# Patient Record
Sex: Male | Born: 1960
Health system: Southern US, Community
[De-identification: ages and names within clinical notes are randomized; demographics above are authoritative.]

## PROBLEM LIST (undated history)

## (undated) DIAGNOSIS — D126 Benign neoplasm of colon, unspecified: Secondary | ICD-10-CM

## (undated) DIAGNOSIS — I10 Essential (primary) hypertension: Secondary | ICD-10-CM

## (undated) DIAGNOSIS — Z8619 Personal history of other infectious and parasitic diseases: Secondary | ICD-10-CM

## (undated) DIAGNOSIS — IMO0001 Reserved for inherently not codable concepts without codable children: Secondary | ICD-10-CM

## (undated) DIAGNOSIS — K648 Other hemorrhoids: Secondary | ICD-10-CM

## (undated) DIAGNOSIS — F4323 Adjustment disorder with mixed anxiety and depressed mood: Secondary | ICD-10-CM

## (undated) DIAGNOSIS — R030 Elevated blood-pressure reading, without diagnosis of hypertension: Secondary | ICD-10-CM

## (undated) DIAGNOSIS — E7800 Pure hypercholesterolemia, unspecified: Secondary | ICD-10-CM

## (undated) DIAGNOSIS — M1990 Unspecified osteoarthritis, unspecified site: Secondary | ICD-10-CM

## (undated) HISTORY — PX: NASAL SEPTUM SURGERY: SHX37

## (undated) HISTORY — DX: Adjustment disorder with mixed anxiety and depressed mood: F43.23

## (undated) HISTORY — DX: Unspecified osteoarthritis, unspecified site: M19.90

## (undated) HISTORY — DX: Reserved for inherently not codable concepts without codable children: IMO0001

## (undated) HISTORY — DX: Benign neoplasm of colon, unspecified: D12.6

## (undated) HISTORY — DX: Elevated blood-pressure reading, without diagnosis of hypertension: R03.0

## (undated) HISTORY — DX: Personal history of other infectious and parasitic diseases: Z86.19

## (undated) HISTORY — DX: Other hemorrhoids: K64.8

## (undated) HISTORY — PX: ELBOW SURGERY: SHX618

## (undated) HISTORY — DX: Pure hypercholesterolemia, unspecified: E78.00

## (undated) HISTORY — PX: KNEE SURGERY: SHX244

## (undated) HISTORY — PX: PARTIAL HIP ARTHROPLASTY: SHX733

---

## 2009-11-23 ENCOUNTER — Telehealth: Payer: Self-pay | Admitting: Internal Medicine

## 2009-11-24 ENCOUNTER — Ambulatory Visit: Payer: Self-pay | Admitting: Internal Medicine

## 2009-11-24 DIAGNOSIS — E785 Hyperlipidemia, unspecified: Secondary | ICD-10-CM | POA: Insufficient documentation

## 2009-11-24 DIAGNOSIS — I10 Essential (primary) hypertension: Secondary | ICD-10-CM | POA: Insufficient documentation

## 2009-11-24 DIAGNOSIS — E119 Type 2 diabetes mellitus without complications: Secondary | ICD-10-CM

## 2009-11-24 DIAGNOSIS — Z794 Long term (current) use of insulin: Secondary | ICD-10-CM | POA: Insufficient documentation

## 2009-11-24 DIAGNOSIS — F4322 Adjustment disorder with anxiety: Secondary | ICD-10-CM | POA: Insufficient documentation

## 2009-11-24 DIAGNOSIS — G4700 Insomnia, unspecified: Secondary | ICD-10-CM | POA: Insufficient documentation

## 2009-11-24 LAB — CONVERTED CEMR LAB
Albumin: 4.7 g/dL (ref 3.5–5.2)
BUN: 14 mg/dL (ref 6–23)
CO2: 22 meq/L (ref 19–32)
Calcium: 9.6 mg/dL (ref 8.4–10.5)
Creatinine, Ser: 0.69 mg/dL (ref 0.40–1.50)
Glucose, Bld: 276 mg/dL — ABNORMAL HIGH (ref 70–99)
Hgb A1c MFr Bld: 11.5 % — ABNORMAL HIGH (ref <5.7)
Microalb, Ur: 3.02 mg/dL — ABNORMAL HIGH (ref 0.00–1.89)
TSH: 1.581 microintl units/mL (ref 0.350–4.500)
Total Bilirubin: 0.4 mg/dL (ref 0.3–1.2)
Total Protein: 7.1 g/dL (ref 6.0–8.3)

## 2009-11-24 LAB — HM DIABETES FOOT EXAM

## 2009-11-27 ENCOUNTER — Telehealth: Payer: Self-pay | Admitting: Internal Medicine

## 2009-11-28 ENCOUNTER — Encounter: Payer: Self-pay | Admitting: Internal Medicine

## 2009-11-28 LAB — CONVERTED CEMR LAB
Cholesterol: 168 mg/dL (ref 0–200)
VLDL: 50 mg/dL — ABNORMAL HIGH (ref 0–40)

## 2009-12-04 ENCOUNTER — Telehealth: Payer: Self-pay | Admitting: Internal Medicine

## 2009-12-05 ENCOUNTER — Telehealth: Payer: Self-pay | Admitting: Internal Medicine

## 2009-12-08 ENCOUNTER — Ambulatory Visit: Payer: Self-pay | Admitting: Internal Medicine

## 2009-12-08 ENCOUNTER — Telehealth: Payer: Self-pay | Admitting: Internal Medicine

## 2009-12-11 ENCOUNTER — Encounter: Payer: Self-pay | Admitting: Internal Medicine

## 2009-12-11 ENCOUNTER — Telehealth: Payer: Self-pay | Admitting: Internal Medicine

## 2009-12-13 ENCOUNTER — Encounter: Payer: Self-pay | Admitting: Internal Medicine

## 2009-12-18 ENCOUNTER — Ambulatory Visit: Payer: Self-pay | Admitting: Internal Medicine

## 2010-01-02 ENCOUNTER — Telehealth: Payer: Self-pay | Admitting: Internal Medicine

## 2010-01-04 ENCOUNTER — Encounter: Payer: Self-pay | Admitting: Internal Medicine

## 2010-01-05 ENCOUNTER — Ambulatory Visit: Payer: Self-pay | Admitting: Internal Medicine

## 2010-01-05 LAB — CONVERTED CEMR LAB
Chloride: 103 meq/L (ref 96–112)
Creatinine, Ser: 0.77 mg/dL (ref 0.40–1.50)
Creatinine, Urine: 22 mg/dL
Microalb Creat Ratio: 22.7 mg/g (ref 0.0–30.0)
Potassium: 4.4 meq/L (ref 3.5–5.3)
Sodium: 140 meq/L (ref 135–145)

## 2010-01-07 ENCOUNTER — Encounter: Payer: Self-pay | Admitting: Internal Medicine

## 2010-01-08 ENCOUNTER — Telehealth: Payer: Self-pay | Admitting: Internal Medicine

## 2010-02-07 ENCOUNTER — Telehealth: Payer: Self-pay | Admitting: Internal Medicine

## 2010-02-15 ENCOUNTER — Telehealth: Payer: Self-pay | Admitting: Internal Medicine

## 2010-02-16 ENCOUNTER — Ambulatory Visit
Admission: RE | Admit: 2010-02-16 | Discharge: 2010-02-16 | Payer: Self-pay | Source: Home / Self Care | Attending: Internal Medicine | Admitting: Internal Medicine

## 2010-03-06 ENCOUNTER — Telehealth: Payer: Self-pay | Admitting: Internal Medicine

## 2010-03-06 NOTE — Progress Notes (Signed)
Summary: Carbohyrdrate intake  Phone Note Call from Patient Call back at Home Phone [phone]   Caller: Patient Call For: [name] DO Summary of Call: patient called and left voice message wanting to know what his reccomended carbohydrate intake should be  Initial call taken by: Glendell Docker CMA,  November 27, 2009 2:38 PM  Follow-up for Phone Call        25 grams per meal.  If he follows this new diet,  I suggest he hold glipizide and cut his lantus dose in half Follow-up by: [name] DO,  November 27, 2009 2:53 PM  Additional Follow-up for Phone Call Additional follow up Details #1::        call returned to patient at [phone], he has been advised per Dr Artist Pais instructions. He states that he may have trouble maintaining 25 grams per meal. Are there any other suggestions. Right now  the average per meal is between 30 and 40 Additional Follow-up by: Glendell Docker CMA,  November 27, 2009 3:24 PM    Additional Follow-up for Phone Call Additional follow up Details #2::    ok for carb intake up to 30 grams per meal Follow-up by: [name] DO,  November 27, 2009 5:09 PM  Additional Follow-up for Phone Call Additional follow up Details #3:: Details for Additional Follow-up Action Taken: call returned to patient at [phone], no asnwer. A detailed voice message was left informing patient per Dr Artist Pais instructions. Message left for patient to call if any additional questions. Additional Follow-up by: Glendell Docker CMA,  November 28, 2009 8:20 AM  New/Updated Medications: LANTUS 100 UNIT/ML SOLN (INSULIN GLARGINE) inject 25 units subcutaneously every morning. GLIPIZIDE XL 10 MG XR24H-TAB (GLIPIZIDE) Take 1 tablet by mouth once a day ( on hold 11/27/2009)
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
## 2010-03-06 NOTE — Progress Notes (Signed)
Summary: rx for Apidra vials   Phone Note Refill Request Message from:  Pharmacy on January 02, 2010 8:25 AM  Refills Requested: Medication #1:  APIDRA SOLOSTAR 100 UNIT/ML SOLN use 5-15 units three times a day before meals   Dosage confirmed as above?Dosage Confirmed   Brand Name Necessary? No   Supply Requested: 1 month pharmacy is requesting apidra vial the solostar is on back order patient can get vials for free    Method Requested: Electronic Next Appointment Scheduled: 01-05-10 Dr Artist Pais  Initial call taken by: Roselle Locus,  January 02, 2010 8:26 AM Caller: Patient Call For: [name] DO  Follow-up for Phone Call        ok to change rx to apidra vials plz send rx for insulin syringes for three times a day use as well Follow-up by: [name] DO,  January 03, 2010 11:30 AM  Additional Follow-up for Phone Call Additional follow up Details #1::        Notified pt. He requests that a rx for Apidra solostar be kept on file at [facility] in case it ever comes off of back order. Spoke to  [place] the pharmacist at [facility] and gave auth to change to the vials. He states they will keep Solostar rx on file but they may need to contact to [place] for verbal auth if  back order is resolved in the future. Nicki Guadalajara Fergerson CMA Duncan Dull)  January 03, 2010 11:44 AM      Appended Document: rx for Apidra vials  Per Matt at [facility], pt would use 1 vial every 22 days if he took the maximum units prescribed.

## 2010-03-06 NOTE — Progress Notes (Signed)
Summary: Insulin questions  Phone Note Call from Patient Call back at Home Phone [phone]   Caller: Patient Call For: [name] DO Summary of Call: patient called and left voice message stating he has been experimenting with this Lantus and Apidra. He is requesting a return call.  Initial call taken by: Glendell Docker CMA,  December 11, 2009 12:02 PM  Follow-up for Phone Call        patient states that he has been using the playing with the dosing on his Lantus and Apidra and wanted to know what his blood sugars should be. He state right now low 99 high of 245. He would also like to know if he could have refills on his Metformin and Simvastatin Follow-up by: Glendell Docker CMA,  December 11, 2009 2:14 PM  Additional Follow-up for Phone Call Additional follow up Details #1::        see rx for metformin I suggest he make appt next week if he has questions re:  insulin use I suggest he bring glucomter and food log Additional Follow-up by: [name] DO,  December 11, 2009 4:13 PM    Additional Follow-up for Phone Call Additional follow up Details #2::    call returned to patient at [phone], he has been advised per Dr Artist Pais instructions. Appointment scheduled for Monday 11/14 @ 11am Follow-up by: Glendell Docker CMA,  December 11, 2009 4:51 PM  Prescriptions: METFORMIN HCL 500 MG XR24H-TAB (METFORMIN HCL) 2 tabs by mouth once daily  #60 x 5   Entered and Authorized by:   [name] DO   Signed by:   [name] DO on 12/11/2009   Method used:   Electronically to        [facility] (retail)       [address]       [place], [place]  [address]       Ph: [phone]       Fax: [phone]   RxID:   [id number]
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
## 2010-03-06 NOTE — Letter (Signed)
Summary: [facility] for Medical Supplies/[facility]  [facility]N for Medical Supplies/[facility]   Imported By: Lanelle Bal 12/22/2009 11:26:58  _____________________________________________________________________  External Attachment:    Type:   Image     Comment:   External Document

## 2010-03-06 NOTE — Progress Notes (Signed)
Summary: FAXED REQUEST FOR RECORDS   Phone Note Outgoing Call   Call placed by: [facility] Call placed to: [facility]  Summary of Call: FAXED REQUEST FOR RECORDS  Initial call taken by: Roselle Locus,  November 23, 2009 11:17 AM
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
## 2010-03-06 NOTE — Letter (Signed)
   [place] [facility] [address] [place], [place]  [address]  [place] Phone: [phone]      January 07, 2010   Hitoshi Althaus [address] [place], [place] [address]  RE:  LAB RESULTS  Dear  Mr. INABINET,  The following is an interpretation of your most recent lab tests.  Please take note of any instructions provided or changes to medications that have resulted from your lab work.  ELECTROLYTES:  Good - no changes needed  KIDNEY FUNCTION TESTS:  Good - no changes needed    DIABETIC STUDIES:  Improved - continue management Blood Glucose: 164   HgbA1C: 8.2   Microalbumin/Creatinine Ratio: 22.7          Sincerely Yours,    Dr. Thomos Lemons  Appended Document:  mailed
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
## 2010-03-06 NOTE — Progress Notes (Signed)
Summary: Apidra Refill  Phone Note Refill Request Message from:  Fax from Pharmacy on January 08, 2010 8:20 AM  Refills Requested: Medication #1:  apidra u 100 inj sano patient needs 2 vials 1 vial only last 22 days please re send stating 2 vials is ok to dispense  [facility] [address] [place] [address] fax [phone]   Method Requested: Electronic Next Appointment Scheduled: 01-08-2010 Dr Artist Pais  Initial call taken by: Roselle Locus,  January 08, 2010 8:21 AM  Follow-up for Phone Call        call placed to patient at [phone], no answer. A voice message was left for patient to return call with  the number of units of Apidra he is currently injecting.  Follow-up by: Glendell Docker CMA,  January 08, 2010 9:35 AM  Additional Follow-up for Phone Call Additional follow up Details #1::        Pt returned call and states that some times he takes a max of 45 units a day. States the pharmacist originally calculated dosage wrong and did not dispense enough vials. Spoke to [place] [facility] and she states 1 vial will last pt 22 days. Gave auth to dispense #2 vials. Nicki Guadalajara Fergerson CMA Duncan Dull)  January 08, 2010 10:33 AM

## 2010-03-06 NOTE — Progress Notes (Signed)
Summary: Medication Refills  Phone Note From Pharmacy   Caller: [facility] [phone] Summary of Call: Matt called stating patient is requesting a rx for Ramipril and Simvastatin. He states they do not have rx's on file for this patient Initial call taken by: Glendell Docker CMA,  December 08, 2009 4:28 PM  Follow-up for Phone Call        Phone call completed Follow-up by: Glendell Docker CMA,  December 11, 2009 4:48 PM    New/Updated Medications: SIMVASTATIN 40 MG TABS (SIMVASTATIN) one by mouth q pm Prescriptions: SIMVASTATIN 40 MG TABS (SIMVASTATIN) one by mouth q pm  #30 x 5   Entered and Authorized by:   [name] DO   Signed by:   [name] DO on 12/08/2009   Method used:   Electronically to        [facility] (retail)       [address]       [place], [place]  [address]       Ph: [phone]       Fax: [phone]   RxID:   [id number] RAMIPRIL 10 MG CAPS (RAMIPRIL) Take 2 tablets daily.  #60 x 5   Entered and Authorized by:   [name] DO   Signed by:   [name] DO on 12/08/2009   Method used:   Electronically to        [facility] (retail)       [address]       [place], [place]  [address]       Ph: [phone]       Fax: [phone]   RxID:   [id number]

## 2010-03-06 NOTE — Progress Notes (Signed)
Summary: William [name]  Phone Note Call from Patient Call back at [phone]   Caller: Patient Call For: [name] DO Summary of Call: Received call from pt requesting a prescription be sent to [facility] for a OneTouch UltraMini [name]. Pt has a voucher that will get him the [name] for free. Pt states this one is smaller than his current OneTouch [name] and will be better to use when traveling. Pt states he does not need glucometer strips at this time. Please let pt know when Rx has been approved.  Nicki Guadalajara Fergerson CMA Duncan Dull)  January 02, 2010 8:35 AM   Follow-up for Phone Call        ok to send rx Follow-up by: [name] DO,  January 02, 2010 5:11 PM  Additional Follow-up for Phone Call Additional follow up Details #1::        How many times a day should pt check his blood sugar? Nicki Guadalajara Fergerson CMA (AAMA)  January 03, 2010 8:16 AM   up to 4 x daily [name] DO  January 03, 2010 11:29 AM     Additional Follow-up for Phone Call Additional follow up Details #2::    Rx faxed to pharmacy. Pt has been notified. Nicki Guadalajara Fergerson CMA Duncan Dull)  January 03, 2010 11:38 AM   New/Updated Medications: * ONETOUCH ULTRA MINI GLUCOMETER Check blood sugar up to 4 times a day. Prescriptions: ONETOUCH ULTRA MINI GLUCOMETER Check blood sugar up to 4 times a day.  #1 kit x 0   Entered by:   Mervin Kung CMA (AAMA)   Authorized by:   [name] DO   Signed by:   Mervin Kung CMA (AAMA) on 01/03/2010   Method used:   Faxed to ...       [facility] (retail)       [address]       [place], [place]  [address]       Ph: [phone]       Fax: [phone]   RxID:   [id number]

## 2010-03-06 NOTE — Progress Notes (Signed)
Summary: records rec from [facility]   Phone Note Other Incoming   Caller: [facility]  Summary of Call: records rec from [facility]  Initial call taken by: Roselle Locus,  December 05, 2009 10:07 AM

## 2010-03-06 NOTE — Letter (Signed)
Summary: Glucose Log from Patient  Glucose Log from Patient   Imported By: Lanelle Bal 12/20/2009 09:39:08  _____________________________________________________________________  External Attachment:    Type:   Image     Comment:   External Document

## 2010-03-08 NOTE — Assessment & Plan Note (Signed)
Summary: 6 week follow up/mhf   Vital Signs:  Patient profile:   50 year old male Height:      72 inches Weight:      217 pounds BMI:     29.54 O2 Sat:      97 % on Room air Temp:     97.8 degrees F oral Pulse rate:   86 / minute Resp:     18 per minute BP sitting:   110 / 68  (right arm) Cuff size:   large  Vitals Entered By: Glendell Docker CMA (February 16, 2010 3:08 PM)  O2 Flow:  Room air CC: 6  Week Follow up  Is Patient Diabetic? Yes Did you bring your meter with you today? No Pain Assessment Patient in pain? no      Comments would like an increase on Bupropian, refill on Alprazolam, discuss pain in left arm for the past month   Primary Care Provider:  Dondra Spry DO  CC:  6  Week Follow up .  History of Present Illness: 50 y/o white male f/u pt feels like buproprion helping with stress level he has taken x 1 month wife had noticed pt less intense  DM II - somewhat labile blood sugars.  pt occ using shorting acting insulin after meals   Preventive Screening-Counseling & Management  Alcohol-Tobacco     Smoking Status: never  Allergies (verified): No Known Drug Allergies  Past History:  Past Medical History: Hx or Asthma Arthritis Hx of chicken pox     Adjustment disorder depression/anxiety DM II Hx of High Blood pressure readings Hypercholesterolemia  Physical Exam  General:  alert, well-developed, and well-nourished.   Lungs:  normal respiratory effort, normal breath sounds, no crackles, and no wheezes.   Heart:  normal rate, regular rhythm, no murmur, and no gallop.   Psych:  normally interactive and good eye contact.     Impression & Recommendations:  Problem # 1:  ADJUSTMENT DISORDER WITH ANXIOUS MOOD (ICD-309.24) Assessment Improved continue wellburtrin - good response continue alprazolam as needed  Problem # 2:  DIABETES-TYPE 2 (ICD-250.00) Assessment: Deteriorated pt advised not to use apidra after meals to "chase" elevated  CBG.  avoid hypoglycemia  His updated medication list for this problem includes:    Lantus Solostar 100 Unit/ml Soln (Insulin glargine) ..... Use 35 units once daily    Ramipril 10 Mg Caps (Ramipril) .[name]... Take 2 tablets daily.    Metformin Hcl 500 Mg Xr24h-tab (Metformin hcl) .[name]... 2 tabs by mouth in am , then 1 tab pm    Apidra Solostar 100 Unit/ml Soln (Insulin glulisine) ..... Use 10-15 units three times a day before meals  Complete Medication List: 1)  Lantus Solostar 100 Unit/ml Soln (Insulin glargine) .... Use 35 units once daily 2)  Ramipril 10 Mg Caps (Ramipril) .... Take 2 tablets daily. 3)  Alprazolam 1 Mg Tabs (Alprazolam) .... Take 1 tablet by mouth at bedtime as needed 4)  Simvastatin 40 Mg Tabs (Simvastatin) .... One by mouth q pm 5)  Metformin Hcl 500 Mg Xr24h-tab (Metformin hcl) .... 2 tabs by mouth in am , then 1 tab pm 6)  Apidra Solostar 100 Unit/ml Soln (Insulin glulisine) .... Use 10-15 units three times a day before meals 7)  Relion Pen Needles 31g X 8 Mm Misc (Insulin pen needle) .... Use 4 x daily as directed 8)  Onetouch Ultra Mini Glucometer  .... Check blood sugar up to 4 times a day. 9)  Bupropion Hcl 150 Mg Xr24h-tab (Bupropion hcl) .... One by mouth once daily  Patient Instructions: 1)  Please schedule a follow-up appointment in 2 months. 2)  BMP prior to visit, ICD-9:  250.02 3)  HbgA1C prior to visit, ICD-9: 250.02 4)  Please return for lab work one (1) week before your next appointment.  Prescriptions: ALPRAZOLAM 1 MG TABS (ALPRAZOLAM) Take 1 tablet by mouth at bedtime as needed  #30 x 2   Entered and Authorized by:   [name] DO   Signed by:   [name] DO on 02/16/2010   Method used:   Print then Give to Patient   RxID:   [id number]    Orders Added: 1)  Est. Patient Level III [53664]    Current Allergies (reviewed today): No known allergies

## 2010-03-08 NOTE — Progress Notes (Signed)
Summary: Alprazolam Refill  Phone Note Refill Request Message from:  Pharmacy on February 07, 2010 11:47 AM  Refills Requested: Medication #1:  ALPRAZOLAM 1 MG TABS Take 1 tablet by mouth at bedtime as needed   Dosage confirmed as above?Dosage Confirmed   Brand Name Necessary? No   Supply Requested: 1 month   Last Ref[facility]: [facility] [address] [place] fax [phone]   Method Requested: Electronic Next Appointment Scheduled: none Initial call taken by: Roselle Locus,  February 07, 2010 11:48 AM  Follow-up for Phone Call        pt was given rx for alprazolam 12/2 with 1 refill. why is he requesting early refill Follow-up by: [name] DO,  February 07, 2010 12:36 PM  Additional Follow-up for Phone Call Additional follow up Details #1::        call placed to patient at [phone], no answer. A detailed voice message was left for patient informing him per Dr Artist Pais instructions. Message was left for patient to return call with the requested information Additional Follow-up by: Glendell Docker CMA,  February 07, 2010 2:07 PM    Additional Follow-up for Phone Call Additional follow up Details #2::    patient returned phone call. Patient states that he has 19 pills remaining, and states that he will need another rx. He states that he will be leaving for [place], and wanted to  make sure that he has all of his needed medication. He was informed his rx would  not refill if he is not due for a refill. He states that he usually has all of medication filled at the beginning of the month, and he will follow up with Dr Artist Pais regarding this on his appointment scheduled for the January 19th. He wanted to make Dr Artist Pais aware that he was not doubling up on the medication. Follow-up by: Glendell Docker CMA,  February 07, 2010 4:23 PM

## 2010-03-08 NOTE — Letter (Signed)
Summary: CMN for Glucometer & Supplies/[facility]  CMN for Glucometer [facility] Supplies/[facility]   Imported By: Sherian Rein 01/15/2010 14:36:56  _____________________________________________________________________  External Attachment:    Type:   Image     Comment:   External Document

## 2010-03-08 NOTE — Progress Notes (Signed)
Summary: Alprazolam Refill  Phone Note Refill Request Message from:  Fax from Pharmacy on February 15, 2010 8:23 AM  Refills Requested: Medication #1:  ALPRAZOLAM 1 MG TABS Take 1 tablet by mouth at bedtime as needed   Dosage confirmed as above?Dosage Confirmed   Brand Name Necessary? No   Supply Requested: 1 month   [facility]: [facility]  [address] [place] [address] fax [phone]   Method Requested: Electronic Next Appointment Scheduled: 02-16-10 Dr Artist Pais  Initial call taken by: Roselle Locus,  February 15, 2010 8:23 AM  Follow-up for Phone Call        we will discuss refill at [facility] Follow-up by: [name] DO,  February 15, 2010 5:05 PM
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
## 2010-03-14 NOTE — Progress Notes (Signed)
Summary: Generic Zoloft  Phone Note Call from Patient Call back at Home Phone [phone] Call back at [phone]   Caller: Patient Call For: [name] DO Summary of Call: faxed received from patient stating at last visit, it was dicussed supplementing the Wellbutrin with a regimen of generic Zoloft. He would like to proceed with that, and request a rx be sent to  [facility] on [facility]. Initial call taken by: Glendell Docker CMA,  March 06, 2010 9:03 AM  Follow-up for Phone Call        see rx for sertraline pt needs OV within 2 weeks of startin medication Follow-up by: [name] DO,  March 06, 2010 4:31 PM  Additional Follow-up for Phone Call Additional follow up Details #1::        call placed to patient at [phone], no answer. A detailed voice message was left informing patient rx sent to pharmacy  Patient called back, he has been advised per Dr Artist Pais instructions Additional Follow-up by: Glendell Docker CMA,  March 06, 2010 4:52 PM    New/Updated Medications: SERTRALINE HCL 25 MG TABS (SERTRALINE HCL) 1/2 by mouth once daily x 7 days, then 1 by mouth once daily Prescriptions: SERTRALINE HCL 25 MG TABS (SERTRALINE HCL) 1/2 by mouth once daily x 7 days, then 1 by mouth once daily  #30 x 1   Entered and Authorized by:   [name] DO   Signed by:   [name] DO on 03/06/2010   Method used:   Electronically to        [facility] (retail)       [address]       [place], [place]  [address]       Ph: [phone]       Fax: [phone]   RxID:   [id number]

## 2010-04-09 ENCOUNTER — Encounter: Payer: Self-pay | Admitting: Internal Medicine

## 2010-04-09 LAB — CONVERTED CEMR LAB
Chloride: 100 meq/L (ref 96–112)
Glucose, Bld: 156 mg/dL — ABNORMAL HIGH (ref 70–99)
Hgb A1c MFr Bld: 7.2 % — ABNORMAL HIGH (ref <5.7)
Potassium: 4.5 meq/L (ref 3.5–5.3)
Sodium: 139 meq/L (ref 135–145)

## 2010-04-16 ENCOUNTER — Ambulatory Visit (INDEPENDENT_AMBULATORY_CARE_PROVIDER_SITE_OTHER): Payer: BC Managed Care – PPO | Admitting: Internal Medicine

## 2010-04-16 ENCOUNTER — Encounter: Payer: Self-pay | Admitting: Internal Medicine

## 2010-04-16 DIAGNOSIS — G4700 Insomnia, unspecified: Secondary | ICD-10-CM

## 2010-04-16 DIAGNOSIS — K0500 Acute gingivitis, plaque induced: Secondary | ICD-10-CM

## 2010-04-16 DIAGNOSIS — F4322 Adjustment disorder with anxiety: Secondary | ICD-10-CM

## 2010-04-16 DIAGNOSIS — E119 Type 2 diabetes mellitus without complications: Secondary | ICD-10-CM

## 2010-04-16 DIAGNOSIS — Z23 Encounter for immunization: Secondary | ICD-10-CM

## 2010-04-16 DIAGNOSIS — I10 Essential (primary) hypertension: Secondary | ICD-10-CM

## 2010-04-17 NOTE — Miscellaneous (Signed)
Summary: Orders Update  Clinical Lists Changes  Orders: Added new Test order of T-Basic Metabolic Panel ([id number]) - Signed Added new Test order of T- Hemoglobin A1C (83036-23375) - Signed 

## 2010-04-20 ENCOUNTER — Ambulatory Visit: Payer: Self-pay | Admitting: Internal Medicine

## 2010-04-26 ENCOUNTER — Telehealth: Payer: Self-pay | Admitting: Internal Medicine

## 2010-04-26 DIAGNOSIS — F4322 Adjustment disorder with anxiety: Secondary | ICD-10-CM

## 2010-04-27 ENCOUNTER — Other Ambulatory Visit: Payer: Self-pay | Admitting: Internal Medicine

## 2010-04-27 ENCOUNTER — Encounter: Payer: Self-pay | Admitting: Internal Medicine

## 2010-04-27 DIAGNOSIS — E119 Type 2 diabetes mellitus without complications: Secondary | ICD-10-CM

## 2010-04-27 MED ORDER — ALPRAZOLAM 1 MG PO TABS: 1.0000 mg | ORAL_TABLET | Freq: Every evening | ORAL | Status: DC | PRN

## 2010-04-27 NOTE — Telephone Encounter (Signed)
Refill request for Xanax, is it okay to refill?

## 2010-04-27 NOTE — Telephone Encounter (Signed)
rx printed and faxed to [facility]

## 2010-04-27 NOTE — Telephone Encounter (Signed)
Ok to refill x 3 

## 2010-04-27 NOTE — Telephone Encounter (Signed)
rx printed and faxed to [facility]

## 2010-05-02 ENCOUNTER — Telehealth: Payer: Self-pay | Admitting: Internal Medicine

## 2010-05-02 DIAGNOSIS — E119 Type 2 diabetes mellitus without complications: Secondary | ICD-10-CM

## 2010-05-02 MED ORDER — INSULIN GLULISINE 100 UNIT/ML ~~LOC~~ SOLN: 15.0000 [IU] | Freq: Three times a day (TID) | SUBCUTANEOUS | Status: DC

## 2010-05-02 NOTE — Telephone Encounter (Signed)
rx sent to pharmacy

## 2010-05-02 NOTE — Telephone Encounter (Signed)
Refill- apidra u-100 inj sano. Inject 5-15 units subcutaneously three times daily before meals. Qty 30. Last fill 3.12.12

## 2010-05-04 NOTE — Telephone Encounter (Signed)
rx refill already sent to pharmacy

## 2010-05-05 ENCOUNTER — Other Ambulatory Visit: Payer: Self-pay | Admitting: Internal Medicine

## 2010-05-05 DIAGNOSIS — E119 Type 2 diabetes mellitus without complications: Secondary | ICD-10-CM

## 2010-05-07 NOTE — Telephone Encounter (Signed)
rx refill sent to pharmacy 
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
## 2010-05-16 ENCOUNTER — Ambulatory Visit (INDEPENDENT_AMBULATORY_CARE_PROVIDER_SITE_OTHER): Payer: BC Managed Care – PPO | Admitting: *Deleted

## 2010-05-16 ENCOUNTER — Ambulatory Visit: Payer: BC Managed Care – PPO

## 2010-05-16 DIAGNOSIS — Z23 Encounter for immunization: Secondary | ICD-10-CM

## 2010-05-17 ENCOUNTER — Ambulatory Visit: Payer: BC Managed Care – PPO

## 2010-06-05 ENCOUNTER — Other Ambulatory Visit: Payer: Self-pay | Admitting: Internal Medicine

## 2010-07-11 ENCOUNTER — Telehealth: Payer: Self-pay | Admitting: Internal Medicine

## 2010-07-11 DIAGNOSIS — E119 Type 2 diabetes mellitus without complications: Secondary | ICD-10-CM

## 2010-07-11 DIAGNOSIS — I10 Essential (primary) hypertension: Secondary | ICD-10-CM

## 2010-07-11 LAB — HEMOGLOBIN A1C
Hgb A1c MFr Bld: 7.1 % — ABNORMAL HIGH (ref <5.7)
Mean Plasma Glucose: 157 mg/dL — ABNORMAL HIGH (ref <117)

## 2010-07-11 LAB — BASIC METABOLIC PANEL
BUN: 23 mg/dL (ref 6–23)
Chloride: 104 mEq/L (ref 96–112)
Potassium: 4.5 mEq/L (ref 3.5–5.3)
Sodium: 137 mEq/L (ref 135–145)

## 2010-07-11 NOTE — Telephone Encounter (Signed)
Patient is in the lab and Katrina needs an order

## 2010-07-11 NOTE — Telephone Encounter (Signed)
Labs have been entered, order sent

## 2010-07-12 LAB — MICROALBUMIN / CREATININE URINE RATIO
Creatinine, Urine: 146.7 mg/dL
Microalb Creat Ratio: 8.9 mg/g (ref 0.0–30.0)
Microalb, Ur: 1.3 mg/dL (ref 0.00–1.89)

## 2010-07-13 ENCOUNTER — Encounter: Payer: Self-pay | Admitting: Family

## 2010-07-13 ENCOUNTER — Telehealth: Payer: Self-pay | Admitting: Internal Medicine

## 2010-07-13 NOTE — Telephone Encounter (Signed)
Patient refuses to see another doctor. Original appt was on 07-16-10, rescheduled to 08-13-10.

## 2010-07-13 NOTE — Telephone Encounter (Signed)
His labs are stable. He can wait for Dr Artist Pais unless he is feeling poorly. He should probably know that Alprazolam is a controlled substance so we will not be able to refill this indefinitely without someone seeing him

## 2010-07-16 ENCOUNTER — Ambulatory Visit: Payer: BC Managed Care – PPO | Admitting: Internal Medicine

## 2010-07-17 ENCOUNTER — Telehealth: Payer: Self-pay | Admitting: *Deleted

## 2010-07-17 NOTE — Telephone Encounter (Signed)
Patient called and left voice message requesting the results of his A1C (7.1).   Call was returned to patient at [phone], no answer.  A detailed voice message was left informing patient of lab results. He was advised to call back if any questions

## 2010-08-13 ENCOUNTER — Other Ambulatory Visit: Payer: Self-pay | Admitting: Internal Medicine

## 2010-08-13 ENCOUNTER — Ambulatory Visit: Payer: BC Managed Care – PPO | Admitting: Internal Medicine

## 2010-08-14 NOTE — Telephone Encounter (Signed)
Left message telling patient that #30 day refill was sent to pharmacy but that he does need to make a follow up appt.

## 2010-08-14 NOTE — Telephone Encounter (Signed)
Patient called back and made appt for 08-20-10

## 2010-08-14 NOTE — Telephone Encounter (Signed)
Refill sent for sertraline #30 x no refills. Pt was due for f/u in June. Please call pt and arrange f/u with Dr. Rodena Medin if pt agreeable.

## 2010-08-15 ENCOUNTER — Telehealth: Payer: Self-pay | Admitting: Internal Medicine

## 2010-08-15 MED ORDER — INSULIN GLARGINE 100 UNIT/ML ~~LOC~~ SOLN: 35.0000 [IU] | Freq: Every day | SUBCUTANEOUS | Status: DC

## 2010-08-15 NOTE — Telephone Encounter (Signed)
Refill- lantus solostar inj sano. Inject 35 units once daily. Qty 15. Last fill 5.22.12  Comments: patient would prefer the lantus in vials.

## 2010-08-15 NOTE — Telephone Encounter (Signed)
Left message for pt to call and let me know if he currently has insulin syringes. Spoke to pharmacist and gave verbal auth to change to Lantus vials, dispense 1 month supply x no refills. She states that pt is currently using syringes for Apidra vials.

## 2010-08-15 NOTE — Telephone Encounter (Signed)
Please advise if ok to change Rx to vials and syringes.

## 2010-08-15 NOTE — Telephone Encounter (Signed)
OK to change to vials and syringes.  Pls arrange nurse visit for teaching if he has not measured insulin before.

## 2010-08-16 ENCOUNTER — Telehealth: Payer: Self-pay | Admitting: Internal Medicine

## 2010-08-16 NOTE — Telephone Encounter (Signed)
HE IS OUT OF THE ALPRAZOLAM 1 MG AND WOULD LIKE THE PHARMACY TO HAVE AN OK TO FILL A FEW DAY EARLY.  [facility]  PLEASE ADVISE PATIENT

## 2010-08-16 NOTE — Telephone Encounter (Signed)
FYI:   Spoke to pt re: need for early refill. He states he probably miscalculated his doses and he will be 3 days short as he only has 3 tabs on hand. States he last filled med on 07/22/10. Sp[facility] to pharmacis at [facility] and she states they will be able to fill Rx for pt tomorrow. Originally picked up #30 on 05/17/10, 06/19/10 and 07/22/10. Pt has 1 refill on file and will not need early authorization based on info from pharmacy.

## 2010-08-16 NOTE — Telephone Encounter (Signed)
Pt notified and confirmed that he has used vials and syringes before.

## 2010-08-16 NOTE — Telephone Encounter (Signed)
Patient returned phone call. Best # [phone]

## 2010-08-17 ENCOUNTER — Telehealth: Payer: Self-pay | Admitting: *Deleted

## 2010-08-17 NOTE — Telephone Encounter (Signed)
Advised pt that [name] cannot assist in re: to his request. He will need to discuss with Dr Rodena Medin at his follow up on Monday. Pt is satisfied with this decision.

## 2010-08-17 NOTE — Telephone Encounter (Signed)
Received call from pt that pharmacy told him insurance will only allow him to have 1 vail of insulin for 28 days with directions of 35 units daily. Pt is upset that he will not be able to get 2 vials of insulin for the 30 day supply. He states he will be out of insulin before the 30 days is up and is upset that his insurance will not allow for the additional vial. Pt asks that directions be written to qualify him for the additional vial per month (35-40 units daily on a sliding scale)? Please advise.

## 2010-08-20 ENCOUNTER — Encounter: Payer: Self-pay | Admitting: Internal Medicine

## 2010-08-20 ENCOUNTER — Ambulatory Visit (INDEPENDENT_AMBULATORY_CARE_PROVIDER_SITE_OTHER): Payer: BC Managed Care – PPO | Admitting: Internal Medicine

## 2010-08-20 VITALS — BP 116/82 | HR 88 | Temp 97.8°F | Ht 72.0 in | Wt 217.0 lb

## 2010-08-20 DIAGNOSIS — M25522 Pain in left elbow: Secondary | ICD-10-CM

## 2010-08-20 DIAGNOSIS — M25529 Pain in unspecified elbow: Secondary | ICD-10-CM

## 2010-08-20 DIAGNOSIS — E119 Type 2 diabetes mellitus without complications: Secondary | ICD-10-CM

## 2010-08-20 DIAGNOSIS — E785 Hyperlipidemia, unspecified: Secondary | ICD-10-CM

## 2010-08-20 DIAGNOSIS — Z125 Encounter for screening for malignant neoplasm of prostate: Secondary | ICD-10-CM

## 2010-08-20 MED ORDER — OXYCODONE-ACETAMINOPHEN 5-325 MG PO TABS: 1.0000 | ORAL_TABLET | Freq: Four times a day (QID) | ORAL | Status: DC | PRN

## 2010-08-20 MED ORDER — INSULIN GLARGINE 100 UNIT/ML ~~LOC~~ SOLN: 46.0000 [IU] | Freq: Every day | SUBCUTANEOUS | Status: DC

## 2010-08-21 ENCOUNTER — Telehealth: Payer: Self-pay | Admitting: *Deleted

## 2010-08-21 DIAGNOSIS — Z125 Encounter for screening for malignant neoplasm of prostate: Secondary | ICD-10-CM

## 2010-08-21 DIAGNOSIS — E785 Hyperlipidemia, unspecified: Secondary | ICD-10-CM

## 2010-08-21 LAB — LIPID PANEL
HDL: 27 mg/dL — ABNORMAL LOW (ref >39)
Total CHOL/HDL Ratio: 6.6 Ratio

## 2010-08-21 NOTE — Telephone Encounter (Signed)
Pt presented to the lab today for blood work. Future orders were released and faxed to the lab.

## 2010-08-22 LAB — HEPATIC FUNCTION PANEL
Albumin: 4.5 g/dL (ref 3.5–5.2)
Alkaline Phosphatase: 80 U/L (ref 39–117)
Bilirubin, Direct: 0.1 mg/dL (ref 0.0–0.3)
Total Bilirubin: 0.3 mg/dL (ref 0.3–1.2)

## 2010-08-22 LAB — PSA: PSA: 0.44 ng/mL (ref <=4.00)

## 2010-08-26 DIAGNOSIS — M25522 Pain in left elbow: Secondary | ICD-10-CM | POA: Insufficient documentation

## 2010-08-26 NOTE — Assessment & Plan Note (Signed)
Stable. Obtain fasting lipid profile and liver function tests 

## 2010-08-26 NOTE — Assessment & Plan Note (Signed)
Improving control. Discussed titration of Lantus 3 units every 3 days until fasting blood sugars consistently less than 130 without hypoglycemia. Encouraged regular aerobic exercise.

## 2010-08-26 NOTE — Assessment & Plan Note (Signed)
Status post surgery. Suboptimal control pain. Stop Vicodin. Begin Percocet short-term p.r.n.

## 2010-08-26 NOTE — Progress Notes (Signed)
  Subjective:    Patient ID: William [name], male    DOB: 1960/10/24, 50 y.o.   MRN: [id number]  HPI Patient presents to clinic for evaluation of multiple medical problems. History diabetes mellitus with fasting blood sugars above 130 and peak of 185. No hypoglycemia.reviewed most recent A1c 7.1 consistently improved from previous. Blood pressure reviewed as normal. Tolerate statin therapy without myalgias or abnormal LFTs. Has history of left elbow pain status post recent surgery with suboptimal control pain with Vicodin.no exacerbating or alleviating factors. No other complaints.  Reviewed past medical history, medications and allergies  Review of Systems  Constitutional: Negative for fever, chills and fatigue.  Respiratory: Negative for chest tightness and shortness of breath.   Cardiovascular: Negative for chest pain and palpitations.       Objective:   Physical Exam  Nursing note and vitals reviewed. Constitutional: He appears well-developed and well-nourished. No distress.  HENT:  Head: Normocephalic and atraumatic.  Eyes: No scleral icterus.  Neurological: He is alert.  Skin: Skin is warm and dry. He is not diaphoretic.  Psychiatric: He has a normal mood and affect.          Assessment & Plan:

## 2010-08-27 ENCOUNTER — Other Ambulatory Visit: Payer: Self-pay | Admitting: Internal Medicine

## 2010-08-27 MED ORDER — CHOLINE FENOFIBRATE 135 MG PO CPDR: 135.0000 mg | DELAYED_RELEASE_CAPSULE | Freq: Every day | ORAL | Status: DC

## 2010-09-12 ENCOUNTER — Telehealth: Payer: Self-pay | Admitting: Internal Medicine

## 2010-09-12 MED ORDER — BUPROPION HCL ER (XL) 150 MG PO TB24: 150.0000 mg | ORAL_TABLET | Freq: Every day | ORAL | Status: DC

## 2010-09-12 NOTE — Telephone Encounter (Signed)
Refill- wellbutrin xl 150mg  btap. Take one tablet by mouth one time daily. Qty 30. Last fill 7.9.12

## 2010-09-12 NOTE — Telephone Encounter (Signed)
Rx refill sent to pharmacy. 

## 2010-09-21 ENCOUNTER — Telehealth: Payer: Self-pay | Admitting: Internal Medicine

## 2010-09-21 ENCOUNTER — Other Ambulatory Visit: Payer: Self-pay | Admitting: Internal Medicine

## 2010-09-21 DIAGNOSIS — F4322 Adjustment disorder with anxiety: Secondary | ICD-10-CM

## 2010-09-21 MED ORDER — RAMIPRIL 10 MG PO CAPS: ORAL_CAPSULE | ORAL | Status: DC

## 2010-09-21 MED ORDER — SIMVASTATIN 40 MG PO TABS: 40.0000 mg | ORAL_TABLET | Freq: Every day | ORAL | Status: DC

## 2010-09-21 MED ORDER — ALPRAZOLAM 1 MG PO TABS: 1.0000 mg | ORAL_TABLET | Freq: Every evening | ORAL | Status: DC | PRN

## 2010-09-21 MED ORDER — SERTRALINE HCL 50 MG PO TABS: 50.0000 mg | ORAL_TABLET | Freq: Every day | ORAL | Status: DC

## 2010-09-21 NOTE — Telephone Encounter (Signed)
Ok to fill 6months except xanax 1 month with rf 1

## 2010-09-21 NOTE — Telephone Encounter (Signed)
Ar refills okay for this patient

## 2010-09-21 NOTE — Telephone Encounter (Signed)
Rx refill for xanax called to [phone], to pharmacist Cala Bradford

## 2010-09-21 NOTE — Telephone Encounter (Signed)
Refills-   Zocor 40mg  tab merc. Take one tablet by mouth one time daily in the pm. Qty 30. Last fill 7.9.12  Altace 10mg  cap mona. Take two capsules by mouth daily. Qty 60. Last fill 7.9.12  Zoloft 50mg  tab pfiz. Take one tablet by mouth one time daily. Qty 30. Last fill 7.10.12  Xanax 1mg  tab pfiz. Take one tablet by mouth at bed time as needed for sleep. Qty 30. Last fill 7.12.12

## 2010-10-15 ENCOUNTER — Ambulatory Visit (INDEPENDENT_AMBULATORY_CARE_PROVIDER_SITE_OTHER): Payer: BC Managed Care – PPO | Admitting: Internal Medicine

## 2010-10-15 ENCOUNTER — Telehealth: Payer: Self-pay | Admitting: Internal Medicine

## 2010-10-15 ENCOUNTER — Encounter: Payer: Self-pay | Admitting: Internal Medicine

## 2010-10-15 DIAGNOSIS — Z23 Encounter for immunization: Secondary | ICD-10-CM

## 2010-10-15 DIAGNOSIS — G4700 Insomnia, unspecified: Secondary | ICD-10-CM

## 2010-10-15 DIAGNOSIS — E785 Hyperlipidemia, unspecified: Secondary | ICD-10-CM

## 2010-10-15 DIAGNOSIS — E119 Type 2 diabetes mellitus without complications: Secondary | ICD-10-CM

## 2010-10-15 LAB — BASIC METABOLIC PANEL
CO2: 25 mEq/L (ref 19–32)
Calcium: 9.8 mg/dL (ref 8.4–10.5)
Chloride: 102 mEq/L (ref 96–112)
Glucose, Bld: 179 mg/dL — ABNORMAL HIGH (ref 70–99)
Sodium: 138 mEq/L (ref 135–145)

## 2010-10-15 MED ORDER — FENOFIBRATE 145 MG PO TABS: 145.0000 mg | ORAL_TABLET | Freq: Every day | ORAL | Status: DC

## 2010-10-15 MED ORDER — ALPRAZOLAM 1 MG PO TABS: 2.0000 mg | ORAL_TABLET | Freq: Every evening | ORAL | Status: DC | PRN

## 2010-10-15 NOTE — Telephone Encounter (Signed)
PATIENT STATES DR HODGIN GAVE HIM NEW CHOLESTEROL MED AND TOLD HIM TO HAVE BLOOD WORK THE WEEK PRIOR TO HIS December 10TH APPT PLEASE FAX AN ORDER

## 2010-10-15 NOTE — Progress Notes (Signed)
  Subjective:    Patient ID: William [name], male    DOB: [birth year], 50 y.o.   MRN: [id number]  HPI Pt presents to clinic for followup of multiple medical problems.  Tolerating statin tx without myalgias or abn lft. TG remain elevated at 571. trilipex not filled due to cost. Increased xanax to 2mg  qhs due to insomnia and anxiety. fsbs 140's to 170 without hypoglyemia. Is eating middle of the night snacks. No other complaints.  Past Medical History  Diagnosis Date  . Asthma     history  . Arthritis   . History of chicken pox   . Adjustment disorder with mixed anxiety and depressed mood   . Diabetes mellitus     type 2  . Elevated blood pressure     history of high blood pressure readings  . Hypercholesteremia    Past Surgical History  Procedure Date  . Nasal septum surgery     1992  . Knee surgery     right knee new ACL- 1993,2000  . Knee surgery     left knee 2003  . Partial hip arthroplasty     right hip replacement  . Elbow surgery     right elbow 2010, left elbow 08/06/10 Wendall Mola)    reports that he has never smoked. He has never used smokeless tobacco. His alcohol and drug histories not on file. family history includes Diabetes in his mother; Heart disease in his father; Physical abuse in his daughter; and Thyroid disease in his mother. No Known Allergies   Review of Systems see hpi     Objective:   Physical Exam  Physical Exam  Nursing note and vitals reviewed. Constitutional: Appears well-developed and well-nourished. No distress.  HENT:  Head: Normocephalic and atraumatic.  Right Ear: External ear normal.  Left Ear: External ear normal.  Eyes: Conjunctivae are normal. No scleral icterus.  Neck: Neck supple. Carotid bruit is not present.  Cardiovascular: Normal rate, regular rhythm and normal heart sounds.  Exam reveals no gallop and no friction rub.   No murmur heard. Pulmonary/Chest: Effort normal and breath sounds normal. No respiratory distress. He  has no wheezes. no rales.  Lymphadenopathy:    He has no cervical adenopathy.  Neurological:Alert.  Skin: Skin is warm and dry. Not diaphoretic.  Psychiatric: Has a normal mood and affect.        Assessment & Plan:

## 2010-10-16 NOTE — Telephone Encounter (Signed)
Lab orders entered for [facility] for the first week in December

## 2010-10-17 ENCOUNTER — Ambulatory Visit: Payer: BC Managed Care – PPO

## 2010-10-17 ENCOUNTER — Ambulatory Visit: Payer: BC Managed Care – PPO | Admitting: Internal Medicine

## 2010-10-28 NOTE — Assessment & Plan Note (Signed)
Increase xanax to 2mg  po qhs prn. Discussed potential for tolerance and/or addiction

## 2010-10-28 NOTE — Assessment & Plan Note (Signed)
Appropriate diabetic diet and nighttime snack avoidance. Obtain a1c and chem7.

## 2010-10-28 NOTE — Assessment & Plan Note (Signed)
Add fenofibrate daily. Discussed potential higher incidence of side effects when used with statin tx.

## 2010-10-31 ENCOUNTER — Ambulatory Visit: Payer: BC Managed Care – PPO

## 2010-10-31 DIAGNOSIS — Z0289 Encounter for other administrative examinations: Secondary | ICD-10-CM

## 2010-11-06 ENCOUNTER — Telehealth: Payer: Self-pay | Admitting: Internal Medicine

## 2010-11-06 DIAGNOSIS — E119 Type 2 diabetes mellitus without complications: Secondary | ICD-10-CM

## 2010-11-06 MED ORDER — METFORMIN HCL ER 500 MG PO TB24: ORAL_TABLET | ORAL | Status: DC

## 2010-11-06 NOTE — Telephone Encounter (Signed)
Rx refill sent to pharmacy. 

## 2010-11-06 NOTE — Telephone Encounter (Signed)
Refill- metformin 500mg  er tab sunp. Take two tablets by mouth in the morning, then 1 tab in the pm. Qty 90. Last fill 8.16.12

## 2010-11-07 ENCOUNTER — Ambulatory Visit (INDEPENDENT_AMBULATORY_CARE_PROVIDER_SITE_OTHER): Payer: BC Managed Care – PPO

## 2010-11-07 DIAGNOSIS — Z23 Encounter for immunization: Secondary | ICD-10-CM

## 2010-11-08 ENCOUNTER — Ambulatory Visit: Payer: BC Managed Care – PPO

## 2010-11-09 ENCOUNTER — Telehealth: Payer: Self-pay | Admitting: Internal Medicine

## 2010-11-09 MED ORDER — FENOFIBRATE 160 MG PO TABS: 160.0000 mg | ORAL_TABLET | Freq: Every day | ORAL | Status: DC

## 2010-11-09 NOTE — Telephone Encounter (Signed)
Substitute 160mg 

## 2010-11-09 NOTE — Telephone Encounter (Signed)
Rx change sent to pharmacy 

## 2010-11-09 NOTE — Telephone Encounter (Signed)
Fenofibrate(tricor) 145mg  tablet. Take one tablet (145mg  total) by mouth daily. Qty 30  Pharmacy comments: patient would prefer a generic. Fenofibrate generic available as 54mg  and 160mg  tabs, would one of those be acceptable?

## 2010-11-12 ENCOUNTER — Ambulatory Visit (INDEPENDENT_AMBULATORY_CARE_PROVIDER_SITE_OTHER): Payer: BC Managed Care – PPO | Admitting: Internal Medicine

## 2010-11-12 ENCOUNTER — Encounter: Payer: Self-pay | Admitting: Internal Medicine

## 2010-11-12 ENCOUNTER — Telehealth: Payer: Self-pay | Admitting: Internal Medicine

## 2010-11-12 DIAGNOSIS — J069 Acute upper respiratory infection, unspecified: Secondary | ICD-10-CM

## 2010-11-12 DIAGNOSIS — E119 Type 2 diabetes mellitus without complications: Secondary | ICD-10-CM

## 2010-11-12 DIAGNOSIS — Z789 Other specified health status: Secondary | ICD-10-CM | POA: Insufficient documentation

## 2010-11-12 MED ORDER — INSULIN GLARGINE 100 UNIT/ML ~~LOC~~ SOLN: 75.0000 [IU] | Freq: Every day | SUBCUTANEOUS | Status: DC

## 2010-11-12 MED ORDER — AZITHROMYCIN 250 MG PO TABS: ORAL_TABLET | ORAL | Status: AC

## 2010-11-12 MED ORDER — CIPROFLOXACIN HCL 500 MG PO TABS: 500.0000 mg | ORAL_TABLET | Freq: Two times a day (BID) | ORAL | Status: AC

## 2010-11-12 MED ORDER — ATOVAQUONE-PROGUANIL HCL 250-100 MG PO TABS: ORAL_TABLET | ORAL | Status: DC

## 2010-11-12 MED ORDER — DIPHENOXYLATE-ATROPINE 2.5-0.025 MG PO TABS: 1.0000 | ORAL_TABLET | Freq: Four times a day (QID) | ORAL | Status: DC | PRN

## 2010-11-12 NOTE — Telephone Encounter (Signed)
Patient  Is at the pharmacy and needs to pick up his lantus

## 2010-11-12 NOTE — Assessment & Plan Note (Signed)
Suspect current possible viral etiology. Continue otc sx relief prn. Provided with zpak prescription to hold. Begin abx if no improvement of sx's after total duration of 7-10 days. Followup if no improvement or worsening.

## 2010-11-12 NOTE — Telephone Encounter (Signed)
Rx for Lantus sent to pharmacy.

## 2010-11-12 NOTE — Progress Notes (Signed)
  Subjective:    Patient ID: William [name], male    DOB: 1960/04/19, 50 y.o.   MRN: [id number]  HPI Pt presents to clinic for evaluation of cough. Notes three day h/o cough productive for green sputum with chest congestion. Taking otc cough syrup with some improvement . No other alleviating or exacerbating factors. Notes fsbs 160-190 but eats nighttime snacks because lantus makes him hungry. Lantus dose up to 75units. Has upcoming week long trip to [place] and has questions about malaria prophylaxis. Also requests lomotil prn for trip and prescription for travelers diarrhea. No other complaints.  Past Medical History  Diagnosis Date  . Asthma     history  . Arthritis   . History of chicken pox   . Adjustment disorder with mixed anxiety and depressed mood   . Diabetes mellitus     type 2  . Elevated blood pressure     history of high blood pressure readings  . Hypercholesteremia    Past Surgical History  Procedure Date  . Nasal septum surgery     1992  . Knee surgery     right knee new ACL- 1993,2000  . Knee surgery     left knee 2003  . Partial hip arthroplasty     right hip replacement  . Elbow surgery     right elbow 2010, left elbow 08/06/10 Wendall Mola)    reports that he has never smoked. He has never used smokeless tobacco. His alcohol and drug histories not on file. family history includes Diabetes in his mother; Heart disease in his father; Physical abuse in his daughter; and Thyroid disease in his mother. No Known Allergies     Review of Systems  Constitutional: Negative for fever and chills.  HENT: Positive for congestion and rhinorrhea.   Respiratory: Positive for cough. Negative for shortness of breath and wheezing.        Objective:   Physical Exam  Nursing note and vitals reviewed. Constitutional: He appears well-developed and well-nourished. No distress.  HENT:  Head: Normocephalic and atraumatic.  Right Ear: Tympanic membrane, external ear and ear canal  normal.  Left Ear: Tympanic membrane, external ear and ear canal normal.  Nose: Nose normal.  Mouth/Throat: Oropharynx is clear and moist. No oropharyngeal exudate.  Eyes: Conjunctivae are normal. No scleral icterus.  Neck: Neck supple.  Cardiovascular: Normal rate, regular rhythm and normal heart sounds.  Exam reveals no gallop and no friction rub.   No murmur heard. Pulmonary/Chest: Effort normal and breath sounds normal. No respiratory distress. He has no wheezes. He has no rales.  Neurological: He is alert.  Skin: Skin is warm and dry. He is not diaphoretic.  Psychiatric: He has a normal mood and affect.          Assessment & Plan:

## 2010-11-12 NOTE — Assessment & Plan Note (Signed)
rf lantus at 75 units qhs.

## 2010-11-12 NOTE — Assessment & Plan Note (Signed)
CDC travel website consulted. Provide with prescription for malaria prophylaxis. cipro for travelers diarrhea given as well.

## 2010-11-14 ENCOUNTER — Other Ambulatory Visit: Payer: Self-pay | Admitting: *Deleted

## 2010-11-14 ENCOUNTER — Telehealth: Payer: Self-pay | Admitting: Internal Medicine

## 2010-11-14 ENCOUNTER — Telehealth: Payer: Self-pay | Admitting: *Deleted

## 2010-11-14 DIAGNOSIS — E119 Type 2 diabetes mellitus without complications: Secondary | ICD-10-CM

## 2010-11-14 MED ORDER — INSULIN GLARGINE 100 UNIT/ML ~~LOC~~ SOLN: 75.0000 [IU] | Freq: Every day | SUBCUTANEOUS | Status: DC

## 2010-11-14 NOTE — Telephone Encounter (Signed)
ok 

## 2010-11-14 NOTE — Telephone Encounter (Signed)
Spoke with pharmacist at [facility]. She asked to disregard this Rx. Patient has concerns with the increase in cost of the Lantus for the requested amount.  Nothing needed at this time.

## 2010-11-14 NOTE — Telephone Encounter (Signed)
lantus 100/ml inj sano. Inject 46 units every evening at bedtime.   Pharmacy comments: patient says dose has been increased. Please send new rx and updated directions.

## 2010-11-14 NOTE — Telephone Encounter (Signed)
Patient called and left voice message and sent a my chart message stating the pharmacy does not have the Rx refill for Lantus.   Call was placed to [facility] at [facility] [phone]. I spoke with Cala Bradford she has verified the Rx was not received.   Rx resent to pharmacy.

## 2010-11-14 NOTE — Telephone Encounter (Signed)
Leann-Pharmacist with [facility] called and left voice message stating patient is requesting to switch from Lantus Vial to Lantus Solostar Pen. Her message stated that patient has reached his maximum coverage for his Rx benefits, and will have to pay $200 out of pocket for the medication. He was able to find a discount coupon online that will allow him to get the pen for $25 per month. Please advise.

## 2010-11-14 NOTE — Telephone Encounter (Signed)
Rx change to Lantus Solostar Pen sent to pharmacy per Dr Rodena Medin okay per patient request.

## 2010-11-27 ENCOUNTER — Ambulatory Visit (HOSPITAL_BASED_OUTPATIENT_CLINIC_OR_DEPARTMENT_OTHER)
Admission: RE | Admit: 2010-11-27 | Discharge: 2010-11-27 | Disposition: A | Discharge status: Home / Self Care | Payer: BC Managed Care – PPO | Source: Ambulatory Visit | Attending: Internal Medicine | Admitting: Internal Medicine

## 2010-11-27 ENCOUNTER — Ambulatory Visit (INDEPENDENT_AMBULATORY_CARE_PROVIDER_SITE_OTHER): Payer: BC Managed Care – PPO | Admitting: Internal Medicine

## 2010-11-27 ENCOUNTER — Encounter: Payer: Self-pay | Admitting: Internal Medicine

## 2010-11-27 VITALS — BP 100/80 | HR 85 | Temp 97.7°F | Resp 20

## 2010-11-27 DIAGNOSIS — R911 Solitary pulmonary nodule: Secondary | ICD-10-CM

## 2010-11-27 DIAGNOSIS — J069 Acute upper respiratory infection, unspecified: Secondary | ICD-10-CM

## 2010-11-27 DIAGNOSIS — R05 Cough: Secondary | ICD-10-CM | POA: Insufficient documentation

## 2010-11-27 DIAGNOSIS — R059 Cough, unspecified: Secondary | ICD-10-CM | POA: Insufficient documentation

## 2010-11-27 MED ORDER — LEVOFLOXACIN 500 MG PO TABS: 500.0000 mg | ORAL_TABLET | Freq: Every day | ORAL | Status: DC

## 2010-11-28 ENCOUNTER — Other Ambulatory Visit: Payer: Self-pay | Admitting: Internal Medicine

## 2010-11-28 DIAGNOSIS — Z125 Encounter for screening for malignant neoplasm of prostate: Secondary | ICD-10-CM

## 2010-11-30 ENCOUNTER — Encounter: Payer: Self-pay | Admitting: Internal Medicine

## 2010-11-30 ENCOUNTER — Telehealth: Payer: Self-pay | Admitting: *Deleted

## 2010-11-30 MED ORDER — ATOVAQUONE-PROGUANIL HCL 250-100 MG PO TABS: ORAL_TABLET | ORAL | Status: DC

## 2010-11-30 NOTE — Telephone Encounter (Signed)
ok 

## 2010-11-30 NOTE — Telephone Encounter (Signed)
Patient called stating he has filled his rx for the Malaria pills last week, and he has misplaced the box. He stated the he has looked "high and low " for the medication and can not find it. He stated that he will be leaving next week for [place] and would like to know if Dr Rodena Medin would provide another Rx for him.  Call placed to [facility] at [phone] spoke with [facility]. She stated the Rx was filled for patient on 11/12/2010, but she was unable to verify if Rx had been picked up by patient.

## 2010-11-30 NOTE — Telephone Encounter (Signed)
Call placed to patient at [phone], he was informed of Rx sent to [facility] per Dr Rodena Medin ok.

## 2010-12-02 NOTE — Progress Notes (Signed)
  Subjective:    Patient ID: William [name], male    DOB: 11-Jul-1960, 50 y.o.   MRN: [id number]  HPI Pt presents to clinic for evaluation of cough. S/p zpak 10/8 for cough and URI sx's with improvement but no resolution. Has cough productive for yellow sputum and associated fatigue. No f/c. No alleviating or exacerbating factors. No other new complaints.  Past Medical History  Diagnosis Date  . Asthma     history  . Arthritis   . History of chicken pox   . Adjustment disorder with mixed anxiety and depressed mood   . Diabetes mellitus     type 2  . Elevated blood pressure     history of high blood pressure readings  . Hypercholesteremia    Past Surgical History  Procedure Date  . Nasal septum surgery     1992  . Knee surgery     right knee new ACL- 1993,2000  . Knee surgery     left knee 2003  . Partial hip arthroplasty     right hip replacement  . Elbow surgery     right elbow 2010, left elbow 08/06/10 Wendall Mola)    reports that he has never smoked. He has never used smokeless tobacco. His alcohol and drug histories not on file. family history includes Diabetes in his mother; Heart disease in his father; Physical abuse in his daughter; and Thyroid disease in his mother. No Known Allergies     Review of Systems see hpi     Objective:   Physical Exam  Nursing note and vitals reviewed. Constitutional: He appears well-developed and well-nourished. No distress.  HENT:  Head: Normocephalic and atraumatic.  Right Ear: Tympanic membrane, external ear and ear canal normal.  Left Ear: Tympanic membrane, external ear and ear canal normal.  Nose: Nose normal.  Mouth/Throat: Oropharynx is clear and moist. No oropharyngeal exudate.  Eyes: Conjunctivae are normal. No scleral icterus.  Neck: Neck supple.  Cardiovascular: Normal rate, regular rhythm and normal heart sounds.   Pulmonary/Chest: Effort normal and breath sounds normal. No respiratory distress. He has no wheezes. He  has no rales.  Lymphadenopathy:    He has no cervical adenopathy.  Neurological: He is alert.  Skin: Skin is warm and dry. He is not diaphoretic.  Psychiatric: He has a normal mood and affect.          Assessment & Plan:

## 2010-12-02 NOTE — Assessment & Plan Note (Signed)
Obtain cxr pa/lat to evaluate for infiltrate given duration of sx's. Begin levaquin 500mg  po qd. Followup if no improvement or worsening.

## 2010-12-04 ENCOUNTER — Telehealth: Payer: Self-pay | Admitting: Internal Medicine

## 2010-12-04 ENCOUNTER — Encounter: Payer: Self-pay | Admitting: Internal Medicine

## 2010-12-04 DIAGNOSIS — E119 Type 2 diabetes mellitus without complications: Secondary | ICD-10-CM

## 2010-12-04 MED ORDER — INSULIN GLULISINE 100 UNIT/ML ~~LOC~~ SOLN: 15.0000 [IU] | Freq: Three times a day (TID) | SUBCUTANEOUS | Status: DC

## 2010-12-04 MED ORDER — LEVOFLOXACIN 500 MG PO TABS: 500.0000 mg | ORAL_TABLET | Freq: Every day | ORAL | Status: AC

## 2010-12-04 NOTE — Telephone Encounter (Signed)
REFILL APIDRA U 100 INJ SANO  QTY 30 INJECT 15 UNITS SUBCUTANEOUSLY THREE TIMES DAILY BEFORE MEALS LAST FILL 10-10-2010

## 2010-12-04 NOTE — Telephone Encounter (Signed)
Rx refill sent to pharmacy. 

## 2010-12-18 ENCOUNTER — Ambulatory Visit: Payer: BC Managed Care – PPO | Admitting: Internal Medicine

## 2010-12-22 ENCOUNTER — Encounter: Payer: Self-pay | Admitting: Internal Medicine

## 2011-01-10 ENCOUNTER — Other Ambulatory Visit: Payer: Self-pay | Admitting: *Deleted

## 2011-01-10 NOTE — Telephone Encounter (Signed)
Received fax refill request from [facility] for Alprazolam 1mg   Take 2 tablets by mouth at bedtime as needed. #60. Please advise re: # of refills.

## 2011-01-10 NOTE — Telephone Encounter (Signed)
2 rf 

## 2011-01-11 MED ORDER — ALPRAZOLAM 1 MG PO TABS: 2.0000 mg | ORAL_TABLET | Freq: Every evening | ORAL | Status: DC | PRN

## 2011-01-11 NOTE — Telephone Encounter (Signed)
#  60 x 1 refill left on pharmacy voicemail.

## 2011-01-14 ENCOUNTER — Ambulatory Visit: Payer: BC Managed Care – PPO | Admitting: Internal Medicine

## 2011-01-21 ENCOUNTER — Telehealth: Payer: Self-pay | Admitting: Internal Medicine

## 2011-01-21 NOTE — Telephone Encounter (Signed)
Patient states that he will come in on 02/08/11 for his labs. [place] [facility]

## 2011-01-21 NOTE — Telephone Encounter (Signed)
Noted  

## 2011-02-18 ENCOUNTER — Encounter: Payer: Self-pay | Admitting: Internal Medicine

## 2011-02-19 ENCOUNTER — Ambulatory Visit: Payer: Self-pay | Admitting: Internal Medicine

## 2011-02-21 ENCOUNTER — Other Ambulatory Visit: Payer: Self-pay | Admitting: *Deleted

## 2011-02-21 ENCOUNTER — Other Ambulatory Visit: Payer: Self-pay | Admitting: Internal Medicine

## 2011-02-21 DIAGNOSIS — E119 Type 2 diabetes mellitus without complications: Secondary | ICD-10-CM

## 2011-02-21 DIAGNOSIS — Z125 Encounter for screening for malignant neoplasm of prostate: Secondary | ICD-10-CM

## 2011-02-21 DIAGNOSIS — E785 Hyperlipidemia, unspecified: Secondary | ICD-10-CM

## 2011-02-21 LAB — LIPID PANEL
LDL Cholesterol: 75 mg/dL (ref 0–99)
Triglycerides: 219 mg/dL — ABNORMAL HIGH (ref <150)

## 2011-02-21 LAB — HEPATIC FUNCTION PANEL
Alkaline Phosphatase: 60 U/L (ref 39–117)
Total Protein: 6.9 g/dL (ref 6.0–8.3)

## 2011-02-22 LAB — HEMOGLOBIN A1C: Mean Plasma Glucose: 169 mg/dL — ABNORMAL HIGH (ref <117)

## 2011-02-25 ENCOUNTER — Encounter: Payer: Self-pay | Admitting: Internal Medicine

## 2011-02-25 ENCOUNTER — Ambulatory Visit (INDEPENDENT_AMBULATORY_CARE_PROVIDER_SITE_OTHER): Payer: BC Managed Care – PPO | Admitting: Internal Medicine

## 2011-02-25 VITALS — BP 112/74 | HR 88 | Temp 97.8°F | Resp 18 | Ht 72.0 in | Wt 224.0 lb

## 2011-02-25 DIAGNOSIS — R6882 Decreased libido: Secondary | ICD-10-CM

## 2011-02-25 DIAGNOSIS — E119 Type 2 diabetes mellitus without complications: Secondary | ICD-10-CM

## 2011-02-25 DIAGNOSIS — E785 Hyperlipidemia, unspecified: Secondary | ICD-10-CM

## 2011-02-25 MED ORDER — METFORMIN HCL ER (OSM) 1000 MG PO TB24: ORAL_TABLET | ORAL | Status: DC

## 2011-02-25 NOTE — Patient Instructions (Signed)
Please schedule testosterone level for early morning (799.81) Also please schedule fasting labs prior to next appointment (chem7, a1c, urine microalbumin-250.0 and lipid 272.4)

## 2011-02-25 NOTE — Progress Notes (Signed)
  Subjective:    Patient ID: William [name], male    DOB: Dec 31, 1960, 51 y.o.   MRN: [id number]  HPI Pt presents to clinic for followup of multiple medical problems. Reviewed improved TG improved with fenofibrate. ldl at goal. fsbs fasting in am 110's-150's without hypoglycemia. Notes decreased libido. bp reviewed as normotensive. Resuming exericse program. No other complaints.  Past Medical History  Diagnosis Date  . Asthma     history  . Arthritis   . History of chicken pox   . Adjustment disorder with mixed anxiety and depressed mood   . Diabetes mellitus     type 2  . Elevated blood pressure     history of high blood pressure readings  . Hypercholesteremia    Past Surgical History  Procedure Date  . Nasal septum surgery     1992  . Knee surgery     right knee new ACL- 1993,2000  . Knee surgery     left knee 2003  . Partial hip arthroplasty     right hip replacement  . Elbow surgery     right elbow 2010, left elbow 08/06/10 Wendall Mola)    reports that he has never smoked. He has never used smokeless tobacco. His alcohol and drug histories not on file. family history includes Diabetes in his mother; Heart disease in his father; Physical abuse in his daughter; and Thyroid disease in his mother. No Known Allergies    Review of Systems see hpi     Objective:   Physical Exam  Nursing note and vitals reviewed. Constitutional: He appears well-developed and well-nourished. No distress.  HENT:  Head: Normocephalic and atraumatic.  Neurological: He is alert.  Skin: He is not diaphoretic.  Psychiatric: He has a normal mood and affect.          Assessment & Plan:

## 2011-02-25 NOTE — Assessment & Plan Note (Signed)
Mildly suboptimal. Increase metformin 1000mg  bid. Increase lantus to 74 units. Begin regular aerobic exercise program.

## 2011-02-25 NOTE — Assessment & Plan Note (Signed)
Improving control. Continue statin and fenofibrate combination. Resume exercise

## 2011-02-25 NOTE — Assessment & Plan Note (Signed)
Obtain early am testosterone. 

## 2011-02-26 ENCOUNTER — Other Ambulatory Visit: Payer: Self-pay | Admitting: Internal Medicine

## 2011-02-26 DIAGNOSIS — E785 Hyperlipidemia, unspecified: Secondary | ICD-10-CM

## 2011-02-26 DIAGNOSIS — R6882 Decreased libido: Secondary | ICD-10-CM

## 2011-02-26 DIAGNOSIS — E119 Type 2 diabetes mellitus without complications: Secondary | ICD-10-CM

## 2011-02-26 LAB — TESTOSTERONE: Testosterone: 226.43 ng/dL — ABNORMAL LOW (ref 250–890)

## 2011-03-05 ENCOUNTER — Encounter: Payer: Self-pay | Admitting: Internal Medicine

## 2011-03-06 ENCOUNTER — Encounter: Payer: Self-pay | Admitting: Internal Medicine

## 2011-03-06 ENCOUNTER — Telehealth: Payer: Self-pay | Admitting: Internal Medicine

## 2011-03-06 DIAGNOSIS — R6882 Decreased libido: Secondary | ICD-10-CM

## 2011-03-06 MED ORDER — TESTOSTERONE 50 MG/5GM (1%) TD GEL: 5.0000 g | Freq: Every day | TRANSDERMAL | Status: DC

## 2011-03-06 MED ORDER — ALPRAZOLAM 1 MG PO TABS: 2.0000 mg | ORAL_TABLET | Freq: Every evening | ORAL | Status: DC | PRN

## 2011-03-06 NOTE — Telephone Encounter (Signed)
Verbal refill provided to pharmacist Cala Bradford for Xanax, and New Rx for Androgel.  Future lab orders entered for March 2013 for psa & testosterone.

## 2011-03-06 NOTE — Telephone Encounter (Signed)
Refill-alprazolam 1 mg tab dava.

## 2011-03-13 ENCOUNTER — Encounter: Payer: Self-pay | Admitting: Internal Medicine

## 2011-03-13 NOTE — Telephone Encounter (Signed)
Received approval from [facility] for AndroDerm, Androgel from 03/08/11 until 12/01/13. Notified pharmacy.

## 2011-03-14 ENCOUNTER — Other Ambulatory Visit: Payer: Self-pay | Admitting: *Deleted

## 2011-03-14 NOTE — Telephone Encounter (Signed)
Open in error

## 2011-03-18 ENCOUNTER — Encounter: Payer: Self-pay | Admitting: Internal Medicine

## 2011-03-18 ENCOUNTER — Telehealth: Payer: Self-pay | Admitting: Internal Medicine

## 2011-03-18 NOTE — Telephone Encounter (Signed)
Verbal order provided to pharmacist.

## 2011-03-19 MED ORDER — TESTOSTERONE 12.5 MG/ACT (1%) TD GEL: 1.2500 | Freq: Every day | TRANSDERMAL | Status: DC

## 2011-03-19 NOTE — Telephone Encounter (Signed)
Per patient request to change; Dr Rodena Medin advised 1.62 %  2 pumps to upper arm daily.

## 2011-04-03 ENCOUNTER — Telehealth: Payer: Self-pay | Admitting: *Deleted

## 2011-04-03 ENCOUNTER — Ambulatory Visit (INDEPENDENT_AMBULATORY_CARE_PROVIDER_SITE_OTHER): Payer: BC Managed Care – PPO | Admitting: Internal Medicine

## 2011-04-03 ENCOUNTER — Other Ambulatory Visit: Payer: Self-pay | Admitting: Internal Medicine

## 2011-04-03 ENCOUNTER — Encounter: Payer: Self-pay | Admitting: Internal Medicine

## 2011-04-03 VITALS — BP 122/72 | HR 99 | Temp 97.5°F | Resp 20

## 2011-04-03 DIAGNOSIS — T148XXA Other injury of unspecified body region, initial encounter: Secondary | ICD-10-CM

## 2011-04-03 DIAGNOSIS — W460XXA Contact with hypodermic needle, initial encounter: Secondary | ICD-10-CM

## 2011-04-03 DIAGNOSIS — IMO0002 Reserved for concepts with insufficient information to code with codable children: Secondary | ICD-10-CM

## 2011-04-03 NOTE — Progress Notes (Signed)
  Subjective:    Patient ID: William [name], male    DOB: 01-17-1961, 51 y.o.   MRN: [id number]  HPI Pt presents to clinic for evaluation of accidental needle stick. States after assisting a hospice pt who is a Network engineer a Therapist, music dropped off insulin pens to his house. He inadvertently used one with a previously used needle. It was used sq and used once before he realized. The hospice pt is not known to have any infectious disease nor is thought to be high risk. Total time of visit ~28 minutes of which >50% spent in counseling.   Past Medical History  Diagnosis Date  . Asthma     history  . Arthritis   . History of chicken pox   . Adjustment disorder with mixed anxiety and depressed mood   . Diabetes mellitus     type 2  . Elevated blood pressure     history of high blood pressure readings  . Hypercholesteremia    Past Surgical History  Procedure Date  . Nasal septum surgery     1992  . Knee surgery     right knee new ACL- 1993,2000  . Knee surgery     left knee 2003  . Partial hip arthroplasty     right hip replacement  . Elbow surgery     right elbow 2010, left elbow 08/06/10 Wendall Mola)    reports that he has never smoked. He has never used smokeless tobacco. His alcohol and drug histories not on file. No Known Allergies   Review of Systems see hpi     Objective:   Physical Exam  Constitutional: He appears well-developed and well-nourished.  HENT:  Head: Normocephalic and atraumatic.  Neurological: He is alert.  Psychiatric: He has a normal mood and affect.          Assessment & Plan:

## 2011-04-03 NOTE — Telephone Encounter (Signed)
Patient Name: William [name] Call Date & Time: 04/03/2011 1:01:16AM Patient Phone: [phone] PCP: Patient Gender: Male PCP Fax : Patient DOB: [birth year] Practice Name: [facility] [place] Reason for Call: Caller: Jamal/Patient; PCP: Marguarite Arbour; CB#: [phone]; Call regarding Insulin issues; Patient has injected himself with someone elses insulin. Patient has injected himself with another patients pen with a dirty needle. Patient very angry and reluctant to answer any RN questions. Refused many questions. Ultimately insisted that MD oncall be phoned. Call to oncall Dr. Everardo All who stated he cannot help pt overnight. Advised to call office and f/u in AM. Site did not bleed per patient. Urgent s/sx of PUncture Wounds, Accidental needle stick (not known to be unused) identified. Advised ER for evaluation. Pt stated "that will not make a difference." Pt will wait til AM and f/u with MD. Sherron Monday with this pt at length to calm his fears of needle stick. Protocol(s) Used: Abrasions, Lacerations, Puncture Wounds Recommended Outcome per Protocol: See Provider within 4 hours Reason for Outcome: Accidental needle stick (not known to be unused) Care Advice: Specific testing requirements may vary by state. Consult your employee health service or your state's health department. ~ ~ Call provider if symptoms develop. ~ HEALTH PROMOTION / MAINTENANCE ~ IMMEDIATE ACTION

## 2011-04-07 DIAGNOSIS — IMO0002 Reserved for concepts with insufficient information to code with codable children: Secondary | ICD-10-CM | POA: Insufficient documentation

## 2011-04-07 NOTE — Assessment & Plan Note (Signed)
Discussed very low potential for infection given low risk pt as well as sq administration. Will obtain baseline hiv then serial hiv, hep b and c. Do not recommend PEP.

## 2011-04-10 LAB — HIV 1/2 CONFIRMATION
HIV-1 antibody: NEGATIVE
HIV-2 Ab: NEGATIVE

## 2011-05-13 ENCOUNTER — Other Ambulatory Visit: Payer: Self-pay | Admitting: Internal Medicine

## 2011-05-13 NOTE — Telephone Encounter (Signed)
Rx refill sent to pharmacy. 

## 2011-05-17 ENCOUNTER — Other Ambulatory Visit: Payer: Self-pay | Admitting: *Deleted

## 2011-05-17 ENCOUNTER — Other Ambulatory Visit: Payer: Self-pay | Admitting: Internal Medicine

## 2011-05-17 ENCOUNTER — Telehealth: Payer: Self-pay | Admitting: *Deleted

## 2011-05-17 DIAGNOSIS — IMO0002 Reserved for concepts with insufficient information to code with codable children: Secondary | ICD-10-CM

## 2011-05-17 DIAGNOSIS — R6882 Decreased libido: Secondary | ICD-10-CM

## 2011-05-17 DIAGNOSIS — E785 Hyperlipidemia, unspecified: Secondary | ICD-10-CM

## 2011-05-17 DIAGNOSIS — Z20828 Contact with and (suspected) exposure to other viral communicable diseases: Secondary | ICD-10-CM

## 2011-05-17 DIAGNOSIS — E119 Type 2 diabetes mellitus without complications: Secondary | ICD-10-CM

## 2011-05-17 NOTE — Telephone Encounter (Signed)
Patient was in for blood work today. He was informed some of his blood work was fasting. Patient stated that he is not fasting and will return Monday 05/20/2011 for fasting blood work,and other labs.  He would like Dr Rodena Medin to know that the current testosterone is too expensive and he would like to know if he could switch to injections.

## 2011-05-21 LAB — TESTOSTERONE, FREE, TOTAL, SHBG
Testosterone, Free: 73 pg/mL (ref 47.0–244.0)
Testosterone-% Free: 3.4 % — ABNORMAL HIGH (ref 1.6–2.9)

## 2011-05-21 LAB — LIPID PANEL
HDL: 26 mg/dL — ABNORMAL LOW (ref >39)
LDL Cholesterol: 86 mg/dL (ref 0–99)
Total CHOL/HDL Ratio: 6.6 Ratio

## 2011-05-21 LAB — HEMOGLOBIN A1C: Mean Plasma Glucose: 169 mg/dL — ABNORMAL HIGH (ref <117)

## 2011-05-21 LAB — BASIC METABOLIC PANEL
BUN: 16 mg/dL (ref 6–23)
Calcium: 9 mg/dL (ref 8.4–10.5)
Glucose, Bld: 96 mg/dL (ref 70–99)

## 2011-05-21 LAB — HEPATITIS B CORE ANTIBODY, TOTAL: Hep B Core Total Ab: NEGATIVE

## 2011-05-21 LAB — MICROALBUMIN / CREATININE URINE RATIO: Microalb, Ur: 0.99 mg/dL (ref 0.00–1.89)

## 2011-05-21 LAB — HEPATITIS B SURFACE ANTIBODY,QUALITATIVE: Hep B S Ab: POSITIVE — AB

## 2011-05-24 LAB — HIV 1/2 CONFIRMATION: HIV-2 Ab: NEGATIVE

## 2011-05-27 ENCOUNTER — Encounter: Payer: Self-pay | Admitting: Internal Medicine

## 2011-05-27 ENCOUNTER — Ambulatory Visit (INDEPENDENT_AMBULATORY_CARE_PROVIDER_SITE_OTHER): Payer: BC Managed Care – PPO | Admitting: Internal Medicine

## 2011-05-27 VITALS — BP 110/74 | HR 102 | Temp 97.8°F | Ht 72.0 in | Wt 221.0 lb

## 2011-05-27 DIAGNOSIS — E785 Hyperlipidemia, unspecified: Secondary | ICD-10-CM

## 2011-05-27 DIAGNOSIS — I10 Essential (primary) hypertension: Secondary | ICD-10-CM

## 2011-05-27 DIAGNOSIS — E291 Testicular hypofunction: Secondary | ICD-10-CM

## 2011-05-27 DIAGNOSIS — E349 Endocrine disorder, unspecified: Secondary | ICD-10-CM | POA: Insufficient documentation

## 2011-05-27 DIAGNOSIS — E119 Type 2 diabetes mellitus without complications: Secondary | ICD-10-CM

## 2011-05-27 NOTE — Assessment & Plan Note (Signed)
Low total test with nl free test suggestive of decreased protein binding. Pt interested in resumption of low dose testosterone due to sx's. Avoid if free test truly nl. Repeat testosterone panel in 2wk early am lab draw.

## 2011-05-27 NOTE — Assessment & Plan Note (Signed)
Normotensive and stable. Continue current regimen. Monitor bp as outpt and followup in clinic as scheduled.  

## 2011-05-27 NOTE — Progress Notes (Signed)
  Subjective:    Patient ID: William [name], male    DOB: [birth year], 51 y.o.   MRN: [id number]  HPI Pt presents to clinic for followup of multiple medical problems. Off testosterone for one month due to cost concerns. Reviewed total testosterone low but free test nl. Has fatigue that improved with previous testosterone tx. a1c remains elevated. Am fsbs's 140's. States may miss up to 15% of insulin doses. Not currently exercising regularly. Reviewed neg hiv/hep c/hep b sag after insulin needlestic.   Past Medical History  Diagnosis Date  . Asthma     history  . Arthritis   . History of chicken pox   . Adjustment disorder with mixed anxiety and depressed mood   . Diabetes mellitus     type 2  . Elevated blood pressure     history of high blood pressure readings  . Hypercholesteremia    Past Surgical History  Procedure Date  . Nasal septum surgery     1992  . Knee surgery     right knee new ACL- 1993,2000  . Knee surgery     left knee 2003  . Partial hip arthroplasty     right hip replacement  . Elbow surgery     right elbow 2010, left elbow 08/06/10 Wendall Mola)    reports that he has never smoked. He has never used smokeless tobacco. His alcohol and drug histories not on file. family history includes Diabetes in his mother; Heart disease in his father; Physical abuse in his daughter; and Thyroid disease in his mother. No Known Allergies    Review of Systems see hpi     Objective:   Physical Exam  Nursing note and vitals reviewed. Constitutional: He appears well-developed and well-nourished. No distress.  HENT:  Head: Normocephalic and atraumatic.  Right Ear: External ear normal.  Left Ear: External ear normal.  Neurological: He is alert.  Skin: He is not diaphoretic.  Psychiatric: He has a normal mood and affect.          Assessment & Plan:

## 2011-05-27 NOTE — Assessment & Plan Note (Signed)
ldl at goal. TG elevated. Low fat diet,exercise and wt loss

## 2011-05-27 NOTE — Patient Instructions (Signed)
Please schedule early am testosterone and free testosterone level in ~2 weeks (hypogonadism) Also schedule fasting labs prior to next visit cbc, chem7, a1c 250.0 and lipid 272.4

## 2011-05-27 NOTE — Assessment & Plan Note (Signed)
Mildly suboptimal control. Insulin dose compliance and regular exercise program will help. Can make small adjustments in insulin dosing for now-reviewed with detail.

## 2011-05-30 NOTE — Telephone Encounter (Signed)
Addressed at patients office visit 05/27/2011

## 2011-05-31 ENCOUNTER — Encounter: Payer: Self-pay | Admitting: Internal Medicine

## 2011-06-11 ENCOUNTER — Encounter: Payer: Self-pay | Admitting: Internal Medicine

## 2011-06-11 ENCOUNTER — Other Ambulatory Visit: Payer: Self-pay | Admitting: Internal Medicine

## 2011-06-11 ENCOUNTER — Telehealth: Payer: Self-pay | Admitting: Internal Medicine

## 2011-06-11 MED ORDER — ALPRAZOLAM 1 MG PO TABS: 2.0000 mg | ORAL_TABLET | Freq: Every evening | ORAL | Status: DC | PRN

## 2011-06-11 NOTE — Telephone Encounter (Signed)
Refill- Alprazolam 1mg  tab.

## 2011-06-11 NOTE — Telephone Encounter (Signed)
Please advise; re- refills

## 2011-06-11 NOTE — Telephone Encounter (Signed)
Refill left on pharmacy voicemail. 

## 2011-06-11 NOTE — Telephone Encounter (Signed)
rf 3 

## 2011-06-12 ENCOUNTER — Encounter: Payer: Self-pay | Admitting: Internal Medicine

## 2011-06-24 ENCOUNTER — Other Ambulatory Visit: Payer: Self-pay | Admitting: Internal Medicine

## 2011-06-24 DIAGNOSIS — E291 Testicular hypofunction: Secondary | ICD-10-CM

## 2011-06-24 DIAGNOSIS — E785 Hyperlipidemia, unspecified: Secondary | ICD-10-CM

## 2011-06-24 DIAGNOSIS — E119 Type 2 diabetes mellitus without complications: Secondary | ICD-10-CM

## 2011-06-25 LAB — TESTOSTERONE, FREE, TOTAL, SHBG
Sex Hormone Binding: 7 nmol/L — ABNORMAL LOW (ref 13–71)
Testosterone, Free: 73.8 pg/mL (ref 47.0–244.0)
Testosterone-% Free: 3.4 % — ABNORMAL HIGH (ref 1.6–2.9)
Testosterone: 214.33 ng/dL — ABNORMAL LOW (ref 300–890)

## 2011-07-02 ENCOUNTER — Encounter: Payer: Self-pay | Admitting: Internal Medicine

## 2011-07-09 ENCOUNTER — Other Ambulatory Visit: Payer: Self-pay | Admitting: Internal Medicine

## 2011-07-09 DIAGNOSIS — E669 Obesity, unspecified: Secondary | ICD-10-CM

## 2011-08-20 ENCOUNTER — Encounter: Payer: Self-pay | Admitting: Internal Medicine

## 2011-08-21 ENCOUNTER — Other Ambulatory Visit: Payer: Self-pay | Admitting: Internal Medicine

## 2011-08-22 NOTE — Telephone Encounter (Signed)
Should be a flag/phone note about it. i received my chart communication and approved it but the quantity depends on how long the trip is. See the sig.

## 2011-08-22 NOTE — Telephone Encounter (Signed)
Please advise re: request. 

## 2011-08-23 ENCOUNTER — Encounter: Payer: Self-pay | Admitting: Internal Medicine

## 2011-08-23 NOTE — Telephone Encounter (Signed)
See my chart message

## 2011-08-26 NOTE — Telephone Encounter (Signed)
Rx Done--Pt leaving 08.02.13, returning 08.19.13 [#27x0]/SLS

## 2011-08-27 LAB — CBC
MCH: 29.4 pg (ref 26.0–34.0)
Platelets: 170 10*3/uL (ref 150–400)
RBC: 4.29 MIL/uL (ref 4.22–5.81)
RDW: 14.5 % (ref 11.5–15.5)
WBC: 5.6 10*3/uL (ref 4.0–10.5)

## 2011-08-27 LAB — BASIC METABOLIC PANEL
BUN: 18 mg/dL (ref 6–23)
CO2: 25 mEq/L (ref 19–32)
Chloride: 103 mEq/L (ref 96–112)
Creat: 0.94 mg/dL (ref 0.50–1.35)
Potassium: 4.4 mEq/L (ref 3.5–5.3)

## 2011-08-27 NOTE — Addendum Note (Signed)
Addended by: Mervin Kung A on: 08/27/2011 08:05 AM   Modules accepted: Orders

## 2011-08-27 NOTE — Progress Notes (Signed)
Pt presented to the lab, future orders released. 

## 2011-08-28 LAB — LIPID PANEL
HDL: 26 mg/dL — ABNORMAL LOW (ref >39)
Total CHOL/HDL Ratio: 6 Ratio
VLDL: 47 mg/dL — ABNORMAL HIGH (ref 0–40)

## 2011-08-30 ENCOUNTER — Encounter: Payer: Self-pay | Admitting: Internal Medicine

## 2011-08-30 ENCOUNTER — Ambulatory Visit (INDEPENDENT_AMBULATORY_CARE_PROVIDER_SITE_OTHER): Payer: BC Managed Care – PPO | Admitting: Internal Medicine

## 2011-08-30 VITALS — BP 112/72 | HR 89 | Temp 97.8°F | Resp 16 | Wt 222.5 lb

## 2011-08-30 DIAGNOSIS — E349 Endocrine disorder, unspecified: Secondary | ICD-10-CM

## 2011-08-30 DIAGNOSIS — E291 Testicular hypofunction: Secondary | ICD-10-CM

## 2011-08-30 DIAGNOSIS — E119 Type 2 diabetes mellitus without complications: Secondary | ICD-10-CM

## 2011-08-30 DIAGNOSIS — I10 Essential (primary) hypertension: Secondary | ICD-10-CM

## 2011-08-30 MED ORDER — CIPROFLOXACIN HCL 500 MG PO TABS: 500.0000 mg | ORAL_TABLET | Freq: Two times a day (BID) | ORAL | Status: AC

## 2011-08-30 MED ORDER — SILDENAFIL CITRATE 100 MG PO TABS: 100.0000 mg | ORAL_TABLET | Freq: Every day | ORAL | Status: DC | PRN

## 2011-08-30 NOTE — Assessment & Plan Note (Signed)
Improving control with a1c 7.1. Continue current regimen with emphasis on diet/exercise. Obtain chem7 and a1c prior to next visit.

## 2011-08-30 NOTE — Assessment & Plan Note (Signed)
Normotensive and stable. Continue current regimen. Monitor bp as outpt and followup in clinic as scheduled.  

## 2011-08-30 NOTE — Patient Instructions (Signed)
Please schedule chem7, a1c-250.00 and lipid-272.4 prior to next visit
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
## 2011-09-27 ENCOUNTER — Telehealth: Payer: Self-pay | Admitting: Internal Medicine

## 2011-09-27 NOTE — Telephone Encounter (Signed)
Refill- alprazolam 1mg  tab. Last fill 7.17.13

## 2011-10-08 MED ORDER — ALPRAZOLAM 1 MG PO TABS: 2.0000 mg | ORAL_TABLET | Freq: Every evening | ORAL | Status: DC | PRN

## 2011-10-08 NOTE — Telephone Encounter (Signed)
Patient called about alprazolam refill? He states that [facility] has not heard from [place] regarding refill. Please call patient when refill is done. [phone]

## 2011-10-08 NOTE — Telephone Encounter (Signed)
Rx done/SLS 

## 2011-10-08 NOTE — Telephone Encounter (Signed)
OK to give 30 day supply with zero refills.

## 2011-10-08 NOTE — Telephone Encounter (Signed)
Could you please take a look at this request; forwarded to [facility] on 08.23.13 w/o response, do not perceive any reason to Deny in EMR/SLS Thanks so much.

## 2011-10-09 ENCOUNTER — Telehealth: Payer: Self-pay | Admitting: Internal Medicine

## 2011-10-09 MED ORDER — ALPRAZOLAM 1 MG PO TABS: 2.0000 mg | ORAL_TABLET | Freq: Every evening | ORAL | Status: DC | PRN

## 2011-10-09 MED ORDER — GLUCOSE BLOOD VI STRP: ORAL_STRIP | Status: DC

## 2011-10-09 NOTE — Telephone Encounter (Signed)
Per verbal from front office staff, pt is also requesting rx for accucheck compact test strips. Test strips request verified by pt via pharmacist and rx called to pharmacist at [facility] #204 x 1 refill.

## 2011-10-09 NOTE — Telephone Encounter (Signed)
Patient called back stating that his prescription for xanax was called in wrong. He states that he should have received 60 tablets for a 30 day suppy and he only received 30 tablets for a 30 day supply. He states that he takes alprazolam two tablets at bedtime.

## 2011-10-09 NOTE — Telephone Encounter (Signed)
Received call from [facility] requesting clarification of Alprazolam quantity of #30. Directions are 2 tablets by mouth as needed at bedtime for sleep. Per verbal from [name], NP, ok to give #60 x no refills. Verbal given to pharmacist.

## 2011-10-09 NOTE — Telephone Encounter (Signed)
Patient called in wanting to know if xanax refill had been sent to pharmacy. I informed he that it was. 30 day supply with zero refills. He would like to know why was refill sent with zero refills. He states that he would like at least 5 refills. Patient also states that he would like an excuse as to why it took from 09/27/11 to 10/08/11 to have this refill sent in. He states that this process to refill his medication is too long.

## 2011-10-23 ENCOUNTER — Telehealth: Payer: Self-pay | Admitting: Internal Medicine

## 2011-10-23 MED ORDER — INSULIN PEN NEEDLE 29G X 12MM MISC: Status: DC

## 2011-10-23 NOTE — Telephone Encounter (Signed)
DONE/SLS

## 2011-10-23 NOTE — Telephone Encounter (Signed)
Patient left message on triage voicemail requesting a new prescription for Easy Touch Pen Needles 29G 1/2inch to be sent to [facility] on [address]. He states that he is having problems getting needles from his [employer].

## 2011-10-29 ENCOUNTER — Other Ambulatory Visit: Payer: Self-pay | Admitting: Internal Medicine

## 2011-10-29 NOTE — Telephone Encounter (Signed)
Done/SLS 

## 2011-11-04 ENCOUNTER — Ambulatory Visit (INDEPENDENT_AMBULATORY_CARE_PROVIDER_SITE_OTHER): Payer: BC Managed Care – PPO | Admitting: *Deleted

## 2011-11-04 DIAGNOSIS — Z23 Encounter for immunization: Secondary | ICD-10-CM

## 2011-11-11 ENCOUNTER — Telehealth: Payer: Self-pay | Admitting: Internal Medicine

## 2011-11-11 MED ORDER — ALPRAZOLAM 1 MG PO TABS: 2.0000 mg | ORAL_TABLET | Freq: Every evening | ORAL | Status: DC | PRN

## 2011-11-11 NOTE — Telephone Encounter (Signed)
Ok #60 rf 1 

## 2011-11-11 NOTE — Telephone Encounter (Signed)
Refill- alprazolam 1mg  tab. Last fill 9.4.13

## 2011-12-15 ENCOUNTER — Other Ambulatory Visit: Payer: Self-pay | Admitting: Internal Medicine

## 2011-12-16 NOTE — Telephone Encounter (Signed)
Rx to pharmacy/SLS 

## 2012-01-15 ENCOUNTER — Other Ambulatory Visit: Payer: Self-pay | Admitting: Internal Medicine

## 2012-01-15 ENCOUNTER — Telehealth: Payer: Self-pay | Admitting: Internal Medicine

## 2012-01-15 NOTE — Telephone Encounter (Signed)
Refill- alprazolam 1mg  tab.

## 2012-01-15 NOTE — Telephone Encounter (Signed)
Last OV 08-30-11, last filled 11-11-11 #60 1

## 2012-01-16 MED ORDER — ALPRAZOLAM 1 MG PO TABS: 2.0000 mg | ORAL_TABLET | Freq: Every evening | ORAL | Status: DC | PRN

## 2012-01-16 NOTE — Telephone Encounter (Signed)
Rx called to [facility] voicemail #60 x 1 refill.

## 2012-01-16 NOTE — Telephone Encounter (Signed)
Ok to fill same

## 2012-01-28 ENCOUNTER — Telehealth: Payer: Self-pay | Admitting: Family

## 2012-01-28 NOTE — Telephone Encounter (Signed)
Pt was walk-in today w/multiple pages of paperwork RE: medication and/or Insurance changes as of 01.01.14. Pt requested a consult with myself to discuss changes in medication; explained to pt that this is a request that would be handled by a provider, preferably his PCP [who he was scheduled an appointment with for Monday 12.30.13] when provider is back in office to discuss Medication Management in detail. Pt made request for Lantus sample, explained that we do not have any available at this time, or an equivalent Insulin. Took paperwork & medication list to [name], NP to get advice on best option for patient at this time, as pt stated that a vial of Lantus would cost him excessively d/t being past his limit w/insurance. After reviewing paperwork, pt was advised to have [place] send a prescription in for [1] one vial to pharmacy, pt refused. Pt then went downstairs to [facility] to have this Rx ran w/in system & was again advised that cost would be $45. For [1] vial, $139.38 for [2] vials, $236.93 for [3] vials, pt refused to fill. Pt then came back in to office requesting I call [facility] to verify cost he had been given, this was done & verified. Pt then requested that I call the ED & request a sample of Lantus for him; pt informed that this would not be an option for him. Pt was asked if he had an Endocrinologist he could call & he replied "No". Pt then requested that I call [facility], as he is a former Dr. Artist Pais patient; pt was informed that "this is frowned upon & we do not normally call other offices requesting samples for our patients"; pt was insistent and not wavering in his request. Upon pt's insistence, I made the call to [facility] & made the request and they were kind enough to hold a sample for pt to come & p/u at this time. Pt continued to ask questions about whether they would give him pens and/or vials & would they give him [1] and/or [2] and such things as changing pharmacies &  inquiries about different ones; pt was again told that he needed to drive on over to the [place] office as they were expecting him to be there shortly to p/u sample; pt finally complied. Phoned [facility] office & explained situation to them Kennyth Arnold was in office & informed], spoke w/Cindy, apologized for having to make the request, thanked them for their help, and explained that pt was informed to drive directly to their office & p/u the sample that was offered to him [informed that he may try to question them but not to allow]/SLS

## 2012-02-03 ENCOUNTER — Encounter: Payer: Self-pay | Admitting: Internal Medicine

## 2012-02-03 ENCOUNTER — Ambulatory Visit (INDEPENDENT_AMBULATORY_CARE_PROVIDER_SITE_OTHER): Payer: BC Managed Care – PPO | Admitting: Internal Medicine

## 2012-02-03 ENCOUNTER — Other Ambulatory Visit: Payer: Self-pay | Admitting: *Deleted

## 2012-02-03 VITALS — BP 118/78 | HR 108 | Temp 97.8°F | Resp 16 | Wt 225.0 lb

## 2012-02-03 DIAGNOSIS — E785 Hyperlipidemia, unspecified: Secondary | ICD-10-CM

## 2012-02-03 DIAGNOSIS — E291 Testicular hypofunction: Secondary | ICD-10-CM

## 2012-02-03 DIAGNOSIS — E349 Endocrine disorder, unspecified: Secondary | ICD-10-CM

## 2012-02-03 DIAGNOSIS — Z79899 Other long term (current) drug therapy: Secondary | ICD-10-CM

## 2012-02-03 DIAGNOSIS — I10 Essential (primary) hypertension: Secondary | ICD-10-CM

## 2012-02-03 DIAGNOSIS — E119 Type 2 diabetes mellitus without complications: Secondary | ICD-10-CM

## 2012-02-03 LAB — POCT URINALYSIS DIPSTICK
Leukocytes, UA: NEGATIVE
Nitrite, UA: NEGATIVE
Protein, UA: NEGATIVE
pH, UA: 5

## 2012-02-03 MED ORDER — METFORMIN HCL ER 500 MG PO TB24: 1000.0000 mg | ORAL_TABLET | Freq: Two times a day (BID) | ORAL | Status: DC

## 2012-02-03 MED ORDER — INSULIN ASPART 100 UNIT/ML ~~LOC~~ SOLN: 25.0000 [IU] | Freq: Three times a day (TID) | SUBCUTANEOUS | Status: DC

## 2012-02-03 MED ORDER — INSULIN GLARGINE 100 UNIT/ML ~~LOC~~ SOLN: 85.0000 [IU] | Freq: Every day | SUBCUTANEOUS | Status: DC

## 2012-02-03 MED ORDER — TESTOSTERONE 25 MG/2.5GM (1%) TD GEL: 1.0000 | Freq: Every day | TRANSDERMAL | Status: DC

## 2012-02-03 NOTE — Telephone Encounter (Signed)
Message [staff]    i changed and sent all his meds but testosterone. Can you change and phone in androgel 25mg /2.5gm gel paks. Apply one gel pak to upper arm qd. #30 rf 3    Rx phoned to pharmacy/SLS

## 2012-02-03 NOTE — Assessment & Plan Note (Signed)
Worsening control with less lantus use. Change pens to vial for cost consideration. Return to lab for cbc, chem7 and a1c

## 2012-02-03 NOTE — Assessment & Plan Note (Signed)
Obtain lipid/lft. 
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
## 2012-02-03 NOTE — Assessment & Plan Note (Signed)
Normotensive and stable. Continue current regimen. Monitor bp as outpt and followup in clinic as scheduled.  

## 2012-02-03 NOTE — Assessment & Plan Note (Signed)
Obtain testosterone, lft and psa

## 2012-02-03 NOTE — Patient Instructions (Signed)
Please return tomorrow morning for fasting labs Cbc, chem7, a1c-250.0, lipid/lft-272.4 and testosterone/psa-hypogonadism

## 2012-02-04 ENCOUNTER — Ambulatory Visit (INDEPENDENT_AMBULATORY_CARE_PROVIDER_SITE_OTHER): Payer: BC Managed Care – PPO | Admitting: *Deleted

## 2012-02-04 ENCOUNTER — Encounter: Payer: Self-pay | Admitting: Internal Medicine

## 2012-02-04 DIAGNOSIS — Z111 Encounter for screening for respiratory tuberculosis: Secondary | ICD-10-CM

## 2012-02-04 DIAGNOSIS — Z23 Encounter for immunization: Secondary | ICD-10-CM

## 2012-02-04 LAB — HEPATIC FUNCTION PANEL
AST: 23 U/L (ref 0–37)
Albumin: 4.2 g/dL (ref 3.5–5.2)
Alkaline Phosphatase: 59 U/L (ref 39–117)
Bilirubin, Direct: 0.1 mg/dL (ref 0.0–0.3)
Indirect Bilirubin: 0.3 mg/dL (ref 0.0–0.9)
Total Bilirubin: 0.4 mg/dL (ref 0.3–1.2)

## 2012-02-04 LAB — BASIC METABOLIC PANEL
BUN: 19 mg/dL (ref 6–23)
Calcium: 9.3 mg/dL (ref 8.4–10.5)
Creat: 0.99 mg/dL (ref 0.50–1.35)
Glucose, Bld: 250 mg/dL — ABNORMAL HIGH (ref 70–99)
Potassium: 4.9 mEq/L (ref 3.5–5.3)

## 2012-02-04 LAB — HEMOGLOBIN A1C
Hgb A1c MFr Bld: 7.8 % — ABNORMAL HIGH (ref <5.7)
Mean Plasma Glucose: 177 mg/dL — ABNORMAL HIGH (ref <117)

## 2012-02-04 LAB — LIPID PANEL: LDL Cholesterol: 115 mg/dL — ABNORMAL HIGH (ref 0–99)

## 2012-02-04 NOTE — Addendum Note (Signed)
Addended by: Regis Bill on: 02/04/2012 02:55 PM   Modules accepted: Orders

## 2012-02-05 LAB — CBC WITH DIFFERENTIAL/PLATELET
Basophils Absolute: 0 10*3/uL (ref 0.0–0.1)
Basophils Relative: 1 % (ref 0–1)
Eosinophils Absolute: 0.1 10*3/uL (ref 0.0–0.7)
Eosinophils Relative: 3 % (ref 0–5)
HCT: 38.3 % — ABNORMAL LOW (ref 39.0–52.0)
MCH: 29.4 pg (ref 26.0–34.0)
MCHC: 34.2 g/dL (ref 30.0–36.0)
Monocytes Absolute: 0.5 10*3/uL (ref 0.1–1.0)
Monocytes Relative: 10 % (ref 3–12)
Neutro Abs: 2.4 10*3/uL (ref 1.7–7.7)
RDW: 14.6 % (ref 11.5–15.5)

## 2012-02-06 ENCOUNTER — Encounter: Payer: Self-pay | Admitting: *Deleted

## 2012-02-06 ENCOUNTER — Ambulatory Visit: Payer: BC Managed Care – PPO

## 2012-02-06 ENCOUNTER — Telehealth: Payer: Self-pay | Admitting: Internal Medicine

## 2012-02-06 MED ORDER — INSULIN GLARGINE 100 UNIT/ML ~~LOC~~ SOLN: 85.0000 [IU] | Freq: Every day | SUBCUTANEOUS | Status: DC

## 2012-02-06 NOTE — Telephone Encounter (Signed)
Received call from [facility] pharmacy stating previous refill of Lantus was for 1 vial. Pt currently takes 85 units daily and will need 3 vials to complete a month supply. Verbal given to Teshawn to increase quantity to 3 vials.

## 2012-02-11 ENCOUNTER — Encounter: Payer: Self-pay | Admitting: Internal Medicine

## 2012-02-12 ENCOUNTER — Encounter: Payer: Self-pay | Admitting: Internal Medicine

## 2012-02-13 ENCOUNTER — Encounter: Payer: Self-pay | Admitting: Internal Medicine

## 2012-02-14 NOTE — Telephone Encounter (Signed)
Result Notes  Notes Recorded by Edwyna Perfect, MD on 02/13/2012 at 8:23 PM a1c was higher but we expected this. ldl too high. Take statin daily with low fat diet, exercise. His ua showed glucose going along with his recent worsening control

## 2012-02-16 ENCOUNTER — Encounter: Payer: Self-pay | Admitting: Internal Medicine

## 2012-02-20 ENCOUNTER — Encounter: Payer: Self-pay | Admitting: Internal Medicine

## 2012-02-20 ENCOUNTER — Other Ambulatory Visit: Payer: Self-pay | Admitting: Internal Medicine

## 2012-02-20 ENCOUNTER — Telehealth: Payer: Self-pay | Admitting: *Deleted

## 2012-02-20 ENCOUNTER — Other Ambulatory Visit: Payer: Self-pay | Admitting: *Deleted

## 2012-02-20 MED ORDER — TESTOSTERONE 12.5 MG/ACT (1%) TD GEL: 1.2500 | Freq: Every day | TRANSDERMAL | Status: DC

## 2012-02-20 NOTE — Telephone Encounter (Signed)
Androgel request: Testosterone 1.25 GM/ACT (1%) GEL Place onto the skin. 2 pumps to upper arm once a day

## 2012-02-21 NOTE — Telephone Encounter (Signed)
Please see patient's My Chart inquiry RE: Androgel/SLS Thanks.

## 2012-02-24 ENCOUNTER — Encounter: Payer: Self-pay | Admitting: Internal Medicine

## 2012-02-24 MED ORDER — TESTOSTERONE 12.5 MG/ACT (1%) TD GEL: 50.0000 mg | Freq: Every day | TRANSDERMAL | Status: DC

## 2012-02-24 NOTE — Telephone Encounter (Signed)
Per [facility] [01.17.14], Ok to increase dosage from [2 pumps QD = 25 mg] to 4 pumps QD = 50 mg, pharmacy informed of physician order & will dispense 150g x 2RF d/t increase in dosage instructions per phone conversation w/Monica/SLS

## 2012-02-29 ENCOUNTER — Encounter: Payer: Self-pay | Admitting: Internal Medicine

## 2012-03-02 ENCOUNTER — Encounter: Payer: Self-pay | Admitting: Internal Medicine

## 2012-03-02 ENCOUNTER — Telehealth: Payer: Self-pay | Admitting: Internal Medicine

## 2012-03-02 MED ORDER — GLUCOSE BLOOD VI STRP: ORAL_STRIP | Status: DC

## 2012-03-02 NOTE — Telephone Encounter (Signed)
Refill- accu-chek compact strips. Use to test blood sugar 4 times daily. Last fill 1.7.14  Pharmacy comments: pt wants 4 boxes #204 strips.

## 2012-03-02 NOTE — Telephone Encounter (Signed)
I've got a message in for pt to let me know how much he wants? Pt states he checks BS 8 times a day

## 2012-03-06 ENCOUNTER — Encounter: Payer: Self-pay | Admitting: Internal Medicine

## 2012-03-07 ENCOUNTER — Encounter: Payer: Self-pay | Admitting: Internal Medicine

## 2012-03-09 NOTE — Telephone Encounter (Signed)
Please advise Mychart question?

## 2012-03-10 MED ORDER — TESTOSTERONE 20.25 MG/1.25GM (1.62%) TD GEL: 2.0000 | Freq: Every day | TRANSDERMAL | Status: DC

## 2012-03-12 ENCOUNTER — Telehealth: Payer: Self-pay | Admitting: Internal Medicine

## 2012-03-12 MED ORDER — ALPRAZOLAM 1 MG PO TABS: 2.0000 mg | ORAL_TABLET | Freq: Every evening | ORAL | Status: DC | PRN

## 2012-03-12 NOTE — Telephone Encounter (Signed)
OK to refill with same number with the 1 rf

## 2012-03-12 NOTE — Telephone Encounter (Signed)
Please advise refill? Last RX was wrote on 01-16-12 quantity 60 with 1 refill.  If ok fax to [phone]

## 2012-03-12 NOTE — Telephone Encounter (Signed)
Refill- alprazolam 1mg  tablet. Take two tablets by mouth at bedtime as needed for sleep. Qty 60 last fill 1.8.14

## 2012-03-24 ENCOUNTER — Ambulatory Visit (INDEPENDENT_AMBULATORY_CARE_PROVIDER_SITE_OTHER): Payer: BC Managed Care – PPO | Admitting: Family

## 2012-03-24 ENCOUNTER — Encounter: Payer: Self-pay | Admitting: Family

## 2012-03-24 ENCOUNTER — Telehealth: Payer: Self-pay | Admitting: *Deleted

## 2012-03-24 VITALS — BP 126/80 | HR 89 | Temp 98.1°F | Resp 16 | Ht 72.0 in | Wt 229.1 lb

## 2012-03-24 DIAGNOSIS — R350 Frequency of micturition: Secondary | ICD-10-CM

## 2012-03-24 DIAGNOSIS — S0181XA Laceration without foreign body of other part of head, initial encounter: Secondary | ICD-10-CM

## 2012-03-24 DIAGNOSIS — E119 Type 2 diabetes mellitus without complications: Secondary | ICD-10-CM

## 2012-03-24 DIAGNOSIS — S0180XA Unspecified open wound of other part of head, initial encounter: Secondary | ICD-10-CM

## 2012-03-24 LAB — POCT URINALYSIS DIPSTICK
Bilirubin, UA: NEGATIVE
Blood, UA: NEGATIVE
Ketones, UA: NEGATIVE
Spec Grav, UA: 1.015
pH, UA: 6

## 2012-03-24 MED ORDER — TADALAFIL 5 MG PO TABS: ORAL_TABLET | ORAL | Status: DC

## 2012-03-24 NOTE — Telephone Encounter (Signed)
Spoke with pt in the office re: Cialis rx copay of $50 per month. Pt states cost is too much and requests to get Rx revised to state 2 tablets daily so rx will last him 2 months. Advised pt we would not be able to change rx and he voice understanding. Pt called back and left message from pt wanting to know if his symptoms of BPH and ED would be effectively treated if he only takes Cialis every other day.  Per verbal from Provider, she does not feel every other day will be effective dose. Could offer pt to try flomax once a day and cialis on an as needed basis. Left detailed message on pt's cell# to call and let us know if he want to try this.

## 2012-03-24 NOTE — Patient Instructions (Addendum)
Please call if urinary symptoms worsen or do not continue to improve.  Follow up in 2 months.
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
## 2012-03-25 DIAGNOSIS — N401 Enlarged prostate with lower urinary tract symptoms: Secondary | ICD-10-CM | POA: Insufficient documentation

## 2012-03-25 DIAGNOSIS — R350 Frequency of micturition: Secondary | ICD-10-CM | POA: Insufficient documentation

## 2012-03-25 DIAGNOSIS — S0181XA Laceration without foreign body of other part of head, initial encounter: Secondary | ICD-10-CM | POA: Insufficient documentation

## 2012-03-25 NOTE — Assessment & Plan Note (Signed)
UA unremarkable. Pt had improvement with cialis which  [place].  Also reports + ED symptoms.  Will give trial of cialis once daily for probable BPH and ED.  Of note, pt had normal PSA.

## 2012-03-25 NOTE — Assessment & Plan Note (Signed)
Clinically stable, obtain A1C.  

## 2012-03-25 NOTE — Assessment & Plan Note (Signed)
Pt instructed to keep area clean and dry, call if increased redness or swelling occur.

## 2012-04-06 ENCOUNTER — Other Ambulatory Visit: Payer: Self-pay | Admitting: Internal Medicine

## 2012-04-24 ENCOUNTER — Telehealth: Payer: Self-pay | Admitting: Internal Medicine

## 2012-04-24 DIAGNOSIS — E119 Type 2 diabetes mellitus without complications: Secondary | ICD-10-CM

## 2012-04-24 DIAGNOSIS — E291 Testicular hypofunction: Secondary | ICD-10-CM

## 2012-04-24 DIAGNOSIS — E785 Hyperlipidemia, unspecified: Secondary | ICD-10-CM

## 2012-04-24 NOTE — Telephone Encounter (Signed)
Patient has an appointment on 05/08/12 and will be going to [facility] lab the Monday prior.   He wants all labs ordered including all cholesterol, lipids, A1c, and testosterone. Please order labs

## 2012-04-24 NOTE — Telephone Encounter (Signed)
Orders entered

## 2012-04-24 NOTE — Telephone Encounter (Signed)
Labs will be entered per 01/2012 office visit as below:   Please return tomorrow morning for fasting labs  Cbc, chem7, a1c-250.0, lipid/lft-272.4 and testosterone/psa-hypogonadism

## 2012-04-27 ENCOUNTER — Ambulatory Visit: Payer: BC Managed Care – PPO | Admitting: Family

## 2012-05-08 ENCOUNTER — Ambulatory Visit: Payer: BC Managed Care – PPO | Admitting: Family

## 2012-05-11 ENCOUNTER — Telehealth: Payer: Self-pay | Admitting: Internal Medicine

## 2012-05-11 MED ORDER — ALPRAZOLAM 1 MG PO TABS: 2.0000 mg | ORAL_TABLET | Freq: Every evening | ORAL | Status: DC | PRN

## 2012-05-11 NOTE — Telephone Encounter (Signed)
Refill- alprazolam 1mg  tablet. Take two tablets by mouth at bedtime as needed for sleep. Qty 30 last fill 3.3.14

## 2012-05-11 NOTE — Telephone Encounter (Signed)
Refill called to [place] at [facility], #60 x no refills.

## 2012-06-08 ENCOUNTER — Other Ambulatory Visit: Payer: Self-pay | Admitting: Family

## 2012-06-08 ENCOUNTER — Other Ambulatory Visit: Payer: Self-pay | Admitting: Family Medicine

## 2012-06-08 NOTE — Telephone Encounter (Signed)
Rx called to [facility] as below. 

## 2012-06-08 NOTE — Telephone Encounter (Signed)
Please advise refill? Last RX was wrote on 05-11-12 quantity 60 with 0 refill   If ok fax to [phone]

## 2012-06-08 NOTE — Telephone Encounter (Signed)
Dr Abner Greenspan, can you authorize in Melissa's absence. Pt states he is out now and wants to pick up rx today.

## 2012-06-09 ENCOUNTER — Telehealth: Payer: Self-pay

## 2012-06-09 NOTE — Telephone Encounter (Signed)
So I would change the Metformin 500 mg tabs to 2 tabs po bid and then he should monitor his BS if he starts running low (<80) then drop til 1 tab po bid til seen next month.

## 2012-06-09 NOTE — Telephone Encounter (Signed)
Pharmacy called stating that there is two directions on the metformin. Can you please verify how he is supposed to be taking this? I went back and looked at the history and it looks like quite a few directions are on there

## 2012-06-09 NOTE — Telephone Encounter (Signed)
Pt states he takes 2 in AM and 1 in pm prn.  Pharmacist informed

## 2012-07-10 ENCOUNTER — Other Ambulatory Visit: Payer: Self-pay | Admitting: Internal Medicine

## 2012-07-10 ENCOUNTER — Other Ambulatory Visit: Payer: Self-pay | Admitting: Family

## 2012-07-10 MED ORDER — ALPRAZOLAM 1 MG PO TABS: 2.0000 mg | ORAL_TABLET | Freq: Every evening | ORAL | Status: DC | PRN

## 2012-07-10 NOTE — Telephone Encounter (Signed)
Patient called in requesting refill be sent by 5pm.

## 2012-07-10 NOTE — Telephone Encounter (Signed)
Please advise refill? Last RX was don eon 06-08-12 quantity 60 with 0 refills  If ok fax to [phone]

## 2012-07-10 NOTE — Telephone Encounter (Signed)
Patient called in requesting refill be sent in by 5pm.

## 2012-07-10 NOTE — Telephone Encounter (Signed)
Patient Information:  Caller Name: Italo  Phone: [phone]  Patient: William [name], William [name]  Gender: Male  DOB: 31-Dec-1960  Age: 52 Years  PCP: Marguarite Arbour (Adults only)  Office Follow Up:  Does the office need to follow up with this patient?: Yes  Instructions For The Office: Office please review regarding pt's request to have his refills completed today (pt is unsure of names of the meds- he states that pharmacy is aware of which medicines needed refilled)   Symptoms  Reason For Call & Symptoms: Pt is calling and states that he called pharmacy today for refill on 4 different Rx; he is not sure which Rx they were; he states that the pharmacy is aware and they will be contacting us for the refills; pt wants to make sure that these refills are taken care of today because he needs to pick up today; denies need for triage and denies any concerns except the need to handle refills today  Reviewed Health History In EMR: N/A  Reviewed Medications In EMR: N/A  Reviewed Allergies In EMR: N/A  Reviewed Surgeries / Procedures: N/A  Date of Onset of Symptoms: Unknown  Guideline(s) Used:  No Protocol Available - Information Only  Disposition Per Guideline:   Home Care  Reason For Disposition Reached:   Information only question and nurse able to answer  Advice Given:  Call Back If:  New symptoms develop  Patient Will Follow Care Advice:  YES

## 2012-07-10 NOTE — Telephone Encounter (Signed)
Refill- alprazolam 1mg  tablet. Take two tablets by mouth at bedtime as needed for sleep. Qty 60 last fill 5.5.14

## 2012-07-13 LAB — PSA: PSA: 0.4 ng/mL (ref <=4.00)

## 2012-07-13 LAB — CBC WITH DIFFERENTIAL/PLATELET
Basophils Absolute: 0 10*3/uL (ref 0.0–0.1)
HCT: 38.3 % — ABNORMAL LOW (ref 39.0–52.0)
Hemoglobin: 13.4 g/dL (ref 13.0–17.0)
Lymphocytes Relative: 36 % (ref 12–46)
Monocytes Absolute: 0.4 10*3/uL (ref 0.1–1.0)
Monocytes Relative: 7 % (ref 3–12)
Neutro Abs: 3.2 10*3/uL (ref 1.7–7.7)
Neutrophils Relative %: 54 % (ref 43–77)
RDW: 14.6 % (ref 11.5–15.5)
WBC: 6 10*3/uL (ref 4.0–10.5)

## 2012-07-13 LAB — BASIC METABOLIC PANEL
BUN: 18 mg/dL (ref 6–23)
Chloride: 105 mEq/L (ref 96–112)
Potassium: 4.3 mEq/L (ref 3.5–5.3)
Sodium: 140 mEq/L (ref 135–145)

## 2012-07-13 LAB — HEPATIC FUNCTION PANEL
ALT: 33 U/L (ref 0–53)
AST: 29 U/L (ref 0–37)
Albumin: 4.1 g/dL (ref 3.5–5.2)
Alkaline Phosphatase: 55 U/L (ref 39–117)
Total Bilirubin: 0.4 mg/dL (ref 0.3–1.2)
Total Protein: 6.5 g/dL (ref 6.0–8.3)

## 2012-07-13 LAB — LIPID PANEL
HDL: 30 mg/dL — ABNORMAL LOW (ref >39)
Total CHOL/HDL Ratio: 4.6 Ratio
Triglycerides: 156 mg/dL — ABNORMAL HIGH (ref <150)

## 2012-07-13 LAB — HEMOGLOBIN A1C: Mean Plasma Glucose: 174 mg/dL — ABNORMAL HIGH (ref <117)

## 2012-07-16 ENCOUNTER — Telehealth: Payer: Self-pay | Admitting: Internal Medicine

## 2012-07-16 NOTE — Telephone Encounter (Signed)
Pt.notified

## 2012-07-16 NOTE — Telephone Encounter (Signed)
Patient is requesting A1c results from [facility]

## 2012-07-17 ENCOUNTER — Encounter: Payer: Self-pay | Admitting: Family

## 2012-07-17 ENCOUNTER — Ambulatory Visit (INDEPENDENT_AMBULATORY_CARE_PROVIDER_SITE_OTHER): Payer: BC Managed Care – PPO | Admitting: Family

## 2012-07-17 VITALS — BP 130/80 | HR 64 | Temp 98.0°F | Resp 16 | Ht 72.0 in | Wt 225.0 lb

## 2012-07-17 DIAGNOSIS — T300 Burn of unspecified body region, unspecified degree: Secondary | ICD-10-CM | POA: Insufficient documentation

## 2012-07-17 DIAGNOSIS — E349 Endocrine disorder, unspecified: Secondary | ICD-10-CM

## 2012-07-17 DIAGNOSIS — E785 Hyperlipidemia, unspecified: Secondary | ICD-10-CM

## 2012-07-17 DIAGNOSIS — F4322 Adjustment disorder with anxiety: Secondary | ICD-10-CM

## 2012-07-17 DIAGNOSIS — E291 Testicular hypofunction: Secondary | ICD-10-CM

## 2012-07-17 DIAGNOSIS — I10 Essential (primary) hypertension: Secondary | ICD-10-CM

## 2012-07-17 DIAGNOSIS — E119 Type 2 diabetes mellitus without complications: Secondary | ICD-10-CM

## 2012-07-17 DIAGNOSIS — Z23 Encounter for immunization: Secondary | ICD-10-CM

## 2012-07-17 DIAGNOSIS — T3131 Burns involving 30-39% of body surface with 10-19% third degree burns: Secondary | ICD-10-CM

## 2012-07-17 MED ORDER — TESTOSTERONE 20.25 MG/1.25GM (1.62%) TD GEL: 3.0000 | Freq: Every day | TRANSDERMAL | Status: DC

## 2012-07-17 MED ORDER — SILVER SULFADIAZINE 1 % EX CREA: TOPICAL_CREAM | Freq: Every day | CUTANEOUS | Status: DC

## 2012-07-17 NOTE — Assessment & Plan Note (Signed)
Will rx silvadene.

## 2012-07-17 NOTE — Patient Instructions (Addendum)
Please return to the lab in 1 month for a testosterone level.   Follow up in 3 months for an office visit.
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
## 2012-07-18 ENCOUNTER — Encounter: Payer: Self-pay | Admitting: Family Medicine

## 2012-07-18 ENCOUNTER — Ambulatory Visit (INDEPENDENT_AMBULATORY_CARE_PROVIDER_SITE_OTHER): Payer: BC Managed Care – PPO | Admitting: Family Medicine

## 2012-07-18 VITALS — BP 138/78 | HR 87 | Temp 98.1°F

## 2012-07-18 DIAGNOSIS — W268XXA Contact with other sharp object(s), not elsewhere classified, initial encounter: Secondary | ICD-10-CM

## 2012-07-18 DIAGNOSIS — S8990XA Unspecified injury of unspecified lower leg, initial encounter: Secondary | ICD-10-CM

## 2012-07-18 DIAGNOSIS — S99919A Unspecified injury of unspecified ankle, initial encounter: Secondary | ICD-10-CM

## 2012-07-18 DIAGNOSIS — S99929A Unspecified injury of unspecified foot, initial encounter: Secondary | ICD-10-CM | POA: Insufficient documentation

## 2012-07-18 DIAGNOSIS — S99922S Unspecified injury of left foot, sequela: Secondary | ICD-10-CM

## 2012-07-18 LAB — MICROALBUMIN / CREATININE URINE RATIO
Creatinine, Urine: 56.4 mg/dL
Microalb Creat Ratio: 8.9 mg/g (ref 0.0–30.0)
Microalb, Ur: 0.5 mg/dL (ref 0.00–1.89)

## 2012-07-18 MED ORDER — AMOXICILLIN-POT CLAVULANATE 875-125 MG PO TABS: 1.0000 | ORAL_TABLET | Freq: Two times a day (BID) | ORAL | Status: DC

## 2012-07-18 NOTE — Patient Instructions (Signed)
I would start the augmentin today, take it twice a day.  Keep the area clean and covered, wear shoes and notify the clinic if any redness, fever, draining pus or increase in pain.

## 2012-07-18 NOTE — Progress Notes (Signed)
Walking barefoot yesterday, partially stepped on a nail on the L heel.  He lifted up before full depth penetration; it was a shallow penetration.  Removed easily.  Treated with peroxide.  Tender now walking.  No fevers.  Tetanus 2011.  The nail was removed completely and the tip is still intact on the nail (he brought it in).  Meds, vitals, and allergies reviewed.   ROS: See HPI.  Otherwise, noncontributory.  nad L foot with normal inspection except for L heel with small superficial defect.  No FB seen with maginfication.  Minimally ttp on exam.  No erythema.

## 2012-07-18 NOTE — Assessment & Plan Note (Signed)
Superficial, no FB.  Okay for outpatient fu.  Would start augmentin given DM.  This appears to be a shallow and uncomplicated injury.  D/w pt about follow up. Td up to date.  He agrees.

## 2012-07-19 ENCOUNTER — Encounter: Payer: Self-pay | Admitting: Family

## 2012-07-20 NOTE — Assessment & Plan Note (Signed)
Testosterone level low, increase from 3 to 4 pumps androgel.

## 2012-07-20 NOTE — Assessment & Plan Note (Signed)
BP Readings from Last 3 Encounters:  07/18/12 138/78  07/17/12 130/80  03/24/12 126/80   BP stable on altace, continue same.

## 2012-07-20 NOTE — Assessment & Plan Note (Signed)
LDL at goal.  Trigs were mildly elevated, continue simvastatin and fenofibrate.

## 2012-07-20 NOTE — Assessment & Plan Note (Signed)
Stable on current meds.  Continue same. 

## 2012-07-20 NOTE — Assessment & Plan Note (Addendum)
Currently maintained on novolog, lantus, glucophage.  Obtain urine microalbumin.

## 2012-07-21 ENCOUNTER — Telehealth: Payer: Self-pay | Admitting: *Deleted

## 2012-07-21 DIAGNOSIS — E291 Testicular hypofunction: Secondary | ICD-10-CM | POA: Insufficient documentation

## 2012-07-21 NOTE — Telephone Encounter (Signed)
Message copied by Deatra [name] on Tue Jul 21, 2012  9:22 AM ------      Message from: O'SULLIVAN, MELISSA      Created: Fri Jul 17, 2012 10:09 AM       Pt will return to lab in 1 month for testosterone dx hypogonadism. ------

## 2012-07-21 NOTE — Telephone Encounter (Signed)
Entered lab...lmb 

## 2012-07-27 ENCOUNTER — Telehealth: Payer: Self-pay | Admitting: *Deleted

## 2012-07-27 NOTE — Telephone Encounter (Signed)
Pt was evaluated on 07/18/12 and treated.  [facility] Triage Call Report Triage Record Num: [id number] Operator: Cornell Barman Patient Name: William [name] Call Date & Time: 07/18/2012 8:21:32AM Patient Phone: [phone] PCP: Marguarite Arbour Patient Gender: Male PCP Fax : [phone] Patient DOB: 1960-10-16 Practice Name: [facility] Reason for Call: Caller: William [name]/Patient; PCP: Marguarite Arbour (Adults only); CB#: [phone]; Call regarding Stepped on Nail yesterday 06/17/12; at 16:00 pm.PATIENT IS DIABETIC CALLING FOR APPT. [name] Dirty nail. Location- Left foot at heel. Mild swelling at puncture site. Pain with weight bearing . Tetanus good till 2020. Cleansed area "injected with peroxide and cleanesed" with his Diabetic needle. Pain scale 3/10 . "throbs". Emergent s/sx ruled out per Abrasions, Lacerations and Puncture wounds Protocol with the exception to "any signs of symptoms worsening soft tissue infections and went through layers of skin". See provider in 4 hours. Appt scheduled at [facility] office today 06/17/12 at [facility] office with Dr. Para March 11:00 am . Patient expressed understanding. REVIEWED medications allergy and history in EPIC. Protocol(s) Used: Abrasions, Lacerations, Puncture Wounds Recommended Outcome per Protocol: See Provider within 4 hours Reason for Outcome: Any signs and symptoms of worsening soft tissue infection Care Advice: ~ SYMPTOM / CONDITION MANAGEMENT

## 2012-08-06 ENCOUNTER — Other Ambulatory Visit: Payer: Self-pay | Admitting: Family

## 2012-08-06 ENCOUNTER — Telehealth: Payer: Self-pay | Admitting: Internal Medicine

## 2012-08-06 NOTE — Telephone Encounter (Signed)
Rxs faxed at 4:30pm.

## 2012-08-06 NOTE — Telephone Encounter (Signed)
Patient called in stating that he needs refills of simvastatin, sertraline, and alprazolam to be sent to [facility] pharmacy. He states that he is out and would like refills to be sent before 5pm today.

## 2012-08-13 ENCOUNTER — Other Ambulatory Visit: Payer: Self-pay

## 2012-08-25 ENCOUNTER — Telehealth: Payer: Self-pay | Admitting: Internal Medicine

## 2012-08-25 NOTE — Telephone Encounter (Signed)
Patient states that he has questions regarding his medication. He would like a callback about this.

## 2012-08-25 NOTE — Telephone Encounter (Signed)
Left message on voicemail to return my call.  

## 2012-08-26 ENCOUNTER — Ambulatory Visit (INDEPENDENT_AMBULATORY_CARE_PROVIDER_SITE_OTHER): Payer: BC Managed Care – PPO | Admitting: Physician Assistant

## 2012-08-26 ENCOUNTER — Encounter: Payer: Self-pay | Admitting: Physician Assistant

## 2012-08-26 VITALS — BP 110/64 | HR 84 | Temp 97.9°F | Resp 16 | Wt 224.5 lb

## 2012-08-26 DIAGNOSIS — E349 Endocrine disorder, unspecified: Secondary | ICD-10-CM

## 2012-08-26 DIAGNOSIS — E291 Testicular hypofunction: Secondary | ICD-10-CM

## 2012-08-26 MED ORDER — TESTOSTERONE 20.25 MG/1.25GM (1.62%) TD GEL: 4.0000 | Freq: Every day | TRANSDERMAL | Status: DC

## 2012-08-26 NOTE — Patient Instructions (Signed)
Increase Androgel from 3 pumps to 4 pumps.  Obtain Testosterone Levels today.  Will follow-up in 3 months or sooner if needed.  Testosterone skin gel What is this medicine? TESTOSTERONE (tes TOS ter one) is the main male hormone. It supports normal male traits such as muscle growth, facial hair, and deep voice. This gel is used in males to treat low testosterone levels. This medicine may be used for other purposes; ask your health care provider or pharmacist if you have questions. What should I tell my health care provider before I take this medicine? They need to know if you have any of these conditions: -breast cancer -diabetes -heart disease -if a male partner is pregnant or trying to get pregnant -kidney disease -liver disease -lung disease -prostate cancer, enlargement -an unusual or allergic reaction to testosterone, soy proteins, other medicines, foods, dyes, or preservatives -pregnant or trying to get pregnant -breast-feeding How should I use this medicine? This medicine is for external use only. This medicine is applied at the same time every day (preferably in the morning) to clean, dry, intact skin. If you take a bath or shower in the morning, apply the gel after the bath or shower. Follow the directions on the prescription label. Make sure that you are using your testosterone gel product correctly and applying it only to the appropriate skin area (see below). Allow the skin to dry a few minutes then cover with clothing to prevent others from coming in contact with the medicine on your skin. The gel is flammable. Avoid fire, flame, or smoking until the gel has dried. Wash your hands with soap and water after use. For AndroGel Packets: Open the packet(s) needed for your dose. You can put the entire dose into your palm all at once or just a little at a time to apply. If you prefer, you can instead squeeze the gel directly onto the area you are applying it to. Apply on the shoulders,  upper arm, or abdomen as directed. Do not apply to the scrotum or genitals. Be sure you use the correct total dose. It is best to wait 5 to 6 hours after application of the gel before showering or swimming. For AndroGel 1%: Pump the dose into the palm of your hand. You can put the entire dose into your palm all at once or just a little at a time to apply. If you prefer, you can instead pump the gel directly onto the area you are applying it to. Apply on the shoulders, upper arm, or abdomen as directed. Do not apply to the scrotum or genitals. Be sure you use the correct total dose. It is best to wait for 5 to 6 hours after application of the gel before showering or swimming. For AndroGel 1.62%: Pump the dose into the palm of your hand. Dispense one pump of gel at a time into the palm of your hand before applying it. If you prefer, you can instead pump the gel directly onto the area you are applying it to. Apply on the shoulders and upper arms as directed. Do not apply to other parts of the body including the abdomen or genitals. Be sure you use the correct total dose. It is best to wait 2 hours after application of the gel before washing, showering, or swimming. For Testim: Open the tube(s) needed for your dose. Squeeze the gel from the tube into the palm of your hand. Apply on the shoulders or upper arms as directed. Do not  apply to the scrotum, genitals, or abdomen. Be sure you use the correct total dose. Do not shower or swim for at least 2 hours after application of the gel. For Fortesta: Use the multi-dose pump to pump the gel directly onto the area you are applying it to. Apply on the thighs as directed. Do not apply to the abdomen, penis, scrotum, shoulders or upper arms. Gently rub the gel onto the skin using your finger. Be sure you use the correct total dose. Do not shower or swim for at least 2 hours after application of the gel. A special MedGuide will be given to you by the pharmacist with each  prescription and refill. Be sure to read this information carefully each time. Talk to your pediatrician regarding the use of this medicine in children. Special care may be needed. Overdosage: If you think you have taken too much of this medicine contact a poison control center or emergency room at once. NOTE: This medicine is only for you. Do not share this medicine with others. What if I miss a dose? If you miss a dose, use it as soon as you can. If it is almost time for your next dose, use only that dose. Do not use double or extra doses. What may interact with this medicine? -medicines for diabetes -medicines that treat or prevent blood clots like warfarin -oxyphenbutazone -propranolol -steroid medicines like prednisone or cortisone This list may not describe all possible interactions. Give your health care provider a list of all the medicines, herbs, non-prescription drugs, or dietary supplements you use. Also tell them if you smoke, drink alcohol, or use illegal drugs. Some items may interact with your medicine. What should I watch for while using this medicine? Visit your doctor or health care professional for regular checks on your progress. They will need to check the level of testosterone in your blood. This medicine can transfer from your body to others. If a person or pet comes in contact with the area where this medicine was applied to your skin, they may have a serious risk of side effects. If you cannot avoid skin-to-skin contact with another person, make sure the site where this medicine was applied is covered with clothing. If accidental contact happens, the skin of the person or pet should be washed right away with soap and water. Also, a male partner who is pregnant or trying to get pregnant should avoid contact with the gel or treated skin. This medicine may affect blood sugar levels. If you have diabetes, check with your doctor or health care professional before you change your  diet or the dose of your diabetic medicine. This drug is banned from use in athletes by most athletic organizations. What side effects may I notice from receiving this medicine? Side effects that you should report to your doctor or health care professional as soon as possible: -allergic reactions like skin rash, itching or hives, swelling of the face, lips, or tongue -breast enlargement -breathing problems -changes in mood, especially anger, depression, or rage -dark urine -general ill feeling or flu-like symptoms -light-colored stools -loss of appetite, nausea -nausea, vomiting -right upper belly pain -stomach pain -swelling of ankles -too frequent or persistent erections -trouble passing urine or change in the amount of urine -unusually weak or tired -yellowing of the eyes or skin Side effects that usually do not require medical attention (report to your doctor or health care professional if they continue or are bothersome): -acne -change in sex drive or  performance -hair loss -headache This list may not describe all possible side effects. Call your doctor for medical advice about side effects. You may report side effects to FDA at 1-800-FDA-1088. Where should I keep my medicine? Keep out of the reach of children. This medicine can be abused. Keep your medicine in a safe place to protect it from theft. Do not share this medicine with anyone. Selling or giving away this medicine is dangerous and against the law. Store at room temperature between 15 to 30 degrees C (59 to 86 degrees F). Keep closed until use. Protect from heat and light. This medicine is flammable. Avoid exposure to heat, fire, flame, and smoking. Throw away any unused medicine after the expiration date. NOTE: This sheet is a summary. It may not cover all possible information. If you have questions about this medicine, talk to your doctor, pharmacist, or health care provider.  2012, Elsevier/Gold Standard. (06/05/2009  4:45:50 PM)

## 2012-08-26 NOTE — Telephone Encounter (Signed)
Pt seen in office on 08/26/12. Phone note closed out.

## 2012-08-26 NOTE — Assessment & Plan Note (Signed)
Obtain lab work today.  Prescription for Androgel refilled. Increase from 3 to 4 pumps daily as previously directed.

## 2012-08-26 NOTE — Progress Notes (Signed)
Patient ID: William [name], male   DOB: 1960/11/05, 52 y.o.   MRN: [id number]   Patient presents to clinic today with questions about his testosterone medication.  Patient states he does not feel that his testosterone levels are rising adequately enough.  He states he feels exhausted after exercise regimen.  Exercise regime reported by patient includes several hours of intense workout, including cardio, strength-training and "flipping tractor wheels"  States he should not feel so tired afterwards.  Patient does have long-term history of low testosterone for which he takes Androgel.  Patient was seen by NP [name] in recent past for the same issue.  Was told to increase from 3 pumps/day to 4 pumps/day.  Patient states he was unaware of this.  Is requesting we check his Testosterone every 2-4 weeks, and is asking about addition of testosterone injection plus his androgel.  Patient otherwise doing well.  Denies fatigue on days when he does not exercise extensively.  Denies ED, but notes some recent anorgasmia.    Past Medical History  Diagnosis Date  . Asthma     history  . Arthritis   . History of chicken pox   . Adjustment disorder with mixed anxiety and depressed mood   . Diabetes mellitus     type 2  . Elevated blood pressure     history of high blood pressure readings  . Hypercholesteremia    Review of Systems  Constitutional: Negative.   HENT: Negative for neck pain.   Musculoskeletal: Negative for myalgias.  Psychiatric/Behavioral: Negative for depression, suicidal ideas and memory loss.   Physical Exam: (1)General -- well-developed, well-nourished in NAD (2)Neuro -- A/O x 3 (3) Psych -- mood and affect are appropriate  Labs -- patient to obtain Testosterone level today as previously ordered.  Assessment/Plan: (1) Low Testosterone -- Patient to continue Androgel, increasing from 3 to 4 pumps as previously directed at last visit.  Will review labs and notify patient of  results.  Patient instructed on appropriate administration of Gel for maximum efficacy.

## 2012-09-03 ENCOUNTER — Other Ambulatory Visit: Payer: Self-pay | Admitting: Family Medicine

## 2012-09-04 ENCOUNTER — Telehealth: Payer: Self-pay | Admitting: *Deleted

## 2012-09-04 DIAGNOSIS — G4700 Insomnia, unspecified: Secondary | ICD-10-CM

## 2012-09-04 MED ORDER — ALPRAZOLAM 1 MG PO TABS: ORAL_TABLET | ORAL | Status: DC

## 2012-09-04 NOTE — Telephone Encounter (Addendum)
I will refill Alprazolam.  Thank you for routing this to me.

## 2012-09-04 NOTE — Telephone Encounter (Signed)
Left msg on vm stating received 3 refills on other meds yesterday. Alprazolam we did not received. Checking status on refill...William [name]

## 2012-09-04 NOTE — Telephone Encounter (Signed)
Called pharmacy spoke with [facility] gave [place] approval...lmb

## 2012-09-04 NOTE — Telephone Encounter (Signed)
You just saw this fella, were you willing to continue his Alprazolam? I have not seen him so I defer to you, if you need me to write this I am more than willing

## 2012-09-18 ENCOUNTER — Telehealth: Payer: Self-pay | Admitting: *Deleted

## 2012-09-18 NOTE — Telephone Encounter (Signed)
Received fax for Testosterone, per pharmacy [Kim], pt is requesting early refill--Denied per provider; will Hold until due for refill/SLS

## 2012-09-22 MED ORDER — TESTOSTERONE 20.25 MG/1.25GM (1.62%) TD GEL: 4.0000 | Freq: Every day | TRANSDERMAL | Status: DC

## 2012-09-22 NOTE — Telephone Encounter (Signed)
Print Rx signed & faxed to pharmacy for refill availability on 08.20.14/SLS

## 2012-10-06 ENCOUNTER — Other Ambulatory Visit: Payer: Self-pay | Admitting: Family Medicine

## 2012-10-06 DIAGNOSIS — G4700 Insomnia, unspecified: Secondary | ICD-10-CM

## 2012-10-06 MED ORDER — ALPRAZOLAM 1 MG PO TABS: ORAL_TABLET | ORAL | Status: DC

## 2012-10-06 NOTE — Telephone Encounter (Signed)
Faxed refill request received from pharmacy for Alprazolam Last filled by MD on 08.01.14, #[id number] Last AEX - 07.23.14 Next AEX - 3-mths Refills sent per [facility] refill protocol w/verbal authorization on Alprazolam/SLS

## 2012-10-08 ENCOUNTER — Encounter: Payer: Self-pay | Admitting: Family

## 2012-10-23 ENCOUNTER — Telehealth: Payer: Self-pay | Admitting: *Deleted

## 2012-10-23 NOTE — Telephone Encounter (Signed)
Faxed refill request received from pharmacy for Androgel 1.62% pump Last filled by MD on 08.20.14, 2.5g x 0  Last AEX - 07.23.14 Next AEX - 3 Months Please Advise/SLS

## 2012-10-26 ENCOUNTER — Other Ambulatory Visit: Payer: Self-pay | Admitting: *Deleted

## 2012-10-26 ENCOUNTER — Other Ambulatory Visit: Payer: Self-pay | Admitting: Family Medicine

## 2012-10-26 ENCOUNTER — Ambulatory Visit (INDEPENDENT_AMBULATORY_CARE_PROVIDER_SITE_OTHER): Payer: BC Managed Care – PPO

## 2012-10-26 ENCOUNTER — Telehealth: Payer: Self-pay | Admitting: Internal Medicine

## 2012-10-26 DIAGNOSIS — IMO0001 Reserved for inherently not codable concepts without codable children: Secondary | ICD-10-CM

## 2012-10-26 DIAGNOSIS — E785 Hyperlipidemia, unspecified: Secondary | ICD-10-CM

## 2012-10-26 DIAGNOSIS — Z23 Encounter for immunization: Secondary | ICD-10-CM

## 2012-10-26 MED ORDER — INSULIN SYRINGE-NEEDLE U-100 30G 1 ML MISC: Status: DC

## 2012-10-26 MED ORDER — TESTOSTERONE 20.25 MG/1.25GM (1.62%) TD GEL: 4.0000 | Freq: Every day | TRANSDERMAL | Status: DC

## 2012-10-26 MED ORDER — ONETOUCH LANCETS MISC: Status: DC

## 2012-10-26 MED ORDER — INSULIN PEN NEEDLE 31G X 8 MM MISC: Status: DC

## 2012-10-26 NOTE — Telephone Encounter (Signed)
See printed rx

## 2012-10-26 NOTE — Telephone Encounter (Signed)
Given to the patient when he came in for his flu injection.

## 2012-10-26 NOTE — Telephone Encounter (Signed)
Patient would like to have his cholesterol checked.  Would you put in an order for this.  And he would like an a1c

## 2012-10-26 NOTE — Telephone Encounter (Signed)
Please Advise

## 2012-10-27 ENCOUNTER — Telehealth: Payer: Self-pay | Admitting: Family

## 2012-10-27 ENCOUNTER — Encounter: Payer: Self-pay | Admitting: Family Medicine

## 2012-10-27 ENCOUNTER — Telehealth: Payer: Self-pay | Admitting: Family Medicine

## 2012-10-27 LAB — LIPID PANEL
HDL: 33 mg/dL — ABNORMAL LOW (ref >39)
LDL Cholesterol: 76 mg/dL (ref 0–99)
Total CHOL/HDL Ratio: 3.9 Ratio
Triglycerides: 99 mg/dL (ref <150)
VLDL: 20 mg/dL (ref 0–40)

## 2012-10-27 LAB — HEPATIC FUNCTION PANEL
AST: 25 U/L (ref 0–37)
Albumin: 4.4 g/dL (ref 3.5–5.2)
Indirect Bilirubin: 0.3 mg/dL (ref 0.0–0.9)
Total Protein: 6.9 g/dL (ref 6.0–8.3)

## 2012-10-27 LAB — BASIC METABOLIC PANEL
BUN: 20 mg/dL (ref 6–23)
Calcium: 9.4 mg/dL (ref 8.4–10.5)
Chloride: 105 mEq/L (ref 96–112)
Glucose, Bld: 138 mg/dL — ABNORMAL HIGH (ref 70–99)
Potassium: 4.5 mEq/L (ref 3.5–5.3)
Sodium: 138 mEq/L (ref 135–145)

## 2012-10-27 NOTE — Telephone Encounter (Signed)
Orders completed. Please call pt to arrange 30 min follow up.

## 2012-10-27 NOTE — Telephone Encounter (Signed)
I don't know why this came to me.  Please see prev phone note and either call pt or route to the PCP's clinic.  Thanks.

## 2012-10-27 NOTE — Telephone Encounter (Signed)
Left message for patient to return my call.

## 2012-10-27 NOTE — Telephone Encounter (Signed)
Patient is due for a follow up visit for his diabetes.  He can complete lab work as pended below prior to visit. Please put him in a 30 minute slot.

## 2012-10-27 NOTE — Telephone Encounter (Signed)
See my chart message

## 2012-10-28 ENCOUNTER — Encounter: Payer: Self-pay | Admitting: Physician Assistant

## 2012-10-28 ENCOUNTER — Ambulatory Visit (INDEPENDENT_AMBULATORY_CARE_PROVIDER_SITE_OTHER): Payer: BC Managed Care – PPO | Admitting: Physician Assistant

## 2012-10-28 VITALS — BP 104/72 | HR 79 | Temp 98.0°F | Resp 16 | Ht 72.0 in | Wt 213.5 lb

## 2012-10-28 DIAGNOSIS — E291 Testicular hypofunction: Secondary | ICD-10-CM

## 2012-10-28 DIAGNOSIS — E349 Endocrine disorder, unspecified: Secondary | ICD-10-CM

## 2012-10-28 DIAGNOSIS — E785 Hyperlipidemia, unspecified: Secondary | ICD-10-CM

## 2012-10-28 DIAGNOSIS — E119 Type 2 diabetes mellitus without complications: Secondary | ICD-10-CM

## 2012-10-28 NOTE — Progress Notes (Signed)
Patient ID: William [name], male   DOB: 07-Mar-1960, 52 y.o.   MRN: [id number]  Patient presents to clinic today to discuss recent labs ordered by NP Peggyann Juba.  Patient wishes to transfer care to this provider.  Results discussed with patient, allowing time for any questions he may have.  Patient expresses understanding of results and care plan.  Patient has no acute concerns after discussion of lab results.   Past Medical History  Diagnosis Date  . Asthma     history  . Arthritis   . History of chicken pox   . Adjustment disorder with mixed anxiety and depressed mood   . Diabetes mellitus     type 2  . Elevated blood pressure     history of high blood pressure readings  . Hypercholesteremia     Current Outpatient Prescriptions on File Prior to Visit  Medication Sig Dispense Refill  . ALPRAZolam (XANAX) 1 MG tablet TAKE 2 TABLETS BY MOUTH AT BEDTIME AS NEEDED FOR SLEEP  60 tablet  0  . aspirin 81 MG tablet Take 81 mg by mouth daily.      . Blood Glucose Monitoring Suppl (ONE TOUCH ULTRA SYSTEM KIT) W/DEVICE KIT 1 kit by Does not apply route once. Use to check blood sugar four times daily       . buPROPion (WELLBUTRIN XL) 150 MG 24 hr tablet TAKE 1 TABLET BY MOUTH ONCE DAILY  30 tablet  0  . fenofibrate 160 MG tablet TAKE 1 TABLET BY MOUTH ONCE DAILY  60 tablet  2  . glucose blood (ACCU-CHEK COMPACT STRIPS) test strip Use as instructed to check blood sugar eight times a day.  204 each  1  . insulin aspart (NOVOLOG FLEXPEN) 100 UNIT/ML injection Inject 25 Units into the skin 3 (three) times daily before meals.  1 pen  6  . insulin glargine (LANTUS) 100 UNIT/ML injection Inject 85 Units into the skin at bedtime.  30 mL  6  . Insulin Pen Needle (RELION PEN NEEDLE 31G/8MM) 31G X 8 MM MISC Use as Directed daily to check blood sugars  100 each  3  . Insulin Syringe-Needle U-100 30G 1 ML MISC USE AS DIRECTED TO INJECT INSULIN DX: 250.00  100 each  3  . metFORMIN (GLUCOPHAGE-XR) 500 MG 24 hr  tablet Take 1,000 mg by mouth as directed. 2 tablets in am and 1 tablet in pm      . ONE TOUCH LANCETS MISC Use as directed  200 each  11  . ramipril (ALTACE) 10 MG capsule TAKE 2 CAPSULES BY MOUTH DAILY  60 capsule  3  . sertraline (ZOLOFT) 50 MG tablet TAKE 1 TABLET BY MOUTH ONCE DAILY  30 tablet  0  . sildenafil (VIAGRA) 100 MG tablet Take 100 mg by mouth daily as needed.      . simvastatin (ZOCOR) 40 MG tablet TAKE 1 TABLET BY MOUTH AT BEDTIME  30 tablet  2  . tadalafil (CIALIS) 5 MG tablet One tablet by mouth once daily  30 tablet  2  . Testosterone (ANDROGEL) 20.25 MG/1.25GM (1.62%) GEL Place 4 Squirts onto the skin daily. FILL ON OR AFTER September 23, 2012  2.5 g  0   No current facility-administered medications on file prior to visit.    No Known Allergies  Family History  Problem Relation Age of Onset  . Diabetes Mother   . Thyroid disease Mother     Questionable  . Physical abuse Daughter  physical abuse as a child  . Heart disease Father     deceased    History   Social History  . Marital Status: Married    Spouse Name: N/A    Number of Children: N/A  . Years of Education: N/A   Social History Main Topics  . Smoking status: Never Smoker   . Smokeless tobacco: Never Used  . Alcohol Use: None  . Drug Use: None  . Sexual Activity: None   Other Topics Concern  . None   Social History Narrative   Occupation: Scientist, research (physical sciences)   Married -15 marriage (2nd marriage)   daughter 71,  2 sons (2nd marriage  11,6)   [place]   Never Smoked    Alcohol use-yes   Drug use-no    Regular exercise-no   Smoking Status:  never   Does Patient Exercise:  no   Caffeine use/day:  3-4 cups coffee daily   Drug Use:  no         Review of Systems  Constitutional: Negative for malaise/fatigue.  Eyes: Negative for blurred vision, double vision and photophobia.  Cardiovascular: Negative for chest pain and palpitations.  Genitourinary: Negative for frequency.  Neurological:  Negative for tingling, sensory change and headaches.   Filed Vitals:   10/28/12 1341  BP: 104/72  Pulse: 79  Temp: 98 F (36.7 C)  Resp: 16   Physical Exam  Vitals reviewed. Constitutional: He is oriented to person, place, and time and well-developed, well-nourished, and in no distress.  HENT:  Head: Normocephalic and atraumatic.  Eyes: Conjunctivae are normal.  Neck: Neck supple.  Neurological: He is alert and oriented to person, place, and time.  Skin: Skin is warm and dry. No rash noted.    Diabetic Foot Exam performed and WNL  Recent Results (from the past 2160 hour(s))  TESTOSTERONE     Status: None   Collection Time    08/26/12  2:44 PM      Result Value Range   Testosterone 350  300 - 890 ng/dL   Comment:           Tanner Stage       Male              Male                   I              < 30 ng/dL        < 10 ng/dL                   II             < 150 ng/dL       < 30 ng/dL                   III            100-320 ng/dL     < 35 ng/dL                   IV             200-970 ng/dL     16-10 ng/dL                   V/Adult        300-890 ng/dL     96-04 ng/dL        HEMOGLOBIN V4U  Status: Abnormal   Collection Time    10/27/12  4:15 PM      Result Value Range   Hemoglobin A1C 7.7 (*) <5.7 %   Comment:                                                                            According to the ADA Clinical Practice Recommendations for 2011, when     HbA1c is used as a screening test:             >=6.5%   Diagnostic of Diabetes Mellitus                (if abnormal result is confirmed)           5.7-6.4%   Increased risk of developing Diabetes Mellitus           References:Diagnosis and Classification of Diabetes Mellitus,Diabetes     Care,2011,34(Suppl 1):S62-S69 and Standards of Medical Care in             Diabetes - 2011,Diabetes Care,2011,34 (Suppl 1):S11-S61.         Mean Plasma Glucose 174 (*) <117 mg/dL  BASIC METABOLIC PANEL     Status:  Abnormal   Collection Time    10/27/12  4:15 PM      Result Value Range   Sodium 138  135 - 145 mEq/L   Potassium 4.5  3.5 - 5.3 mEq/L   Chloride 105  96 - 112 mEq/L   CO2 27  19 - 32 mEq/L   Glucose, Bld 138 (*) 70 - 99 mg/dL   BUN 20  6 - 23 mg/dL   Creat 1.61  0.96 - 0.45 mg/dL   Calcium 9.4  8.4 - 40.9 mg/dL  LIPID PANEL     Status: Abnormal   Collection Time    10/27/12  4:15 PM      Result Value Range   Cholesterol 129  0 - 200 mg/dL   Comment: ATP III Classification:           < 200        mg/dL        Desirable          200 - 239     mg/dL        Borderline High          >= 240        mg/dL        High         Triglycerides 99  <150 mg/dL   HDL 33 (*) >81 mg/dL   Total CHOL/HDL Ratio 3.9     VLDL 20  0 - 40 mg/dL   LDL Cholesterol 76  0 - 99 mg/dL   Comment:       Total Cholesterol/HDL Ratio:CHD Risk                            Coronary Heart Disease Risk Table  Men       Women              1/2 Average Risk              3.4        3.3                  Average Risk              5.0        4.4               2X Average Risk              9.6        7.1               3X Average Risk             23.4       11.0     Use the calculated Patient Ratio above and the CHD Risk table      to determine the patient's CHD Risk.     ATP III Classification (LDL):           < 100        mg/dL         Optimal          100 - 129     mg/dL         Near or Above Optimal          130 - 159     mg/dL         Borderline High          160 - 189     mg/dL         High           > 190        mg/dL         Very High        HEPATIC FUNCTION PANEL     Status: None   Collection Time    10/27/12  4:15 PM      Result Value Range   Total Bilirubin 0.4  0.3 - 1.2 mg/dL   Bilirubin, Direct 0.1  0.0 - 0.3 mg/dL   Indirect Bilirubin 0.3  0.0 - 0.9 mg/dL   Alkaline Phosphatase 48  39 - 117 U/L   AST 25  0 - 37 U/L   ALT 28  0 - 53 U/L   Total Protein 6.9   6.0 - 8.3 g/dL   Albumin 4.4  3.5 - 5.2 g/dL    Assessment/Plan: Testosterone deficiency Patient testosterone back in the normal range as of 08/2012.  Will continue therapy and recheck in 3 months.  Patient educated on importance of not having too high of testosterone levels due to increased risks.  DIABETES-TYPE 2 Continue current regimen.  Will recheck A1C in 3 months.  Diabetic foot exam performed and was WNL  HYPERLIPIDEMIA Discussed reults with patient.  Patient commended on his continued efforts at a healthier lifestyle.

## 2012-10-28 NOTE — Assessment & Plan Note (Signed)
Continue current regimen.  Will recheck A1C in 3 months.  Diabetic foot exam performed and was WNL

## 2012-10-28 NOTE — Assessment & Plan Note (Signed)
Discussed reults with patient.  Patient commended on his continued efforts at a healthier lifestyle.

## 2012-10-28 NOTE — Assessment & Plan Note (Signed)
Patient testosterone back in the normal range as of 08/2012.  Will continue therapy and recheck in 3 months.  Patient educated on importance of not having too high of testosterone levels due to increased risks.

## 2012-10-28 NOTE — Patient Instructions (Signed)
Please continue medications as prescribed. I will want to see you in 3 months (Dec/Jan).  Please call or return to clinic if you need [place] in the mean time.  Diabetes and Exercise Regular exercise is important and can help:   Control blood glucose (sugar).  Decrease blood pressure.    Control blood lipids (cholesterol, triglycerides).  Improve overall health. BENEFITS FROM EXERCISE  Improved fitness.  Improved flexibility.  Improved endurance.  Increased bone density.  Weight control.  Increased muscle strength.  Decreased body fat.  Improvement of the body's use of insulin, a hormone.  Increased insulin sensitivity.  Reduction of insulin needs.  Reduced stress and tension.  Helps you feel better. People with diabetes who add exercise to their lifestyle gain additional benefits, including:  Weight loss.  Reduced appetite.  Improvement of the body's use of blood glucose.  Decreased risk factors for heart disease:  Lowering of cholesterol and triglycerides.  Raising the level of good cholesterol (high-density lipoproteins, HDL).  Lowering blood sugar.  Decreased blood pressure. TYPE 1 DIABETES AND EXERCISE  Exercise will usually lower your blood glucose.  If blood glucose is greater than 240 mg/dl, check urine ketones. If ketones are present, do not exercise.  Location of the insulin injection sites may need to be adjusted with exercise. Avoid injecting insulin into areas of the body that will be exercised. For example, avoid injecting insulin into:  The arms when playing tennis.  The legs when jogging. For more information, discuss this with your caregiver.  Keep a record of:  Food intake.  Type and amount of exercise.  Expected peak times of insulin action.  Blood glucose levels. Do this before, during, and after exercise. Review your records with your caregiver. This will help you to develop guidelines for adjusting food intake and insulin  amounts.  TYPE 2 DIABETES AND EXERCISE  Regular physical activity can help control blood glucose.  Exercise is important because it may:  Increase the body's sensitivity to insulin.  Improve blood glucose control.  Exercise reduces the risk of heart disease. It decreases serum cholesterol and triglycerides. It also lowers blood pressure.  Those who take insulin or oral hypoglycemic agents should watch for signs of hypoglycemia. These signs include dizziness, shaking, sweating, chills, and confusion.  Body water is lost during exercise. It must be replaced. This will help to avoid loss of body fluids (dehydration) or heat stroke. Be sure to talk to your caregiver before starting an exercise program to make sure it is safe for you. Remember, any activity is better than none.  Document Released: 04/13/2003 Document Revised: 04/15/2011 Document Reviewed: 07/28/2008 [facility] Patient Information 2014 [place], [place].

## 2012-10-28 NOTE — Telephone Encounter (Signed)
Noted that pt has appointment this afternoon with Malva Cogan. Will let him discuss with pt.

## 2012-10-28 NOTE — Telephone Encounter (Signed)
Patient wanted to switch care from [place] to [place]. Scheduled appointment for this afternoon.

## 2012-10-30 NOTE — Telephone Encounter (Signed)
Patient was seen in clinic on 10/28/12 and all issues were addressed at that time.  Thank you for forwarding this to [place].

## 2012-11-04 ENCOUNTER — Other Ambulatory Visit: Payer: Self-pay | Admitting: Family Medicine

## 2012-11-04 ENCOUNTER — Encounter: Payer: Self-pay | Admitting: Physician Assistant

## 2012-11-04 ENCOUNTER — Other Ambulatory Visit: Payer: Self-pay | Admitting: Physician Assistant

## 2012-11-04 DIAGNOSIS — G4700 Insomnia, unspecified: Secondary | ICD-10-CM

## 2012-11-04 MED ORDER — SERTRALINE HCL 50 MG PO TABS: ORAL_TABLET | ORAL | Status: DC

## 2012-11-04 MED ORDER — BUPROPION HCL ER (XL) 150 MG PO TB24: ORAL_TABLET | ORAL | Status: DC

## 2012-11-04 MED ORDER — ALPRAZOLAM 1 MG PO TABS: ORAL_TABLET | ORAL | Status: DC

## 2012-11-04 NOTE — Telephone Encounter (Signed)
Rx request faxed to pharmacy/SLS  

## 2012-11-05 MED ORDER — RAMIPRIL 10 MG PO CAPS: ORAL_CAPSULE | ORAL | Status: DC

## 2012-11-06 NOTE — Telephone Encounter (Signed)
Denied-Rx request already responded to by other means; faxed 10.01.14/SLS

## 2012-11-20 ENCOUNTER — Other Ambulatory Visit: Payer: Self-pay | Admitting: *Deleted

## 2012-11-20 MED ORDER — TESTOSTERONE 20.25 MG/1.25GM (1.62%) TD GEL: 4.0000 | Freq: Every day | TRANSDERMAL | Status: DC

## 2012-11-20 NOTE — Telephone Encounter (Signed)
Faxed refill request received from pharmacy for Androgel Last filled by MD on 09.22.14, [id number] Last AEX - 09.24.14 Next AEX - 3 Months Refill sent per [facility] refill protocol; provider aware/SLS

## 2012-12-04 ENCOUNTER — Other Ambulatory Visit: Payer: Self-pay | Admitting: Physician Assistant

## 2012-12-04 ENCOUNTER — Other Ambulatory Visit: Payer: Self-pay | Admitting: Family

## 2012-12-04 DIAGNOSIS — G4700 Insomnia, unspecified: Secondary | ICD-10-CM

## 2012-12-04 MED ORDER — ALPRAZOLAM 1 MG PO TABS: ORAL_TABLET | ORAL | Status: DC

## 2012-12-04 NOTE — Telephone Encounter (Signed)
Faxed refill request received from pharmacy for Alprazolam Last filled by MD on 10.01.14, #[id number] Last AEX - 09.24.14 Next AEX - 3 months Please Advise/SLS  Rx request to pharmacy/SLS

## 2012-12-09 ENCOUNTER — Ambulatory Visit (INDEPENDENT_AMBULATORY_CARE_PROVIDER_SITE_OTHER): Payer: BC Managed Care – PPO | Admitting: Physician Assistant

## 2012-12-09 ENCOUNTER — Encounter: Payer: Self-pay | Admitting: Physician Assistant

## 2012-12-09 VITALS — BP 114/72 | HR 85 | Temp 97.9°F | Resp 16 | Ht 72.0 in | Wt 206.0 lb

## 2012-12-09 DIAGNOSIS — F4322 Adjustment disorder with anxiety: Secondary | ICD-10-CM

## 2012-12-09 DIAGNOSIS — F411 Generalized anxiety disorder: Secondary | ICD-10-CM

## 2012-12-09 DIAGNOSIS — K219 Gastro-esophageal reflux disease without esophagitis: Secondary | ICD-10-CM

## 2012-12-09 DIAGNOSIS — F419 Anxiety disorder, unspecified: Secondary | ICD-10-CM

## 2012-12-09 DIAGNOSIS — E119 Type 2 diabetes mellitus without complications: Secondary | ICD-10-CM

## 2012-12-09 MED ORDER — OMEPRAZOLE 20 MG PO CPDR: 20.0000 mg | DELAYED_RELEASE_CAPSULE | Freq: Every day | ORAL | Status: DC

## 2012-12-09 MED ORDER — SERTRALINE HCL 50 MG PO TABS: 75.0000 mg | ORAL_TABLET | Freq: Every day | ORAL | Status: DC

## 2012-12-09 MED ORDER — METFORMIN HCL ER 500 MG PO TB24: 1000.0000 mg | ORAL_TABLET | ORAL | Status: DC

## 2012-12-09 NOTE — Patient Instructions (Signed)
Continue medications as prescribed.  Follow-up in December.  Return sooner as needed.

## 2012-12-09 NOTE — Assessment & Plan Note (Signed)
Rx omeprazole 20 mg daily. Will attempt a trial of PPI for 4 weeks. Follow up in one month.

## 2012-12-09 NOTE — Progress Notes (Signed)
Patient ID: William [name], male   DOB: 1960-02-05, 52 y.o.   MRN: [id number]  Patient presents to clinic today to discuss his medications and to talk about acid reflux.  For Medication Management:       Patient currently takes 1 150 mg tablet a Wellbutrin XL daily.  Patient is wondering if he can switch to taking a tablet at bedtime.  Is doing well on the medication.  Patient also was to discuss the dosage change in his Zoloft. Patient currently on 50 mg daily.  Patient states that his anxiety is somewhat controlled but he feels like the medication is not 100% effective.  Patient is wondering if he can double his Zoloft prescription.  Patient states that he has seen a therapist in the past for his anxiety. Does not wish to see another therapist at present time. The patient denies panic attack, depressive mood, suicidal ideations.  For Acid Reflux:     Patient states that he recently went to the dentist. Dentist noted that the patient had some wearing of his molars and lower teeth. The didn't seem to think the patient may benefit from acid reflux medication. Patient states he does have heartburn, more pronounced when he played at night. Patient also endorses sensations of globus and halitosis.  Patient has recently increased his exercise regimen and improved his diet. Has lost 15 pounds over the past few months.   patient has never taken anything for acid reflux before.  Patient denies epigastric pain, nausea, vomiting.     Past Medical History  Diagnosis Date  . Asthma     history  . Arthritis   . History of chicken pox   . Adjustment disorder with mixed anxiety and depressed mood   . Diabetes mellitus     type 2  . Elevated blood pressure     history of high blood pressure readings  . Hypercholesteremia     Current Outpatient Prescriptions on File Prior to Visit  Medication Sig Dispense Refill  . ALPRAZolam (XANAX) 1 MG tablet TAKE 2 TABLETS BY MOUTH AT BEDTIME AS NEEDED FOR SLEEP  60 tablet   0  . aspirin 81 MG tablet Take 81 mg by mouth daily.      . Blood Glucose Monitoring Suppl (ONE TOUCH ULTRA SYSTEM KIT) W/DEVICE KIT 1 kit by Does not apply route once. Use to check blood sugar four times daily       . buPROPion (WELLBUTRIN XL) 150 MG 24 hr tablet TAKE 1 TABLET BY MOUTH ONCE DAILY  30 tablet  4  . fenofibrate 160 MG tablet TAKE 1 TABLET BY MOUTH ONCE DAILY  30 tablet  2  . glucose blood (ACCU-CHEK COMPACT STRIPS) test strip Use as instructed to check blood sugar eight times a day.  204 each  1  . insulin aspart (NOVOLOG FLEXPEN) 100 UNIT/ML injection Inject 25 Units into the skin 3 (three) times daily before meals.  1 pen  6  . insulin glargine (LANTUS) 100 UNIT/ML injection Inject 85 Units into the skin at bedtime.  30 mL  6  . Insulin Pen Needle (RELION PEN NEEDLE 31G/8MM) 31G X 8 MM MISC Use as Directed daily to check blood sugars  100 each  3  . Insulin Syringe-Needle U-100 30G 1 ML MISC USE AS DIRECTED TO INJECT INSULIN DX: 250.00  100 each  3  . ONE TOUCH LANCETS MISC Use as directed  200 each  11  . ramipril (ALTACE) 10 MG  capsule TAKE 2 CAPSULES BY MOUTH DAILY  60 capsule  3  . sildenafil (VIAGRA) 100 MG tablet Take 100 mg by mouth daily as needed.      . simvastatin (ZOCOR) 40 MG tablet TAKE 1 TABLET BY MOUTH AT BEDTIME  30 tablet  2  . tadalafil (CIALIS) 5 MG tablet One tablet by mouth once daily  30 tablet  2  . Testosterone (ANDROGEL) 20.25 MG/1.25GM (1.62%) GEL Place 4 Squirts onto the skin daily.  2.5 g  2   No current facility-administered medications on file prior to visit.    No Known Allergies  Family History  Problem Relation Age of Onset  . Diabetes Mother   . Thyroid disease Mother     Questionable  . Physical abuse Daughter     physical abuse as a child  . Heart disease Father     deceased    History   Social History  . Marital Status: Married    Spouse Name: N/A    Number of Children: N/A  . Years of Education: N/A   Social History  Main Topics  . Smoking status: Never Smoker   . Smokeless tobacco: Never Used  . Alcohol Use: None  . Drug Use: None  . Sexual Activity: None   Other Topics Concern  . None   Social History Narrative   Occupation: Scientist, research (physical sciences)   Married -15 marriage (2nd marriage)   daughter 22,  2 sons (2nd marriage  11,6)   [place]   Never Smoked    Alcohol use-yes   Drug use-no    Regular exercise-no   Smoking Status:  never   Does Patient Exercise:  no   Caffeine use/day:  3-4 cups coffee daily   Drug Use:  no         ROS  see history of present illness. All other review of systems are negative.    Filed Vitals:   12/09/12 1131  BP: 114/72  Pulse: 85  Temp: 97.9 F (36.6 C)  Resp: 16   Physical Exam  Vitals reviewed. Constitutional: He is oriented to person, place, and time and well-developed, well-nourished, and in no distress.  HENT:  Head: Normocephalic and atraumatic.  Eyes: Conjunctivae are normal.  Neck: Neck supple.  Abdominal: Soft. Bowel sounds are normal. There is no tenderness.  Neurological: He is alert and oriented to person, place, and time.  Skin: Skin is warm and dry. No rash noted.  Psychiatric: Affect normal.   Recent Results (from the past 2160 hour(s))  HEMOGLOBIN A1C     Status: Abnormal   Collection Time    10/27/12  4:15 PM      Result Value Range   Hemoglobin A1C 7.7 (*) <5.7 %   Comment:                                                                            According to the ADA Clinical Practice Recommendations for 2011, when     HbA1c is used as a screening test:             >=6.5%   Diagnostic of Diabetes Mellitus                (  if abnormal result is confirmed)           5.7-6.4%   Increased risk of developing Diabetes Mellitus           References:Diagnosis and Classification of Diabetes Mellitus,Diabetes     Care,2011,34(Suppl 1):S62-S69 and Standards of Medical Care in             Diabetes - 2011,Diabetes Care,2011,34 (Suppl  1):S11-S61.         Mean Plasma Glucose 174 (*) <117 mg/dL  BASIC METABOLIC PANEL     Status: Abnormal   Collection Time    10/27/12  4:15 PM      Result Value Range   Sodium 138  135 - 145 mEq/L   Potassium 4.5  3.5 - 5.3 mEq/L   Chloride 105  96 - 112 mEq/L   CO2 27  19 - 32 mEq/L   Glucose, Bld 138 (*) 70 - 99 mg/dL   BUN 20  6 - 23 mg/dL   Creat 9.52  8.41 - 3.24 mg/dL   Calcium 9.4  8.4 - 40.1 mg/dL  LIPID PANEL     Status: Abnormal   Collection Time    10/27/12  4:15 PM      Result Value Range   Cholesterol 129  0 - 200 mg/dL   Comment: ATP III Classification:           < 200        mg/dL        Desirable          200 - 239     mg/dL        Borderline High          >= 240        mg/dL        High         Triglycerides 99  <150 mg/dL   HDL 33 (*) >02 mg/dL   Total CHOL/HDL Ratio 3.9     VLDL 20  0 - 40 mg/dL   LDL Cholesterol 76  0 - 99 mg/dL   Comment:       Total Cholesterol/HDL Ratio:CHD Risk                            Coronary Heart Disease Risk Table                                            Men       Women              1/2 Average Risk              3.4        3.3                  Average Risk              5.0        4.4               2X Average Risk              9.6        7.1               3X Average Risk  23.4       11.0     Use the calculated Patient Ratio above and the CHD Risk table      to determine the patient's CHD Risk.     ATP III Classification (LDL):           < 100        mg/dL         Optimal          100 - 129     mg/dL         Near or Above Optimal          130 - 159     mg/dL         Borderline High          160 - 189     mg/dL         High           > 190        mg/dL         Very High        HEPATIC FUNCTION PANEL     Status: None   Collection Time    10/27/12  4:15 PM      Result Value Range   Total Bilirubin 0.4  0.3 - 1.2 mg/dL   Bilirubin, Direct 0.1  0.0 - 0.3 mg/dL   Indirect Bilirubin 0.3  0.0 - 0.9 mg/dL    Alkaline Phosphatase 48  39 - 117 U/L   AST 25  0 - 37 U/L   ALT 28  0 - 53 U/L   Total Protein 6.9  6.0 - 8.3 g/dL   Albumin 4.4  3.5 - 5.2 g/dL    Assessment/Plan: ADJUSTMENT DISORDER WITH ANXIOUS MOOD Instructed patient is okay to take the Wellbutrin at bedtime.  I am not comfortable doubling his Zoloft given that the patient is also on Wellbutrin.  Risks of serotonin syndrome with medication combo discussed with patient.  Discussed possibility of psychiatry referral for further medication management with patient. Patient declines.  Will increase Zoloft to 75 mg a day.  Followup in 3-4 weeks.  Acid reflux Rx omeprazole 20 mg daily. Will attempt a trial of PPI for 4 weeks. Follow up in one month.

## 2012-12-09 NOTE — Assessment & Plan Note (Signed)
Instructed patient is okay to take the Wellbutrin at bedtime.  I am not comfortable doubling his Zoloft given that the patient is also on Wellbutrin.  Risks of serotonin syndrome with medication combo discussed with patient.  Discussed possibility of psychiatry referral for further medication management with patient. Patient declines.  Will increase Zoloft to 75 mg a day.  Followup in 3-4 weeks.

## 2012-12-29 ENCOUNTER — Ambulatory Visit (INDEPENDENT_AMBULATORY_CARE_PROVIDER_SITE_OTHER): Payer: BC Managed Care – PPO | Admitting: Family Medicine

## 2012-12-29 ENCOUNTER — Telehealth: Payer: Self-pay | Admitting: Physician Assistant

## 2012-12-29 ENCOUNTER — Encounter: Payer: Self-pay | Admitting: Family Medicine

## 2012-12-29 VITALS — BP 110/70 | HR 99 | Temp 97.1°F | Wt 200.0 lb

## 2012-12-29 DIAGNOSIS — K219 Gastro-esophageal reflux disease without esophagitis: Secondary | ICD-10-CM

## 2012-12-29 DIAGNOSIS — J029 Acute pharyngitis, unspecified: Secondary | ICD-10-CM

## 2012-12-29 LAB — POCT RAPID STREP A (OFFICE): Rapid Strep A Screen: NEGATIVE

## 2012-12-29 MED ORDER — GUAIFENESIN-CODEINE 100-10 MG/5ML PO SYRP: ORAL_SOLUTION | ORAL | Status: DC

## 2012-12-29 MED ORDER — GI COCKTAIL ~~LOC~~
30.0000 mL | Freq: Once | ORAL | Status: AC
  Administered 2012-12-29: 30 mL via ORAL

## 2012-12-29 NOTE — Progress Notes (Signed)
Pre visit review using our clinic review tool, if applicable. No additional management support is needed unless otherwise documented below in the visit note. 

## 2012-12-29 NOTE — Telephone Encounter (Signed)
Patient called in stating that he has a sore throat, cough, and coughing of phlegm with blood tinge. Patient wanted appointment today. I explained to patient that we do not have any available appointment slots here in [facility] today and one of the doctors at our [facility] location could see him today. Patient declined. I offered patient to be seen tomorrow by Malva Cogan, patient declined.   Patient called back stating that he wants "a cough syrup with the additive codeine" called in to [employer]. Patient would like a callback at [phone]

## 2012-12-29 NOTE — Telephone Encounter (Signed)
Patient called back and scheduled appointment with Dr. Laury Axon for this afternoon.

## 2012-12-29 NOTE — Patient Instructions (Signed)

## 2012-12-31 ENCOUNTER — Encounter: Payer: Self-pay | Admitting: Family Medicine

## 2012-12-31 NOTE — Progress Notes (Signed)
   Subjective:    Patient ID: William [name], male    DOB: 12-Nov-1960, 52 y.o.   MRN: [id number]  HPI Pt woke up nauseous this am and stuck a spoon down his throat to try to vomit--- he just dry heaved.  Pt has had sore throat since.  He admits to missing PPI today.   No other complaints.    Review of Systemsas above    Objective:   Physical Exam  BP 110/70  Pulse 99  Temp(Src) 97.1 F (36.2 C) (Tympanic)  Wt 200 lb (90.719 kg)  SpO2 96% General appearance: alert, cooperative, appears stated age and no distress Nose: Nares normal. Septum midline. Mucosa normal. No drainage or sinus tenderness. Throat: abnormal findings: moderate oropharyngeal erythema Neck: no adenopathy, no carotid bruit, no JVD, supple, symmetrical, trachea midline and thyroid not enlarged, symmetric, no tenderness/mass/nodules Lungs: clear to auscultation bilaterally Abdomen: soft, non-tender; bowel sounds normal; no masses,  no organomegaly       Assessment & Plan:

## 2012-12-31 NOTE — Assessment & Plan Note (Addendum)
Pt missed dose of med today GI cocktail helped symptoms Probably cause of nausea and sore throat Strep neg rto prn

## 2013-01-04 ENCOUNTER — Other Ambulatory Visit: Payer: Self-pay | Admitting: Physician Assistant

## 2013-01-04 ENCOUNTER — Other Ambulatory Visit: Payer: Self-pay | Admitting: *Deleted

## 2013-01-04 DIAGNOSIS — G4700 Insomnia, unspecified: Secondary | ICD-10-CM

## 2013-01-04 MED ORDER — ALPRAZOLAM 1 MG PO TABS: ORAL_TABLET | ORAL | Status: DC

## 2013-01-04 NOTE — Telephone Encounter (Signed)
Received call from [place] at [employer]. She was calling to check on status of alprazolam request. Gave verbal and advised her to disregard message that may have been left on their voicemail earlier re: authorization.

## 2013-01-04 NOTE — Telephone Encounter (Signed)
Refill given Quantity 60.

## 2013-01-04 NOTE — Telephone Encounter (Signed)
eScribe request for refill on Alprazolam Last filled - 10.31.14, #60x0 Last AEX - 11.05.14 [Acute 11.25.14 Dr. Lowne] Next AEX - 12.2014 Please Advise/SLS

## 2013-01-04 NOTE — Telephone Encounter (Signed)
Rx request to pharmacy/SLS  

## 2013-01-04 NOTE — Telephone Encounter (Signed)
Requested Prescriptions   Pending Prescriptions Disp Refills  . ALPRAZolam (XANAX) 1 MG tablet 60 tablet 0    Sig: TAKE 2 TABLETS BY MOUTH AT BEDTIME AS NEEDED FOR SLEEP  Rx request phoned to pharmacy/SLS

## 2013-01-15 ENCOUNTER — Ambulatory Visit: Payer: BC Managed Care – PPO | Admitting: Physician Assistant

## 2013-01-27 ENCOUNTER — Ambulatory Visit (INDEPENDENT_AMBULATORY_CARE_PROVIDER_SITE_OTHER): Payer: BC Managed Care – PPO | Admitting: Physician Assistant

## 2013-01-27 ENCOUNTER — Encounter: Payer: Self-pay | Admitting: Physician Assistant

## 2013-01-27 VITALS — BP 118/74 | HR 83 | Temp 98.0°F | Resp 16 | Ht 72.0 in | Wt 202.0 lb

## 2013-01-27 DIAGNOSIS — F4322 Adjustment disorder with anxiety: Secondary | ICD-10-CM

## 2013-01-27 DIAGNOSIS — E291 Testicular hypofunction: Secondary | ICD-10-CM

## 2013-01-27 DIAGNOSIS — N529 Male erectile dysfunction, unspecified: Secondary | ICD-10-CM

## 2013-01-27 DIAGNOSIS — G4700 Insomnia, unspecified: Secondary | ICD-10-CM

## 2013-01-27 DIAGNOSIS — E119 Type 2 diabetes mellitus without complications: Secondary | ICD-10-CM

## 2013-01-27 LAB — COMPREHENSIVE METABOLIC PANEL
AST: 20 U/L (ref 0–37)
Alkaline Phosphatase: 61 U/L (ref 39–117)
BUN: 15 mg/dL (ref 6–23)
Creat: 0.92 mg/dL (ref 0.50–1.35)
Glucose, Bld: 170 mg/dL — ABNORMAL HIGH (ref 70–99)
Sodium: 137 mEq/L (ref 135–145)

## 2013-01-27 LAB — HEMOGLOBIN A1C
Hgb A1c MFr Bld: 7.4 % — ABNORMAL HIGH (ref <5.7)
Mean Plasma Glucose: 166 mg/dL — ABNORMAL HIGH (ref <117)

## 2013-01-27 MED ORDER — SILDENAFIL CITRATE 100 MG PO TABS: 50.0000 mg | ORAL_TABLET | Freq: Every day | ORAL | Status: DC | PRN

## 2013-01-27 MED ORDER — ALPRAZOLAM 1 MG PO TABS: ORAL_TABLET | ORAL | Status: DC

## 2013-01-27 NOTE — Patient Instructions (Signed)
Please monitor blood sugars.  Return to clinic in 2 weeks.  I will call you with your lab results.  For Viagra -- take 1/2 tablet one hour prior to desired intercourse.  Do not take more than 1 dose in a 24 hour period.

## 2013-01-27 NOTE — Progress Notes (Signed)
Pre visit review using our clinic review tool, if applicable. No additional management support is needed unless otherwise documented below in the visit note/SLS  

## 2013-01-28 LAB — URINALYSIS, ROUTINE W REFLEX MICROSCOPIC
Glucose, UA: NEGATIVE mg/dL
Hgb urine dipstick: NEGATIVE
Ketones, ur: NEGATIVE mg/dL
Leukocytes, UA: NEGATIVE
Nitrite: NEGATIVE
Protein, ur: NEGATIVE mg/dL
Specific Gravity, Urine: 1.012 (ref 1.005–1.030)

## 2013-01-31 DIAGNOSIS — N529 Male erectile dysfunction, unspecified: Secondary | ICD-10-CM | POA: Insufficient documentation

## 2013-01-31 NOTE — Assessment & Plan Note (Signed)
Patient given Rx for Xanax 15 tablets to help with increased use of medication during this hard time. No more additional tablets will be given.

## 2013-01-31 NOTE — Assessment & Plan Note (Signed)
Continue medication as prescribed. We'll recheck testosterone levels.

## 2013-01-31 NOTE — Assessment & Plan Note (Signed)
Trial of Viagra. Patient instructed on risks associated with medication usage. Instructed to take no more than one tablet per 24-hour period.
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
## 2013-01-31 NOTE — Assessment & Plan Note (Signed)
Discussed with patient and completely stopping insulin without consulting with [place] first is AGAINST MEDICAL ADVICE. We'll attempt trial of metformin 1000 mg twice a day. We'll recheck A1c today as it is due. Patient instructed to check blood sugar once in the morning before breakfast and once later in the day. Patient to bring blood sugar log to clinic for two-week followup.

## 2013-02-01 ENCOUNTER — Other Ambulatory Visit: Payer: Self-pay | Admitting: Physician Assistant

## 2013-02-01 ENCOUNTER — Other Ambulatory Visit: Payer: Self-pay | Admitting: Family

## 2013-02-01 ENCOUNTER — Telehealth: Payer: Self-pay | Admitting: Physician Assistant

## 2013-02-01 MED ORDER — METFORMIN HCL ER 500 MG PO TB24: 1000.0000 mg | ORAL_TABLET | ORAL | Status: DC

## 2013-02-01 NOTE — Telephone Encounter (Signed)
Metformin hcl er 500 mg tab take 2 tablets by mouth as directed  Patient states he no longer takes insulin and takes two metformin bid

## 2013-02-01 NOTE — Telephone Encounter (Signed)
Denied-Too Soon-Last Rx 10.17.14 [to be filled on or after 10.20.14] 2.5g w/2 refills/SLS Testosterone (ANDROGEL) 20.25 MG/1.25GM (1.62%) GEL 2.5 g 2 11/20/2012 Sig - Route: Place 4 Squirts onto the skin daily. - Transdermal Class: Phone In Notes to Pharmacy: FILL ON OR AFTER November 23, 2012

## 2013-02-01 NOTE — Telephone Encounter (Signed)
Rx request to pharmacy/SLS  

## 2013-02-01 NOTE — Telephone Encounter (Signed)
Alprazolam 1mg  tablet take 2 tablets as needed at bedtime for sleep Patient would like qty of 45 to finish out month or the qty can be for 60

## 2013-02-01 NOTE — Telephone Encounter (Signed)
Per provider, phone pharmacy [spoke with Meredith] and Denied Al[prazolam & also Viagra request for Rx refills: Too Soon for request. Pt was given Viagra at 12.24.14 OV & also was given Alprazolam #15, in addition to the #60 given on 12.01.14 to supplement until follow-up OV [suppose to be scheduled 12.24.14] for 2 wks/SLS Metformin request sent to pharmacy/SLS

## 2013-02-01 NOTE — Telephone Encounter (Signed)
Please advise 

## 2013-02-02 ENCOUNTER — Telehealth: Payer: Self-pay | Admitting: Physician Assistant

## 2013-02-02 DIAGNOSIS — G4700 Insomnia, unspecified: Secondary | ICD-10-CM

## 2013-02-02 MED ORDER — ALPRAZOLAM 1 MG PO TABS: ORAL_TABLET | ORAL | Status: DC

## 2013-02-02 NOTE — Telephone Encounter (Signed)
Patient called in stating that he needs to talk to a nurse about his prescriptions. He is requesting a callback as soon as possible. He states that he needs to talk about the viagra that he did not request and he needs to explain how he portions out his xanax prescription

## 2013-02-02 NOTE — Telephone Encounter (Signed)
Spoke with pt. He reiterated that he did not request a refill on Viagra. Also states, that he did not intend to pick up alprazolam rx until Friday and that he is not trying to abuse any medication. Acknowledged pt and explained that we are very careful when prescribing controlled substances and generally do not send refills in early. Explained that I would go ahead and send refill to the pharmacy with notation that Rx cannot be filled until 02/04/13 when monthly Rx is due.  Pt voices understanding, rx called to [facility].

## 2013-02-03 ENCOUNTER — Telehealth: Payer: Self-pay | Admitting: Physician Assistant

## 2013-02-03 NOTE — Telephone Encounter (Signed)
Taken care of; was NOT sent to pharmacy incorrectly, please see Rx history in EMR/SLS

## 2013-02-03 NOTE — Telephone Encounter (Signed)
Metformin xr 2 500 mg 2 times a day.  Please update this dosage  Was sent down as 2 1000mg  two times a day

## 2013-02-24 ENCOUNTER — Telehealth: Payer: Self-pay | Admitting: Physician Assistant

## 2013-02-24 DIAGNOSIS — E349 Endocrine disorder, unspecified: Secondary | ICD-10-CM

## 2013-02-24 NOTE — Telephone Encounter (Signed)
Refill- androgel 1.62% gel pump. Apply 4 pumps onto the skin daily. Qty 150 last fill 12.22.14

## 2013-02-25 ENCOUNTER — Other Ambulatory Visit: Payer: Self-pay | Admitting: Physician Assistant

## 2013-02-25 MED ORDER — TESTOSTERONE 20.25 MG/1.25GM (1.62%) TD GEL: 4.0000 | Freq: Every day | TRANSDERMAL | Status: DC

## 2013-02-25 NOTE — Telephone Encounter (Signed)
Opened in error

## 2013-02-25 NOTE — Telephone Encounter (Signed)
Patient has not had follow-up concerning his erectile dysfunction.  The initial prescription was for 3 tablets that were free due to a voucher.  If the patient wants a refill, that is fine.  He will have to incur the cost of the medication.  He also needs to schedule a follow-up appointment within the next month.

## 2013-02-25 NOTE — Telephone Encounter (Signed)
Med faxed   

## 2013-02-25 NOTE — Telephone Encounter (Signed)
Please advise refill? RX wrote on 01-27-13 quantity 3 with 0 refills

## 2013-02-25 NOTE — Telephone Encounter (Signed)
Rx refill granted, signed and ready to be faxed to [facility].

## 2013-03-05 ENCOUNTER — Other Ambulatory Visit: Payer: Self-pay | Admitting: Physician Assistant

## 2013-03-05 NOTE — Telephone Encounter (Signed)
Rx request to pharmacy/SLS  

## 2013-03-08 ENCOUNTER — Telehealth: Payer: Self-pay | Admitting: Physician Assistant

## 2013-03-08 DIAGNOSIS — G4700 Insomnia, unspecified: Secondary | ICD-10-CM

## 2013-03-08 MED ORDER — ALPRAZOLAM 1 MG PO TABS: ORAL_TABLET | ORAL | Status: DC

## 2013-03-08 NOTE — Telephone Encounter (Signed)
eScribe request from [facility] for refill on Alprazolam Last filled - 12.30.14, #60x0 Last AEX - 12.24.14 Next AEX - 2 Weeks, No future appointment scheduled Per Provider VO, Rx phoned to pharmacy with notation: Patient Needs Appointment Prior to Future Refills/SLS

## 2013-03-08 NOTE — Telephone Encounter (Signed)
Refill- alprazolam 1mg  tablet. Take two tablets by mouth at bedtime as needed for sleep. Qty 60 last fill 1.2.15

## 2013-03-24 ENCOUNTER — Other Ambulatory Visit: Payer: Self-pay | Admitting: *Deleted

## 2013-03-24 DIAGNOSIS — E349 Endocrine disorder, unspecified: Secondary | ICD-10-CM

## 2013-04-02 ENCOUNTER — Telehealth: Payer: Self-pay | Admitting: Physician Assistant

## 2013-04-02 ENCOUNTER — Other Ambulatory Visit: Payer: Self-pay | Admitting: Physician Assistant

## 2013-04-02 DIAGNOSIS — G4700 Insomnia, unspecified: Secondary | ICD-10-CM

## 2013-04-02 MED ORDER — METFORMIN HCL 1000 MG PO TABS: 1000.0000 mg | ORAL_TABLET | Freq: Two times a day (BID) | ORAL | Status: DC

## 2013-04-02 NOTE — Telephone Encounter (Signed)
ALPRAZolam (XANAX) 1 MG tablet 60 tablet 0 03/08/2013     Sig: TAKE 2 TABLETS BY MOUTH AT BEDTIME AS NEEDED FOR SLEEP    Class: Phone In    Notes to Pharmacy: **Patient Needs Appointment Prior to Future Refills**    Associated Diagnoses    Insomnia - Primary    Pharmacy    [place] [place] [facility] [place] with contact name & number to inform patient that we will give him refill on Alprazolam when Rx is due on April 07, 2013/SLS Will hold message until then on desktop.

## 2013-04-02 NOTE — Telephone Encounter (Signed)
Rx request to pharmacy/SLS  

## 2013-04-02 NOTE — Telephone Encounter (Signed)
Patient has appointment on 04/14/13 with [facility]. He would like to know if Einar Pheasant would refill alprazolam one more time? [facility]

## 2013-04-03 NOTE — Telephone Encounter (Signed)
Patient sent message via MyChart for refill of his Alprazolam.  Patient has appointment scheduled on 3/11 for follow-up and complete physical for [employer].  I will refill his medication.  No additional refills will be given until the visit on 3/11 has been completed. Refill needs to be faxed in.  Medication pending for fax.

## 2013-04-05 NOTE — Telephone Encounter (Signed)
LMOM with contact name and number RE: requested Rx not being due for refill until Wed, 03.04.15 [as a My Chart email was sent to provider] and that Rx would be sent to pharmacy at that time/SLS

## 2013-04-05 NOTE — Telephone Encounter (Signed)
LMOM with contact name and number RE: Alprazolam not due for fill on 30-day cycle until Wed, 03.04.15/SLS

## 2013-04-07 MED ORDER — ALPRAZOLAM 1 MG PO TABS: ORAL_TABLET | ORAL | Status: DC

## 2013-04-07 NOTE — Telephone Encounter (Signed)
Rx request to pharmacy/SLS  

## 2013-04-07 NOTE — Telephone Encounter (Signed)
Rx request to pharmacy; see original 02.27.15 telephone note/SLS

## 2013-04-14 ENCOUNTER — Ambulatory Visit (INDEPENDENT_AMBULATORY_CARE_PROVIDER_SITE_OTHER): Payer: BC Managed Care – PPO | Admitting: Physician Assistant

## 2013-04-14 ENCOUNTER — Encounter: Payer: Self-pay | Admitting: Physician Assistant

## 2013-04-14 VITALS — BP 112/72 | HR 72 | Temp 97.8°F | Resp 16 | Ht 72.0 in | Wt 200.8 lb

## 2013-04-14 DIAGNOSIS — Z0000 Encounter for general adult medical examination without abnormal findings: Secondary | ICD-10-CM

## 2013-04-14 DIAGNOSIS — Z299 Encounter for prophylactic measures, unspecified: Secondary | ICD-10-CM

## 2013-04-14 DIAGNOSIS — Z111 Encounter for screening for respiratory tuberculosis: Secondary | ICD-10-CM

## 2013-04-14 NOTE — Progress Notes (Signed)
Pre visit review using our clinic review tool, if applicable. No additional management support is needed unless otherwise documented below in the visit note/SLS  

## 2013-04-14 NOTE — Patient Instructions (Signed)
Please obtain labs.  I will call you with your results.  I will complete forms once results are in.  We will call you as soon as they are ready for pickup.  Please remember to come in Friday after 11:30 for TB skin test reading.  Cannot be read after Friday as we are closed on Saturdays.

## 2013-04-14 NOTE — Progress Notes (Signed)
Patient presents to clinic today for examination for acceptance into [employer].  Patient doing well overall.  Is due for A1C recheck at the end of the month.  Patient taking medications as prescribed.  No new complains.  No chest pain, palpitations, SOB, LH, dizziness.  Denies Hx of seizures.  Denies syncope.  Continues to take his Xanax, Zoloft and Wellbutrin as prescribed.  Denies suicidal thought or ideation.  Has been stable on medications for a long time.  Past Medical History  Diagnosis Date  . Asthma     history  . Arthritis   . History of chicken pox   . Adjustment disorder with mixed anxiety and depressed mood   . Diabetes mellitus     type 2  . Elevated blood pressure     history of high blood pressure readings  . Hypercholesteremia     Current Outpatient Prescriptions on File Prior to Visit  Medication Sig Dispense Refill  . ALPRAZolam (XANAX) 1 MG tablet TAKE 2 TABLETS BY MOUTH AT BEDTIME AS NEEDED FOR SLEEP  60 tablet  0  . aspirin 81 MG tablet Take 81 mg by mouth daily.      . Blood Glucose Monitoring Suppl (ONE TOUCH ULTRA SYSTEM KIT) W/DEVICE KIT 1 kit by Does not apply route once. Use to check blood sugar four times daily       . buPROPion (WELLBUTRIN XL) 150 MG 24 hr tablet TAKE 1 TABLET BY MOUTH ONCE DAILY  30 tablet  4  . fenofibrate 160 MG tablet TAKE 1 TABLET BY MOUTH ONCE DAILY  30 tablet  2  . glucose blood (ACCU-CHEK COMPACT STRIPS) test strip Use as instructed to check blood sugar eight times a day.  204 each  1  . insulin aspart (NOVOLOG FLEXPEN) 100 UNIT/ML injection Inject 25 Units into the skin 3 (three) times daily before meals.  1 pen  6  . insulin glargine (LANTUS) 100 UNIT/ML injection Inject 85 Units into the skin at bedtime.  30 mL  6  . Insulin Pen Needle (RELION PEN NEEDLE 31G/8MM) 31G X 8 MM MISC Use as Directed daily to check blood sugars  100 each  3  . Insulin Syringe-Needle U-100 30G 1 ML MISC USE AS DIRECTED TO INJECT INSULIN DX:  250.00  100 each  3  . metFORMIN (GLUCOPHAGE) 1000 MG tablet Take 1 tablet (1,000 mg total) by mouth 2 (two) times daily with a meal.  60 tablet  2  . omeprazole (PRILOSEC) 20 MG capsule TAKE 1 CAPSULE (20 MG TOTAL) BY MOUTH DAILY.  30 capsule  2  . ONE TOUCH LANCETS MISC Use as directed  200 each  11  . ramipril (ALTACE) 10 MG capsule TAKE 2 CAPSULES BY MOUTH DAILY  60 capsule  3  . sertraline (ZOLOFT) 50 MG tablet Take 1.5 tablets (75 mg total) by mouth daily.  45 tablet  4  . sildenafil (VIAGRA) 100 MG tablet Take 0.5-1 tablets (50-100 mg total) by mouth daily as needed for erectile dysfunction.  3 tablet  0  . simvastatin (ZOCOR) 40 MG tablet TAKE 1 TABLET BY MOUTH AT BEDTIME  30 tablet  2  . Testosterone (ANDROGEL) 20.25 MG/1.25GM (1.62%) GEL Place 4 Squirts onto the skin daily.  2.5 g  1   No current facility-administered medications on file prior to visit.    No Known Allergies  Family History  Problem Relation Age of Onset  . Diabetes Mother   . Thyroid  disease Mother     Questionable  . Heart disease Father     deceased    History   Social History  . Marital Status: Married    Spouse Name: N/A    Number of Children: N/A  . Years of Education: N/A   Social History Main Topics  . Smoking status: Never Smoker   . Smokeless tobacco: Never Used  . Alcohol Use: None  . Drug Use: None  . Sexual Activity: None   Other Topics Concern  . None   Social History Narrative   Occupation: Engineer, site   Married -44 marriage (2nd marriage)   daughter 32,  2 sons (2nd marriage  11,6)   [place]   Never Smoked    Alcohol use-yes   Drug use-no    Regular exercise-no   Smoking Status:  never   Does Patient Exercise:  no   Caffeine use/day:  3-4 cups coffee daily   Drug Use:  no         Review of Systems - See HPI.  All other ROS are negative.  BP 112/72  Pulse 72  Temp(Src) 97.8 F (36.6 C) (Oral)  Resp 16  Ht 6' (1.829 m)  Wt 200 lb 12 oz (91.06 kg)  BMI 27.22  kg/m2  SpO2 97%  Physical Exam  Vitals reviewed. Constitutional: He is oriented to person, place, and time and well-developed, well-nourished, and in no distress.  HENT:  Head: Normocephalic and atraumatic.  Right Ear: External ear normal.  Left Ear: External ear normal.  Nose: Nose normal.  Mouth/Throat: Oropharynx is clear and moist. No oropharyngeal exudate.  TM within normal limits bilaterally.  Eyes: Conjunctivae are normal. Pupils are equal, round, and reactive to light.  Neck: Neck supple.  Cardiovascular: Normal rate, regular rhythm, normal heart sounds and intact distal pulses.   Pulmonary/Chest: Effort normal and breath sounds normal. No respiratory distress. He has no wheezes. He has no rales. He exhibits no tenderness.  Abdominal: Soft. Bowel sounds are normal. He exhibits no distension and no mass. There is no tenderness. There is no rebound and no guarding.  Lymphadenopathy:    He has no cervical adenopathy.  Neurological: He is alert and oriented to person, place, and time.  Skin: Skin is warm and dry. No rash noted.  Psychiatric: Affect normal.     Recent Results (from the past 2160 hour(s))  TESTOSTERONE     Status: None   Collection Time    01/27/13  9:31 AM      Result Value Ref Range   Testosterone 489  300 - 890 ng/dL   Comment:           Tanner Stage       Male              Male                   I              < 30 ng/dL        < 10 ng/dL                   II             < 150 ng/dL       < 30 ng/dL                   III  100-320 ng/dL     < 35 ng/dL                   IV             200-970 ng/dL     15-40 ng/dL                   V/Adult        300-890 ng/dL     10-70 ng/dL        COMPREHENSIVE METABOLIC PANEL     Status: Abnormal   Collection Time    01/27/13  9:31 AM      Result Value Ref Range   Sodium 137  135 - 145 mEq/L   Potassium 4.2  3.5 - 5.3 mEq/L   Chloride 100  96 - 112 mEq/L   CO2 24  19 - 32 mEq/L   Glucose, Bld 170 (*)  70 - 99 mg/dL   BUN 15  6 - 23 mg/dL   Creat 0.92  0.50 - 1.35 mg/dL   Total Bilirubin 0.3  0.3 - 1.2 mg/dL   Alkaline Phosphatase 61  39 - 117 U/L   AST 20  0 - 37 U/L   ALT 25  0 - 53 U/L   Total Protein 6.6  6.0 - 8.3 g/dL   Albumin 4.3  3.5 - 5.2 g/dL   Calcium 8.8  8.4 - 10.5 mg/dL  HEMOGLOBIN A1C     Status: Abnormal   Collection Time    01/27/13  9:31 AM      Result Value Ref Range   Hemoglobin A1C 7.4 (*) <5.7 %   Comment:                                                                            According to the ADA Clinical Practice Recommendations for 2011, when     HbA1c is used as a screening test:             >=6.5%   Diagnostic of Diabetes Mellitus                (if abnormal result is confirmed)           5.7-6.4%   Increased risk of developing Diabetes Mellitus           References:Diagnosis and Classification of Diabetes Mellitus,Diabetes     TZGY,1749,44(HQPRF 1):S62-S69 and Standards of Medical Care in             Diabetes - 2011,Diabetes Care,2011,34 (Suppl 1):S11-S61.         Mean Plasma Glucose 166 (*) <117 mg/dL  URINALYSIS, ROUTINE W REFLEX MICROSCOPIC     Status: None   Collection Time    01/27/13  9:43 AM      Result Value Ref Range   Color, Urine YELLOW  YELLOW   APPearance CLEAR  CLEAR   Specific Gravity, Urine 1.012  1.005 - 1.030   pH 6.0  5.0 - 8.0   Glucose, UA NEG  NEG mg/dL   Bilirubin Urine NEG  NEG   Ketones, ur NEG  NEG mg/dL   Hgb urine dipstick NEG  NEG  Protein, ur NEG  NEG mg/dL   Urobilinogen, UA 0.2  0.0 - 1.0 mg/dL   Nitrite NEG  NEG   Leukocytes, UA NEG  NEG    Assessment/Plan: Physical exam Physical exam within normal limits.  Will obtain urinalysis, urine drug screen and Tb skin test placement per requirements of program.  Will fill out appropriate paperwork and will call patient when ready for pickup.    I have spent 25-30 minutes with patient including time for interview, physical exam and discussion.

## 2013-04-14 NOTE — Assessment & Plan Note (Signed)
Physical exam within normal limits.  Will obtain urinalysis, urine drug screen and Tb skin test placement per requirements of program.  Will fill out appropriate paperwork and will call patient when ready for pickup.

## 2013-04-15 LAB — DRUG SCREEN, URINE
AMPHETAMINE SCRN UR: NEGATIVE
Barbiturate Quant, Ur: NEGATIVE
Benzodiazepines.: POSITIVE — AB
COCAINE METABOLITES: NEGATIVE
Creatinine,U: 74 mg/dL
MARIJUANA METABOLITE: NEGATIVE
Methadone: NEGATIVE
OPIATES: NEGATIVE
PHENCYCLIDINE (PCP): NEGATIVE
PROPOXYPHENE: NEGATIVE

## 2013-04-15 LAB — URINALYSIS, ROUTINE W REFLEX MICROSCOPIC
Bilirubin Urine: NEGATIVE
GLUCOSE, UA: 100 mg/dL — AB
Hgb urine dipstick: NEGATIVE
KETONES UR: NEGATIVE mg/dL
Leukocytes, UA: NEGATIVE
Nitrite: NEGATIVE
Protein, ur: NEGATIVE mg/dL
Specific Gravity, Urine: 1.017 (ref 1.005–1.030)
UROBILINOGEN UA: 0.2 mg/dL (ref 0.0–1.0)
pH: 5.5 (ref 5.0–8.0)

## 2013-04-16 ENCOUNTER — Ambulatory Visit: Payer: BC Managed Care – PPO

## 2013-04-16 LAB — TB SKIN TEST
Induration: 48 mm
TB Skin Test: NEGATIVE

## 2013-04-20 ENCOUNTER — Other Ambulatory Visit: Payer: Self-pay | Admitting: Physician Assistant

## 2013-04-20 ENCOUNTER — Telehealth: Payer: Self-pay | Admitting: Physician Assistant

## 2013-04-20 NOTE — Telephone Encounter (Signed)
Refill androgel to [facility]

## 2013-04-21 NOTE — Telephone Encounter (Signed)
DUPLICATE-already have escribe request; Done/sls

## 2013-04-21 NOTE — Telephone Encounter (Signed)
Rx request faxed to pharmacy/SLS  

## 2013-05-05 ENCOUNTER — Other Ambulatory Visit: Payer: Self-pay | Admitting: Physician Assistant

## 2013-05-05 NOTE — Telephone Encounter (Signed)
Refill sent per [facility] refill protocol; provider verbal Ok/SLS

## 2013-05-06 ENCOUNTER — Telehealth: Payer: Self-pay | Admitting: Physician Assistant

## 2013-05-06 DIAGNOSIS — G4700 Insomnia, unspecified: Secondary | ICD-10-CM

## 2013-05-06 MED ORDER — ALPRAZOLAM 1 MG PO TABS: ORAL_TABLET | ORAL | Status: DC

## 2013-05-06 NOTE — Telephone Encounter (Signed)
eScribe request from [place] for refill on Alprazolam 1mg  Last filled - 03.02.15, #60x0 Last AEX - 03.11.15 Next AEX - Not Noted Refill sent per [facility] refill protocol; verbal Ok per provider/SLS

## 2013-05-06 NOTE — Telephone Encounter (Signed)
Refill alprazolam 

## 2013-05-26 ENCOUNTER — Telehealth: Payer: Self-pay | Admitting: Physician Assistant

## 2013-05-26 ENCOUNTER — Other Ambulatory Visit: Payer: Self-pay | Admitting: Physician Assistant

## 2013-05-26 ENCOUNTER — Telehealth: Payer: Self-pay | Admitting: *Deleted

## 2013-05-26 DIAGNOSIS — E119 Type 2 diabetes mellitus without complications: Secondary | ICD-10-CM

## 2013-05-26 DIAGNOSIS — E291 Testicular hypofunction: Secondary | ICD-10-CM

## 2013-05-26 DIAGNOSIS — Z299 Encounter for prophylactic measures, unspecified: Secondary | ICD-10-CM

## 2013-05-26 DIAGNOSIS — G4700 Insomnia, unspecified: Secondary | ICD-10-CM

## 2013-05-26 MED ORDER — ALPRAZOLAM 1 MG PO TABS: ORAL_TABLET | ORAL | Status: DC

## 2013-05-26 NOTE — Telephone Encounter (Signed)
Patient states that he is coming in tomorrow morning for lab work and wants to make sure that the labs are ordered. Liver panel, a1c, testosterone, and anything else that [place] feels like he needs

## 2013-05-26 NOTE — Telephone Encounter (Signed)
Please Advise on additional labs/SLS

## 2013-05-26 NOTE — Telephone Encounter (Signed)
Message Call patient Received: Today     Leeanne Rio, PA-C                                 Rockwell Germany, CMA    Patient needs to return to lab one morning for testosterone level recheck before refill will be given. (Lab order -- Tesosterone, Free and Total). Last testosterone check in 12/14 was starting to be in normal range. Once we hit the middle of the normal range, will need to reduce dosing of testosterone.       Patient informed, understood & agreed/SLS 04.22.15 9:12am Lab Order Placed.

## 2013-05-26 NOTE — Telephone Encounter (Signed)
Additional lab orders placed.  Will call him with his results once they are in.

## 2013-05-26 NOTE — Telephone Encounter (Signed)
Not due until 5/2.  Can print refill with same quantity but with note to pharmacy it cannot be filled until 06/05/13

## 2013-05-26 NOTE — Telephone Encounter (Signed)
MyChart request from patient for refill on Alprazolam Last filled - 04.02.15, #60x0 MyChart request from patient for Viagra Last Filled - 12.24.14, #3x0  Last AEX - 03.11.15 Please Advise on refills/SLS

## 2013-05-27 ENCOUNTER — Other Ambulatory Visit: Payer: Self-pay | Admitting: Physician Assistant

## 2013-05-27 LAB — CBC WITH DIFFERENTIAL/PLATELET
BASOS ABS: 0.1 10*3/uL (ref 0.0–0.1)
Basophils Relative: 1 % (ref 0–1)
EOS ABS: 0.3 10*3/uL (ref 0.0–0.7)
Eosinophils Relative: 5 % (ref 0–5)
HCT: 39.8 % (ref 39.0–52.0)
Hemoglobin: 13.4 g/dL (ref 13.0–17.0)
Lymphocytes Relative: 38 % (ref 12–46)
Lymphs Abs: 2.1 10*3/uL (ref 0.7–4.0)
MCH: 30 pg (ref 26.0–34.0)
MCHC: 33.7 g/dL (ref 30.0–36.0)
MCV: 89 fL (ref 78.0–100.0)
Monocytes Absolute: 0.4 10*3/uL (ref 0.1–1.0)
Monocytes Relative: 7 % (ref 3–12)
Neutro Abs: 2.7 10*3/uL (ref 1.7–7.7)
Neutrophils Relative %: 49 % (ref 43–77)
PLATELETS: 214 10*3/uL (ref 150–400)
RBC: 4.47 MIL/uL (ref 4.22–5.81)
RDW: 14 % (ref 11.5–15.5)
WBC: 5.6 10*3/uL (ref 4.0–10.5)

## 2013-05-27 LAB — HEMOGLOBIN A1C
Hgb A1c MFr Bld: 9.4 % — ABNORMAL HIGH (ref <5.7)
Mean Plasma Glucose: 223 mg/dL — ABNORMAL HIGH (ref <117)

## 2013-05-27 NOTE — Telephone Encounter (Signed)
Patient informed, understood & agreed; Rx [Alprazolam] faxed to pharmacy & Samples [Viagra] ready for p/u at front desk/SLS

## 2013-05-28 ENCOUNTER — Ambulatory Visit: Payer: BC Managed Care – PPO | Admitting: Physician Assistant

## 2013-05-28 ENCOUNTER — Telehealth: Payer: Self-pay | Admitting: Physician Assistant

## 2013-05-28 ENCOUNTER — Other Ambulatory Visit: Payer: Self-pay | Admitting: Physician Assistant

## 2013-05-28 LAB — COMPREHENSIVE METABOLIC PANEL
ALK PHOS: 63 U/L (ref 39–117)
ALT: 30 U/L (ref 0–53)
AST: 37 U/L (ref 0–37)
Albumin: 4 g/dL (ref 3.5–5.2)
BUN: 21 mg/dL (ref 6–23)
CO2: 25 meq/L (ref 19–32)
Calcium: 8.8 mg/dL (ref 8.4–10.5)
Chloride: 103 mEq/L (ref 96–112)
Creat: 0.97 mg/dL (ref 0.50–1.35)
Glucose, Bld: 111 mg/dL — ABNORMAL HIGH (ref 70–99)
Potassium: 4.5 mEq/L (ref 3.5–5.3)
SODIUM: 140 meq/L (ref 135–145)
TOTAL PROTEIN: 6.4 g/dL (ref 6.0–8.3)
Total Bilirubin: 0.4 mg/dL (ref 0.2–1.2)

## 2013-05-28 LAB — MICROALBUMIN / CREATININE URINE RATIO
CREATININE, URINE: 102.1 mg/dL
Microalb Creat Ratio: 4.9 mg/g (ref 0.0–30.0)
Microalb, Ur: 0.5 mg/dL (ref 0.00–1.89)

## 2013-05-28 MED ORDER — INSULIN GLARGINE 100 UNIT/ML ~~LOC~~ SOLN: 85.0000 [IU] | Freq: Every day | SUBCUTANEOUS | Status: DC

## 2013-05-28 MED ORDER — TESTOSTERONE 20.25 MG/ACT (1.62%) TD GEL: TRANSDERMAL | Status: DC

## 2013-05-28 NOTE — Telephone Encounter (Signed)
Testosterone level is low again.  Has he been using the testosterone as directed?  Also A1C has jumped back up from 7.4 to 9.4 since he has decided to attempt a trial off of his insulin.  We should restart insulin at his previous dose (this is my recommendation) or we can try to add on an additional oral medication (option #2).

## 2013-05-28 NOTE — Telephone Encounter (Signed)
Patient walked into lobby requesting results of labs; sat down and went over results & provider instructions, had to call [facility] and verify if we could re-run Testosterone d/t extreme change in levels with pt's verbal acknowledgement that he had been using Rx as directed. Also, CMET had no results in EMR, [facility] stated CMET order not drawn and/or received; placed add-on order to have this test done. Pt will restart using Lantus insulin and wants to see what change is made in 3 mths before adding additional oral diabetes medication. Pt informed we will call when we receive new lab order results next week & pt also has appointment scheduled for Fri, [phone]. At patient request, with pending lab results; phoned order into [facility] pharmacy for Lantus Insulin & Androgel Testosterone with Suezanne Jacquet; pt informed pharmacy filling orders. [Time spent with pt >45 minutes]/SLS

## 2013-06-01 ENCOUNTER — Telehealth: Payer: Self-pay

## 2013-06-01 LAB — TESTOSTERONE, FREE, TOTAL, SHBG
Sex Hormone Binding: 12 nmol/L — ABNORMAL LOW (ref 13–71)
TESTOSTERONE FREE: 47.7 pg/mL (ref 47.0–244.0)
TESTOSTERONE-% FREE: 3 % — AB (ref 1.6–2.9)
Testosterone: 161 ng/dL — ABNORMAL LOW (ref 300–890)

## 2013-06-01 NOTE — Telephone Encounter (Signed)
[name] w/[facility] called stating that on 4-22 pts total testosterone was 161 and this was repeated and the total testosterone was 175.  They are sending results and updating chart.

## 2013-06-03 ENCOUNTER — Other Ambulatory Visit: Payer: Self-pay | Admitting: Physician Assistant

## 2013-06-03 DIAGNOSIS — G4700 Insomnia, unspecified: Secondary | ICD-10-CM

## 2013-06-03 NOTE — Telephone Encounter (Signed)
Patient wants to have 'fill date' changed from Sat, 05.02.15 to 05.01.15 d/t pharmacy not open on Saturdays; informed that we would take care of at 05.01.15 OV, pt understood & agreed/SLS

## 2013-06-04 ENCOUNTER — Ambulatory Visit (INDEPENDENT_AMBULATORY_CARE_PROVIDER_SITE_OTHER): Payer: BC Managed Care – PPO | Admitting: Physician Assistant

## 2013-06-04 ENCOUNTER — Telehealth: Payer: Self-pay | Admitting: Physician Assistant

## 2013-06-04 ENCOUNTER — Encounter: Payer: Self-pay | Admitting: Physician Assistant

## 2013-06-04 VITALS — BP 114/72 | HR 85 | Temp 97.7°F | Resp 16 | Ht 72.0 in | Wt 208.5 lb

## 2013-06-04 DIAGNOSIS — E119 Type 2 diabetes mellitus without complications: Secondary | ICD-10-CM

## 2013-06-04 DIAGNOSIS — E349 Endocrine disorder, unspecified: Secondary | ICD-10-CM

## 2013-06-04 DIAGNOSIS — G4700 Insomnia, unspecified: Secondary | ICD-10-CM

## 2013-06-04 DIAGNOSIS — E291 Testicular hypofunction: Secondary | ICD-10-CM

## 2013-06-04 MED ORDER — ALPRAZOLAM 1 MG PO TABS: ORAL_TABLET | ORAL | Status: DC

## 2013-06-04 NOTE — Patient Instructions (Signed)
Please apply testosterone gel correctly -- under the arms.  Continue other medications as directed.  Return to lab in one month. I have placed the correct orders in the system -- they are to check your lipid panel, your testosterone level and your PSA.  Follow-up in 3 months. Please return sooner if you need anything.

## 2013-06-04 NOTE — Progress Notes (Signed)
Patient presents to clinic today to discuss recent lab results. Patient with history of low testosterone and hypogonadism. He is currently on AndroGel 1.62%. Is using four pumps daily.  Testosterone had been increasing to the mid normal range. However upon recent recheck, testosterone was found to be low. Lab result was repeated and verified.  Patient endorses continued use of AndroGel daily. States he had been applying medication as directed, felt the medicine was very "goopy". States now he has been applying some of the medicine to his shoulders as directed, but the rest he has been rubbing on his arms and sides. Patient is continuing other medications as directed. Recent A1c had also increased by 2 point, since patient had started using metformin as monotherapy. Patient did not feel he needed insulin anymore, as he had been working out and losing weight. Patient has restarted insulin per our instructions. Is endorsing morning blood sugars around 100 and afternoon blood sugars around 180-190.  Past Medical History  Diagnosis Date  . Asthma     history  . Arthritis   . History of chicken pox   . Adjustment disorder with mixed anxiety and depressed mood   . Diabetes mellitus     type 2  . Elevated blood pressure     history of high blood pressure readings  . Hypercholesteremia     Current Outpatient Prescriptions on File Prior to Visit  Medication Sig Dispense Refill  . aspirin 81 MG tablet Take 81 mg by mouth daily.      . Blood Glucose Monitoring Suppl (ONE TOUCH ULTRA SYSTEM KIT) W/DEVICE KIT 1 kit by Does not apply route once. Use to check blood sugar four times daily       . buPROPion (WELLBUTRIN XL) 150 MG 24 hr tablet TAKE 1 TABLET BY MOUTH ONCE DAILY  30 tablet  4  . fenofibrate 160 MG tablet TAKE 1 TABLET BY MOUTH ONCE DAILY  30 tablet  2  . glucose blood (ACCU-CHEK COMPACT STRIPS) test strip Use as instructed to check blood sugar eight times a day.  204 each  1  . insulin aspart  (NOVOLOG FLEXPEN) 100 UNIT/ML injection Inject 25 Units into the skin 3 (three) times daily before meals.  1 pen  6  . Insulin Pen Needle (RELION PEN NEEDLE 31G/8MM) 31G X 8 MM MISC Use as Directed daily to check blood sugars  100 each  3  . Insulin Syringe-Needle U-100 30G 1 ML MISC USE AS DIRECTED TO INJECT INSULIN DX: 250.00  100 each  3  . metFORMIN (GLUCOPHAGE) 1000 MG tablet Take 1 tablet (1,000 mg total) by mouth 2 (two) times daily with a meal.  60 tablet  2  . omeprazole (PRILOSEC) 20 MG capsule TAKE 1 CAPSULE (20 MG TOTAL) BY MOUTH DAILY.  30 capsule  2  . ONE TOUCH LANCETS MISC Use as directed  200 each  11  . ramipril (ALTACE) 10 MG capsule TAKE 2 CAPSULES BY MOUTH DAILY  60 capsule  3  . sertraline (ZOLOFT) 50 MG tablet TAKE 1&1/2 TABLETS (75 MG TOTAL) BY MOUTH DAILY.  45 tablet  4  . sildenafil (VIAGRA) 100 MG tablet Take 0.5-1 tablets (50-100 mg total) by mouth daily as needed for erectile dysfunction.  3 tablet  0  . simvastatin (ZOCOR) 40 MG tablet TAKE 1 TABLET BY MOUTH AT BEDTIME  30 tablet  2  . Testosterone (ANDROGEL PUMP) 20.25 MG/ACT (1.62%) GEL APPLY 4 SQUIRTS ONTO THE SKIN DAILY  2.5 g  0   No current facility-administered medications on file prior to visit.    No Known Allergies  Family History  Problem Relation Age of Onset  . Diabetes Mother   . Thyroid disease Mother     Questionable  . Heart disease Father     deceased    History   Social History  . Marital Status: Married    Spouse Name: N/A    Number of Children: N/A  . Years of Education: N/A   Social History Main Topics  . Smoking status: Never Smoker   . Smokeless tobacco: Never Used  . Alcohol Use: None  . Drug Use: None  . Sexual Activity: None   Other Topics Concern  . 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  All other ROS are negative.  BP 114/72  Pulse 85  Temp(Src) 97.7 F (36.5 C) (Oral)  Resp 16  Ht 6' (1.829 m)  Wt 208 lb 8 oz (94.575 kg)  BMI 28.27 kg/m2  SpO2 98%  Physical Exam  Vitals reviewed. Constitutional: He is oriented to person, place, and time and well-developed, well-nourished, and in no distress.  HENT:  Head: Normocephalic and atraumatic.  Nose: Nose normal.  Eyes: Conjunctivae are normal.  Neck: Neck supple.  Cardiovascular: Normal rate, regular rhythm, normal heart sounds and intact distal pulses.   Pulmonary/Chest: Effort normal and breath sounds normal. No respiratory distress. He has no wheezes. He has no rales. He exhibits no tenderness.  Lymphadenopathy:    He has no cervical adenopathy.  Neurological: He is alert and oriented to person, place, and time.  Skin: Skin is warm and dry. No rash noted.  Psychiatric: Affect normal.    Recent Results (from the past 2160 hour(s))  URINALYSIS, ROUTINE W REFLEX MICROSCOPIC     Status: Abnormal   Collection Time    04/14/13 11:50 AM      Result Value Ref Range   Color, Urine YELLOW  YELLOW   APPearance CLEAR  CLEAR   Specific Gravity, Urine 1.017  1.005 - 1.030   pH 5.5  5.0 - 8.0   Glucose, UA 100 (*) NEG mg/dL   Bilirubin Urine NEG  NEG   Ketones, ur NEG  NEG mg/dL   Hgb urine dipstick NEG  NEG   Protein, ur NEG  NEG mg/dL   Urobilinogen, UA 0.2  0.0 - 1.0 mg/dL   Nitrite NEG  NEG   Leukocytes, UA NEG  NEG  DRUG SCREEN, URINE     Status: Abnormal   Collection Time    04/14/13 11:50 AM      Result Value Ref Range   Benzodiazepines. POS (*) Negative   Comment: Result repeated and verified.   Phencyclidine (PCP) NEG  Negative   Cocaine Metabolites NEG  Negative   Amphetamine Screen, Ur NEG  Negative   Marijuana Metabolite NEG   Negative   Opiates NEG  Negative   Barbiturate Quant, Ur NEG  Negative  Methadone NEG  Negative   Propoxyphene NEG  Negative   Creatinine,U 74.00     Comment:        Cutoff Values for Urine Drug Screen, Pain Mgmt              Drug Class           Cutoff (ng/mL)              Amphetamines             500              Barbiturates             200              Cocaine Metabolites      150              Benzodiazepines          200              Methadone                300              Opiates                  300              Phencyclidine             25              Propoxyphene             300              Marijuana Metabolites     50            For medical purposes only.  TB SKIN TEST     Status: Normal   Collection Time    04/16/13 10:32 AM      Result Value Ref Range   TB Skin Test Negative     Induration 48 hours    CBC WITH DIFFERENTIAL     Status: None   Collection Time    05/26/13 10:09 AM      Result Value Ref Range   WBC 5.6  4.0 - 10.5 K/uL   RBC 4.47  4.22 - 5.81 MIL/uL   Hemoglobin 13.4  13.0 - 17.0 g/dL   HCT 39.8  39.0 - 52.0 %   MCV 89.0  78.0 - 100.0 fL   MCH 30.0  26.0 - 34.0 pg   MCHC 33.7  30.0 - 36.0 g/dL   RDW 14.0  11.5 - 15.5 %   Platelets 214  150 - 400 K/uL   Neutrophils Relative % 49  43 - 77 %   Neutro Abs 2.7  1.7 - 7.7 K/uL   Lymphocytes Relative 38  12 - 46 %   Lymphs Abs 2.1  0.7 - 4.0 K/uL   Monocytes Relative 7  3 - 12 %   Monocytes Absolute 0.4  0.1 - 1.0 K/uL   Eosinophils Relative 5  0 - 5 %   Eosinophils Absolute 0.3  0.0 - 0.7 K/uL   Basophils Relative 1  0 - 1 %   Basophils Absolute 0.1  0.0 - 0.1 K/uL   Smear Review Criteria for review not met    HEMOGLOBIN A1C     Status: Abnormal   Collection Time    05/26/13 10:09 AM  Result Value Ref Range   Hemoglobin A1C 9.4 (*) <5.7 %   Comment:                                                                            According to the ADA Clinical Practice Recommendations for  2011, when     HbA1c is used as a screening test:             >=6.5%   Diagnostic of Diabetes Mellitus                (if abnormal result is confirmed)           5.7-6.4%   Increased risk of developing Diabetes Mellitus           References:Diagnosis and Classification of Diabetes Mellitus,Diabetes     IRCV,8938,10(FBPZW 1):S62-S69 and Standards of Medical Care in             Diabetes - 2011,Diabetes Care,2011,34 (Suppl 1):S11-S61.         Mean Plasma Glucose 223 (*) <117 mg/dL  MICROALBUMIN / CREATININE URINE RATIO     Status: None   Collection Time    05/26/13 10:09 AM      Result Value Ref Range   Microalb, Ur 0.50  0.00 - 1.89 mg/dL   Creatinine, Urine 102.1     Microalb Creat Ratio 4.9  0.0 - 30.0 mg/g  TESTOSTERONE, FREE, TOTAL     Status: Abnormal   Collection Time    05/26/13 10:09 AM      Result Value Ref Range   Testosterone 161 (*) 300 - 890 ng/dL   Comment:           Tanner Stage       Male              Male                   I              < 30 ng/dL        < 10 ng/dL                   II             < 150 ng/dL       < 30 ng/dL                   III            100-320 ng/dL     < 35 ng/dL                   IV             200-970 ng/dL     15-40 ng/dL                   V/Adult        300-890 ng/dL     10-70 ng/dL                 Amended report.     TESTO REPEATED PER CLIENT. REPEAT RESULT= 175 L. CALLED TO CHRISTY  FREEMAN 06/01/13 AT 11:00AM. [facility]   Sex Hormone Binding 12 (*) 13 - 71 nmol/L   Testosterone, Free 47.7  47.0 - 244.0 pg/mL   Comment:       The concentration of free testosterone is derived from a mathematical     expression based on constants for the binding of testosterone to sex     hormone-binding globulin and albumin.   Testosterone-% Free 3.0 (*) 1.6 - 2.9 %  COMPREHENSIVE METABOLIC PANEL     Status: Abnormal   Collection Time    05/26/13 10:09 AM      Result Value Ref Range   Sodium 140  135 - 145 mEq/L   Potassium 4.5  3.5 - 5.3  mEq/L   Chloride 103  96 - 112 mEq/L   CO2 25  19 - 32 mEq/L   Glucose, Bld 111 (*) 70 - 99 mg/dL   BUN 21  6 - 23 mg/dL   Creat 0.97  0.50 - 1.35 mg/dL   Total Bilirubin 0.4  0.2 - 1.2 mg/dL   Alkaline Phosphatase 63  39 - 117 U/L   AST 37  0 - 37 U/L   ALT 30  0 - 53 U/L   Total Protein 6.4  6.0 - 8.3 g/dL   Albumin 4.0  3.5 - 5.2 g/dL   Calcium 8.8  8.4 - 10.5 mg/dL    Assessment/Plan: Testosterone deficiency We'll continue current regimen. Patient advised to apply medication only as instructed, to ensure appropriate absorption of medication. We'll recheck testosterone in one month.  DIABETES-TYPE 2 Insulin has been resumed. Patient to monitor blood sugars. We'll recheck BMP in one month. Repeat A1c in 3 months. Patient to call if a.m. blood glucose is below 70.

## 2013-06-04 NOTE — Telephone Encounter (Signed)
Received paper work for alprazolam, forward to nurse

## 2013-06-04 NOTE — Progress Notes (Signed)
Pre visit review using our clinic review tool, if applicable. No additional management support is needed unless otherwise documented below in the visit note/SLS  

## 2013-06-07 NOTE — Telephone Encounter (Signed)
This was a pharmacy error; No PA needed/SLS

## 2013-06-08 NOTE — Assessment & Plan Note (Signed)
We'll continue current regimen. Patient advised to apply medication only as instructed, to ensure appropriate absorption of medication. We'll recheck testosterone in one month.

## 2013-06-08 NOTE — Assessment & Plan Note (Signed)
Insulin has been resumed. Patient to monitor blood sugars. We'll recheck BMP in one month. Repeat A1c in 3 months. Patient to call if a.m. blood glucose is below 70.

## 2013-07-07 ENCOUNTER — Other Ambulatory Visit: Payer: Self-pay | Admitting: Physician Assistant

## 2013-07-07 NOTE — Telephone Encounter (Signed)
Rx request to pharmacy/SLS Patient informed, understood; pt is overdue for Cholesterol labs and needs a follow-up appointment. Pt would like to come in for labs first; informed pt that provider will be out of office until Friday, 06.05.15 and I will verify with provider as to the timeline he wants for patient to have follow-up & labs/SLS

## 2013-07-09 ENCOUNTER — Ambulatory Visit (INDEPENDENT_AMBULATORY_CARE_PROVIDER_SITE_OTHER): Payer: BC Managed Care – PPO | Admitting: Physician Assistant

## 2013-07-09 ENCOUNTER — Encounter: Payer: Self-pay | Admitting: Physician Assistant

## 2013-07-09 VITALS — BP 144/82 | HR 79 | Temp 98.2°F | Resp 16 | Ht 72.0 in | Wt 211.0 lb

## 2013-07-09 DIAGNOSIS — L723 Sebaceous cyst: Secondary | ICD-10-CM

## 2013-07-09 DIAGNOSIS — J069 Acute upper respiratory infection, unspecified: Secondary | ICD-10-CM

## 2013-07-09 DIAGNOSIS — L729 Follicular cyst of the skin and subcutaneous tissue, unspecified: Secondary | ICD-10-CM | POA: Insufficient documentation

## 2013-07-09 DIAGNOSIS — E119 Type 2 diabetes mellitus without complications: Secondary | ICD-10-CM | POA: Insufficient documentation

## 2013-07-09 DIAGNOSIS — N529 Male erectile dysfunction, unspecified: Secondary | ICD-10-CM

## 2013-07-09 MED ORDER — INSULIN GLARGINE 100 UNIT/ML ~~LOC~~ SOLN: 60.0000 [IU] | Freq: Every morning | SUBCUTANEOUS | Status: DC

## 2013-07-09 MED ORDER — SILDENAFIL CITRATE 100 MG PO TABS: 50.0000 mg | ORAL_TABLET | Freq: Every day | ORAL | Status: DC | PRN

## 2013-07-09 NOTE — Assessment & Plan Note (Signed)
Increase fluid intake.  Rest.  Saline nasal spray.  Multivitamin.  Humidifier in bedroom.  Tylenol for sore throat.

## 2013-07-09 NOTE — Progress Notes (Signed)
Pre visit review using our clinic review tool, if applicable. No additional management support is needed unless otherwise documented below in the visit note/SLS  

## 2013-07-09 NOTE — Patient Instructions (Signed)
Increase fluids and get plenty of rest.  Use saline nasal spray.  Place a humidifier in the bedroom.  Delsym for cough if it develops.  Tylenol for sore throat.

## 2013-07-09 NOTE — Progress Notes (Signed)
Patient presents to clinic today c/o 1 day of sore throat and post-nasal drip.  Patient denies fever, chills, aches, SOB, wheezing, sinus pain, ear pain or tooth pain.  Denies cough at present. Recent travel via car to and from [place]. Endorses having the [facility] turned on high all the way home.   Patient also complains of a "lump" on the right-side of his back over the shoulder blade that has been present over the past 2 months.    Past Medical History  Diagnosis Date  . Asthma     history  . Arthritis   . History of chicken pox   . Adjustment disorder with mixed anxiety and depressed mood   . Diabetes mellitus     type 2  . Elevated blood pressure     history of high blood pressure readings  . Hypercholesteremia     Current Outpatient Prescriptions on File Prior to Visit  Medication Sig Dispense Refill  . ALPRAZolam (XANAX) 1 MG tablet TAKE 1-2 TABLETS BY MOUTH DAILY AT BEDTIME AS NEEDED FOR SLEEP AND ANXIETY.  60 tablet  0  . ANDROGEL PUMP 20.25 MG/ACT (1.62%) GEL APPLY 4 SQUIRTS ONTO THE SKIN DAILY  2.5 g  0  . aspirin 81 MG tablet Take 81 mg by mouth daily.      . Blood Glucose Monitoring Suppl (ONE TOUCH ULTRA SYSTEM KIT) W/DEVICE KIT 1 kit by Does not apply route once. Use to check blood sugar four times daily       . buPROPion (WELLBUTRIN XL) 150 MG 24 hr tablet TAKE 1 TABLET BY MOUTH ONCE DAILY  30 tablet  4  . glucose blood (ACCU-CHEK COMPACT STRIPS) test strip Use as instructed to check blood sugar eight times a day.  204 each  1  . insulin aspart (NOVOLOG FLEXPEN) 100 UNIT/ML injection Inject 25 Units into the skin 3 (three) times daily before meals.  1 pen  6  . Insulin Pen Needle (RELION PEN NEEDLE 31G/8MM) 31G X 8 MM MISC Use as Directed daily to check blood sugars  100 each  3  . Insulin Syringe-Needle U-100 30G 1 ML MISC USE AS DIRECTED TO INJECT INSULIN DX: 250.00  100 each  3  . metFORMIN (GLUCOPHAGE) 1000 MG tablet TAKE 1 TABLET (1,000 MG) BY MOUTH 2 TIMES DAILY WITH A  MEAL.  60 tablet  2  . omeprazole (PRILOSEC) 20 MG capsule TAKE 1 CAPSULE BY MOUTH DAILY.  30 capsule  2  . ONE TOUCH LANCETS MISC Use as directed  200 each  11  . ramipril (ALTACE) 10 MG capsule TAKE 2 CAPSULES BY MOUTH DAILY  60 capsule  3  . sertraline (ZOLOFT) 50 MG tablet TAKE 1&1/2 TABLETS (75 MG TOTAL) BY MOUTH DAILY.  45 tablet  4  . simvastatin (ZOCOR) 40 MG tablet TAKE 1 TABLET BY MOUTH AT BEDTIME  30 tablet  2   No current facility-administered medications on file prior to visit.    No Known Allergies  Family History  Problem Relation Age of Onset  . Diabetes Mother   . Thyroid disease Mother     Questionable  . Heart disease Father     deceased    History   Social History  . Marital Status: Married    Spouse Name: N/A    Number of Children: N/A  . Years of Education: N/A   Social History Main Topics  . Smoking status: Never Smoker   . Smokeless tobacco: Never  Used  . Alcohol Use: None  . Drug Use: None  . Sexual Activity: None   Other Topics Concern  . None   Social History Narrative   Occupation: Engineer, site   Married -92 marriage (2nd marriage)   daughter 54,  2 sons (2nd marriage  11,6)   [place]   Never Smoked    Alcohol use-yes   Drug use-no    Regular exercise-no   Smoking Status:  never   Does Patient Exercise:  no   Caffeine use/day:  3-4 cups coffee daily   Drug Use:  no         Review of Systems - See HPI.  All other ROS are negative.  BP 144/82  Pulse 79  Temp(Src) 98.2 F (36.8 C) (Oral)  Resp 16  Ht 6' (1.829 m)  Wt 211 lb (95.709 kg)  BMI 28.61 kg/m2  SpO2 98%  Physical Exam  Vitals reviewed. Constitutional: He is oriented to person, place, and time and well-developed, well-nourished, and in no distress.  HENT:  Head: Normocephalic and atraumatic.  Right Ear: External ear normal.  Left Ear: External ear normal.  Nose: Nose normal.  Mouth/Throat: Oropharynx is clear and moist. No oropharyngeal exudate.  TM within  normal limits bilaterally.  No TTP of sinuses on exam.  Eyes: Conjunctivae are normal. Pupils are equal, round, and reactive to light.  Neck: Neck supple.  Cardiovascular: Normal rate, regular rhythm, normal heart sounds and intact distal pulses.   Pulmonary/Chest: Effort normal and breath sounds normal. No respiratory distress. He has no wheezes. He has no rales. He exhibits no tenderness.  Lymphadenopathy:    He has no cervical adenopathy.  Neurological: He is alert and oriented to person, place, and time.  Skin: Skin is warm and dry. No rash noted.  Presence of a 5 mm cyst on R shoulder.  No concerning mass or lesion.  Psychiatric: Affect normal.    Recent Results (from the past 2160 hour(s))  URINALYSIS, ROUTINE W REFLEX MICROSCOPIC     Status: Abnormal   Collection Time    04/14/13 11:50 AM      Result Value Ref Range   Color, Urine YELLOW  YELLOW   APPearance CLEAR  CLEAR   Specific Gravity, Urine 1.017  1.005 - 1.030   pH 5.5  5.0 - 8.0   Glucose, UA 100 (*) NEG mg/dL   Bilirubin Urine NEG  NEG   Ketones, ur NEG  NEG mg/dL   Hgb urine dipstick NEG  NEG   Protein, ur NEG  NEG mg/dL   Urobilinogen, UA 0.2  0.0 - 1.0 mg/dL   Nitrite NEG  NEG   Leukocytes, UA NEG  NEG  DRUG SCREEN, URINE     Status: Abnormal   Collection Time    04/14/13 11:50 AM      Result Value Ref Range   Benzodiazepines. POS (*) Negative   Comment: Result repeated and verified.   Phencyclidine (PCP) NEG  Negative   Cocaine Metabolites NEG  Negative   Amphetamine Screen, Ur NEG  Negative   Marijuana Metabolite NEG  Negative   Opiates NEG  Negative   Barbiturate Quant, Ur NEG  Negative   Methadone NEG  Negative   Propoxyphene NEG  Negative   Creatinine,U 74.00     Comment:        Cutoff Values for Urine Drug Screen, Pain Mgmt              Drug  Class           Cutoff (ng/mL)              Amphetamines             500              Barbiturates             200              Cocaine Metabolites       150              Benzodiazepines          200              Methadone                300              Opiates                  300              Phencyclidine             25              Propoxyphene             300              Marijuana Metabolites     50            For medical purposes only.  TB SKIN TEST     Status: Normal   Collection Time    04/16/13 10:32 AM      Result Value Ref Range   TB Skin Test Negative     Induration 48 hours    CBC WITH DIFFERENTIAL     Status: None   Collection Time    05/26/13 10:09 AM      Result Value Ref Range   WBC 5.6  4.0 - 10.5 K/uL   RBC 4.47  4.22 - 5.81 MIL/uL   Hemoglobin 13.4  13.0 - 17.0 g/dL   HCT 39.8  39.0 - 52.0 %   MCV 89.0  78.0 - 100.0 fL   MCH 30.0  26.0 - 34.0 pg   MCHC 33.7  30.0 - 36.0 g/dL   RDW 14.0  11.5 - 15.5 %   Platelets 214  150 - 400 K/uL   Neutrophils Relative % 49  43 - 77 %   Neutro Abs 2.7  1.7 - 7.7 K/uL   Lymphocytes Relative 38  12 - 46 %   Lymphs Abs 2.1  0.7 - 4.0 K/uL   Monocytes Relative 7  3 - 12 %   Monocytes Absolute 0.4  0.1 - 1.0 K/uL   Eosinophils Relative 5  0 - 5 %   Eosinophils Absolute 0.3  0.0 - 0.7 K/uL   Basophils Relative 1  0 - 1 %   Basophils Absolute 0.1  0.0 - 0.1 K/uL   Smear Review Criteria for review not met    HEMOGLOBIN A1C     Status: Abnormal   Collection Time    05/26/13 10:09 AM      Result Value Ref Range   Hemoglobin A1C 9.4 (*) <5.7 %   Comment:  According to the ADA Clinical Practice Recommendations for 2011, when     HbA1c is used as a screening test:             >=6.5%   Diagnostic of Diabetes Mellitus                (if abnormal result is confirmed)           5.7-6.4%   Increased risk of developing Diabetes Mellitus           References:Diagnosis and Classification of Diabetes Mellitus,Diabetes     ASTM,1962,22(LNLGX 1):S62-S69 and Standards of Medical Care in             Diabetes  - 2011,Diabetes QJJH,4174,08 (Suppl 1):S11-S61.         Mean Plasma Glucose 223 (*) <117 mg/dL  MICROALBUMIN / CREATININE URINE RATIO     Status: None   Collection Time    05/26/13 10:09 AM      Result Value Ref Range   Microalb, Ur 0.50  0.00 - 1.89 mg/dL   Creatinine, Urine 102.1     Microalb Creat Ratio 4.9  0.0 - 30.0 mg/g  TESTOSTERONE, FREE, TOTAL     Status: Abnormal   Collection Time    05/26/13 10:09 AM      Result Value Ref Range   Testosterone 161 (*) 300 - 890 ng/dL   Comment:           Tanner Stage       Male              Male                   I              < 30 ng/dL        < 10 ng/dL                   II             < 150 ng/dL       < 30 ng/dL                   III            100-320 ng/dL     < 35 ng/dL                   IV             200-970 ng/dL     15-40 ng/dL                   V/Adult        300-890 ng/dL     10-70 ng/dL                 Amended report.     TESTO REPEATED PER CLIENT. REPEAT RESULT= 175 L. CALLED TO CHRISTY     FREEMAN 06/01/13 AT 11:00AM. GOOCA   Sex Hormone Binding 12 (*) 13 - 71 nmol/L   Testosterone, Free 47.7  47.0 - 244.0 pg/mL   Comment:       The concentration of free testosterone is derived from a mathematical     expression based on constants for the binding of testosterone to sex     hormone-binding globulin and albumin.   Testosterone-% Free 3.0 (*) 1.6 - 2.9 %  COMPREHENSIVE METABOLIC PANEL     Status: Abnormal  Collection Time    05/26/13 10:09 AM      Result Value Ref Range   Sodium 140  135 - 145 mEq/L   Potassium 4.5  3.5 - 5.3 mEq/L   Chloride 103  96 - 112 mEq/L   CO2 25  19 - 32 mEq/L   Glucose, Bld 111 (*) 70 - 99 mg/dL   BUN 21  6 - 23 mg/dL   Creat 0.97  0.50 - 1.35 mg/dL   Total Bilirubin 0.4  0.2 - 1.2 mg/dL   Alkaline Phosphatase 63  39 - 117 U/L   AST 37  0 - 37 U/L   ALT 30  0 - 53 U/L   Total Protein 6.4  6.0 - 8.3 g/dL   Albumin 4.0  3.5 - 5.2 g/dL   Calcium 8.8  8.4 - 10.5 mg/dL     Assessment/Plan: Viral URI Increase fluid intake.  Rest.  Saline nasal spray.  Multivitamin.  Humidifier in bedroom.  Tylenol for sore throat.  Cyst of skin Benign.  No treatment needed.

## 2013-07-09 NOTE — Assessment & Plan Note (Signed)
Benign.  No treatment needed.

## 2013-07-14 ENCOUNTER — Encounter: Payer: Self-pay | Admitting: Physician Assistant

## 2013-07-14 DIAGNOSIS — J209 Acute bronchitis, unspecified: Secondary | ICD-10-CM

## 2013-07-14 MED ORDER — AZITHROMYCIN 250 MG PO TABS: ORAL_TABLET | ORAL | Status: DC

## 2013-07-27 LAB — HM DIABETES EYE EXAM

## 2013-07-29 ENCOUNTER — Encounter: Payer: Self-pay | Admitting: Physician Assistant

## 2013-08-02 LAB — TESTOSTERONE, FREE, TOTAL, SHBG
SEX HORMONE BINDING: 13 nmol/L (ref 13–71)
TESTOSTERONE FREE: 35.1 pg/mL — AB (ref 47.0–244.0)
TESTOSTERONE: 123 ng/dL — AB (ref 300–890)
Testosterone-% Free: 2.9 % (ref 1.6–2.9)

## 2013-08-03 ENCOUNTER — Telehealth: Payer: Self-pay | Admitting: Physician Assistant

## 2013-08-03 NOTE — Telephone Encounter (Signed)
Patient is requesting last testosterone results. He would like results by this afternoon that way he will know to refill his medication or not?? Please call patient on work phone. Thanks

## 2013-08-03 NOTE — Telephone Encounter (Signed)
Please inform patient that I am not in the office today.  His Testosterone level has continued to decline. We may have to consider I different form of therapy -- patches or injections. I will try to talk to him personally tomorrow when I am back in clinic.

## 2013-08-04 ENCOUNTER — Telehealth: Payer: Self-pay | Admitting: *Deleted

## 2013-08-04 ENCOUNTER — Encounter: Payer: Self-pay | Admitting: Physician Assistant

## 2013-08-04 ENCOUNTER — Ambulatory Visit (INDEPENDENT_AMBULATORY_CARE_PROVIDER_SITE_OTHER): Payer: BC Managed Care – PPO | Admitting: Physician Assistant

## 2013-08-04 VITALS — BP 112/76 | HR 71 | Temp 98.0°F | Resp 16 | Ht 72.0 in | Wt 215.2 lb

## 2013-08-04 DIAGNOSIS — N528 Other male erectile dysfunction: Secondary | ICD-10-CM

## 2013-08-04 DIAGNOSIS — E291 Testicular hypofunction: Secondary | ICD-10-CM | POA: Insufficient documentation

## 2013-08-04 DIAGNOSIS — N529 Male erectile dysfunction, unspecified: Secondary | ICD-10-CM

## 2013-08-04 MED ORDER — SILDENAFIL CITRATE 100 MG PO TABS: 50.0000 mg | ORAL_TABLET | Freq: Every day | ORAL | Status: DC | PRN

## 2013-08-04 NOTE — Progress Notes (Signed)
Patient presents to clinic today to discuss other options for testosterone therapy.  Patient previously on Androgel with increasing testosterone levels.  Over the past several months, T levels have steadily decreased despite continued consisted and correct administration of the androgel.    Past Medical History  Diagnosis Date  . Asthma     history  . Arthritis   . History of chicken pox   . Adjustment disorder with mixed anxiety and depressed mood   . Diabetes mellitus     type 2  . Elevated blood pressure     history of high blood pressure readings  . Hypercholesteremia     Current Outpatient Prescriptions on File Prior to Visit  Medication Sig Dispense Refill  . ALPRAZolam (XANAX) 1 MG tablet TAKE 1-2 TABLETS BY MOUTH DAILY AT BEDTIME AS NEEDED FOR SLEEP AND ANXIETY.  60 tablet  0  . aspirin 81 MG tablet Take 81 mg by mouth daily.      . Blood Glucose Monitoring Suppl (ONE TOUCH ULTRA SYSTEM KIT) W/DEVICE KIT 1 kit by Does not apply route once. Use to check blood sugar four times daily       . buPROPion (WELLBUTRIN XL) 150 MG 24 hr tablet TAKE 1 TABLET BY MOUTH ONCE DAILY  30 tablet  4  . glucose blood (ACCU-CHEK COMPACT STRIPS) test strip Use as instructed to check blood sugar eight times a day.  204 each  1  . insulin aspart (NOVOLOG FLEXPEN) 100 UNIT/ML injection Inject 25 Units into the skin 3 (three) times daily before meals.  1 pen  6  . insulin glargine (LANTUS) 100 UNIT/ML injection Inject 0.6 mLs (60 Units total) into the skin every morning. Lantus Solostar  10 mL  5  . Insulin Pen Needle (RELION PEN NEEDLE 31G/8MM) 31G X 8 MM MISC Use as Directed daily to check blood sugars  100 each  3  . Insulin Syringe-Needle U-100 30G 1 ML MISC USE AS DIRECTED TO INJECT INSULIN DX: 250.00  100 each  3  . metFORMIN (GLUCOPHAGE) 1000 MG tablet TAKE 1 TABLET (1,000 MG) BY MOUTH 2 TIMES DAILY WITH A MEAL.  60 tablet  2  . omeprazole (PRILOSEC) 20 MG capsule TAKE 1 CAPSULE BY MOUTH DAILY.  30  capsule  2  . ONE TOUCH LANCETS MISC Use as directed  200 each  11  . ramipril (ALTACE) 10 MG capsule TAKE 2 CAPSULES BY MOUTH DAILY  60 capsule  3  . sertraline (ZOLOFT) 50 MG tablet TAKE 1&1/2 TABLETS (75 MG TOTAL) BY MOUTH DAILY.  45 tablet  4  . simvastatin (ZOCOR) 40 MG tablet TAKE 1 TABLET BY MOUTH AT BEDTIME  30 tablet  2   No current facility-administered medications on file prior to visit.    No Known Allergies  Family History  Problem Relation Age of Onset  . Diabetes Mother   . Thyroid disease Mother     Questionable  . Heart disease Father     deceased    History   Social History  . Marital Status: Married    Spouse Name: N/A    Number of Children: N/A  . Years of Education: N/A   Social History Main Topics  . Smoking status: Never Smoker   . Smokeless tobacco: Never Used  . Alcohol Use: None  . Drug Use: None  . Sexual Activity: None   Other Topics Concern  . None   Social History Narrative   Occupation: Personal assistant  [employer]   Married -26 marriage (2nd marriage)   daughter 75,  2 sons (2nd marriage  11,6)   [place]   Never Smoked    Alcohol use-yes   Drug use-no    Regular exercise-no   Smoking Status:  never   Does Patient Exercise:  no   Caffeine use/day:  3-4 cups coffee daily   Drug Use:  no         Review of Systems - See HPI.  All other ROS are negative.  BP 112/76  Pulse 71  Temp(Src) 98 F (36.7 C) (Oral)  Resp 16  Ht 6' (1.829 m)  Wt 215 lb 4 oz (97.637 kg)  BMI 29.19 kg/m2  SpO2 98%  Physical Exam  Vitals reviewed. Constitutional: He is oriented to person, place, and time and well-developed, well-nourished, and in no distress.  HENT:  Head: Normocephalic and atraumatic.  Cardiovascular: Normal rate, regular rhythm, normal heart sounds and intact distal pulses.   Pulmonary/Chest: Effort normal and breath sounds normal. No respiratory distress. He has no wheezes. He has no rales. He exhibits no tenderness.  Neurological: He is  alert and oriented to person, place, and time.  Skin: Skin is warm and dry. No rash noted.  Psychiatric: Affect normal.    Recent Results (from the past 2160 hour(s))  CBC WITH DIFFERENTIAL     Status: None   Collection Time    05/26/13 10:09 AM      Result Value Ref Range   WBC 5.6  4.0 - 10.5 K/uL   RBC 4.47  4.22 - 5.81 MIL/uL   Hemoglobin 13.4  13.0 - 17.0 g/dL   HCT 39.8  39.0 - 52.0 %   MCV 89.0  78.0 - 100.0 fL   MCH 30.0  26.0 - 34.0 pg   MCHC 33.7  30.0 - 36.0 g/dL   RDW 14.0  11.5 - 15.5 %   Platelets 214  150 - 400 K/uL   Neutrophils Relative % 49  43 - 77 %   Neutro Abs 2.7  1.7 - 7.7 K/uL   Lymphocytes Relative 38  12 - 46 %   Lymphs Abs 2.1  0.7 - 4.0 K/uL   Monocytes Relative 7  3 - 12 %   Monocytes Absolute 0.4  0.1 - 1.0 K/uL   Eosinophils Relative 5  0 - 5 %   Eosinophils Absolute 0.3  0.0 - 0.7 K/uL   Basophils Relative 1  0 - 1 %   Basophils Absolute 0.1  0.0 - 0.1 K/uL   Smear Review Criteria for review not met    HEMOGLOBIN A1C     Status: Abnormal   Collection Time    05/26/13 10:09 AM      Result Value Ref Range   Hemoglobin A1C 9.4 (*) <5.7 %   Comment:                                                                            According to the ADA Clinical Practice Recommendations for 2011, when     HbA1c is used as a screening test:             >=6.5%   Diagnostic  of Diabetes Mellitus                (if abnormal result is confirmed)           5.7-6.4%   Increased risk of developing Diabetes Mellitus           References:Diagnosis and Classification of Diabetes Mellitus,Diabetes     OOIL,5797,28(ASUOR 1):S62-S69 and Standards of Medical Care in             Diabetes - 2011,Diabetes VIFB,3794,32 (Suppl 1):S11-S61.         Mean Plasma Glucose 223 (*) <117 mg/dL  MICROALBUMIN / CREATININE URINE RATIO     Status: None   Collection Time    05/26/13 10:09 AM      Result Value Ref Range   Microalb, Ur 0.50  0.00 - 1.89 mg/dL   Creatinine, Urine  102.1     Microalb Creat Ratio 4.9  0.0 - 30.0 mg/g  TESTOSTERONE, FREE, TOTAL     Status: Abnormal   Collection Time    05/26/13 10:09 AM      Result Value Ref Range   Testosterone 161 (*) 300 - 890 ng/dL   Comment:           Tanner Stage       Male              Male                   I              < 30 ng/dL        < 10 ng/dL                   II             < 150 ng/dL       < 30 ng/dL                   III            100-320 ng/dL     < 35 ng/dL                   IV             200-970 ng/dL     15-40 ng/dL                   V/Adult        300-890 ng/dL     10-70 ng/dL                 Amended report.     TESTO REPEATED PER CLIENT. REPEAT RESULT= 175 L. CALLED TO CHRISTY     FREEMAN 06/01/13 AT 11:00AM. GOOCA   Sex Hormone Binding 12 (*) 13 - 71 nmol/L   Testosterone, Free 47.7  47.0 - 244.0 pg/mL   Comment:       The concentration of free testosterone is derived from a mathematical     expression based on constants for the binding of testosterone to sex     hormone-binding globulin and albumin.   Testosterone-% Free 3.0 (*) 1.6 - 2.9 %  COMPREHENSIVE METABOLIC PANEL     Status: Abnormal   Collection Time    05/26/13 10:09 AM      Result Value Ref Range   Sodium 140  135 - 145 mEq/L   Potassium 4.5  3.5 - 5.3 mEq/L  Chloride 103  96 - 112 mEq/L   CO2 25  19 - 32 mEq/L   Glucose, Bld 111 (*) 70 - 99 mg/dL   BUN 21  6 - 23 mg/dL   Creat 0.97  0.50 - 1.35 mg/dL   Total Bilirubin 0.4  0.2 - 1.2 mg/dL   Alkaline Phosphatase 63  39 - 117 U/L   AST 37  0 - 37 U/L   ALT 30  0 - 53 U/L   Total Protein 6.4  6.0 - 8.3 g/dL   Albumin 4.0  3.5 - 5.2 g/dL   Calcium 8.8  8.4 - 10.5 mg/dL  TESTOSTERONE, FREE, TOTAL     Status: Abnormal   Collection Time    07/30/13 11:00 AM      Result Value Ref Range   Testosterone 123 (*) 300 - 890 ng/dL   Comment:           Tanner Stage       Male              Male                   I              < 30 ng/dL        < 10 ng/dL                    II             < 150 ng/dL       < 30 ng/dL                   III            100-320 ng/dL     < 35 ng/dL                   IV             200-970 ng/dL     15-40 ng/dL                   V/Adult        300-890 ng/dL     10-70 ng/dL         Sex Hormone Binding 13  13 - 71 nmol/L   Testosterone, Free 35.1 (*) 47.0 - 244.0 pg/mL   Comment:       The concentration of free testosterone is derived from a mathematical     expression based on constants for the binding of testosterone to sex     hormone-binding globulin and albumin.   Testosterone-% Free 2.9  1.6 - 2.9 %    Assessment/Plan: Primary male hypogonadism Will stop Androfel.  Will begin Depo Testosterone 200 mg every 2 weeks, pending normal PSA level that has been ordered.

## 2013-08-04 NOTE — Assessment & Plan Note (Signed)
Will stop Androfel.  Will begin Depo Testosterone 200 mg every 2 weeks, pending normal PSA level that has been ordered.

## 2013-08-04 NOTE — Patient Instructions (Signed)
Please obtain labs.  I will call you with your results.  As soon as we have a normal PSA level, you can schedule a nurse visit for William [name] to administer your injections.  Continue other medications as directed.  Follow-up in 2 months. We will reassess Testosterone levels at that time.

## 2013-08-04 NOTE — Telephone Encounter (Signed)
Left detailed message on pt's voice mail

## 2013-08-04 NOTE — Telephone Encounter (Signed)
Per VO provider, Ok to give refill today 07.01.15, as pt is in office for visit; Rx request phoned in to pharmacy, spoke w/Ben/SLS

## 2013-08-04 NOTE — Progress Notes (Signed)
Pre visit review using our clinic review tool, if applicable. No additional management support is needed unless otherwise documented below in the visit note/SLS  

## 2013-08-05 LAB — PSA: PSA: 0.46 ng/mL (ref <=4.00)

## 2013-08-11 ENCOUNTER — Telehealth: Payer: Self-pay | Admitting: *Deleted

## 2013-08-11 ENCOUNTER — Other Ambulatory Visit: Payer: Self-pay | Admitting: *Deleted

## 2013-08-11 DIAGNOSIS — E291 Testicular hypofunction: Secondary | ICD-10-CM

## 2013-08-11 MED ORDER — TESTOSTERONE CYPIONATE 200 MG/ML IM SOLN: 200.0000 mg | INTRAMUSCULAR | Status: DC

## 2013-08-11 NOTE — Telephone Encounter (Signed)
Received PA request form from [facility] for new Rx on Depo-Testosterone injectible; completed paperwork & faxed to [phone] for Approval/SLS

## 2013-08-11 NOTE — Progress Notes (Signed)
New Rx faxed to pharmacy per provider instructions; pt informed, will be in at 4:00pm today for initial injection/SLS

## 2013-08-13 NOTE — Telephone Encounter (Signed)
Approval for Testosterone received; faxed to pharmacy, scheduler to call pt for nurse visit appt/SLS

## 2013-08-16 ENCOUNTER — Encounter: Payer: Self-pay | Admitting: Physician Assistant

## 2013-08-16 ENCOUNTER — Ambulatory Visit (INDEPENDENT_AMBULATORY_CARE_PROVIDER_SITE_OTHER): Payer: BC Managed Care – PPO | Admitting: *Deleted

## 2013-08-16 DIAGNOSIS — E349 Endocrine disorder, unspecified: Secondary | ICD-10-CM

## 2013-08-16 DIAGNOSIS — E291 Testicular hypofunction: Secondary | ICD-10-CM

## 2013-08-16 MED ORDER — TESTOSTERONE CYPIONATE 200 MG/ML IM SOLN
200.0000 mg | INTRAMUSCULAR | Status: DC
  Administered 2013-08-16: 200 mg via INTRAMUSCULAR

## 2013-08-16 NOTE — Progress Notes (Signed)
   Subjective:    Patient ID: William [name], male    DOB: 1960-08-11, 53 y.o.   MRN: [id number]  HPI    Review of Systems     Objective:   Physical Exam        Assessment & Plan:  After obtaining consent, and per orders of W. Elyn Aquas, PA-C, injection of Testosterone Cypionate 200 mg/mL was given by SHARON SCATES, CMA (AAMA). Patient tolerated well and instructed to report any adverse reaction immediately/SLS

## 2013-08-18 ENCOUNTER — Ambulatory Visit: Payer: BC Managed Care – PPO | Admitting: Physician Assistant

## 2013-08-30 ENCOUNTER — Telehealth: Payer: Self-pay | Admitting: Physician Assistant

## 2013-08-30 ENCOUNTER — Encounter: Payer: Self-pay | Admitting: Physician Assistant

## 2013-08-30 ENCOUNTER — Ambulatory Visit (INDEPENDENT_AMBULATORY_CARE_PROVIDER_SITE_OTHER): Payer: BC Managed Care – PPO | Admitting: Physician Assistant

## 2013-08-30 ENCOUNTER — Other Ambulatory Visit: Payer: Self-pay | Admitting: Physician Assistant

## 2013-08-30 VITALS — BP 118/76 | HR 76 | Temp 98.1°F | Resp 16 | Ht 72.0 in | Wt 216.2 lb

## 2013-08-30 DIAGNOSIS — E119 Type 2 diabetes mellitus without complications: Secondary | ICD-10-CM

## 2013-08-30 DIAGNOSIS — E349 Endocrine disorder, unspecified: Secondary | ICD-10-CM

## 2013-08-30 DIAGNOSIS — E291 Testicular hypofunction: Secondary | ICD-10-CM

## 2013-08-30 LAB — LIPID PANEL
CHOL/HDL RATIO: 4
Cholesterol: 143 mg/dL (ref 0–200)
HDL: 32 mg/dL — AB (ref >39.00)
LDL CALC: 85 mg/dL (ref 0–99)
NonHDL: 111
Triglycerides: 131 mg/dL (ref 0.0–149.0)
VLDL: 26.2 mg/dL (ref 0.0–40.0)

## 2013-08-30 LAB — HEMOGLOBIN A1C: Hgb A1c MFr Bld: 6.4 % (ref 4.6–6.5)

## 2013-08-30 MED ORDER — TESTOSTERONE CYPIONATE 200 MG/ML IM SOLN: 200.0000 mg | INTRAMUSCULAR | Status: DC

## 2013-08-30 MED ORDER — "NEEDLE (DISP) 21G X 1"" MISC": Status: DC

## 2013-08-30 MED ORDER — "SYRINGE/NEEDLE (DISP) 23G X 1"" 3 ML MISC": Status: DC

## 2013-08-30 MED ORDER — TESTOSTERONE CYPIONATE 200 MG/ML IM SOLN
200.0000 mg | INTRAMUSCULAR | Status: DC
  Administered 2013-08-30: 200 mg via INTRAMUSCULAR

## 2013-08-30 NOTE — Telephone Encounter (Signed)
Medications have been faxed/SLS

## 2013-08-30 NOTE — Progress Notes (Signed)
Pre visit review using our clinic review tool, if applicable. No additional management support is needed unless otherwise documented below in the visit note/SLS  

## 2013-08-30 NOTE — Telephone Encounter (Signed)
Caller name: Renan Relation to pt: Call back [phone] or [phone]  Pharmacy: [facility]   Reason for call:  Pt is at pharmacy and wants to know if his medications are going to be sent over,  Faxed, or does he need to come pick up script.

## 2013-08-30 NOTE — Progress Notes (Signed)
Patient presents to clinic today to discuss medication management.  Patient receiving testosterone injections every 2 weeks for hypogonadism and low testosterone levels. Is due for injection today.  Would like to receive training so that he can self-administer injections.  Patient also due for repeat A1C and lipid panel.    Past Medical History  Diagnosis Date  . Asthma     history  . Arthritis   . History of chicken pox   . Adjustment disorder with mixed anxiety and depressed mood   . Diabetes mellitus     type 2  . Elevated blood pressure     history of high blood pressure readings  . Hypercholesteremia     Current Outpatient Prescriptions on File Prior to Visit  Medication Sig Dispense Refill  . ALPRAZolam (XANAX) 1 MG tablet TAKE 1-2 TABLETS BY MOUTH DAILY AT BEDTIME AS NEEDED FOR SLEEP AND ANXIETY.  60 tablet  0  . aspirin 81 MG tablet Take 81 mg by mouth daily.      . Blood Glucose Monitoring Suppl (ONE TOUCH ULTRA SYSTEM KIT) W/DEVICE KIT 1 kit by Does not apply route once. Use to check blood sugar four times daily       . buPROPion (WELLBUTRIN XL) 150 MG 24 hr tablet TAKE 1 TABLET BY MOUTH ONCE DAILY  30 tablet  4  . glucose blood (ACCU-CHEK COMPACT STRIPS) test strip Use as instructed to check blood sugar eight times a day.  204 each  1  . insulin glargine (LANTUS) 100 UNIT/ML injection Inject 0.6 mLs (60 Units total) into the skin every morning. Lantus Solostar  10 mL  5  . Insulin Pen Needle (RELION PEN NEEDLE 31G/8MM) 31G X 8 MM MISC Use as Directed daily to check blood sugars  100 each  3  . Insulin Syringe-Needle U-100 30G 1 ML MISC USE AS DIRECTED TO INJECT INSULIN DX: 250.00  100 each  3  . metFORMIN (GLUCOPHAGE) 1000 MG tablet TAKE 1 TABLET (1,000 MG) BY MOUTH 2 TIMES DAILY WITH A MEAL.  60 tablet  2  . omeprazole (PRILOSEC) 20 MG capsule TAKE 1 CAPSULE BY MOUTH DAILY.  30 capsule  2  . ONE TOUCH LANCETS MISC Use as directed  200 each  11  . ramipril (ALTACE) 10 MG  capsule TAKE 2 CAPSULES BY MOUTH DAILY  60 capsule  3  . sertraline (ZOLOFT) 50 MG tablet TAKE 1&1/2 TABLETS (75 MG TOTAL) BY MOUTH DAILY.  45 tablet  4  . simvastatin (ZOCOR) 40 MG tablet TAKE 1 TABLET BY MOUTH AT BEDTIME  30 tablet  2   No current facility-administered medications on file prior to visit.    No Known Allergies  Family History  Problem Relation Age of Onset  . Diabetes Mother   . Thyroid disease Mother     Questionable  . Heart disease Father     deceased    History   Social History  . Marital Status: Married    Spouse Name: N/A    Number of Children: N/A  . Years of Education: N/A   Social History Main Topics  . Smoking status: Never Smoker   . Smokeless tobacco: Never Used  . Alcohol Use: None  . Drug Use: None  . Sexual Activity: None   Other Topics Concern  . 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  All other ROS are negative.  BP 118/76  Pulse 76  Temp(Src) 98.1 F (36.7 C) (Oral)  Resp 16  Ht 6' (1.829 m)  Wt 216 lb 4 oz (98.09 kg)  BMI 29.32 kg/m2  SpO2 97%  Physical Exam  Vitals reviewed. Constitutional: He is oriented to person, place, and time and well-developed, well-nourished, and in no distress.  HENT:  Head: Normocephalic and atraumatic.  Neurological: He is alert and oriented to person, place, and time.  Skin: Skin is warm and dry.  Psychiatric: Affect normal.    Recent Results (from the past 2160 hour(s))  TESTOSTERONE, FREE, TOTAL     Status: Abnormal   Collection Time    07/30/13 11:00 AM      Result Value Ref Range   Testosterone 123 (*) 300 - 890 ng/dL   Comment:            Tanner Stage       Male              Male                   I              < 30 ng/dL        < 10 ng/dL                   II             < 150 ng/dL       < 30 ng/dL                   III            100-320 ng/dL     < 35 ng/dL                   IV             200-970 ng/dL     15-40 ng/dL                   V/Adult        300-890 ng/dL     10-70 ng/dL         Sex Hormone Binding 13  13 - 71 nmol/L   Testosterone, Free 35.1 (*) 47.0 - 244.0 pg/mL   Comment:       The concentration of free testosterone is derived from a mathematical     expression based on constants for the binding of testosterone to sex     hormone-binding globulin and albumin.   Testosterone-% Free 2.9  1.6 - 2.9 %  PSA     Status: None   Collection Time    08/04/13 10:39 AM      Result Value Ref Range   PSA 0.46  <=4.00 ng/mL   Comment: Test Methodology: ECLIA PSA (Electrochemiluminescence Immunoassay)           For PSA values from 2.5-4.0, particularly in younger men <60 years     old, the AUA and NCCN suggest testing for % Free  PSA (3515) and     evaluation of the rate of increase in PSA (PSA velocity).    Assessment/Plan: Testosterone deficiency Patient able to demonstrate proficiency with injection self administration in office.  Supplies sent to pharmacy.  Will continue same regimen.  Follow-up in 1 month with repeat testosterone labs.

## 2013-08-30 NOTE — Telephone Encounter (Signed)
Per provider VO, Ok to send refill request to pharmacy/SLS

## 2013-08-30 NOTE — Addendum Note (Signed)
Addended by: Rockwell Germany on: 08/30/2013 02:53 PM   Modules accepted: Orders

## 2013-08-30 NOTE — Assessment & Plan Note (Signed)
Patient able to demonstrate proficiency with injection self administration in office.  Supplies sent to pharmacy.  Will continue same regimen.  Follow-up in 1 month with repeat testosterone labs.

## 2013-08-30 NOTE — Patient Instructions (Signed)
Inject testosterone as directed every 2 weeks.  Follow-up in 1 month for repeat testosterone levels.  Continue other medications as directed.  Continue diet and exercise.  I will call you with your lab results.

## 2013-09-01 ENCOUNTER — Other Ambulatory Visit: Payer: Self-pay | Admitting: *Deleted

## 2013-09-01 ENCOUNTER — Telehealth: Payer: Self-pay | Admitting: Physician Assistant

## 2013-09-01 DIAGNOSIS — E291 Testicular hypofunction: Secondary | ICD-10-CM

## 2013-09-01 MED ORDER — TESTOSTERONE CYPIONATE 200 MG/ML IM SOLN: 200.0000 mg | INTRAMUSCULAR | Status: DC

## 2013-09-01 NOTE — Telephone Encounter (Signed)
Xanax  1mg  1 to 2 by mouth at bedtime  Qty 60  Viagra 100mg  he gets 4 or 5 at a time Testosterone was upped to a 36mil vial  [employer] will only cover a one months supply so it has to go back to  2 65mil bottles.   Med center [facility]

## 2013-09-01 NOTE — Telephone Encounter (Signed)
Patient has Hx of walking into Office unannounced [known back to Dr. Dorthula Rue

## 2013-09-03 ENCOUNTER — Other Ambulatory Visit: Payer: Self-pay | Admitting: *Deleted

## 2013-09-03 MED ORDER — ALPRAZOLAM 1 MG PO TABS: ORAL_TABLET | ORAL | Status: DC

## 2013-09-03 NOTE — Progress Notes (Signed)
Patient requested refill on Alprazolam, updated last Rx after verifying with pharmacy that Rx was phoned in & filled on 07.01.15/SLS Rx request faxed to pharmacy/SLS

## 2013-09-03 NOTE — Telephone Encounter (Signed)
William [name], CMA at 09/03/2013 8:39 AM     Status: Incomplete        Patient requested refill on Alprazolam, updated last Rx after verifying with pharmacy that Rx was phoned in & filled on 07.01.15/SLS   Rx request faxed to pharmacy/SLS

## 2013-09-03 NOTE — Telephone Encounter (Signed)
Pharmacy is calling checking on status

## 2013-09-03 NOTE — Addendum Note (Signed)
Addended by: Rockwell Germany on: 09/03/2013 08:22 AM   Modules accepted: Orders

## 2013-09-03 NOTE — Telephone Encounter (Signed)
Updated with Rx reorder after verifying with pharmacy that request was received & filled on 07.01.15/SLS

## 2013-09-03 NOTE — Telephone Encounter (Signed)
Please call pharmacy to see what they are checking the status of.  Patient was given medications the other day.  It must be the Xanax, although patient stated he did not need the other day.  Must have been confused.

## 2013-09-03 NOTE — Addendum Note (Signed)
Addended by: Rockwell Germany on: 09/03/2013 08:25 AM   Modules accepted: Orders

## 2013-09-06 ENCOUNTER — Ambulatory Visit: Payer: Self-pay | Admitting: Physician Assistant

## 2013-09-20 ENCOUNTER — Encounter: Payer: Self-pay | Admitting: Physician Assistant

## 2013-09-21 ENCOUNTER — Telehealth: Payer: Self-pay | Admitting: Physician Assistant

## 2013-09-21 ENCOUNTER — Other Ambulatory Visit: Payer: Self-pay

## 2013-09-21 DIAGNOSIS — E119 Type 2 diabetes mellitus without complications: Secondary | ICD-10-CM

## 2013-09-21 DIAGNOSIS — E291 Testicular hypofunction: Secondary | ICD-10-CM

## 2013-09-21 MED ORDER — GLUCOSE BLOOD VI STRP: ORAL_STRIP | Status: DC

## 2013-09-21 NOTE — Telephone Encounter (Signed)
Refill- accu chek compact strips  [facility]

## 2013-09-21 NOTE — Telephone Encounter (Signed)
Strips refilled.  Order placed for testosterone.  Patient to come to lab the 1st week of September.

## 2013-10-04 ENCOUNTER — Other Ambulatory Visit: Payer: Self-pay | Admitting: Physician Assistant

## 2013-10-12 ENCOUNTER — Other Ambulatory Visit: Payer: Self-pay | Admitting: Physician Assistant

## 2013-10-12 ENCOUNTER — Other Ambulatory Visit (INDEPENDENT_AMBULATORY_CARE_PROVIDER_SITE_OTHER): Payer: BC Managed Care – PPO

## 2013-10-12 DIAGNOSIS — E291 Testicular hypofunction: Secondary | ICD-10-CM

## 2013-10-12 NOTE — Addendum Note (Signed)
Addended by: Harl Bowie on: 10/12/2013 10:55 AM   Modules accepted: Orders

## 2013-10-13 LAB — TESTOSTERONE, FREE, TOTAL, SHBG
Sex Hormone Binding: 12 nmol/L — ABNORMAL LOW (ref 13–71)
TESTOSTERONE FREE: 53.6 pg/mL (ref 47.0–244.0)
TESTOSTERONE-% FREE: 3 % — AB (ref 1.6–2.9)
Testosterone: 180 ng/dL — ABNORMAL LOW (ref 300–890)

## 2013-10-14 ENCOUNTER — Encounter: Payer: Self-pay | Admitting: Physician Assistant

## 2013-10-18 ENCOUNTER — Encounter: Payer: Self-pay | Admitting: Physician Assistant

## 2013-10-18 ENCOUNTER — Ambulatory Visit (INDEPENDENT_AMBULATORY_CARE_PROVIDER_SITE_OTHER): Payer: BC Managed Care – PPO | Admitting: Physician Assistant

## 2013-10-18 ENCOUNTER — Telehealth: Payer: Self-pay | Admitting: *Deleted

## 2013-10-18 VITALS — BP 130/81 | HR 88 | Temp 98.0°F | Resp 16 | Ht 72.0 in | Wt 215.1 lb

## 2013-10-18 DIAGNOSIS — Z23 Encounter for immunization: Secondary | ICD-10-CM

## 2013-10-18 DIAGNOSIS — E291 Testicular hypofunction: Secondary | ICD-10-CM

## 2013-10-18 NOTE — Progress Notes (Signed)
Patient presents to clinic today to discuss further management regarding his hypogonadism.  Patient is currently on Depo-Testosterone injections -- 200 mg every 2 weeks.  Testosterone has risen from 123 to 180 in 2 months. Patient endorses some improvement in symptoms.  Patient is wondering if higher dose of medication can be given.  Past Medical History  Diagnosis Date  . Asthma     history  . Arthritis   . History of chicken pox   . Adjustment disorder with mixed anxiety and depressed mood   . Diabetes mellitus     type 2  . Elevated blood pressure     history of high blood pressure readings  . Hypercholesteremia     Current Outpatient Prescriptions on File Prior to Visit  Medication Sig Dispense Refill  . ALPRAZolam (XANAX) 1 MG tablet TAKE 1-2 TABLETS BY MOUTH DAILY AT BEDTIME AS NEEDED FOR SLEEP AND ANXIETY.  60 tablet  0  . aspirin 81 MG tablet Take 81 mg by mouth daily.      . Blood Glucose Monitoring Suppl (ONE TOUCH ULTRA SYSTEM KIT) W/DEVICE KIT 1 kit by Does not apply route once. Use to check blood sugar four times daily       . buPROPion (WELLBUTRIN XL) 150 MG 24 hr tablet TAKE 1 TABLET BY MOUTH ONCE DAILY  30 tablet  4  . glucose blood (ACCU-CHEK COMPACT STRIPS) test strip Use as instructed to check blood sugar eight times a day.  300 each  2  . insulin glargine (LANTUS) 100 UNIT/ML injection Inject 0.6 mLs (60 Units total) into the skin every morning. Lantus Solostar  10 mL  5  . Insulin Pen Needle (RELION PEN NEEDLE 31G/8MM) 31G X 8 MM MISC Use as Directed daily to check blood sugars  100 each  3  . Insulin Syringe-Needle U-100 30G 1 ML MISC USE AS DIRECTED TO INJECT INSULIN DX: 250.00  100 each  3  . metFORMIN (GLUCOPHAGE) 1000 MG tablet TAKE 1 TABLET (1,000 MG) BY MOUTH 2 TIMES DAILY WITH A MEAL.  60 tablet  2  . NEEDLE, DISP, 21 G (BD SAFETYGLIDE NEEDLE) 21G X 1" MISC Use Every 14 Days to Draw Testosterone Cypionate  50 each  0  . omeprazole (PRILOSEC) 20 MG capsule TAKE  1 CAPSULE BY MOUTH DAILY.  30 capsule  2  . ONE TOUCH LANCETS MISC Use as directed  200 each  11  . ramipril (ALTACE) 10 MG capsule TAKE 2 CAPSULES BY MOUTH DAILY  60 capsule  3  . sertraline (ZOLOFT) 50 MG tablet TAKE 1&1/2 TABLETS (75 MG TOTAL) BY MOUTH DAILY.  45 tablet  4  . simvastatin (ZOCOR) 40 MG tablet TAKE 1 TABLET BY MOUTH AT BEDTIME  30 tablet  2  . SYRINGE-NEEDLE, DISP, 3 ML (SAFETY-LOK 3CC SYR 23GX1") 23G X 1" 3 ML MISC Use Every 14 Days to Administer Testosterone Cypionate Injection  50 each  0  . testosterone cypionate (DEPOTESTOTERONE CYPIONATE) 200 MG/ML injection Inject 1 mL (200 mg total) into the muscle every 14 (fourteen) days.  10 mL  0  . VIAGRA 100 MG tablet TAKE 1/2 TO 1 TABLET (50-100 MG TOTAL) BY MOUTH DAILY AS NEEDED FOR ERECTILE DYSFUNCTION.  4 tablet  0   No current facility-administered medications on file prior to visit.    No Known Allergies  Family History  Problem Relation Age of Onset  . Diabetes Mother   . Thyroid disease Mother  Questionable  . Heart disease Father     deceased    History   Social History  . Marital Status: Married    Spouse Name: N/A    Number of Children: N/A  . Years of Education: N/A   Social History Main Topics  . Smoking status: Never Smoker   . Smokeless tobacco: Never Used  . Alcohol Use: None  . Drug Use: None  . Sexual Activity: None   Other Topics Concern  . 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  All other ROS are negative.  BP 130/81  Pulse 88  Temp(Src) 98 F (36.7 C) (Oral)  Resp 16  Ht 6' (1.829 m)  Wt 215 lb 2 oz (97.58 kg)  BMI 29.17 kg/m2  SpO2  97%  Physical Exam  Vitals reviewed. Constitutional: He is oriented to person, place, and time and well-developed, well-nourished, and in no distress.  HENT:  Head: Normocephalic and atraumatic.  Eyes: Conjunctivae are normal.  Neck: Neck supple.  Cardiovascular: Normal rate, regular rhythm, normal heart sounds and intact distal pulses.   Pulmonary/Chest: Effort normal and breath sounds normal. No respiratory distress. He has no wheezes. He has no rales. He exhibits no tenderness.  Neurological: He is alert and oriented to person, place, and time.  Skin: Skin is warm and dry. No rash noted.  Psychiatric: Affect normal.    Recent Results (from the past 2160 hour(s))  TESTOSTERONE, FREE, TOTAL     Status: Abnormal   Collection Time    07/30/13 11:00 AM      Result Value Ref Range   Testosterone 123 (*) 300 - 890 ng/dL   Comment:           Tanner Stage       Male              Male                   I              < 30 ng/dL        < 10 ng/dL                   II             < 150 ng/dL       < 30 ng/dL                   III            100-320 ng/dL     < 35 ng/dL                   IV             200-970 ng/dL     15-40 ng/dL                   V/Adult        300-890 ng/dL  10-70 ng/dL         Sex Hormone Binding 13  13 - 71 nmol/L   Testosterone, Free 35.1 (*) 47.0 - 244.0 pg/mL   Comment:       The concentration of free testosterone is derived from a mathematical     expression based on constants for the binding of testosterone to sex     hormone-binding globulin and albumin.   Testosterone-% Free 2.9  1.6 - 2.9 %  PSA     Status: None   Collection Time    08/04/13 10:39 AM      Result Value Ref Range   PSA 0.46  <=4.00 ng/mL   Comment: Test Methodology: ECLIA PSA (Electrochemiluminescence Immunoassay)           For PSA values from 2.5-4.0, particularly in younger men <60 years     old, the AUA and NCCN suggest testing for % Free PSA (3515) and     evaluation of the  rate of increase in PSA (PSA velocity).  HEMOGLOBIN A1C     Status: None   Collection Time    08/30/13 10:01 AM      Result Value Ref Range   Hemoglobin A1C 6.4  4.6 - 6.5 %   Comment: Glycemic Control Guidelines for People with Diabetes:Non Diabetic:  <6%Goal of Therapy: <7%Additional Action Suggested:  >8%   LIPID PANEL     Status: Abnormal   Collection Time    08/30/13 10:01 AM      Result Value Ref Range   Cholesterol 143  0 - 200 mg/dL   Comment: ATP III Classification       Desirable:  < 200 mg/dL               Borderline High:  200 - 239 mg/dL          High:  > = 240 mg/dL   Triglycerides 131.0  0.0 - 149.0 mg/dL   Comment: Normal:  <150 mg/dLBorderline High:  150 - 199 mg/dL   HDL 32.00 (*) >39.00 mg/dL   VLDL 26.2  0.0 - 40.0 mg/dL   LDL Cholesterol 85  0 - 99 mg/dL   Total CHOL/HDL Ratio 4     Comment:                Men          Women1/2 Average Risk     3.4          3.3Average Risk          5.0          4.42X Average Risk          9.6          7.13X Average Risk          15.0          11.0                       NonHDL 111.00     Comment: NOTE:  Non-HDL goal should be 30 mg/dL higher than patient's LDL goal (i.e. LDL goal of < 70 mg/dL, would have non-HDL goal of < 100 mg/dL)  TESTOSTERONE, FREE, TOTAL     Status: Abnormal   Collection Time    10/12/13 10:55 AM      Result Value Ref Range   Testosterone 180 (*) 300 - 890 ng/dL   Comment:  Tanner Stage       Male              Male                   I              < 30 ng/dL        < 10 ng/dL                   II             < 150 ng/dL       < 30 ng/dL                   III            100-320 ng/dL     < 35 ng/dL                   IV             200-970 ng/dL     15-40 ng/dL                   V/Adult        300-890 ng/dL     10-70 ng/dL         Sex Hormone Binding 12 (*) 13 - 71 nmol/L   Testosterone, Free 53.6  47.0 - 244.0 pg/mL   Comment:       The concentration of free testosterone is derived from a  mathematical     expression based on constants for the binding of testosterone to sex     hormone-binding globulin and albumin.   Testosterone-% Free 3.0 (*) 1.6 - 2.9 %   Assessment/Plan: Primary male hypogonadism Good response to Depo-testosterone at current dose and frequency.  Discussed risk of raising testosterone too rapidly or to too high of a level. Do not feel larger dose is warranted at present giving response to therapy.  Will continue current regimen for now.  Need for prophylactic vaccination and inoculation against influenza Flu shot given by nursing staff.

## 2013-10-18 NOTE — Telephone Encounter (Signed)
Patient did show for appointment and was seen in clinic.

## 2013-10-18 NOTE — Telephone Encounter (Signed)
Pt did not show for appointment 10/18/2013 at 3:00 to discuss testosterone

## 2013-10-18 NOTE — Progress Notes (Signed)
Pre visit review using our clinic review tool, if applicable. No additional management support is needed unless otherwise documented below in the visit note/SLS  

## 2013-10-20 DIAGNOSIS — Z23 Encounter for immunization: Secondary | ICD-10-CM | POA: Insufficient documentation

## 2013-10-21 ENCOUNTER — Encounter: Payer: Self-pay | Admitting: Physician Assistant

## 2013-10-21 NOTE — Assessment & Plan Note (Signed)
Good response to Depo-testosterone at current dose and frequency.  Discussed risk of raising testosterone too rapidly or to too high of a level. Do not feel larger dose is warranted at present giving response to therapy.  Will continue current regimen for now.

## 2013-10-21 NOTE — Assessment & Plan Note (Signed)
Flu shot given by nursing staff. 

## 2013-10-22 ENCOUNTER — Encounter: Payer: Self-pay | Admitting: Physician Assistant

## 2013-10-28 ENCOUNTER — Ambulatory Visit (INDEPENDENT_AMBULATORY_CARE_PROVIDER_SITE_OTHER): Payer: BC Managed Care – PPO

## 2013-10-28 DIAGNOSIS — Z23 Encounter for immunization: Secondary | ICD-10-CM

## 2013-10-28 DIAGNOSIS — Z2911 Encounter for prophylactic immunotherapy for respiratory syncytial virus (RSV): Secondary | ICD-10-CM

## 2013-10-28 MED ORDER — ZOSTER VACCINE LIVE 19400 UNT/0.65ML ~~LOC~~ SOLR: 0.6500 mL | Freq: Once | SUBCUTANEOUS | Status: DC

## 2013-10-28 NOTE — Progress Notes (Signed)
Pre visit review using our clinic review tool, if applicable. No additional management support is needed unless otherwise documented below in the visit note. 

## 2013-10-28 NOTE — Progress Notes (Signed)
Pt tolerated injection well.  No reaction noted upon leaving the clinic.

## 2013-11-03 ENCOUNTER — Other Ambulatory Visit: Payer: Self-pay | Admitting: Physician Assistant

## 2013-11-04 ENCOUNTER — Other Ambulatory Visit: Payer: Self-pay | Admitting: Physician Assistant

## 2013-11-30 ENCOUNTER — Other Ambulatory Visit: Payer: Self-pay | Admitting: Physician Assistant

## 2013-11-30 DIAGNOSIS — E291 Testicular hypofunction: Secondary | ICD-10-CM

## 2013-11-30 NOTE — Telephone Encounter (Signed)
Rx sent to the pharmacy by e-script.//AB/CMA 

## 2013-12-01 MED ORDER — SILDENAFIL CITRATE 100 MG PO TABS: ORAL_TABLET | ORAL | Status: DC

## 2013-12-01 MED ORDER — SERTRALINE HCL 50 MG PO TABS: ORAL_TABLET | ORAL | Status: DC

## 2013-12-01 MED ORDER — ALPRAZOLAM 1 MG PO TABS: ORAL_TABLET | ORAL | Status: DC

## 2013-12-01 MED ORDER — SYRINGE/NEEDLE (DISP) 23G X 1" 3 ML MISC
Status: DC
Start: 2013-12-01 — End: 2014-07-04

## 2013-12-01 MED ORDER — TESTOSTERONE CYPIONATE 200 MG/ML IM SOLN: 200.0000 mg | INTRAMUSCULAR | Status: DC

## 2013-12-01 NOTE — Telephone Encounter (Signed)
Last seen 10/18/13 and filled 09/03/13 #60 No UDS and No contract.   Please advise     KP

## 2013-12-07 ENCOUNTER — Ambulatory Visit (INDEPENDENT_AMBULATORY_CARE_PROVIDER_SITE_OTHER): Payer: BC Managed Care – PPO | Admitting: Physician Assistant

## 2013-12-07 ENCOUNTER — Encounter: Payer: Self-pay | Admitting: Physician Assistant

## 2013-12-07 VITALS — BP 119/66 | HR 90 | Temp 98.0°F | Resp 16 | Ht 72.0 in | Wt 213.1 lb

## 2013-12-07 DIAGNOSIS — H1132 Conjunctival hemorrhage, left eye: Secondary | ICD-10-CM | POA: Insufficient documentation

## 2013-12-07 DIAGNOSIS — K640 First degree hemorrhoids: Secondary | ICD-10-CM

## 2013-12-07 DIAGNOSIS — K649 Unspecified hemorrhoids: Secondary | ICD-10-CM | POA: Insufficient documentation

## 2013-12-07 MED ORDER — HYDROCORTISONE ACETATE 25 MG RE SUPP: 25.0000 mg | Freq: Two times a day (BID) | RECTAL | Status: DC

## 2013-12-07 NOTE — Progress Notes (Signed)
Patient presents to clinic today with multiple complaints.  Patient c/o red area underneath left iris x 2 days.  Denies trauma or injury.  Does endorse some forceful sneezing a few days ago.  BP well controlled previously with current regimen.  BP normotensive in clinic today.  Patient denies pain or change in vision.  Patient c/o noting bright red blood on toilet paper this morning after a large bowel movement.  Denies hard stool or straining.  Denies tenesmus or melena.  Denies known history of hemorrhoid or fissure.  Past Medical History  Diagnosis Date  . Asthma     history  . Arthritis   . History of chicken pox   . Adjustment disorder with mixed anxiety and depressed mood   . Diabetes mellitus     type 2  . Elevated blood pressure     history of high blood pressure readings  . Hypercholesteremia     Current Outpatient Prescriptions on File Prior to Visit  Medication Sig Dispense Refill  . ALPRAZolam (XANAX) 1 MG tablet TAKE 1-2 TABLETS BY MOUTH DAILY AT BEDTIME AS NEEDED FOR SLEEP AND ANXIETY. 60 tablet 0  . aspirin 81 MG tablet Take 81 mg by mouth daily.    . Blood Glucose Monitoring Suppl (ONE TOUCH ULTRA SYSTEM KIT) W/DEVICE KIT 1 kit by Does not apply route once. Use to check blood sugar four times daily     . buPROPion (WELLBUTRIN XL) 150 MG 24 hr tablet TAKE 1 TABLET BY MOUTH ONCE DAILY 30 tablet 4  . glucose blood (ACCU-CHEK COMPACT STRIPS) test strip Use as instructed to check blood sugar eight times a day. 300 each 2  . insulin glargine (LANTUS) 100 UNIT/ML injection Inject 0.6 mLs (60 Units total) into the skin every morning. Lantus Solostar 10 mL 5  . Insulin Pen Needle (RELION PEN NEEDLE 31G/8MM) 31G X 8 MM MISC Use as Directed daily to check blood sugars 100 each 3  . Insulin Syringe-Needle U-100 30G 1 ML MISC USE AS DIRECTED TO INJECT INSULIN DX: 250.00 100 each 3  . metFORMIN (GLUCOPHAGE) 1000 MG tablet TAKE 1 TABLET (1,000 MG) BY MOUTH 2 TIMES DAILY WITH A  MEAL. 60 tablet 2  . NEEDLE, DISP, 21 G (BD SAFETYGLIDE NEEDLE) 21G X 1" MISC Use Every 14 Days to Draw Testosterone Cypionate 50 each 0  . omeprazole (PRILOSEC) 20 MG capsule TAKE 1 CAPSULE BY MOUTH DAILY. 30 capsule 2  . ONE TOUCH LANCETS MISC Use as directed 200 each 11  . ramipril (ALTACE) 10 MG capsule TAKE 2 CAPSULES BY MOUTH DAILY 60 capsule 3  . sertraline (ZOLOFT) 50 MG tablet TAKE 1&1/2 TABLETS (75 MG TOTAL) BY MOUTH DAILY. 45 tablet 5  . sildenafil (VIAGRA) 100 MG tablet TAKE 1/2 TO 1 TABLET (50-100 MG TOTAL) BY MOUTH DAILY AS NEEDED FOR ERECTILE DYSFUNCTION. 4 tablet 2  . simvastatin (ZOCOR) 40 MG tablet TAKE 1 TABLET BY MOUTH AT BEDTIME 30 tablet 2  . SYRINGE-NEEDLE, DISP, 3 ML (SAFETY-LOK 3CC SYR 23GX1") 23G X 1" 3 ML MISC Use Every 14 Days to Administer Testosterone Cypionate Injection 50 each 5  . testosterone cypionate (DEPOTESTOTERONE CYPIONATE) 200 MG/ML injection Inject 1 mL (200 mg total) into the muscle every 14 (fourteen) days. 10 mL 0   No current facility-administered medications on file prior to visit.    No Known Allergies  Family History  Problem Relation Age of Onset  . Diabetes Mother   . Thyroid disease Mother  Questionable  . Heart disease Father     deceased    History   Social History  . Marital Status: Married    Spouse Name: N/A    Number of Children: N/A  . Years of Education: N/A   Social History Main Topics  . Smoking status: Never Smoker   . Smokeless tobacco: Never Used  . Alcohol Use: None  . Drug Use: None  . Sexual Activity: None   Other Topics Concern  . 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  All other ROS are negative.  BP 119/66 mmHg  Pulse 90  Temp(Src) 98 F (36.7 C) (Oral)  Resp 16  Ht 6' (1.829 m)  Wt 213 lb 2 oz (96.673 kg)  BMI 28.90 kg/m2  SpO2 98%  Physical Exam  Constitutional: He is oriented to person, place, and time and well-developed, well-nourished, and in no distress.  HENT:  Head: Normocephalic and atraumatic.  Right Ear: Tympanic membrane, external ear and ear canal normal.  Left Ear: Tympanic membrane, external ear and ear canal normal.  Nose: Nose normal.  Mouth/Throat: Uvula is midline, oropharynx is clear and moist and mucous membranes are normal.  Eyes: EOM are normal. Pupils are equal, round, and reactive to light.    Neck: Neck supple.  Cardiovascular: Normal rate, regular rhythm, normal heart sounds and intact distal pulses.   Pulmonary/Chest: Effort normal and breath sounds normal. No respiratory distress. He has no wheezes. He has no rales. He exhibits no tenderness.  Genitourinary: Rectal exam shows external hemorrhoid and internal hemorrhoid. Rectal exam shows no fissure.  Neurological: He is alert and oriented to person, place, and time.  Skin: Skin is warm and dry. No rash noted.  Psychiatric: Affect normal.  Vitals reviewed.  Recent Results (from the past 2160 hour(s))  Testosterone, free, total     Status: Abnormal   Collection Time: 10/12/13 10:55 AM  Result Value Ref Range   Testosterone 180 (L) 300 - 890 ng/dL    Comment:           Tanner Stage       Male              Male               I              < 30 ng/dL        < 10 ng/dL               II             < 150 ng/dL       < 30 ng/dL               III            100-320 ng/dL     < 35 ng/dL               IV  200-970 ng/dL     15-40 ng/dL               V/Adult        300-890 ng/dL     10-70 ng/dL     Sex Hormone Binding 12 (L) 13 - 71 nmol/L   Testosterone, Free 53.6 47.0 - 244.0 pg/mL    Comment:   The concentration of free testosterone is  derived from a mathematical expression based on constants for the binding of testosterone to sex hormone-binding globulin and albumin.   Testosterone-% Free 3.0 (H) 1.6 - 2.9 %    Assessment/Plan: Subconjunctival hemorrhage of left eye Discussed prognosis with patient.  BP well controlled.  No other treatment needed at present.  First degree hemorrhoids Rx Anucort HC suppositories.  Supportive measures discussed with patient.  Will start increase fluids and stool softener. Follow-up PRN.

## 2013-12-07 NOTE — Assessment & Plan Note (Signed)
Discussed prognosis with patient.  BP well controlled.  No other treatment needed at present.

## 2013-12-07 NOTE — Progress Notes (Signed)
Pre visit review using our clinic review tool, if applicable. No additional management support is needed unless otherwise documented below in the visit note/SLS  

## 2013-12-07 NOTE — Assessment & Plan Note (Signed)
Rx Anucort HC suppositories.  Supportive measures discussed with patient.  Will start increase fluids and stool softener. Follow-up PRN.

## 2013-12-07 NOTE — Patient Instructions (Signed)
Please use suppositories as directed.  Increase your fluid intake.  Take a fiber supplement and stool softener.  Follow-up in 2 weeks if symptoms still persist.  Read information below on hemorrhoids and subconjunctival hemorrhoids.  Continue other medications as directed.  We will recheck labs in December.  Hemorrhoids Hemorrhoids are puffy (swollen) veins around the rectum or anus. Hemorrhoids can cause pain, itching, bleeding, or irritation. HOME CARE  Eat foods with fiber, such as whole grains, beans, nuts, fruits, and vegetables. Ask your doctor about taking products with added fiber in them (fibersupplements).  Drink enough fluid to keep your pee (urine) clear or pale yellow.  Exercise often.  Go to the bathroom when you have the urge to poop. Do not wait.  Avoid straining to poop (bowel movement).  Keep the butt area dry and clean. Use wet toilet paper or moist paper towels.  Medicated creams and medicine inserted into the anus (anal suppository) may be used or applied as told.  Only take medicine as told by your doctor.  Take a warm water bath (sitz bath) for 15-20 minutes to ease pain. Do this 3-4 times a day.  Place ice packs on the area if it is tender or puffy. Use the ice packs between the warm water baths.  Put ice in a plastic bag.  Place a towel between your skin and the bag.  Leave the ice on for 15-20 minutes, 03-04 times a day.  Do not use a donut-shaped pillow or sit on the toilet for a long time. GET HELP RIGHT AWAY IF:   You have more pain that is not controlled by treatment or medicine.  You have bleeding that will not stop.  You have trouble or are unable to poop (bowel movement).  You have pain or puffiness outside the area of the hemorrhoids. MAKE SURE YOU:   Understand these instructions.  Will watch your condition.  Will get help right away if you are not doing well or get worse. Document Released: 10/31/2007 Document Revised:  01/08/2012 Document Reviewed: 12/03/2011 [facility] Patient Information 2015 [place], [place]. This information is not intended to replace advice given to you by your health care provider. Make sure you discuss any questions you have with your health care provider.  Subconjunctival Hemorrhage A subconjunctival hemorrhage is a bright red patch covering a portion of the white of the eye. The white part of the eye is called the sclera, and it is covered by a thin membrane called the conjunctiva. This membrane is clear, except for tiny blood vessels that you can see with the naked eye. When your eye is irritated or inflamed and becomes red, it is because the vessels in the conjunctiva are swollen. Sometimes, a blood vessel in the conjunctiva can break and bleed. When this occurs, the blood builds up between the conjunctiva and the sclera, and spreads out to create a red area. The red spot may be very small at first. It may then spread to cover a larger part of the surface of the eye, or even all of the visible white part of the eye. In almost all cases, the blood will go away and the eye will become white again. Before completely dissolving, however, the red area may spread. It may also become brownish-yellow in color before going away. If a lot of blood collects under the conjunctiva, it may look like a bulge on the surface of the eye. This looks scary, but it will also eventually flatten out and  go away. Subconjunctival hemorrhages do not cause pain, but if swollen, may cause a feeling of irritation. There is no effect on vision.  CAUSES   The most common cause is mild trauma (rubbing the eye, irritation).  Subconjunctival hemorrhages can happen because of coughing or straining (lifting heavy objects), vomiting, or sneezing.  In some cases, your doctor may want to check your blood pressure. High blood pressure can also cause a subconjunctival hemorrhage.  Severe trauma or blunt injuries.  Diseases that  affect blood clotting (hemophilia, leukemia).  Abnormalities of blood vessels behind the eye (carotid cavernous sinus fistula).  Tumors behind the eye.  Certain drugs (aspirin, Coumadin, heparin).  Recent eye surgery. HOME CARE INSTRUCTIONS   Do not worry about the appearance of your eye. You may continue your usual activities.  Often, follow-up is not necessary. SEEK MEDICAL CARE IF:   Your eye becomes painful.  The bleeding does not disappear within 3 weeks.  Bleeding occurs elsewhere, for example, under the skin, in the mouth, or in the other eye.  You have recurring subconjunctival hemorrhages. SEEK IMMEDIATE MEDICAL CARE IF:   Your vision changes or you have difficulty seeing.  You develop a severe headache, persistent vomiting, confusion, or abnormal drowsiness (lethargy).  Your eye seems to bulge or protrude from the eye socket.  You notice the sudden appearance of bruises or have spontaneous bleeding elsewhere on your body. Document Released: 01/21/2005 Document Revised: 06/07/2013 Document Reviewed: 12/19/2008 [facility] Patient Information 2015 [place], [place]. This information is not intended to replace advice given to you by your health care provider. Make sure you discuss any questions you have with your health care provider.

## 2013-12-08 ENCOUNTER — Encounter: Payer: Self-pay | Admitting: Physician Assistant

## 2013-12-24 ENCOUNTER — Encounter: Payer: Self-pay | Admitting: Physician Assistant

## 2013-12-24 ENCOUNTER — Other Ambulatory Visit: Payer: Self-pay | Admitting: Physician Assistant

## 2013-12-24 DIAGNOSIS — E349 Endocrine disorder, unspecified: Secondary | ICD-10-CM

## 2013-12-24 DIAGNOSIS — E119 Type 2 diabetes mellitus without complications: Secondary | ICD-10-CM

## 2013-12-24 DIAGNOSIS — E785 Hyperlipidemia, unspecified: Secondary | ICD-10-CM

## 2013-12-24 DIAGNOSIS — I10 Essential (primary) hypertension: Secondary | ICD-10-CM

## 2013-12-24 NOTE — Telephone Encounter (Signed)
Ok to order BMP, AIC, Testosterone and Lipid Panel.  Patient needs to call and schedule lab appointment.

## 2013-12-28 ENCOUNTER — Other Ambulatory Visit: Payer: Self-pay | Admitting: Physician Assistant

## 2013-12-28 ENCOUNTER — Other Ambulatory Visit (INDEPENDENT_AMBULATORY_CARE_PROVIDER_SITE_OTHER): Payer: BC Managed Care – PPO

## 2013-12-28 ENCOUNTER — Telehealth: Payer: Self-pay | Admitting: General Practice

## 2013-12-28 DIAGNOSIS — I10 Essential (primary) hypertension: Secondary | ICD-10-CM

## 2013-12-28 DIAGNOSIS — E119 Type 2 diabetes mellitus without complications: Secondary | ICD-10-CM

## 2013-12-28 DIAGNOSIS — E291 Testicular hypofunction: Secondary | ICD-10-CM

## 2013-12-28 DIAGNOSIS — E349 Endocrine disorder, unspecified: Secondary | ICD-10-CM

## 2013-12-28 DIAGNOSIS — E785 Hyperlipidemia, unspecified: Secondary | ICD-10-CM

## 2013-12-28 LAB — BASIC METABOLIC PANEL
BUN: 19 mg/dL (ref 6–23)
CALCIUM: 9.3 mg/dL (ref 8.4–10.5)
CO2: 25 meq/L (ref 19–32)
CREATININE: 1 mg/dL (ref 0.4–1.5)
Chloride: 101 mEq/L (ref 96–112)
GFR: 80.43 mL/min (ref >60.00)
Glucose, Bld: 226 mg/dL — ABNORMAL HIGH (ref 70–99)
Potassium: 4.8 mEq/L (ref 3.5–5.1)
Sodium: 138 mEq/L (ref 135–145)

## 2013-12-28 LAB — HEMOGLOBIN A1C: HEMOGLOBIN A1C: 7.2 % — AB (ref 4.6–6.5)

## 2013-12-28 LAB — LIPID PANEL
Cholesterol: 179 mg/dL (ref 0–200)
HDL: 31.9 mg/dL — ABNORMAL LOW (ref >39.00)
LDL Cholesterol: 111 mg/dL — ABNORMAL HIGH (ref 0–99)
NonHDL: 147.1
Total CHOL/HDL Ratio: 6
Triglycerides: 183 mg/dL — ABNORMAL HIGH (ref 0.0–149.0)
VLDL: 36.6 mg/dL (ref 0.0–40.0)

## 2013-12-28 LAB — TESTOSTERONE: Testosterone: 214.12 ng/dL — ABNORMAL LOW (ref 300.00–890.00)

## 2013-12-28 MED ORDER — ALPRAZOLAM 1 MG PO TABS: ORAL_TABLET | ORAL | Status: DC

## 2013-12-28 NOTE — Telephone Encounter (Signed)
Refill granted.  Rx printed, signed and ready to be faxed. 

## 2013-12-28 NOTE — Telephone Encounter (Signed)
Last OV 12-07-13 Alprazolam last filled 12/01/13 #60 with 0

## 2013-12-28 NOTE — Telephone Encounter (Signed)
Medication faxed

## 2013-12-29 ENCOUNTER — Telehealth: Payer: Self-pay | Admitting: Physician Assistant

## 2013-12-29 NOTE — Telephone Encounter (Signed)
emmi emailed °

## 2014-01-19 ENCOUNTER — Ambulatory Visit: Payer: BC Managed Care – PPO | Admitting: Physician Assistant

## 2014-02-01 ENCOUNTER — Ambulatory Visit (INDEPENDENT_AMBULATORY_CARE_PROVIDER_SITE_OTHER): Payer: BC Managed Care – PPO | Admitting: Physician Assistant

## 2014-02-01 ENCOUNTER — Other Ambulatory Visit: Payer: Self-pay | Admitting: Physician Assistant

## 2014-02-01 ENCOUNTER — Encounter: Payer: Self-pay | Admitting: Physician Assistant

## 2014-02-01 VITALS — BP 116/66 | HR 79 | Temp 98.5°F | Resp 16 | Ht 72.0 in | Wt 217.0 lb

## 2014-02-01 DIAGNOSIS — E119 Type 2 diabetes mellitus without complications: Secondary | ICD-10-CM

## 2014-02-01 DIAGNOSIS — L729 Follicular cyst of the skin and subcutaneous tissue, unspecified: Secondary | ICD-10-CM

## 2014-02-01 NOTE — Assessment & Plan Note (Signed)
Suspect Sebaceous.  No drainage expressed.  Recommend Excisional Biopsy with Dermatology.

## 2014-02-01 NOTE — Progress Notes (Signed)
Pre visit review using our clinic review tool, if applicable. No additional management support is needed unless otherwise documented below in the visit note/SLS  

## 2014-02-01 NOTE — Progress Notes (Signed)
Patient presents to clinic today to discuss recent A1C results. Last A1C at 7.2 Patient endorses taking medications as directed.  Endorses poor dietary choices and significant stress over the past several months revolving around family matters.  Feels that now that the holidays are over he can resume his diet and exercise regimen.  Patient also continues to complain of a spot on his back, located on the left trapezius region.  Denies pain, swelling or drainage.  Was previously told by this provider that this is most likely a sebaceous cyst and will need excisional biopsy by Dermatology.   Past Medical History  Diagnosis Date  . Asthma     history  . Arthritis   . History of chicken pox   . Adjustment disorder with mixed anxiety and depressed mood   . Diabetes mellitus     type 2  . Elevated blood pressure     history of high blood pressure readings  . Hypercholesteremia     Current Outpatient Prescriptions on File Prior to Visit  Medication Sig Dispense Refill  . ALPRAZolam (XANAX) 1 MG tablet TAKE 1-2 TABLETS BY MOUTH DAILY AT BEDTIME AS NEEDED FOR SLEEP AND ANXIETY. 60 tablet 2  . aspirin 81 MG tablet Take 81 mg by mouth daily.    . Blood Glucose Monitoring Suppl (ONE TOUCH ULTRA SYSTEM KIT) W/DEVICE KIT 1 kit by Does not apply route once. Use to check blood sugar four times daily     . buPROPion (WELLBUTRIN XL) 150 MG 24 hr tablet TAKE 1 TABLET BY MOUTH ONCE DAILY 30 tablet 4  . glucose blood (ACCU-CHEK COMPACT STRIPS) test strip Use as instructed to check blood sugar eight times a day. 300 each 2  . hydrocortisone (ANUSOL-HC) 25 MG suppository Place 1 suppository (25 mg total) rectally 2 (two) times daily. 12 suppository 0  . insulin glargine (LANTUS) 100 UNIT/ML injection Inject 0.6 mLs (60 Units total) into the skin every morning. Lantus Solostar 10 mL 5  . Insulin Pen Needle (RELION PEN NEEDLE 31G/8MM) 31G X 8 MM MISC Use as Directed daily to check blood sugars 100 each 3  .  Insulin Syringe-Needle U-100 30G 1 ML MISC USE AS DIRECTED TO INJECT INSULIN DX: 250.00 100 each 3  . metFORMIN (GLUCOPHAGE) 1000 MG tablet TAKE 1 TABLET (1,000 MG) BY MOUTH 2 TIMES DAILY WITH A MEAL. 60 tablet 2  . NEEDLE, DISP, 21 G (BD SAFETYGLIDE NEEDLE) 21G X 1" MISC Use Every 14 Days to Draw Testosterone Cypionate 50 each 0  . omeprazole (PRILOSEC) 20 MG capsule TAKE 1 CAPSULE BY MOUTH DAILY. 30 capsule 2  . ONE TOUCH LANCETS MISC Use as directed 200 each 11  . ramipril (ALTACE) 10 MG capsule TAKE 2 CAPSULES BY MOUTH DAILY 60 capsule 3  . sertraline (ZOLOFT) 50 MG tablet TAKE 1&1/2 TABLETS (75 MG TOTAL) BY MOUTH DAILY. 45 tablet 5  . sildenafil (VIAGRA) 100 MG tablet TAKE 1/2 TO 1 TABLET (50-100 MG TOTAL) BY MOUTH DAILY AS NEEDED FOR ERECTILE DYSFUNCTION. 4 tablet 2  . simvastatin (ZOCOR) 40 MG tablet TAKE 1 TABLET BY MOUTH AT BEDTIME 30 tablet 2  . SYRINGE-NEEDLE, DISP, 3 ML (SAFETY-LOK 3CC SYR 23GX1") 23G X 1" 3 ML MISC Use Every 14 Days to Administer Testosterone Cypionate Injection 50 each 5  . testosterone cypionate (DEPOTESTOTERONE CYPIONATE) 200 MG/ML injection Inject 1 mL (200 mg total) into the muscle every 14 (fourteen) days. 10 mL 0   No current facility-administered medications  on file prior to visit.    No Known Allergies  Family History  Problem Relation Age of Onset  . Diabetes Mother   . Thyroid disease Mother     Questionable  . Heart disease Father     deceased    History   Social History  . Marital Status: Married    Spouse Name: N/A    Number of Children: N/A  . Years of Education: N/A   Social History Main Topics  . Smoking status: Never Smoker   . Smokeless tobacco: Never Used  . Alcohol Use: None  . Drug Use: None  . Sexual Activity: None   Other Topics Concern  . None   Social History Narrative   Occupation: Engineer, site   Married -64 marriage (2nd marriage)   daughter 83,  2 sons (2nd marriage  11,6)   [place]   Never Smoked    Alcohol  use-yes   Drug use-no    Regular exercise-no   Smoking Status:  never   Does Patient Exercise:  no   Caffeine use/day:  3-4 cups coffee daily   Drug Use:  no         Review of Systems - See HPI.  All other ROS are negative.  BP 116/66 mmHg  Pulse 79  Temp(Src) 98.5 F (36.9 C) (Oral)  Resp 16  Ht 6' (1.829 m)  Wt 217 lb (98.431 kg)  BMI 29.42 kg/m2  SpO2 96%  Physical Exam  Constitutional: He is oriented to person, place, and time and well-developed, well-nourished, and in no distress.  Cardiovascular: Normal rate, regular rhythm, normal heart sounds and intact distal pulses.   Pulmonary/Chest: Effort normal and breath sounds normal. No respiratory distress. He has no wheezes. He has no rales. He exhibits no tenderness.  Neurological: He is alert and oriented to person, place, and time.  Skin: Skin is warm and dry.     Vitals reviewed.   Recent Results (from the past 2160 hour(s))  Hemoglobin A1c     Status: Abnormal   Collection Time: 12/28/13 10:38 AM  Result Value Ref Range   Hgb A1c MFr Bld 7.2 (H) 4.6 - 6.5 %    Comment: Glycemic Control Guidelines for People with Diabetes:Non Diabetic:  <6%Goal of Therapy: <7%Additional Action Suggested:  >8%   Basic metabolic panel     Status: Abnormal   Collection Time: 12/28/13 10:38 AM  Result Value Ref Range   Sodium 138 135 - 145 mEq/L   Potassium 4.8 3.5 - 5.1 mEq/L   Chloride 101 96 - 112 mEq/L   CO2 25 19 - 32 mEq/L   Glucose, Bld 226 (H) 70 - 99 mg/dL   BUN 19 6 - 23 mg/dL   Creatinine, Ser 1.0 0.4 - 1.5 mg/dL   Calcium 9.3 8.4 - 10.5 mg/dL   GFR 80.43 >60.00 mL/min  Testosterone     Status: Abnormal   Collection Time: 12/28/13 10:38 AM  Result Value Ref Range   Testosterone 214.12 (L) 300.00 - 890.00 ng/dL  Lipid panel     Status: Abnormal   Collection Time: 12/28/13 10:38 AM  Result Value Ref Range   Cholesterol 179 0 - 200 mg/dL    Comment: ATP III Classification       Desirable:  < 200 mg/dL                Borderline High:  200 - 239 mg/dL  High:  > = 240 mg/dL   Triglycerides 183.0 (H) 0.0 - 149.0 mg/dL    Comment: Normal:  <150 mg/dLBorderline High:  150 - 199 mg/dL   HDL 31.90 (L) >39.00 mg/dL   VLDL 36.6 0.0 - 40.0 mg/dL   LDL Cholesterol 111 (H) 0 - 99 mg/dL   Total CHOL/HDL Ratio 6     Comment:                Men          Women1/2 Average Risk     3.4          3.3Average Risk          5.0          4.42X Average Risk          9.6          7.13X Average Risk          15.0          11.0                       NonHDL 147.10     Comment: NOTE:  Non-HDL goal should be 30 mg/dL higher than patient's LDL goal (i.e. LDL goal of < 70 mg/dL, would have non-HDL goal of < 100 mg/dL)    Assessment/Plan: Cyst of skin Suspect Sebaceous.  No drainage expressed.  Recommend Excisional Biopsy with Dermatology.  Diabetes mellitus Restart TLC in addition to medication. No changes needed presently.  Will continue to monitor.

## 2014-02-01 NOTE — Assessment & Plan Note (Signed)
Restart TLC in addition to medication. No changes needed presently.  Will continue to monitor.

## 2014-02-02 NOTE — Telephone Encounter (Signed)
Medication Detail      Disp Refills Start End     insulin glargine (LANTUS) 100 UNIT/ML injection 10 mL 5 07/09/2013     Sig - Route: Inject 0.6 mLs (60 Units total) into the skin every morning. Lantus Solostar - Subcutaneous    E-Prescribing Status: Receipt confirmed by pharmacy (07/09/2013 4:40 PM EDT)    Pharmacy request is for: 85 units daily, knowing patient self adjust medication, please advise on correct amount of daily units to send Rx for/SLS

## 2014-03-04 ENCOUNTER — Encounter: Payer: Self-pay | Admitting: Physician Assistant

## 2014-03-04 ENCOUNTER — Ambulatory Visit (INDEPENDENT_AMBULATORY_CARE_PROVIDER_SITE_OTHER): Payer: BLUE CROSS/BLUE SHIELD | Admitting: Physician Assistant

## 2014-03-04 VITALS — BP 135/80 | HR 89 | Temp 98.1°F | Resp 16 | Ht 72.0 in | Wt 216.4 lb

## 2014-03-04 DIAGNOSIS — B9689 Other specified bacterial agents as the cause of diseases classified elsewhere: Secondary | ICD-10-CM

## 2014-03-04 DIAGNOSIS — J019 Acute sinusitis, unspecified: Secondary | ICD-10-CM

## 2014-03-04 MED ORDER — AZITHROMYCIN 250 MG PO TABS: ORAL_TABLET | ORAL | Status: DC

## 2014-03-04 NOTE — Patient Instructions (Signed)
Please take antibiotic as directed.  Increase fluid intake.  Use [place] spray.  Take a daily multivitamin. Continue chronic medications as directed.  Place a humidifier in the bedroom.  Please call or return clinic if symptoms are not improving.  Sinusitis Sinusitis is redness, soreness, and swelling (inflammation) of the paranasal sinuses. Paranasal sinuses are air pockets within the bones of your face (beneath the eyes, the middle of the forehead, or above the eyes). In healthy paranasal sinuses, mucus is able to drain out, and air is able to circulate through them by way of your nose. However, when your paranasal sinuses are inflamed, mucus and air can become trapped. This can allow bacteria and other germs to grow and cause infection. Sinusitis can develop quickly and last only a short time (acute) or continue over a long period (chronic). Sinusitis that lasts for more than 12 weeks is considered chronic.  CAUSES  Causes of sinusitis include:  Allergies.  Structural abnormalities, such as displacement of the cartilage that separates your nostrils (deviated septum), which can decrease the air flow through your nose and sinuses and affect sinus drainage.  Functional abnormalities, such as when the small hairs (cilia) that line your sinuses and help remove mucus do not work properly or are not present. SYMPTOMS  Symptoms of acute and chronic sinusitis are the same. The primary symptoms are pain and pressure around the affected sinuses. Other symptoms include:  Upper toothache.  Earache.  Headache.  Bad breath.  Decreased sense of smell and taste.  A cough, which worsens when you are lying flat.  Fatigue.  Fever.  Thick drainage from your nose, which often is green and may contain pus (purulent).  Swelling and warmth over the affected sinuses. DIAGNOSIS  Your caregiver will perform a physical exam. During the exam, your caregiver may:  Look in your nose for signs of abnormal  growths in your nostrils (nasal polyps).  Tap over the affected sinus to check for signs of infection.  View the inside of your sinuses (endoscopy) with a special imaging device with a light attached (endoscope), which is inserted into your sinuses. If your caregiver suspects that you have chronic sinusitis, one or more of the following tests may be recommended:  Allergy tests.  Nasal culture A sample of mucus is taken from your nose and sent to a lab and screened for bacteria.  Nasal cytology A sample of mucus is taken from your nose and examined by your caregiver to determine if your sinusitis is related to an allergy. TREATMENT  Most cases of acute sinusitis are related to a viral infection and will resolve on their own within 10 days. Sometimes medicines are prescribed to help relieve symptoms (pain medicine, decongestants, nasal steroid sprays, or saline sprays).  However, for sinusitis related to a bacterial infection, your caregiver will prescribe antibiotic medicines. These are medicines that will help kill the bacteria causing the infection.  Rarely, sinusitis is caused by a fungal infection. In theses cases, your caregiver will prescribe antifungal medicine. For some cases of chronic sinusitis, surgery is needed. Generally, these are cases in which sinusitis recurs more than 3 times per year, despite other treatments. HOME CARE INSTRUCTIONS   Drink plenty of water. Water helps thin the mucus so your sinuses can drain more easily.  Use a humidifier.  Inhale steam 3 to 4 times a day (for example, sit in the bathroom with the shower running).  Apply a warm, moist washcloth to your face 3 to  4 times a day, or as directed by your caregiver.  Use saline nasal sprays to help moisten and clean your sinuses.  Take over-the-counter or prescription medicines for pain, discomfort, or fever only as directed by your caregiver. SEEK IMMEDIATE MEDICAL CARE IF:  You have increasing pain or  severe headaches.  You have nausea, vomiting, or drowsiness.  You have swelling around your face.  You have vision problems.  You have a stiff neck.  You have difficulty breathing. MAKE SURE YOU:   Understand these instructions.  Will watch your condition.  Will get help right away if you are not doing well or get worse. Document Released: 01/21/2005 Document Revised: 04/15/2011 Document Reviewed: 02/05/2011 [facility] Patient Information 2014 [place], [place].

## 2014-03-04 NOTE — Progress Notes (Signed)
History of Present Illness: William [name] is a 54 y.o. male who present to the clinic today complaining of sinus pressure, sinus pain and nasal congestion. Patient endorses symptoms have been present and worsening over the past week.  Endorses fatigue, PND and ST.  Is having some mild chest congestion with NP cough.  Patient denies fever, chills or sick contact.   History: Past Medical History  Diagnosis Date  . Asthma     history  . Arthritis   . History of chicken pox   . Adjustment disorder with mixed anxiety and depressed mood   . Diabetes mellitus     type 2  . Elevated blood pressure     history of high blood pressure readings  . Hypercholesteremia     Current outpatient prescriptions:  .  ALPRAZolam (XANAX) 1 MG tablet, TAKE 1-2 TABLETS BY MOUTH DAILY AT BEDTIME AS NEEDED FOR SLEEP AND ANXIETY., Disp: 60 tablet, Rfl: 2 .  aspirin 81 MG tablet, Take 81 mg by mouth daily., Disp: , Rfl:  .  Blood Glucose Monitoring Suppl (ONE TOUCH ULTRA SYSTEM KIT) W/DEVICE KIT, 1 kit by Does not apply route once. Use to check blood sugar four times daily , Disp: , Rfl:  .  buPROPion (WELLBUTRIN XL) 150 MG 24 hr tablet, TAKE 1 TABLET BY MOUTH ONCE DAILY, Disp: 30 tablet, Rfl: 4 .  glucose blood (ACCU-CHEK COMPACT STRIPS) test strip, Use as instructed to check blood sugar eight times a day., Disp: 300 each, Rfl: 2 .  hydrocortisone (ANUSOL-HC) 25 MG suppository, Place 1 suppository (25 mg total) rectally 2 (two) times daily., Disp: 12 suppository, Rfl: 0 .  insulin glargine (LANTUS) 100 UNIT/ML injection, Inject 0.6 mLs (60 Units total) into the skin every morning. Lantus Solostar, Disp: 10 mL, Rfl: 5 .  Insulin Pen Needle (RELION PEN NEEDLE 31G/8MM) 31G X 8 MM MISC, Use as Directed daily to check blood sugars, Disp: 100 each, Rfl: 3 .  Insulin Syringe-Needle U-100 30G 1 ML MISC, USE AS DIRECTED TO INJECT INSULIN DX: 250.00, Disp: 100 each, Rfl: 3 .  LANTUS SOLOSTAR 100 UNIT/ML Solostar Pen,  INJECT 85 UNITS SUBCUTANEOUSLY EVERYDAY, Disp: 30 mL, Rfl: 5 .  metFORMIN (GLUCOPHAGE) 1000 MG tablet, TAKE 1 TABLET (1,000 MG) BY MOUTH 2 TIMES DAILY WITH A MEAL., Disp: 60 tablet, Rfl: 2 .  NEEDLE, DISP, 21 G (BD SAFETYGLIDE NEEDLE) 21G X 1" MISC, Use Every 14 Days to Draw Testosterone Cypionate, Disp: 50 each, Rfl: 0 .  omeprazole (PRILOSEC) 20 MG capsule, TAKE 1 CAPSULE BY MOUTH DAILY., Disp: 30 capsule, Rfl: 2 .  ONE TOUCH LANCETS MISC, Use as directed, Disp: 200 each, Rfl: 11 .  ramipril (ALTACE) 10 MG capsule, TAKE 2 CAPSULES BY MOUTH DAILY, Disp: 60 capsule, Rfl: 3 .  sertraline (ZOLOFT) 50 MG tablet, TAKE 1&1/2 TABLETS (75 MG TOTAL) BY MOUTH DAILY., Disp: 45 tablet, Rfl: 5 .  sildenafil (VIAGRA) 100 MG tablet, TAKE 1/2 TO 1 TABLET (50-100 MG TOTAL) BY MOUTH DAILY AS NEEDED FOR ERECTILE DYSFUNCTION., Disp: 4 tablet, Rfl: 2 .  simvastatin (ZOCOR) 40 MG tablet, TAKE 1 TABLET BY MOUTH AT BEDTIME, Disp: 30 tablet, Rfl: 2 .  SYRINGE-NEEDLE, DISP, 3 ML (SAFETY-LOK 3CC SYR 23GX1") 23G X 1" 3 ML MISC, Use Every 14 Days to Administer Testosterone Cypionate Injection, Disp: 50 each, Rfl: 5 .  testosterone cypionate (DEPOTESTOTERONE CYPIONATE) 200 MG/ML injection, Inject 1 mL (200 mg total) into the muscle every 14 (fourteen) days.,  Disp: 10 mL, Rfl: 0 .  azithromycin (ZITHROMAX) 250 MG tablet, Take 2 tablets on Day 1.  Then take 1 tablet daily., Disp: 6 tablet, Rfl: 0 No Known Allergies Family History  Problem Relation Age of Onset  . Diabetes Mother   . Thyroid disease Mother     Questionable  . Heart disease Father     deceased   History   Social History  . Marital Status: Married    Spouse Name: N/A    Number of Children: N/A  . Years of Education: N/A   Social History Main Topics  . Smoking status: Never Smoker   . Smokeless tobacco: Never Used  . Alcohol Use: None  . Drug Use: None  . Sexual Activity: None   Other Topics Concern  . 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  All other ROS are negative.  Physical Examination: BP 135/80 mmHg  Pulse 89  Temp(Src) 98.1 F (36.7 C) (Oral)  Resp 16  Ht 6' (1.829 m)  Wt 216 lb 6 oz (98.147 kg)  BMI 29.34 kg/m2  SpO2 97%  General appearance: alert, cooperative and appears stated age Head: Normocephalic, without obvious abnormality, atraumatic, sinuses tender to percussion Eyes: conjunctivae/corneas clear. PERRL, EOM's intact. Fundi benign. Ears: normal TM's and external ear canals both ears Nose: moderate congestion, turbinates swollen, sinus tenderness bilateral Throat: lips, mucosa, and tongue normal; teeth and gums normal Neck: no adenopathy, no carotid bruit, no JVD, supple, symmetrical, trachea midline and thyroid not enlarged, symmetric, no tenderness/mass/nodules Lungs: clear to auscultation bilaterally Chest wall: no tenderness  Assessment/Plan: Acute bacterial sinusitis Rx Azithromycin.  Increase fluids.  Rest.  Saline nasal spray.  Probiotic.  Mucinex as directed.  Humidifier in bedroom. Continue chronic medications as directed.  Call or return to clinic if symptoms are not improving.

## 2014-03-04 NOTE — Assessment & Plan Note (Signed)
Rx Azithromycin.  Increase fluids.  Rest.  Saline nasal spray.  Probiotic.  Mucinex as directed.  Humidifier in bedroom. Continue chronic medications as directed.  Call or return to clinic if symptoms are not improving.

## 2014-03-04 NOTE — Progress Notes (Signed)
Pre visit review using our clinic review tool, if applicable. No additional management support is needed unless otherwise documented below in the visit note/SLS  

## 2014-03-07 ENCOUNTER — Encounter: Payer: Self-pay | Admitting: Physician Assistant

## 2014-03-07 DIAGNOSIS — J019 Acute sinusitis, unspecified: Principal | ICD-10-CM

## 2014-03-07 DIAGNOSIS — B9689 Other specified bacterial agents as the cause of diseases classified elsewhere: Secondary | ICD-10-CM

## 2014-03-07 MED ORDER — AMOXICILLIN-POT CLAVULANATE 875-125 MG PO TABS: 1.0000 | ORAL_TABLET | Freq: Two times a day (BID) | ORAL | Status: DC

## 2014-03-07 NOTE — Telephone Encounter (Signed)
Please Advise

## 2014-03-07 NOTE — Telephone Encounter (Signed)
Handled. MyChart message sent to patient.

## 2014-03-25 ENCOUNTER — Encounter: Payer: Self-pay | Admitting: Physician Assistant

## 2014-03-26 ENCOUNTER — Other Ambulatory Visit: Payer: Self-pay | Admitting: Physician Assistant

## 2014-03-26 DIAGNOSIS — K640 First degree hemorrhoids: Secondary | ICD-10-CM

## 2014-03-26 MED ORDER — HYDROCORTISONE ACETATE 25 MG RE SUPP: 25.0000 mg | Freq: Two times a day (BID) | RECTAL | Status: DC

## 2014-03-28 ENCOUNTER — Other Ambulatory Visit: Payer: Self-pay | Admitting: *Deleted

## 2014-03-28 MED ORDER — ALPRAZOLAM 1 MG PO TABS: ORAL_TABLET | ORAL | Status: DC

## 2014-03-28 NOTE — Telephone Encounter (Signed)
Rx request phoned to pharmacy per VO provider/SLS

## 2014-04-01 ENCOUNTER — Other Ambulatory Visit: Payer: Self-pay | Admitting: Physician Assistant

## 2014-04-01 NOTE — Telephone Encounter (Signed)
We need to call the patient to ask about these medications.  He should have been due for these medications much sooner.

## 2014-04-01 NOTE — Telephone Encounter (Signed)
Medication Detail      Disp Refills Start End     buPROPion (WELLBUTRIN XL) 150 MG 24 hr tablet 30 tablet 4 10/04/2013     Sig: TAKE 1 TABLET BY MOUTH ONCE DAILY    E-Prescribing Status: Receipt confirmed by pharmacy (10/04/2013 1:34 PM EDT)      Medication Detail      Disp Refills Start End     metFORMIN (GLUCOPHAGE) 1000 MG tablet 60 tablet 2 10/04/2013     Sig: TAKE 1 TABLET (1,000 MG) BY MOUTH 2 TIMES DAILY WITH A MEAL.    E-Prescribing Status: Receipt confirmed by pharmacy (10/04/2013 1:40 PM EDT)      Rx last filled in August 2015 and should have been due earlier, sent to you as I am unsure, as patient tends to self adjust his medications/SLS Please Advise on refills.

## 2014-04-04 NOTE — Telephone Encounter (Signed)
Rx request to pharmacy/SLS  

## 2014-04-04 NOTE — Telephone Encounter (Signed)
LMOM with contact name and number for return call RE: requested medications per provider instructions/SLS

## 2014-04-04 NOTE — Telephone Encounter (Signed)
Patient states that he discussed with provider at last OV having some excess of these medications in [place] he is moving his mother from] and is just now in need of medication; Rx request to pharmacy, provider informed/SLS

## 2014-04-10 ENCOUNTER — Encounter: Payer: Self-pay | Admitting: Physician Assistant

## 2014-04-10 DIAGNOSIS — E291 Testicular hypofunction: Secondary | ICD-10-CM

## 2014-04-10 DIAGNOSIS — E119 Type 2 diabetes mellitus without complications: Secondary | ICD-10-CM

## 2014-05-02 ENCOUNTER — Other Ambulatory Visit: Payer: Self-pay | Admitting: Physician Assistant

## 2014-05-02 NOTE — Telephone Encounter (Signed)
Rx request to pharmacy/SLS  

## 2014-06-02 ENCOUNTER — Other Ambulatory Visit: Payer: Self-pay | Admitting: Family Medicine

## 2014-06-08 ENCOUNTER — Other Ambulatory Visit: Payer: Self-pay | Admitting: Physician Assistant

## 2014-06-27 ENCOUNTER — Other Ambulatory Visit (INDEPENDENT_AMBULATORY_CARE_PROVIDER_SITE_OTHER): Payer: BLUE CROSS/BLUE SHIELD

## 2014-06-27 DIAGNOSIS — E291 Testicular hypofunction: Secondary | ICD-10-CM

## 2014-06-27 DIAGNOSIS — E119 Type 2 diabetes mellitus without complications: Secondary | ICD-10-CM | POA: Diagnosis not present

## 2014-06-27 LAB — HEMOGLOBIN A1C: HEMOGLOBIN A1C: 7.6 % — AB (ref 4.6–6.5)

## 2014-06-27 LAB — BASIC METABOLIC PANEL
BUN: 18 mg/dL (ref 6–23)
CO2: 27 mEq/L (ref 19–32)
Calcium: 9.1 mg/dL (ref 8.4–10.5)
Chloride: 102 mEq/L (ref 96–112)
Creatinine, Ser: 0.84 mg/dL (ref 0.40–1.50)
GFR: 100.45 mL/min (ref >60.00)
GLUCOSE: 141 mg/dL — AB (ref 70–99)
POTASSIUM: 4.5 meq/L (ref 3.5–5.1)
SODIUM: 137 meq/L (ref 135–145)

## 2014-06-27 LAB — TESTOSTERONE: Testosterone: 114.26 ng/dL — ABNORMAL LOW (ref 300.00–890.00)

## 2014-06-28 NOTE — Addendum Note (Signed)
Addended by: Raiford Noble on: 06/28/2014 02:34 PM   Modules accepted: Orders

## 2014-07-01 ENCOUNTER — Encounter: Payer: Self-pay | Admitting: Endocrinology

## 2014-07-01 ENCOUNTER — Other Ambulatory Visit: Payer: Self-pay | Admitting: Physician Assistant

## 2014-07-01 ENCOUNTER — Ambulatory Visit (INDEPENDENT_AMBULATORY_CARE_PROVIDER_SITE_OTHER): Payer: BLUE CROSS/BLUE SHIELD | Admitting: Endocrinology

## 2014-07-01 VITALS — BP 136/84 | HR 94 | Temp 97.6°F | Ht 72.0 in | Wt 226.0 lb

## 2014-07-01 DIAGNOSIS — E291 Testicular hypofunction: Secondary | ICD-10-CM | POA: Diagnosis not present

## 2014-07-01 NOTE — Progress Notes (Signed)
Subjective:    Patient ID: William [name], male    DOB: 03-21-1960, 54 y.o.   MRN: [id number]  HPI Pt reports he had puberty at the normal age.  He has 3 biological children.  He says he has never taken illicit androgens.  He was found to have hypogonadism in 2013.  has been on prescribed testosterone since then.  He does not take antiandrogens or opioids.  He denies any h/o infertility, XRT, or genital infection.  He has never had surgery, or a serious injury to the head or genital area.  He does not consume alcohol excessively.  He reports slightly decreased muscle strength throughout the body, and assoc fatigue.  He took androgel 2013-2015.  On this, symptoms and testosterone level improved only temporarily.  He then started testosterone injections.  On this, he still has fatigue.  He has never had evaluation of any possible cause of hypogonadism.   Past Medical History  Diagnosis Date  . Asthma     history  . Arthritis   . History of chicken pox   . Adjustment disorder with mixed anxiety and depressed mood   . Diabetes mellitus     type 2  . Elevated blood pressure     history of high blood pressure readings  . Hypercholesteremia     Past Surgical History  Procedure Laterality Date  . Nasal septum surgery      1992  . Knee surgery      right knee new ACL- 1993,2000  . Knee surgery      left knee 2003  . Partial hip arthroplasty      right hip replacement  . Elbow surgery      right elbow 2010, left elbow 08/06/10 [name])    History   Social History  . Marital Status: Married    Spouse Name: N/A  . Number of Children: N/A  . Years of Education: N/A   Occupational History  . Not on file.   Social History Main Topics  . Smoking status: Never Smoker   . Smokeless tobacco: Never Used  . Alcohol Use: Not on file  . Drug Use: Not on file  . Sexual Activity: Not on file   Other Topics Concern  . Not on file   Social History Narrative   Occupation: Real  Sport and exercise psychologist   Married -36 marriage (2nd marriage)   daughter 65,  2 sons (2nd marriage  11,6)   [place]   Never Smoked    Alcohol use-yes   Drug use-no    Regular exercise-no   Smoking Status:  never   Does Patient Exercise:  no   Caffeine use/day:  3-4 cups coffee daily   Drug Use:  no          Current Outpatient Prescriptions on File Prior to Visit  Medication Sig Dispense Refill  . aspirin 81 MG tablet Take 81 mg by mouth daily.    . Blood Glucose Monitoring Suppl (ONE TOUCH ULTRA SYSTEM KIT) W/DEVICE KIT 1 kit by Does not apply route once. Use to check blood sugar four times daily     . buPROPion (WELLBUTRIN XL) 150 MG 24 hr tablet TAKE 1 TABLET BY MOUTH ONCE DAILY 30 tablet 4  . glucose blood (ACCU-CHEK COMPACT STRIPS) test strip Use as instructed to check blood sugar eight times a day. 300 each 2  . hydrocortisone (ANUSOL-HC) 25 MG suppository Place 1 suppository (25 mg total) rectally 2 (two) times  daily. 12 suppository 0  . insulin glargine (LANTUS) 100 UNIT/ML injection Inject 0.6 mLs (60 Units total) into the skin every morning. Lantus Solostar 10 mL 5  . Insulin Pen Needle (RELION PEN NEEDLE 31G/8MM) 31G X 8 MM MISC Use as Directed daily to check blood sugars 100 each 3  . Insulin Syringe-Needle U-100 30G 1 ML MISC USE AS DIRECTED TO INJECT INSULIN DX: 250.00 100 each 3  . LANTUS SOLOSTAR 100 UNIT/ML Solostar Pen INJECT 85 UNITS SUBCUTANEOUSLY EVERYDAY 30 mL 5  . metFORMIN (GLUCOPHAGE) 1000 MG tablet TAKE 1 TABLET (1,000 MG) BY MOUTH 2 TIMES DAILY WITH A MEAL. 60 tablet 2  . NEEDLE, DISP, 21 G (BD SAFETYGLIDE NEEDLE) 21G X 1" MISC Use Every 14 Days to Draw Testosterone Cypionate 50 each 0  . ONE TOUCH LANCETS MISC Use as directed 200 each 11  . ramipril (ALTACE) 10 MG capsule TAKE 2 CAPSULES BY MOUTH DAILY 60 capsule 3  . sertraline (ZOLOFT) 50 MG tablet TAKE 1&1/2 TABLETS (75 MG TOTAL) BY MOUTH DAILY. 45 tablet 5  . simvastatin (ZOCOR) 40 MG tablet TAKE 1 TABLET BY MOUTH AT  BEDTIME 30 tablet 6  . SYRINGE-NEEDLE, DISP, 3 ML (SAFETY-LOK 3CC SYR 23GX1") 23G X 1" 3 ML MISC Use Every 14 Days to Administer Testosterone Cypionate Injection 50 each 5  . VIAGRA 100 MG tablet TAKE 1/2 TO 1 TABLET (50-100 MG TOTAL) BY MOUTH DAILY AS NEEDED FOR ERECTILE DYSFUNCTION. 4 tablet 2   No current facility-administered medications on file prior to visit.    No Known Allergies  Family History  Problem Relation Age of Onset  . Diabetes Mother   . Thyroid disease Mother     Questionable  . Heart disease Father     deceased  . Other Neg Hx     hypogonadism    BP 136/84 mmHg  Pulse 94  Temp(Src) 97.6 F (36.4 C) (Oral)  Ht 6' (1.829 m)  Wt 226 lb (102.513 kg)  BMI 30.64 kg/m2  SpO2 95%  Review of Systems denies depression, numbness, weight change, decreased urinary stream, gynecomastia, arthralgias, fever, headache, easy bruising, sob, rash, blurry vision, rhinorrhea, chest pain.  He has ED sxs.      Objective:   Physical Exam VS: see vs page GEN: no distress HEAD: head: no deformity eyes: no periorbital swelling, no proptosis external nose and ears are normal mouth: no lesion seen NECK: supple, thyroid is not enlarged CHEST WALL: no deformity LUNGS: clear to auscultation BREASTS:  No gynecomastia CV: reg rate and rhythm, no murmur ABD: abdomen is soft, nontender.  no hepatosplenomegaly.  not distended.  no hernia GENITALIA:  Normal male.  MUSCULOSKELETAL: muscle bulk and strength are grossly normal.  no obvious joint swelling.  gait is normal and steady EXTEMITIES: no deformity.  no ulcer on the feet.  feet are of normal color and temp.  no edema PULSES: dorsalis pedis intact bilat.  no carotid bruit NEURO:  cn 2-12 grossly intact.   readily moves all 4's.  sensation is intact to touch on the feet SKIN:  Normal texture and temperature.  No rash or suspicious lesion is visible.   NODES:  None palpable at the neck PSYCH: alert, well-oriented.  Does not  appear anxious nor depressed.   Lab Results  Component Value Date   TESTOSTERONE 114.26* 06/27/2014  (this was 1-2 days after most recent injection)    Assessment & Plan:  Hypogonadism: new, uncertain etiology.  Pt agrees to  d/c testosterone injections for now, to eval for cause.     Patient is advised the following: Patient Instructions  Please come back for a follow-up appointment in 3 months, with blood tests a few days ahead of time. This will tell us the cause of the low testosterone.

## 2014-07-01 NOTE — Patient Instructions (Addendum)
Please come back for a follow-up appointment in 3 months, with blood tests a few days ahead of time. This will tell us the cause of the low testosterone.

## 2014-09-16 ENCOUNTER — Telehealth: Payer: Self-pay | Admitting: Physician Assistant

## 2014-09-16 NOTE — Telephone Encounter (Signed)
Caller name:Ben Relation to pt: Call back number:[phone] Pharmacy:[facility]  Reason for call: pharmacy needs to know if its ok to fill pt rx LANTUS SOLOSTAR 100 UNIT/ML Solostar Pen 3 months at a time for insurance purposes

## 2014-09-18 NOTE — Telephone Encounter (Signed)
Ok to grant Rx for 3 months with 1 refill.

## 2014-09-19 MED ORDER — INSULIN GLARGINE 100 UNIT/ML SOLOSTAR PEN: 85.0000 [IU] | PEN_INJECTOR | Freq: Every day | SUBCUTANEOUS | Status: DC

## 2014-09-19 NOTE — Telephone Encounter (Signed)
Rx sent to the pharmacy by e-script.//AB/CMA 

## 2014-09-26 ENCOUNTER — Encounter: Payer: Self-pay | Admitting: Physician Assistant

## 2014-09-26 DIAGNOSIS — R7989 Other specified abnormal findings of blood chemistry: Secondary | ICD-10-CM

## 2014-09-27 ENCOUNTER — Other Ambulatory Visit (INDEPENDENT_AMBULATORY_CARE_PROVIDER_SITE_OTHER): Payer: Federal, State, Local not specified - PPO

## 2014-09-27 ENCOUNTER — Telehealth: Payer: Self-pay | Admitting: Physician Assistant

## 2014-09-27 ENCOUNTER — Other Ambulatory Visit: Payer: Self-pay | Admitting: Physician Assistant

## 2014-09-27 DIAGNOSIS — E291 Testicular hypofunction: Secondary | ICD-10-CM

## 2014-09-27 LAB — IBC PANEL
IRON: 76 ug/dL (ref 42–165)
SATURATION RATIOS: 19.1 % — AB (ref 20.0–50.0)
TRANSFERRIN: 284 mg/dL (ref 212.0–360.0)

## 2014-09-27 LAB — LUTEINIZING HORMONE: LH: 2.3 m[IU]/mL (ref 1.50–9.30)

## 2014-09-27 LAB — TSH: TSH: 4.06 u[IU]/mL (ref 0.35–4.50)

## 2014-09-27 LAB — FOLLICLE STIMULATING HORMONE: FSH: 3.4 m[IU]/mL (ref 1.4–18.1)

## 2014-09-27 NOTE — Addendum Note (Signed)
Addended by: Harl Bowie on: 09/27/2014 10:14 AM   Modules accepted: Orders

## 2014-09-27 NOTE — Telephone Encounter (Signed)
Relation to pt: self  Call back number:[phone]   Reason for call:  Patient walked in the office 09/27/14 approxmately 5:40pm stating PA advised him to schedule appointment for 09/28/14 but did not know what type of appointment was needed. Please confirm

## 2014-09-27 NOTE — Telephone Encounter (Signed)
Requesting:  Alprazolam, Metformin and Wellbutrin Contract  None UDS  None Last OV   03/04/14 Last Refill   07/01/14  #60 with 2 refills (ALPRAZOLAM)  Please Advise on all 3 refill request.

## 2014-09-28 ENCOUNTER — Encounter: Payer: Self-pay | Admitting: Physician Assistant

## 2014-09-28 ENCOUNTER — Ambulatory Visit (INDEPENDENT_AMBULATORY_CARE_PROVIDER_SITE_OTHER): Payer: Federal, State, Local not specified - PPO | Admitting: Physician Assistant

## 2014-09-28 VITALS — BP 132/82 | HR 89 | Temp 97.9°F | Resp 16 | Ht 72.0 in | Wt 222.2 lb

## 2014-09-28 DIAGNOSIS — F4322 Adjustment disorder with anxiety: Secondary | ICD-10-CM | POA: Diagnosis not present

## 2014-09-28 LAB — TESTOSTERONE, FREE, TOTAL, SHBG
Sex Hormone Binding: 7 nmol/L — ABNORMAL LOW (ref 22–77)
TESTOSTERONE FREE: 66.6 pg/mL (ref 47.0–244.0)
Testosterone-% Free: 3.4 % — ABNORMAL HIGH (ref 1.6–2.9)
Testosterone: 194 ng/dL — ABNORMAL LOW (ref 300–890)

## 2014-09-28 LAB — PROLACTIN: Prolactin: 3.4 ng/mL (ref 2.1–17.1)

## 2014-09-28 NOTE — Progress Notes (Signed)
Pre visit review using our clinic review tool, if applicable. No additional management support is needed unless otherwise documented below in the visit note/SLS  

## 2014-09-28 NOTE — Telephone Encounter (Signed)
He only needed follow-up at some point within next 30 days before we can do further refills of medications.

## 2014-09-28 NOTE — Patient Instructions (Signed)
Please continue medications as directed. Stay active and try to maintain a well-balanced diet.  Follow-up in December for a physical exam.

## 2014-09-28 NOTE — Progress Notes (Signed)
Patient presents to clinic today for follow-up of depression. Is taking Wellbutrin XL and Sertraline daily as directed. Endorses mood greatly improved with medication regimen and now that some specific stressors have resolved. Denies depressed mood or anhedonia while on medication regimen. Denies SI/HI.  Past Medical History  Diagnosis Date  . Asthma     history  . Arthritis   . History of chicken pox   . Adjustment disorder with mixed anxiety and depressed mood   . Diabetes mellitus     type 2  . Elevated blood pressure     history of high blood pressure readings  . Hypercholesteremia     Current Outpatient Prescriptions on File Prior to Visit  Medication Sig Dispense Refill  . ALPRAZolam (XANAX) 1 MG tablet TAKE 1 TO 2 TABLETS BY MOUTH AT BEDTIME AS NEEDED FOR SLEEP AND ANXIETY 60 tablet 0  . aspirin 81 MG tablet Take 81 mg by mouth daily.    . Blood Glucose Monitoring Suppl (ONE TOUCH ULTRA SYSTEM KIT) W/DEVICE KIT 1 kit by Does not apply route once. Use to check blood sugar four times daily     . buPROPion (WELLBUTRIN XL) 150 MG 24 hr tablet TAKE 1 TABLET BY MOUTH ONCE DAILY 30 tablet 0  . glucose blood (ACCU-CHEK COMPACT STRIPS) test strip Use as instructed to check blood sugar eight times a day. 300 each 2  . Insulin Glargine (LANTUS SOLOSTAR) 100 UNIT/ML Solostar Pen Inject 85 Units into the skin daily at 10 pm. 90 mL 3  . Insulin Pen Needle (RELION PEN NEEDLE 31G/8MM) 31G X 8 MM MISC Use as Directed daily to check blood sugars 100 each 3  . Insulin Syringe-Needle U-100 30G 1 ML MISC USE AS DIRECTED TO INJECT INSULIN DX: 250.00 100 each 3  . metFORMIN (GLUCOPHAGE) 1000 MG tablet TAKE 1 TABLET (1,000 MG) BY MOUTH 2 TIMES DAILY WITH A MEAL. 60 tablet 0  . omeprazole (PRILOSEC) 20 MG capsule TAKE 1 CAPSULE BY MOUTH DAILY. 30 capsule 5  . ONE TOUCH LANCETS MISC Use as directed 200 each 11  . ramipril (ALTACE) 10 MG capsule TAKE 2 CAPSULES BY MOUTH DAILY 60 capsule 3  . sertraline  (ZOLOFT) 50 MG tablet TAKE 1&1/2 TABLETS (75 MG TOTAL) BY MOUTH DAILY. 45 tablet 5  . simvastatin (ZOCOR) 40 MG tablet TAKE 1 TABLET BY MOUTH AT BEDTIME 30 tablet 6  . VIAGRA 100 MG tablet TAKE 1/2 TO 1 TABLET (50-100 MG TOTAL) BY MOUTH DAILY AS NEEDED FOR ERECTILE DYSFUNCTION. 4 tablet 2   No current facility-administered medications on file prior to visit.    No Known Allergies  Family History  Problem Relation Age of Onset  . Diabetes Mother   . Thyroid disease Mother     Questionable  . Heart disease Father     deceased  . Other Neg Hx     hypogonadism    Social History   Social History  . Marital Status: Married    Spouse Name: N/A  . Number of Children: N/A  . Years of Education: N/A   Social History Main Topics  . Smoking status: Never Smoker   . Smokeless tobacco: Never Used  . Alcohol Use: None  . Drug Use: None  . Sexual Activity: Not Asked   Other Topics Concern  . None   Social History Narrative   Occupation: Engineer, site   Married -53 marriage (2nd marriage)   daughter 47,  2 sons (2nd  marriage  11,6)   [place]   Never Smoked    Alcohol use-yes   Drug use-no    Regular exercise-no   Smoking Status:  never   Does Patient Exercise:  no   Caffeine use/day:  3-4 cups coffee daily   Drug Use:  no          Review of Systems - See HPI.  All other ROS are negative.  BP 132/82 mmHg  Pulse 89  Temp(Src) 97.9 F (36.6 C) (Oral)  Resp 16  Ht 6' (1.829 m)  Wt 222 lb 4 oz (100.812 kg)  BMI 30.14 kg/m2  SpO2 96%  Physical Exam  Constitutional: He is well-developed, well-nourished, and in no distress.  HENT:  Head: Normocephalic and atraumatic.  Eyes: Conjunctivae are normal.  Neck: Neck supple.  Cardiovascular: Normal rate, regular rhythm, normal heart sounds and intact distal pulses.   Pulmonary/Chest: Effort normal.  Psychiatric: Affect normal.  Vitals reviewed.   Recent Results (from the past 2160 hour(s))  TSH     Status: None    Collection Time: 09/27/14  9:23 AM  Result Value Ref Range   TSH 4.06 0.35 - 4.50 uIU/mL  Luteinizing hormone     Status: None   Collection Time: 09/27/14  9:23 AM  Result Value Ref Range   LH 2.30 1.50 - 9.30 mIU/mL    Comment: Male Reference Range:20-70 yrs     1.5-9.3 mIU/mL>70 yrs       3.1-35.6 mIU/mLFemale Reference Range:Follicular Phase     1.6-10.9 mIU/mLMidcycle             8.7-76.3 mIU/mLLuteal Phase         0.5-16.9 mIU/mL  Post Menopausal      15.9-54.0  mIU/mLPregnant             <1.5 mIU/mLContraceptives       6.0-4.5 mIU/mL   Follicle stimulating hormone     Status: None   Collection Time: 09/27/14  9:23 AM  Result Value Ref Range   FSH 3.4 1.4 - 18.1 mIU/ML    Comment: Male Reference Range:  1.4-18.1 mIU/mLFemale Reference Range:Follicular Phase          2.5-10.2 mIU/mLMidCycle Peak          3.4-33.4 mIU/mLLuteal Phase          1.5-9.1 mIU/mLPost Menopausal     23.0-116.3 mIU/mLPregnant          <0.3 mIU/mL  Prolactin     Status: None   Collection Time: 09/27/14  9:23 AM  Result Value Ref Range   Prolactin 3.4 2.1 - 17.1 ng/mL    Comment:      Reference Ranges:                  Male:                       2.1 -  17.1 ng/ml                  Male:   Pregnant          9.7 - 208.5 ng/mL                            Non Pregnant      2.8 -  29.2 ng/mL  Post Menopausal   1.8 -  20.3 ng/mL                      IBC panel     Status: Abnormal   Collection Time: 09/27/14  9:23 AM  Result Value Ref Range   Iron 76 42 - 165 ug/dL   Transferrin 284.0 212.0 - 360.0 mg/dL   Saturation Ratios 19.1 (L) 20.0 - 50.0 %  Testosterone, Free, Total, SHBG     Status: Abnormal   Collection Time: 09/27/14 10:15 AM  Result Value Ref Range   Testosterone 194 (L) 300 - 890 ng/dL    Comment:           Tanner Stage       Male              Male               I              < 30 ng/dL        < 10 ng/dL               II             < 150 ng/dL       < 30 ng/dL                III            100-320 ng/dL     < 35 ng/dL               IV             200-970 ng/dL     15-40 ng/dL               V/Adult        300-890 ng/dL     10-70 ng/dL      Sex Hormone Binding 7 (L) 22 - 77 nmol/L   Testosterone, Free 66.6 47.0 - 244.0 pg/mL    Comment:   The concentration of free testosterone is derived from a mathematical expression based on constants for the binding of testosterone to sex hormone-binding globulin and albumin.    Testosterone-% Free 3.4 (H) 1.6 - 2.9 %    Assessment/Plan: ADJUSTMENT DISORDER WITH ANXIOUS MOOD Stable. Continue current medication regimen. Follow-up 3 months for CPE.

## 2014-09-29 NOTE — Assessment & Plan Note (Signed)
Stable. Continue current medication regimen. Follow-up 3 months for CPE.

## 2014-10-03 ENCOUNTER — Encounter: Payer: Self-pay | Admitting: Endocrinology

## 2014-10-03 ENCOUNTER — Ambulatory Visit (INDEPENDENT_AMBULATORY_CARE_PROVIDER_SITE_OTHER): Payer: Federal, State, Local not specified - PPO | Admitting: Endocrinology

## 2014-10-03 VITALS — BP 134/88 | HR 90 | Ht 72.0 in | Wt 222.0 lb

## 2014-10-03 DIAGNOSIS — E119 Type 2 diabetes mellitus without complications: Secondary | ICD-10-CM

## 2014-10-03 DIAGNOSIS — Z23 Encounter for immunization: Secondary | ICD-10-CM | POA: Diagnosis not present

## 2014-10-03 DIAGNOSIS — E349 Endocrine disorder, unspecified: Secondary | ICD-10-CM

## 2014-10-03 DIAGNOSIS — E291 Testicular hypofunction: Secondary | ICD-10-CM | POA: Diagnosis not present

## 2014-10-03 LAB — POCT GLYCOSYLATED HEMOGLOBIN (HGB A1C): Hemoglobin A1C: 7.3

## 2014-10-03 NOTE — Progress Notes (Signed)
Subjective:    Patient ID: William [name], male    DOB: 1960-12-19, 54 y.o.   MRN: [id number]  HPI Pt returns for f/u of low testosterone (dx'ed 2013; he has 3 biological children; he took androgel 2013-2015; on this, symptoms and testosterone level improved only temporarily; he then took testosterone injections from 2013-2016, unltil he stopped to allow further evaluation).  Since off the injections, pt states he feels well in general, except for fatigue.   Past Medical History  Diagnosis Date  . Asthma     history  . Arthritis   . History of chicken pox   . Adjustment disorder with mixed anxiety and depressed mood   . Diabetes mellitus     type 2  . Elevated blood pressure     history of high blood pressure readings  . Hypercholesteremia     Past Surgical History  Procedure Laterality Date  . Nasal septum surgery      1992  . Knee surgery      right knee new ACL- 1993,2000  . Knee surgery      left knee 2003  . Partial hip arthroplasty      right hip replacement  . Elbow surgery      right elbow 2010, left elbow 08/06/10 [name])    Social History   Social History  . Marital Status: Married    Spouse Name: N/A  . Number of Children: N/A  . Years of Education: N/A   Occupational History  . Not on file.   Social History Main Topics  . Smoking status: Never Smoker   . Smokeless tobacco: Never Used  . Alcohol Use: Not on file  . Drug Use: Not on file  . Sexual Activity: Not on file   Other Topics Concern  . Not on file   Social History Narrative   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   Married -78 marriage (2nd marriage)   daughter 64,  2 sons (2nd marriage  11,6)   [place]   Never Smoked    Alcohol use-yes   Drug use-no    Regular exercise-no   Smoking Status:  never   Does Patient Exercise:  no   Caffeine use/day:  3-4 cups coffee daily   Drug Use:  no          Current Outpatient Prescriptions on File Prior to Visit  Medication Sig Dispense Refill    . ALPRAZolam (XANAX) 1 MG tablet TAKE 1 TO 2 TABLETS BY MOUTH AT BEDTIME AS NEEDED FOR SLEEP AND ANXIETY 60 tablet 0  . aspirin 81 MG tablet Take 81 mg by mouth daily.    . Blood Glucose Monitoring Suppl (ONE TOUCH ULTRA SYSTEM KIT) W/DEVICE KIT 1 kit by Does not apply route once. Use to check blood sugar four times daily     . buPROPion (WELLBUTRIN XL) 150 MG 24 hr tablet TAKE 1 TABLET BY MOUTH ONCE DAILY 30 tablet 0  . glucose blood (ACCU-CHEK COMPACT STRIPS) test strip Use as instructed to check blood sugar eight times a day. 300 each 2  . Insulin Glargine (LANTUS SOLOSTAR) 100 UNIT/ML Solostar Pen Inject 85 Units into the skin daily at 10 pm. 90 mL 3  . Insulin Pen Needle (RELION PEN NEEDLE 31G/8MM) 31G X 8 MM MISC Use as Directed daily to check blood sugars 100 each 3  . Insulin Syringe-Needle U-100 30G 1 ML MISC USE AS DIRECTED TO INJECT INSULIN DX: 250.00 100 each 3  .  metFORMIN (GLUCOPHAGE) 1000 MG tablet TAKE 1 TABLET (1,000 MG) BY MOUTH 2 TIMES DAILY WITH A MEAL. 60 tablet 0  . omeprazole (PRILOSEC) 20 MG capsule TAKE 1 CAPSULE BY MOUTH DAILY. 30 capsule 5  . ONE TOUCH LANCETS MISC Use as directed 200 each 11  . ramipril (ALTACE) 10 MG capsule TAKE 2 CAPSULES BY MOUTH DAILY 60 capsule 3  . sertraline (ZOLOFT) 50 MG tablet TAKE 1&1/2 TABLETS (75 MG TOTAL) BY MOUTH DAILY. 45 tablet 5  . simvastatin (ZOCOR) 40 MG tablet TAKE 1 TABLET BY MOUTH AT BEDTIME 30 tablet 6  . VIAGRA 100 MG tablet TAKE 1/2 TO 1 TABLET (50-100 MG TOTAL) BY MOUTH DAILY AS NEEDED FOR ERECTILE DYSFUNCTION. 4 tablet 2   No current facility-administered medications on file prior to visit.   No Known Allergies  Family History  Problem Relation Age of Onset  . Diabetes Mother   . Thyroid disease Mother     Questionable  . Heart disease Father     deceased  . Other Neg Hx     hypogonadism    BP 134/88 mmHg  Pulse 90  Ht 6' (1.829 m)  Wt 222 lb (100.699 kg)  BMI 30.10 kg/m2  SpO2 94%  Review of  Systems Denies decreased urinary stream.      Objective:   Physical Exam VITAL SIGNS:  See vs page GENERAL: no distress Ext: no edema.    Lab Results  Component Value Date   TESTOSTERONE 194* 09/27/2014  free testosterone=normal    Assessment & Plan:  Hypogonadism: as free testosterone is normal, he does not need medication for this.    Patient is advised the following: Patient Instructions  These blood tests tell us that the low testosterone is due to a low protein level.   Please recheck the blood tests in 2 months.  Weight loss helps, too.  I would be happy to see you back here as necessary.

## 2014-10-03 NOTE — Patient Instructions (Addendum)
These blood tests tell us that the low testosterone is due to a low protein level.   Please recheck the blood tests in 2 months.  Weight loss helps, too.  I would be happy to see you back here as necessary.

## 2014-10-04 ENCOUNTER — Other Ambulatory Visit: Payer: Self-pay

## 2014-10-04 MED ORDER — GLUCOSE BLOOD VI STRP: ORAL_STRIP | Status: DC

## 2014-10-27 ENCOUNTER — Telehealth: Payer: Self-pay | Admitting: Medical

## 2014-10-27 MED ORDER — ALPRAZOLAM 1 MG PO TABS: ORAL_TABLET | ORAL | Status: DC

## 2014-10-27 NOTE — Telephone Encounter (Signed)
Caller name:Tracey Relation to pt: self Call back number:  Pharmacy:[facility]  Reason for call:  Pt is requesting rx ALPRAZolam (XANAX) 1 MG tablet, please advise.

## 2014-10-27 NOTE — Telephone Encounter (Signed)
William [name] will give pt a two week supply until pt can see cody.

## 2014-10-27 NOTE — Telephone Encounter (Signed)
Pt came in office stating had requested med from his pharmacy 2 day ago and came in office wanting to have rx for today 10-27-14.

## 2014-10-27 NOTE — Addendum Note (Signed)
Addended by: Tasia Catchings on: 10/27/2014 06:03 PM   Modules accepted: Orders

## 2014-10-27 NOTE — Telephone Encounter (Signed)
Thank you for doing that in my absence. I have not received request from pharmacy. I will make sure his regular prescription gets sent in.

## 2014-10-31 ENCOUNTER — Other Ambulatory Visit: Payer: Self-pay | Admitting: Physician Assistant

## 2014-10-31 ENCOUNTER — Telehealth: Payer: Self-pay | Admitting: *Deleted

## 2014-10-31 MED ORDER — ALPRAZOLAM 1 MG PO TABS: ORAL_TABLET | ORAL | Status: DC

## 2014-10-31 NOTE — Telephone Encounter (Signed)
Called patient to speak with him about son's ADD results; pt informed me that while our baskets and/or request were being handled by another provider, since PCP & myself were out of the office on 09.22.16, he was only given #14 of Alprazolam [7 days] worth of medication. Per provider VO, print script for Alprazolm #60, fill on or after November 03, 2014; faxed to pharmacy/SLS

## 2014-11-11 ENCOUNTER — Other Ambulatory Visit: Payer: Self-pay | Admitting: Physician Assistant

## 2014-11-14 ENCOUNTER — Other Ambulatory Visit: Payer: Self-pay | Admitting: Physician Assistant

## 2014-12-02 ENCOUNTER — Other Ambulatory Visit: Payer: Self-pay | Admitting: Physician Assistant

## 2014-12-05 ENCOUNTER — Other Ambulatory Visit: Payer: Self-pay | Admitting: Physician Assistant

## 2014-12-05 MED ORDER — ALPRAZOLAM 1 MG PO TABS: ORAL_TABLET | ORAL | Status: DC

## 2014-12-06 ENCOUNTER — Telehealth: Payer: Self-pay | Admitting: Physician Assistant

## 2014-12-06 NOTE — Telephone Encounter (Signed)
Caller name:Dayven Relation to LH:TDSK Call back [phone] Pharmacy: [facility]  Reason for call: Pt came in office requesting rx refill for buPROPion (WELLBUTRIN XL) 150 MG 24 hr tablet and metFORMIN (GLUCOPHAGE) 1000 MG tablet. Please advise.

## 2014-12-07 MED ORDER — METFORMIN HCL 1000 MG PO TABS: ORAL_TABLET | ORAL | Status: DC

## 2014-12-07 MED ORDER — BUPROPION HCL ER (XL) 150 MG PO TB24: 150.0000 mg | ORAL_TABLET | Freq: Every day | ORAL | Status: DC

## 2014-12-07 NOTE — Telephone Encounter (Signed)
Called pt and informed the below, pt understood and will make appt for this month later since needs to look at his schedule.

## 2014-12-07 NOTE — Telephone Encounter (Signed)
Refills granted. He is to schedule follow-up appointment this month for Diabetes.

## 2014-12-26 ENCOUNTER — Encounter: Payer: Self-pay | Admitting: Physician Assistant

## 2014-12-26 DIAGNOSIS — E785 Hyperlipidemia, unspecified: Secondary | ICD-10-CM

## 2014-12-26 DIAGNOSIS — R7989 Other specified abnormal findings of blood chemistry: Secondary | ICD-10-CM

## 2014-12-26 DIAGNOSIS — E119 Type 2 diabetes mellitus without complications: Secondary | ICD-10-CM

## 2014-12-26 DIAGNOSIS — Z794 Long term (current) use of insulin: Secondary | ICD-10-CM

## 2014-12-28 ENCOUNTER — Other Ambulatory Visit (INDEPENDENT_AMBULATORY_CARE_PROVIDER_SITE_OTHER): Payer: Federal, State, Local not specified - PPO

## 2014-12-28 DIAGNOSIS — E291 Testicular hypofunction: Secondary | ICD-10-CM | POA: Diagnosis not present

## 2014-12-28 DIAGNOSIS — Z794 Long term (current) use of insulin: Secondary | ICD-10-CM

## 2014-12-28 DIAGNOSIS — E119 Type 2 diabetes mellitus without complications: Secondary | ICD-10-CM

## 2014-12-28 DIAGNOSIS — R7989 Other specified abnormal findings of blood chemistry: Secondary | ICD-10-CM

## 2014-12-28 DIAGNOSIS — E349 Endocrine disorder, unspecified: Secondary | ICD-10-CM

## 2014-12-28 DIAGNOSIS — E785 Hyperlipidemia, unspecified: Secondary | ICD-10-CM

## 2014-12-28 LAB — HEPATIC FUNCTION PANEL
ALK PHOS: 86 U/L (ref 39–117)
ALT: 48 U/L (ref 0–53)
AST: 25 U/L (ref 0–37)
Albumin: 4.4 g/dL (ref 3.5–5.2)
BILIRUBIN DIRECT: 0.1 mg/dL (ref 0.0–0.3)
TOTAL PROTEIN: 7.2 g/dL (ref 6.0–8.3)
Total Bilirubin: 0.3 mg/dL (ref 0.2–1.2)

## 2014-12-28 LAB — BASIC METABOLIC PANEL
BUN: 18 mg/dL (ref 6–23)
CALCIUM: 9.4 mg/dL (ref 8.4–10.5)
CO2: 24 mEq/L (ref 19–32)
CREATININE: 0.82 mg/dL (ref 0.40–1.50)
Chloride: 101 mEq/L (ref 96–112)
GFR: 103.09 mL/min (ref >60.00)
Glucose, Bld: 231 mg/dL — ABNORMAL HIGH (ref 70–99)
Potassium: 4.2 mEq/L (ref 3.5–5.1)
Sodium: 136 mEq/L (ref 135–145)

## 2014-12-28 LAB — HEMOGLOBIN A1C: HEMOGLOBIN A1C: 8.6 % — AB (ref 4.6–6.5)

## 2014-12-28 LAB — TESTOSTERONE: Testosterone: 138.98 ng/dL — ABNORMAL LOW (ref 300.00–890.00)

## 2014-12-28 LAB — LIPID PANEL
CHOL/HDL RATIO: 7
CHOLESTEROL: 205 mg/dL — AB (ref 0–200)
HDL: 29.5 mg/dL — AB (ref >39.00)

## 2014-12-28 LAB — LDL CHOLESTEROL, DIRECT: Direct LDL: 104 mg/dL

## 2015-01-02 ENCOUNTER — Telehealth: Payer: Self-pay | Admitting: *Deleted

## 2015-01-02 MED ORDER — FENOFIBRATE 145 MG PO TABS: 145.0000 mg | ORAL_TABLET | Freq: Every day | ORAL | Status: DC

## 2015-01-02 MED ORDER — BUPROPION HCL ER (XL) 300 MG PO TB24: 300.0000 mg | ORAL_TABLET | Freq: Every day | ORAL | Status: DC

## 2015-01-02 NOTE — Telephone Encounter (Signed)
Patient informed, understood & agreed/SLS  

## 2015-01-02 NOTE — Telephone Encounter (Signed)
-----   Message from Brunetta Jeans, PA-C sent at 01/02/2015  9:06 AM EST ----- Labs are about the same. Testosterone has decreased to 138 from 194. A1C has risen to 8.6. Endocrinology is handling the DM and the testosterone issue. When is his follow-up scheduled? If within next couple of weeks, will defer all to specialist. Otherwise recommend continue Metformin as directed and increase Lantus by 2 units every 3-4 days until sugars averaging 80-120 fasting.

## 2015-01-02 NOTE — Telephone Encounter (Signed)
Patient informed, understood & agreed, he has follow up with Endo on Friday 12/02/016; patient inquired on all Lipid panel results, would like to know if anything besides diet/exercise and continued Simvastatin 40 mg daily is needed for Triglycerides.? Also, patient states that he "can seen remarkable difference in his son since starting Wellbutrin twice a day dosage" and would like to also change his dosage either 150 mg to BID and/or a 300 XR daily dosage, as he is having "some added stress lately". Please Advise/SLS

## 2015-01-02 NOTE — Telephone Encounter (Signed)
I will agree to increase to 300 XR daily. I have sent in prescription. Also continue simvastatin, limit refined sugars and alcohol. I have sent in short-term prescription for fenofibrate to lower TGL. Take once daily.  Follow-up with me in 1 month.

## 2015-01-06 ENCOUNTER — Other Ambulatory Visit: Payer: Self-pay

## 2015-01-06 ENCOUNTER — Encounter: Payer: Self-pay | Admitting: Endocrinology

## 2015-01-06 ENCOUNTER — Ambulatory Visit (INDEPENDENT_AMBULATORY_CARE_PROVIDER_SITE_OTHER): Payer: Federal, State, Local not specified - PPO | Admitting: Endocrinology

## 2015-01-06 VITALS — BP 136/92 | HR 84 | Temp 98.1°F | Ht 72.0 in | Wt 224.0 lb

## 2015-01-06 DIAGNOSIS — E291 Testicular hypofunction: Secondary | ICD-10-CM

## 2015-01-06 DIAGNOSIS — E349 Endocrine disorder, unspecified: Secondary | ICD-10-CM

## 2015-01-06 NOTE — Patient Instructions (Addendum)
blood tests are requested for you today.  We'll let you know about the results.   If it is low, I can prescribe for you a pill for this.

## 2015-01-06 NOTE — Progress Notes (Signed)
 Subjective:    Patient ID: William [name], male    DOB: 06/18/1960, 54 y.o.   MRN: [id number]  HPI Pt returns for f/u of low testosterone (dx'ed 2013; he has 3 biological children; he took androgel 2013-2015; on this, symptoms and testosterone level improved only temporarily; he then took testosterone injections from 2013-2016, until he stopped to allow further evaluation; in 2016, he was advised not to take rx of this, due to normal free testosterone).  He continues to have fatigue.   Past Medical History  Diagnosis Date  . Asthma     history  . Arthritis   . History of chicken pox   . Adjustment disorder with mixed anxiety and depressed mood   . Diabetes mellitus     type 2  . Elevated blood pressure     history of high blood pressure readings  . Hypercholesteremia     Past Surgical History  Procedure Laterality Date  . Nasal septum surgery      1992  . Knee surgery      right knee new ACL- 1993,2000  . Knee surgery      left knee 2003  . Partial hip arthroplasty      right hip replacement  . Elbow surgery      right elbow 2010, left elbow 08/06/10 (Bryan Jennings)    Social History   Social History  . Marital Status: Married    Spouse Name: N/A  . Number of Children: N/A  . Years of Education: N/A   Occupational History  . Not on file.   Social History Main Topics  . Smoking status: Never Smoker   . Smokeless tobacco: Never Used  . Alcohol Use: Not on file  . Drug Use: Not on file  . Sexual Activity: Not on file   Other Topics Concern  . Not on file   Social History Narrative   Occupation: Real Estate Broker   Married -15 marriage (2nd marriage)   daughter 26,  2 sons (2nd marriage  11,6)   [place]   Never Smoked    Alcohol use-yes   Drug use-no    Regular exercise-no   Smoking Status:  never   Does Patient Exercise:  no   Caffeine use/day:  3-4 cups coffee daily   Drug Use:  no          Current Outpatient Prescriptions on File Prior to Visit    Medication Sig Dispense Refill  . ALPRAZolam (XANAX) 1 MG tablet TAKE 1 TO 2 TABLETS BY MOUTH AT BEDTIME AS NEEDED FOR SLEEP AND ANXIETY 60 tablet 2  . aspirin 81 MG tablet Take 81 mg by mouth daily.    . Blood Glucose Monitoring Suppl (ONE TOUCH ULTRA SYSTEM KIT) W/DEVICE KIT 1 kit by Does not apply route once. Use to check blood sugar four times daily     . buPROPion (WELLBUTRIN XL) 300 MG 24 hr tablet Take 1 tablet (300 mg total) by mouth daily. 30 tablet 1  . fenofibrate (TRICOR) 145 MG tablet Take 1 tablet (145 mg total) by mouth daily. 30 tablet 3  . glucose blood (ACCU-CHEK AVIVA) test strip Use to check blood sugar 8 times per day. 300 each 2  . glucose blood (ACCU-CHEK COMPACT STRIPS) test strip Use as instructed to check blood sugar eight times a day. 300 each 2  . Insulin Glargine (LANTUS SOLOSTAR) 100 UNIT/ML Solostar Pen Inject 85 Units into the skin daily at 10 pm. 90   mL 3  . Insulin Pen Needle (RELION PEN NEEDLE 31G/8MM) 31G X 8 MM MISC Use as Directed daily to check blood sugars 100 each 3  . Insulin Syringe-Needle U-100 30G 1 ML MISC USE AS DIRECTED TO INJECT INSULIN DX: 250.00 100 each 3  . metFORMIN (GLUCOPHAGE) 1000 MG tablet TAKE 1 TABLET (1,000 MG) BY MOUTH 2 TIMES DAILY WITH A MEAL. 60 tablet 0  . omeprazole (PRILOSEC) 20 MG capsule TAKE 1 CAPSULE BY MOUTH DAILY. 30 capsule 5  . ONE TOUCH LANCETS MISC Use as directed 200 each 11  . ramipril (ALTACE) 10 MG capsule TAKE 2 CAPSULES BY MOUTH DAILY 60 capsule 5  . sertraline (ZOLOFT) 50 MG tablet TAKE 1&1/2 TABLETS (75 MG TOTAL) BY MOUTH DAILY. 45 tablet 5  . simvastatin (ZOCOR) 40 MG tablet TAKE 1 TABLET BY MOUTH AT BEDTIME 30 tablet 6  . VIAGRA 100 MG tablet TAKE 1/2 TO 1 TABLET (50-100 MG TOTAL) BY MOUTH DAILY AS NEEDED FOR ERECTILE DYSFUNCTION. 4 tablet 2   No current facility-administered medications on file prior to visit.    No Known Allergies  Family History  Problem Relation Age of Onset  . Diabetes Mother   .  Thyroid disease Mother     Questionable  . Heart disease Father     deceased  . Other Neg Hx     hypogonadism    BP 136/92 mmHg  Pulse 84  Temp(Src) 98.1 F (36.7 C) (Oral)  Ht 6' (1.829 m)  Wt 224 lb (101.606 kg)  BMI 30.37 kg/m2  SpO2 94%  Review of Systems He has decreased urinary stream.  He still has ED sxs.      Objective:   Physical Exam VITAL SIGNS:  See vs page GENERAL: no distress GENITALIA: Normal male testicles, scrotum, and penis.        Assessment & Plan:  Low total testosterone.  We'll recheck the free testosterone. ED: not related to testosterone level.  Patient is advised the following: Patient Instructions  blood tests are requested for you today.  We'll let you know about the results.   If it is low, I can prescribe for you a pill for this.

## 2015-01-08 LAB — TESTOSTERONE,FREE AND TOTAL
TESTOSTERONE: 128 ng/dL — AB (ref 348–1197)
Testosterone, Free: 8.7 pg/mL (ref 7.2–24.0)

## 2015-02-06 ENCOUNTER — Encounter: Payer: Self-pay | Admitting: Endocrinology

## 2015-02-06 MED FILL — FENOFIBRATE 145 MG TABLET: 145 | 30 days supply | Qty: 30 | Fill #1

## 2015-02-07 ENCOUNTER — Other Ambulatory Visit: Payer: Self-pay | Admitting: Endocrinology

## 2015-02-07 DIAGNOSIS — E349 Endocrine disorder, unspecified: Secondary | ICD-10-CM

## 2015-03-01 ENCOUNTER — Encounter: Payer: Self-pay | Admitting: Physician Assistant

## 2015-03-01 ENCOUNTER — Encounter: Payer: Self-pay | Admitting: Endocrinology

## 2015-03-01 ENCOUNTER — Other Ambulatory Visit: Payer: Self-pay | Admitting: Endocrinology

## 2015-03-01 DIAGNOSIS — E349 Endocrine disorder, unspecified: Secondary | ICD-10-CM

## 2015-03-01 DIAGNOSIS — E291 Testicular hypofunction: Secondary | ICD-10-CM

## 2015-03-01 MED ORDER — CLOMIPHENE CITRATE 50 MG PO TABS: ORAL_TABLET | ORAL | Status: DC

## 2015-03-02 ENCOUNTER — Encounter: Payer: Self-pay | Admitting: Endocrinology

## 2015-03-02 ENCOUNTER — Telehealth: Payer: Self-pay | Admitting: Endocrinology

## 2015-03-02 NOTE — Telephone Encounter (Signed)
please call patient: We got PA form for PA. i can do it, but it is unlikely to be approved, and it is cheap to buy at [facility] Please let me know

## 2015-03-02 NOTE — Telephone Encounter (Signed)
I contacted the pt. He is requesting a Mychart message from the MD stating why he needs to take this medication, the benefits he will have from taking the medication, and if its critical he have this medication. After the pt receives the message via my chart he is going to contact his insurance and see why they will not pay for it.  Pt did not want to purchase out of pocket at this time. Please advise.

## 2015-03-03 ENCOUNTER — Telehealth: Payer: Self-pay | Admitting: *Deleted

## 2015-03-03 ENCOUNTER — Ambulatory Visit (INDEPENDENT_AMBULATORY_CARE_PROVIDER_SITE_OTHER): Payer: Federal, State, Local not specified - PPO | Admitting: Physician Assistant

## 2015-03-03 ENCOUNTER — Encounter: Payer: Self-pay | Admitting: Physician Assistant

## 2015-03-03 ENCOUNTER — Other Ambulatory Visit: Payer: Self-pay | Admitting: Physician Assistant

## 2015-03-03 ENCOUNTER — Ambulatory Visit: Payer: Federal, State, Local not specified - PPO | Admitting: Physician Assistant

## 2015-03-03 VITALS — BP 124/80 | HR 69 | Temp 97.8°F | Ht 72.0 in | Wt 226.0 lb

## 2015-03-03 DIAGNOSIS — K649 Unspecified hemorrhoids: Secondary | ICD-10-CM

## 2015-03-03 MED ORDER — HYDROCORTISONE ACETATE 25 MG RE SUPP: 25.0000 mg | Freq: Two times a day (BID) | RECTAL | Status: DC

## 2015-03-03 MED ORDER — SERTRALINE HCL 100 MG PO TABS: 100.0000 mg | ORAL_TABLET | Freq: Every day | ORAL | Status: DC

## 2015-03-03 MED ORDER — ALPRAZOLAM 1 MG PO TABS: ORAL_TABLET | ORAL | Status: DC

## 2015-03-03 MED FILL — BUPROPION HCL XL 300 MG TAB: 300 | 30 days supply | Qty: 30 | Fill #0

## 2015-03-03 MED FILL — SERTRALINE HCL 100 MG TAB: 100 | 30 days supply | Qty: 30 | Fill #0

## 2015-03-03 MED FILL — metFORMIN HCL 1000 MG TABS: 1000 | 30 days supply | Qty: 60 | Fill #0

## 2015-03-03 MED FILL — RAMIPRIL 10 MG CAPSULE: 10 | 30 days supply | Qty: 60 | Fill #3

## 2015-03-03 MED FILL — ALPRAZolam 1 MG TABS: 1 | 30 days supply | Qty: 60 | Fill #0

## 2015-03-03 MED FILL — ANUCORT-HC 25 MG SUPPOSITOR: 25 | 6 days supply | Qty: 12 | Fill #0

## 2015-03-03 MED FILL — SIMVASTATIN 40 MG TABLET: 40 | 30 days supply | Qty: 30 | Fill #5

## 2015-03-03 NOTE — Patient Instructions (Signed)
I encourage you to increase hydration and the amount of fiber in your diet.  Start a daily probiotic (Align, Culturelle, Digestive Advantage, etc.). If no bowel movement within 24 hours, take 2 Tbs of Milk of Magnesia in a 4 oz glass of warmed prune juice every 2-3 days to help promote bowel movement. If no results within 24 hours, then repeat above regimen, adding a Dulcolax stool softener to regimen. If this does not promote a bowel movement, please call the office.  Please take the steroid as directed. You will be contacted for a screening colonoscopy.  Ask the pharmacy to send a PA for the clomiphene (Clomid) to Dr. Loanne Drilling

## 2015-03-03 NOTE — Telephone Encounter (Signed)
Also received request for Alprazolam refill, Ok per verbal order by provider to phone and/or fax in; Alprazolam &n new dosage Sertraline phoned in to [place] 01/27

## 2015-03-03 NOTE — Telephone Encounter (Signed)
Check but I belief I already ok'd an order for a 100 mg tablet. If not, ok to send in.

## 2015-03-03 NOTE — Telephone Encounter (Signed)
Patient request to have his Sertraline increased to a 100 mg tablet once daily, he states he will be due for refill soon and would like to make this change before then/SLS 01/27 Please Advise.

## 2015-03-03 NOTE — Telephone Encounter (Signed)
Refilled patients metformin for #60 with 0 refills last office visit 03-03-15 refilled #30 wellbutin with 0rf

## 2015-03-03 NOTE — Progress Notes (Signed)
Pre visit review using our clinic review tool, if applicable. No additional management support is needed unless otherwise documented below in the visit note. 

## 2015-03-03 NOTE — Telephone Encounter (Signed)
Pt called office and wanted to schedule an appt with [facility].  Scheduling added pt to the 11:30 spot. Provider meeting today.   I called pt and requested him to come in at 10:45am. Einar Pheasant has added a hold to sched for the 10:45 slot.

## 2015-03-03 NOTE — Progress Notes (Signed)
Patient presents to clinic today c/o BRBPR infrequently over the past month. Denies ractal pain or trauma. Denies melena. Has history of hemorrhoids. Endorses occasional hard stools.  Past Medical History  Diagnosis Date  . Asthma     history  . Arthritis   . History of chicken pox   . Adjustment disorder with mixed anxiety and depressed mood   . Diabetes mellitus     type 2  . Elevated blood pressure     history of high blood pressure readings  . Hypercholesteremia     Current Outpatient Prescriptions on File Prior to Visit  Medication Sig Dispense Refill  . aspirin 81 MG tablet Take 81 mg by mouth daily.    . Blood Glucose Monitoring Suppl (ONE TOUCH ULTRA SYSTEM KIT) W/DEVICE KIT 1 kit by Does not apply route once. Use to check blood sugar four times daily     . fenofibrate (TRICOR) 145 MG tablet Take 1 tablet (145 mg total) by mouth daily. 30 tablet 3  . glucose blood (ACCU-CHEK AVIVA) test strip Use to check blood sugar 8 times per day. 300 each 2  . glucose blood (ACCU-CHEK COMPACT STRIPS) test strip Use as instructed to check blood sugar eight times a day. 300 each 2  . Insulin Glargine (LANTUS SOLOSTAR) 100 UNIT/ML Solostar Pen Inject 85 Units into the skin daily at 10 pm. 90 mL 3  . Insulin Pen Needle (RELION PEN NEEDLE 31G/8MM) 31G X 8 MM MISC Use as Directed daily to check blood sugars 100 each 3  . Insulin Syringe-Needle U-100 30G 1 ML MISC USE AS DIRECTED TO INJECT INSULIN DX: 250.00 100 each 3  . omeprazole (PRILOSEC) 20 MG capsule TAKE 1 CAPSULE BY MOUTH DAILY. 30 capsule 5  . ONE TOUCH LANCETS MISC Use as directed 200 each 11  . ramipril (ALTACE) 10 MG capsule TAKE 2 CAPSULES BY MOUTH DAILY 60 capsule 5  . simvastatin (ZOCOR) 40 MG tablet TAKE 1 TABLET BY MOUTH AT BEDTIME 30 tablet 6  . VIAGRA 100 MG tablet TAKE 1/2 TO 1 TABLET (50-100 MG TOTAL) BY MOUTH DAILY AS NEEDED FOR ERECTILE DYSFUNCTION. 4 tablet 2   No current facility-administered medications on file  prior to visit.    No Known Allergies  Family History  Problem Relation Age of Onset  . Diabetes Mother   . Thyroid disease Mother     Questionable  . Heart disease Father     deceased  . Other Neg Hx     hypogonadism    Social History   Social History  . Marital Status: Married    Spouse Name: N/A  . Number of Children: N/A  . Years of Education: N/A   Social History Main Topics  . Smoking status: Never Smoker   . Smokeless tobacco: Never Used  . Alcohol Use: None  . Drug Use: None  . Sexual Activity: Not Asked   Other Topics Concern  . None   Social History Narrative   Occupation: Engineer, site   Married -10 marriage (2nd marriage)   daughter 51,  2 sons (2nd marriage  11,6)   [place]   Never Smoked    Alcohol use-yes   Drug use-no    Regular exercise-no   Smoking Status:  never   Does Patient Exercise:  no   Caffeine use/day:  3-4 cups coffee daily   Drug Use:  no          Review of Systems -  See HPI.  All other ROS are negative.  BP 124/80 mmHg  Pulse 69  Temp(Src) 97.8 F (36.6 C) (Oral)  Ht 6' (1.829 m)  Wt 226 lb (102.513 kg)  BMI 30.64 kg/m2  SpO2 98%  Physical Exam  Constitutional: He is oriented to person, place, and time and well-developed, well-nourished, and in no distress.  HENT:  Head: Normocephalic and atraumatic.  Cardiovascular: Normal rate, regular rhythm, normal heart sounds and intact distal pulses.   Pulmonary/Chest: Effort normal and breath sounds normal.  Genitourinary: Rectal exam shows external hemorrhoid. Rectal exam shows no fissure.  Neurological: He is alert and oriented to person, place, and time.  Skin: Skin is warm and dry. No rash noted.  Vitals reviewed.   Recent Results (from the past 2160 hour(s))  Hepatic function panel     Status: None   Collection Time: 12/28/14  1:28 PM  Result Value Ref Range   Total Bilirubin 0.3 0.2 - 1.2 mg/dL   Bilirubin, Direct 0.1 0.0 - 0.3 mg/dL   Alkaline Phosphatase 86  39 - 117 U/L   AST 25 0 - 37 U/L   ALT 48 0 - 53 U/L   Total Protein 7.2 6.0 - 8.3 g/dL   Albumin 4.4 3.5 - 5.2 g/dL  Basic Metabolic Panel (BMET)     Status: Abnormal   Collection Time: 12/28/14  1:28 PM  Result Value Ref Range   Sodium 136 135 - 145 mEq/L   Potassium 4.2 3.5 - 5.1 mEq/L   Chloride 101 96 - 112 mEq/L   CO2 24 19 - 32 mEq/L   Glucose, Bld 231 (H) 70 - 99 mg/dL   BUN 18 6 - 23 mg/dL   Creatinine, Ser 0.82 0.40 - 1.50 mg/dL   Calcium 9.4 8.4 - 10.5 mg/dL   GFR 103.09 >60.00 mL/min  Hemoglobin A1c     Status: Abnormal   Collection Time: 12/28/14  1:28 PM  Result Value Ref Range   Hgb A1c MFr Bld 8.6 (H) 4.6 - 6.5 %    Comment: Glycemic Control Guidelines for People with Diabetes:Non Diabetic:  <6%Goal of Therapy: <7%Additional Action Suggested:  >8%   Lipid Profile     Status: Abnormal   Collection Time: 12/28/14  1:28 PM  Result Value Ref Range   Cholesterol 205 (H) 0 - 200 mg/dL    Comment: ATP III Classification       Desirable:  < 200 mg/dL               Borderline High:  200 - 239 mg/dL          High:  > = 240 mg/dL   Triglycerides (H) 0.0 - 149.0 mg/dL    472.0 Triglyceride is over 400; calculations on Lipids are invalid.    Comment: Normal:  <150 mg/dLBorderline High:  150 - 199 mg/dL   HDL 29.50 (L) >39.00 mg/dL   Total CHOL/HDL Ratio 7     Comment:                Men          Women1/2 Average Risk     3.4          3.3Average Risk          5.0          4.42X Average Risk          9.6          7.13X Average Risk  15.0          11.0                      Testosterone     Status: Abnormal   Collection Time: 12/28/14  1:28 PM  Result Value Ref Range   Testosterone 138.98 (L) 300.00 - 890.00 ng/dL  LDL cholesterol, direct     Status: None   Collection Time: 12/28/14  1:28 PM  Result Value Ref Range   Direct LDL 104.0 mg/dL    Comment: Optimal:  <100 mg/dLNear or Above Optimal:  100-129 mg/dLBorderline High:  130-159 mg/dLHigh:  160-189 mg/dLVery High:   >190 mg/dL  Testosterone,Free and Total     Status: Abnormal   Collection Time: 01/06/15 11:40 AM  Result Value Ref Range   Testosterone 128 (L) 348 - 1197 ng/dL   Comment, Testosterone Comment     Comment: Adult male reference interval is based on a population of lean males up to 57 years old.    Testosterone, Free 8.7 7.2 - 24.0 pg/mL    Assessment/Plan: Hemorrhoid Rx Anucort-HC suppositories. Bowel regimen given. Referral to GI placed for hemorrhoids and for screening colonoscopy.

## 2015-03-05 NOTE — Assessment & Plan Note (Signed)
Rx Anucort-HC suppositories. Bowel regimen given. Referral to GI placed for hemorrhoids and for screening colonoscopy.

## 2015-04-05 ENCOUNTER — Other Ambulatory Visit: Payer: Self-pay | Admitting: Physician Assistant

## 2015-04-05 MED FILL — SIMVASTATIN 40 MG TABLET: 40 | 30 days supply | Qty: 30 | Fill #6

## 2015-04-05 MED FILL — SERTRALINE HCL 100 MG TAB: 100 | 30 days supply | Qty: 30 | Fill #1

## 2015-04-05 MED FILL — ALPRAZolam 1 MG TABS: 1 | 30 days supply | Qty: 60 | Fill #1

## 2015-04-05 MED FILL — BUPROPION HCL XL 300 MG TAB: 300 | 30 days supply | Qty: 30 | Fill #1

## 2015-04-05 MED FILL — metFORMIN HCL 1000 MG TABS: 1000 | 30 days supply | Qty: 60 | Fill #0

## 2015-04-05 MED FILL — RAMIPRIL 10 MG CAPSULE: 10 | 30 days supply | Qty: 60 | Fill #4

## 2015-04-07 ENCOUNTER — Encounter: Payer: Self-pay | Admitting: Internal Medicine

## 2015-05-04 ENCOUNTER — Other Ambulatory Visit: Payer: Self-pay | Admitting: Physician Assistant

## 2015-05-04 MED FILL — ALPRAZolam 1 MG TABS: 1 | 30 days supply | Qty: 60 | Fill #2

## 2015-05-04 MED FILL — RAMIPRIL 10 MG CAPSULE: 10 | 30 days supply | Qty: 60 | Fill #5

## 2015-05-04 MED FILL — FENOFIBRATE 145 MG TABLET: 145 | 30 days supply | Qty: 30 | Fill #2

## 2015-05-04 MED FILL — SERTRALINE HCL 100 MG TAB: 100 | 30 days supply | Qty: 30 | Fill #2

## 2015-05-15 MED FILL — metFORMIN HCL 1000 MG TABS: 1000 | 30 days supply | Qty: 60 | Fill #0

## 2015-05-15 MED FILL — BUPROPION HCL XL 300 MG TAB: 300 | 30 days supply | Qty: 30 | Fill #0

## 2015-05-22 ENCOUNTER — Ambulatory Visit (INDEPENDENT_AMBULATORY_CARE_PROVIDER_SITE_OTHER): Payer: Federal, State, Local not specified - PPO | Admitting: Physician Assistant

## 2015-05-22 ENCOUNTER — Other Ambulatory Visit: Payer: Self-pay | Admitting: Physician Assistant

## 2015-05-22 ENCOUNTER — Encounter: Payer: Self-pay | Admitting: Physician Assistant

## 2015-05-22 ENCOUNTER — Telehealth: Payer: Self-pay | Admitting: Physician Assistant

## 2015-05-22 VITALS — BP 132/90 | HR 80 | Temp 98.0°F | Ht 72.0 in | Wt 222.1 lb

## 2015-05-22 DIAGNOSIS — E291 Testicular hypofunction: Secondary | ICD-10-CM

## 2015-05-22 DIAGNOSIS — E785 Hyperlipidemia, unspecified: Secondary | ICD-10-CM | POA: Diagnosis not present

## 2015-05-22 DIAGNOSIS — W5501XA Bitten by cat, initial encounter: Secondary | ICD-10-CM

## 2015-05-22 DIAGNOSIS — R7989 Other specified abnormal findings of blood chemistry: Secondary | ICD-10-CM

## 2015-05-22 MED ORDER — BASAGLAR KWIKPEN 100 UNIT/ML ~~LOC~~ SOPN: 85.0000 [IU] | PEN_INJECTOR | Freq: Every day | SUBCUTANEOUS | Status: DC

## 2015-05-22 MED ORDER — AMOXICILLIN-POT CLAVULANATE 875-125 MG PO TABS: 1.0000 | ORAL_TABLET | Freq: Two times a day (BID) | ORAL | Status: DC

## 2015-05-22 MED FILL — AMOX-CLAV 875-125 MG TABLET: 875-125 | 7 days supply | Qty: 14 | Fill #0

## 2015-05-22 NOTE — Patient Instructions (Addendum)
Please take the antibiotic twice daily with food. Keep area clean and dry. Call if symptoms are not continuing to improve.  Please go to the lab for blood work. I will call with results. We will alter your regimen accordingly.

## 2015-05-22 NOTE — Progress Notes (Signed)
Pre visit review using our clinic review tool, if applicable. No additional management support is needed unless otherwise documented below in the visit note. 

## 2015-05-22 NOTE — Progress Notes (Signed)
Patient presents to clinic today c/o bite to the R index finger yesterday evening from his cat Roscoe. No concern for rabies. Denies fever, chills. Notes tenderness and redness at the site. Denies pustular drainage.   Past Medical History  Diagnosis Date  . Asthma     history  . Arthritis   . History of chicken pox   . Adjustment disorder with mixed anxiety and depressed mood   . Diabetes mellitus     type 2  . Elevated blood pressure     history of high blood pressure readings  . Hypercholesteremia     Current Outpatient Prescriptions on File Prior to Visit  Medication Sig Dispense Refill  . ALPRAZolam (XANAX) 1 MG tablet TAKE 1 TO 2 TABLETS BY MOUTH AT BEDTIME AS NEEDED FOR SLEEP AND ANXIETY 60 tablet 2  . aspirin 81 MG tablet Take 81 mg by mouth daily.    . Blood Glucose Monitoring Suppl (ONE TOUCH ULTRA SYSTEM KIT) W/DEVICE KIT 1 kit by Does not apply route once. Use to check blood sugar four times daily     . buPROPion (WELLBUTRIN XL) 300 MG 24 hr tablet TAKE 1 TABLET (300 MG TOTAL) BY MOUTH DAILY. 30 tablet 5  . fenofibrate (TRICOR) 145 MG tablet Take 1 tablet (145 mg total) by mouth daily. 30 tablet 3  . glucose blood (ACCU-CHEK AVIVA) test strip Use to check blood sugar 8 times per day. 300 each 2  . glucose blood (ACCU-CHEK COMPACT STRIPS) test strip Use as instructed to check blood sugar eight times a day. 300 each 2  . hydrocortisone (ANUSOL-HC) 25 MG suppository Place 1 suppository (25 mg total) rectally 2 (two) times daily. 12 suppository 0  . Insulin Pen Needle (RELION PEN NEEDLE 31G/8MM) 31G X 8 MM MISC Use as Directed daily to check blood sugars 100 each 3  . Insulin Syringe-Needle U-100 30G 1 ML MISC USE AS DIRECTED TO INJECT INSULIN DX: 250.00 100 each 3  . metFORMIN (GLUCOPHAGE) 1000 MG tablet TAKE 1 TABLET (1,000 MG) BY MOUTH 2 TIMES DAILY WITH A MEAL. *PT MUST MAKE APPT BEFORE ANY MORE REFILLS* 60 tablet 0  . omeprazole (PRILOSEC) 20 MG capsule TAKE 1 CAPSULE  BY MOUTH DAILY. 30 capsule 5  . ONE TOUCH LANCETS MISC Use as directed 200 each 11  . ramipril (ALTACE) 10 MG capsule TAKE 2 CAPSULES BY MOUTH DAILY 60 capsule 5  . sertraline (ZOLOFT) 100 MG tablet Take 1 tablet (100 mg total) by mouth daily. 30 tablet 2  . simvastatin (ZOCOR) 40 MG tablet TAKE 1 TABLET BY MOUTH AT BEDTIME 30 tablet 6  . VIAGRA 100 MG tablet TAKE 1/2 TO 1 TABLET (50-100 MG TOTAL) BY MOUTH DAILY AS NEEDED FOR ERECTILE DYSFUNCTION. 4 tablet 2   No current facility-administered medications on file prior to visit.    No Known Allergies  Family History  Problem Relation Age of Onset  . Diabetes Mother   . Thyroid disease Mother     Questionable  . Heart disease Father     deceased  . Other Neg Hx     hypogonadism    Social History   Social History  . Marital Status: Married    Spouse Name: N/A  . Number of Children: N/A  . Years of Education: N/A   Social History Main Topics  . Smoking status: Never Smoker   . Smokeless tobacco: Never Used  . Alcohol Use: None  . Drug Use: None  .  Sexual Activity: Not Asked   Other Topics Concern  . 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  All other ROS are negative.  BP 132/90 mmHg  Pulse 80  Temp(Src) 98 F (36.7 C) (Oral)  Ht 6' (1.829 m)  Wt 222 lb 2 oz (100.755 kg)  BMI 30.12 kg/m2  SpO2 94%  Physical Exam  Constitutional: He is oriented to person, place, and time and well-developed, well-nourished, and in no distress.  HENT:  Head: Normocephalic and atraumatic.  Cardiovascular: Normal rate, regular rhythm, normal heart sounds and intact distal pulses.     Pulmonary/Chest: Effort normal and breath sounds normal. No respiratory distress. He has no wheezes. He has no rales. He exhibits no tenderness.  Neurological: He is alert and oriented to person, place, and time.  Skin:     Vitals reviewed.   No results found for this or any previous visit (from the past 2160 hour(s)).  Assessment/Plan: Cat bite Rx Augmentin to take BID x 7 days giving chance of animal puncture wound to become infected. Supportive measures reviewed. Follow-up as directed. Alarm signs/symptoms reviewed that would prompt ER assessment.

## 2015-05-22 NOTE — Telephone Encounter (Signed)
Done. Thanks.

## 2015-05-22 NOTE — Telephone Encounter (Signed)
Caller name: Pharmacy Relationship to patient: Can be reached: [phone] Pharmacy:  Reason for call: Request 90 day supply of Insulin Glargine [facility] KWIKPEN) 100 UNIT/ML SOPN [id number]

## 2015-05-23 DIAGNOSIS — W5501XA Bitten by cat, initial encounter: Secondary | ICD-10-CM | POA: Insufficient documentation

## 2015-05-23 LAB — TESTOSTERONE TOTAL,FREE,BIO, MALES
ALBUMIN: 4.6 g/dL (ref 3.6–5.1)
SEX HORMONE BINDING: 10 nmol/L — AB (ref 22–77)
TESTOSTERONE: 355 ng/dL (ref 250–827)
Testosterone, Bioavailable: 207.7 ng/dL (ref 130.5–681.7)
Testosterone, Free: 98.9 pg/mL (ref 47.0–244.0)

## 2015-05-23 LAB — LIPID PANEL
Cholesterol: 261 mg/dL — ABNORMAL HIGH (ref 0–200)
HDL: 32.3 mg/dL — ABNORMAL LOW (ref >39.00)
NONHDL: 228.49
TRIGLYCERIDES: 307 mg/dL — AB (ref 0.0–149.0)
Total CHOL/HDL Ratio: 8
VLDL: 61.4 mg/dL — ABNORMAL HIGH (ref 0.0–40.0)

## 2015-05-23 LAB — COMPREHENSIVE METABOLIC PANEL
ALT: 48 U/L (ref 0–53)
AST: 30 U/L (ref 0–37)
Albumin: 4.5 g/dL (ref 3.5–5.2)
Alkaline Phosphatase: 63 U/L (ref 39–117)
BILIRUBIN TOTAL: 0.3 mg/dL (ref 0.2–1.2)
BUN: 16 mg/dL (ref 6–23)
CALCIUM: 9.5 mg/dL (ref 8.4–10.5)
CO2: 30 meq/L (ref 19–32)
CREATININE: 0.84 mg/dL (ref 0.40–1.50)
Chloride: 103 mEq/L (ref 96–112)
GFR: 100.12 mL/min (ref >60.00)
GLUCOSE: 150 mg/dL — AB (ref 70–99)
Potassium: 4.2 mEq/L (ref 3.5–5.1)
Sodium: 138 mEq/L (ref 135–145)
Total Protein: 7.3 g/dL (ref 6.0–8.3)

## 2015-05-23 LAB — HEMOGLOBIN A1C: HEMOGLOBIN A1C: 9.5 % — AB (ref 4.6–6.5)

## 2015-05-23 LAB — LDL CHOLESTEROL, DIRECT: Direct LDL: 175 mg/dL

## 2015-05-23 NOTE — Assessment & Plan Note (Signed)
Rx Augmentin to take BID x 7 days giving chance of animal puncture wound to become infected. Supportive measures reviewed. Follow-up as directed. Alarm signs/symptoms reviewed that would prompt ER assessment.

## 2015-05-26 LAB — PSA: PSA: 0.39 ng/mL (ref <=4.00)

## 2015-05-31 DIAGNOSIS — M545 Low back pain: Secondary | ICD-10-CM | POA: Diagnosis not present

## 2015-05-31 DIAGNOSIS — M62830 Muscle spasm of back: Secondary | ICD-10-CM | POA: Diagnosis not present

## 2015-05-31 DIAGNOSIS — M5416 Radiculopathy, lumbar region: Secondary | ICD-10-CM | POA: Diagnosis not present

## 2015-05-31 DIAGNOSIS — M9903 Segmental and somatic dysfunction of lumbar region: Secondary | ICD-10-CM | POA: Diagnosis not present

## 2015-06-02 MED FILL — BASAGLAR 100 UNIT/ML KWIKPE: 100 | 89 days supply | Qty: 75 | Fill #0

## 2015-06-05 ENCOUNTER — Other Ambulatory Visit: Payer: Self-pay | Admitting: Physician Assistant

## 2015-06-05 MED ORDER — ATORVASTATIN CALCIUM 40 MG PO TABS
40.0000 mg | ORAL_TABLET | Freq: Every day | ORAL | Status: DC
Start: 2015-06-05 — End: 2015-10-05

## 2015-06-05 MED FILL — RAMIPRIL 10 MG CAPSULE: 10 | 30 days supply | Qty: 60 | Fill #0

## 2015-06-05 MED FILL — ALPRAZolam 1 MG TABS: 1 | 30 days supply | Qty: 60 | Fill #0

## 2015-06-05 MED FILL — ATORVASTATIN 40 MG TABLET: 40 | 30 days supply | Qty: 30 | Fill #0

## 2015-06-05 MED FILL — SIMVASTATIN 40 MG TABLET: 40 | 30 days supply | Qty: 30 | Fill #0

## 2015-06-05 MED FILL — FENOFIBRATE 145 MG TABLET: 145 | 30 days supply | Qty: 30 | Fill #3

## 2015-06-05 MED FILL — SERTRALINE HCL 100 MG TAB: 100 | 30 days supply | Qty: 30 | Fill #0

## 2015-06-05 NOTE — Telephone Encounter (Signed)
Rx request to pharmacy/SLS  

## 2015-06-05 NOTE — Addendum Note (Signed)
Addended by: Tasia Catchings on: 06/05/2015 05:45 PM   Modules accepted: Orders, Medications

## 2015-06-07 MED FILL — BUPROPION HCL XL 300 MG TAB: 300 | 30 days supply | Qty: 30 | Fill #1

## 2015-06-14 DIAGNOSIS — M545 Low back pain: Secondary | ICD-10-CM | POA: Diagnosis not present

## 2015-06-14 DIAGNOSIS — M9903 Segmental and somatic dysfunction of lumbar region: Secondary | ICD-10-CM | POA: Diagnosis not present

## 2015-06-14 DIAGNOSIS — M62830 Muscle spasm of back: Secondary | ICD-10-CM | POA: Diagnosis not present

## 2015-06-14 DIAGNOSIS — M5416 Radiculopathy, lumbar region: Secondary | ICD-10-CM | POA: Diagnosis not present

## 2015-07-05 ENCOUNTER — Other Ambulatory Visit: Payer: Self-pay | Admitting: Physician Assistant

## 2015-07-05 MED FILL — ATORVASTATIN 40 MG TABLET: 40 | 30 days supply | Qty: 30 | Fill #1

## 2015-07-05 MED FILL — RAMIPRIL 10 MG CAPSULE: 10 | 30 days supply | Qty: 60 | Fill #1

## 2015-07-05 MED FILL — SERTRALINE HCL 100 MG TAB: 100 | 30 days supply | Qty: 30 | Fill #1

## 2015-07-05 MED FILL — SIMVASTATIN 40 MG TABLET: 40 | 30 days supply | Qty: 30 | Fill #1

## 2015-07-05 MED FILL — BUPROPION HCL XL 300 MG TAB: 300 | 30 days supply | Qty: 30 | Fill #2

## 2015-07-05 NOTE — Telephone Encounter (Signed)
Rx for Alprazolam Denied, too soon, last Rx 06/05/15, #60 with [2] refills; [facility] with contact name and number for [facility] that if there was a reason they were needing a new Rx, that it woould be Ok to authorize under my name tomorrow, as provider & myself will be out of the office until Friday, 07/07/15.Marland KitchenSLS 05/31

## 2015-07-06 MED FILL — metFORMIN HCL 1000 MG TABS: 1000 | 30 days supply | Qty: 60 | Fill #0

## 2015-07-06 MED FILL — FENOFIBRATE 145 MG TABLET: 145 | 30 days supply | Qty: 30 | Fill #0

## 2015-07-06 MED FILL — ALPRAZolam 1 MG TABS: 1 | 30 days supply | Qty: 60 | Fill #1

## 2015-07-07 ENCOUNTER — Telehealth: Payer: Self-pay | Admitting: *Deleted

## 2015-07-07 MED ORDER — SILVER SULFADIAZINE 1 % EX CREA: 1.0000 "application " | TOPICAL_CREAM | Freq: Every day | CUTANEOUS | Status: DC | PRN

## 2015-07-07 MED FILL — SSD 1% CREAM: 1 | 20 days supply | Qty: 50 | Fill #0

## 2015-07-07 NOTE — Telephone Encounter (Signed)
What is the reason he is requesting? Will need to know if he has burned himself and needs assessment.

## 2015-07-07 NOTE — Telephone Encounter (Signed)
Ok to send in 1 refill with 0 additional refills

## 2015-07-07 NOTE — Telephone Encounter (Signed)
Received fax from [place] for SSD 1% cream. Apply topically daily, 50gm. Last Rx 07/17/12.  Please advise?

## 2015-07-07 NOTE — Telephone Encounter (Signed)
Spoke to pt and he states he doesn't currently have any burns. States "it is just a great cream to have on hand for scrapes, burns, etc. .  And just wants to have some on hand to use when needed."  Please advise?

## 2015-07-07 NOTE — Telephone Encounter (Signed)
Rx sent, notified pt. 

## 2015-07-31 MED FILL — RAMIPRIL 10 MG CAPSULE: 10 | 30 days supply | Qty: 60 | Fill #2

## 2015-07-31 MED FILL — SERTRALINE HCL 100 MG TAB: 100 | 30 days supply | Qty: 30 | Fill #2

## 2015-07-31 MED FILL — BUPROPION HCL XL 300 MG TAB: 300 | 30 days supply | Qty: 30 | Fill #3

## 2015-07-31 MED FILL — SIMVASTATIN 40 MG TABLET: 40 | 30 days supply | Qty: 30 | Fill #2

## 2015-07-31 MED FILL — metFORMIN HCL 1000 MG TABS: 1000 | 30 days supply | Qty: 60 | Fill #1

## 2015-07-31 MED FILL — ATORVASTATIN 40 MG TABLET: 40 | 30 days supply | Qty: 30 | Fill #2

## 2015-08-03 MED FILL — ALPRAZolam 1 MG TABS: 1 | 30 days supply | Qty: 60 | Fill #2

## 2015-08-29 ENCOUNTER — Other Ambulatory Visit: Payer: Self-pay | Admitting: Physician Assistant

## 2015-08-29 MED FILL — RAMIPRIL 10 MG CAPSULE: 10 | 30 days supply | Qty: 60 | Fill #3

## 2015-08-29 MED FILL — metFORMIN HCL 1000 MG TABS: 1000 | 30 days supply | Qty: 60 | Fill #2

## 2015-08-29 MED FILL — SERTRALINE HCL 100 MG TAB: 100 | 30 days supply | Qty: 30 | Fill #0

## 2015-08-29 MED FILL — FENOFIBRATE 145 MG TABLET: 145 | 30 days supply | Qty: 30 | Fill #1

## 2015-08-29 MED FILL — ATORVASTATIN 40 MG TABLET: 40 | 30 days supply | Qty: 30 | Fill #3

## 2015-08-29 MED FILL — BUPROPION HCL XL 300 MG TAB: 300 | 30 days supply | Qty: 30 | Fill #4

## 2015-08-29 MED FILL — BASAGLAR 100 UNIT/ML KWIKPE: 100 | 89 days supply | Qty: 75 | Fill #1

## 2015-08-29 NOTE — Telephone Encounter (Signed)
Refill request for Zoloft 100 mg Last filled by MD on - 06/05/15, #30x2 Last AEX - 05/22/15 Per VO provider, Ok to send refills to pharmacy/SLS 07/25

## 2015-08-30 MED ORDER — ALPRAZOLAM 1 MG PO TABS: ORAL_TABLET | ORAL | 2 refills | Status: DC

## 2015-08-30 MED FILL — ALPRAZolam 1 MG TABS: 1 | 30 days supply | Qty: 60 | Fill #0

## 2015-08-30 NOTE — Telephone Encounter (Signed)
Faxed refill request received from [place] for Alprazolam Last filled by MD on 06/05/15, #60x2 Last AEX - 05/22/15 Refill sent via fax per [facility] refill protocol/SLS

## 2015-09-06 DIAGNOSIS — M62830 Muscle spasm of back: Secondary | ICD-10-CM | POA: Diagnosis not present

## 2015-09-06 DIAGNOSIS — M9903 Segmental and somatic dysfunction of lumbar region: Secondary | ICD-10-CM | POA: Diagnosis not present

## 2015-09-06 DIAGNOSIS — M5416 Radiculopathy, lumbar region: Secondary | ICD-10-CM | POA: Diagnosis not present

## 2015-09-06 DIAGNOSIS — M545 Low back pain: Secondary | ICD-10-CM | POA: Diagnosis not present

## 2015-10-05 ENCOUNTER — Other Ambulatory Visit: Payer: Self-pay | Admitting: Physician Assistant

## 2015-10-05 MED FILL — ALPRAZolam 1 MG TABS: 1 | 30 days supply | Qty: 60 | Fill #1

## 2015-10-05 MED FILL — BUPROPION HCL XL 300 MG TAB: 300 | 30 days supply | Qty: 30 | Fill #5

## 2015-10-05 MED FILL — FENOFIBRATE 145 MG TABLET: 145 | 30 days supply | Qty: 30 | Fill #2

## 2015-10-05 MED FILL — SERTRALINE HCL 100 MG TAB: 100 | 30 days supply | Qty: 30 | Fill #1

## 2015-10-05 MED FILL — RAMIPRIL 10 MG CAPSULE: 10 | 30 days supply | Qty: 60 | Fill #4

## 2015-10-05 NOTE — Telephone Encounter (Signed)
Pt overdue for follow-up and no future appts scheduled. Please advise.

## 2015-10-06 MED FILL — ATORVASTATIN 40 MG TABLET: 40 | 30 days supply | Qty: 30 | Fill #0

## 2015-10-06 MED FILL — metFORMIN HCL 1000 MG TABS: 1000 | 30 days supply | Qty: 60 | Fill #0

## 2015-10-06 NOTE — Telephone Encounter (Signed)
Medication filled to pharmacy as requested. Called pt and left message to return call and sent MyChart message to pt to schedule appt.

## 2015-10-06 NOTE — Telephone Encounter (Signed)
Ok to give one month of the Metformin. Ok to refill Lipitor the same as before. He needs to be contacted to schedule FU.

## 2015-10-17 ENCOUNTER — Ambulatory Visit (INDEPENDENT_AMBULATORY_CARE_PROVIDER_SITE_OTHER): Payer: Federal, State, Local not specified - PPO | Admitting: *Deleted

## 2015-10-17 DIAGNOSIS — Z23 Encounter for immunization: Secondary | ICD-10-CM | POA: Diagnosis not present

## 2015-10-17 DIAGNOSIS — H2513 Age-related nuclear cataract, bilateral: Secondary | ICD-10-CM | POA: Diagnosis not present

## 2015-10-17 DIAGNOSIS — H40013 Open angle with borderline findings, low risk, bilateral: Secondary | ICD-10-CM | POA: Diagnosis not present

## 2015-10-17 DIAGNOSIS — E113291 Type 2 diabetes mellitus with mild nonproliferative diabetic retinopathy without macular edema, right eye: Secondary | ICD-10-CM | POA: Diagnosis not present

## 2015-10-17 DIAGNOSIS — E119 Type 2 diabetes mellitus without complications: Secondary | ICD-10-CM | POA: Diagnosis not present

## 2015-10-17 DIAGNOSIS — K080 Exfoliation of teeth due to systemic causes: Secondary | ICD-10-CM | POA: Diagnosis not present

## 2015-10-17 LAB — HM DIABETES EYE EXAM

## 2015-10-20 ENCOUNTER — Ambulatory Visit (INDEPENDENT_AMBULATORY_CARE_PROVIDER_SITE_OTHER): Payer: Federal, State, Local not specified - PPO | Admitting: Physician Assistant

## 2015-10-20 ENCOUNTER — Encounter: Payer: Self-pay | Admitting: Physician Assistant

## 2015-10-20 VITALS — BP 110/80 | HR 89 | Temp 97.9°F | Resp 16 | Ht 72.0 in | Wt 229.1 lb

## 2015-10-20 DIAGNOSIS — E785 Hyperlipidemia, unspecified: Secondary | ICD-10-CM | POA: Diagnosis not present

## 2015-10-20 DIAGNOSIS — I10 Essential (primary) hypertension: Secondary | ICD-10-CM | POA: Diagnosis not present

## 2015-10-20 DIAGNOSIS — Z0000 Encounter for general adult medical examination without abnormal findings: Secondary | ICD-10-CM

## 2015-10-20 DIAGNOSIS — Z794 Long term (current) use of insulin: Secondary | ICD-10-CM

## 2015-10-20 DIAGNOSIS — E119 Type 2 diabetes mellitus without complications: Secondary | ICD-10-CM | POA: Diagnosis not present

## 2015-10-20 DIAGNOSIS — Z1211 Encounter for screening for malignant neoplasm of colon: Secondary | ICD-10-CM

## 2015-10-20 LAB — COMPREHENSIVE METABOLIC PANEL
ALBUMIN: 4.2 g/dL (ref 3.5–5.2)
ALT: 62 U/L — ABNORMAL HIGH (ref 0–53)
AST: 37 U/L (ref 0–37)
Alkaline Phosphatase: 86 U/L (ref 39–117)
BUN: 16 mg/dL (ref 6–23)
CHLORIDE: 103 meq/L (ref 96–112)
CO2: 28 meq/L (ref 19–32)
CREATININE: 0.81 mg/dL (ref 0.40–1.50)
Calcium: 9 mg/dL (ref 8.4–10.5)
GFR: 104.26 mL/min (ref >60.00)
GLUCOSE: 131 mg/dL — AB (ref 70–99)
POTASSIUM: 4.4 meq/L (ref 3.5–5.1)
SODIUM: 139 meq/L (ref 135–145)
Total Bilirubin: 0.4 mg/dL (ref 0.2–1.2)
Total Protein: 6.7 g/dL (ref 6.0–8.3)

## 2015-10-20 LAB — CBC
HEMATOCRIT: 39 % (ref 39.0–52.0)
HEMOGLOBIN: 13.5 g/dL (ref 13.0–17.0)
MCHC: 34.7 g/dL (ref 30.0–36.0)
MCV: 86.4 fl (ref 78.0–100.0)
Platelets: 212 10*3/uL (ref 150.0–400.0)
RBC: 4.51 Mil/uL (ref 4.22–5.81)
RDW: 13.6 % (ref 11.5–15.5)
WBC: 6.9 10*3/uL (ref 4.0–10.5)

## 2015-10-20 LAB — URINALYSIS, ROUTINE W REFLEX MICROSCOPIC
Bilirubin Urine: NEGATIVE
HGB URINE DIPSTICK: NEGATIVE
Ketones, ur: NEGATIVE
Leukocytes, UA: NEGATIVE
NITRITE: NEGATIVE
Specific Gravity, Urine: 1.015 (ref 1.000–1.030)
Total Protein, Urine: NEGATIVE
Urine Glucose: NEGATIVE
Urobilinogen, UA: 0.2 (ref 0.0–1.0)
pH: 5.5 (ref 5.0–8.0)

## 2015-10-20 LAB — PSA: PSA: 0.33 ng/mL (ref 0.10–4.00)

## 2015-10-20 LAB — LIPID PANEL
CHOL/HDL RATIO: 6
CHOLESTEROL: 167 mg/dL (ref 0–200)
HDL: 25.7 mg/dL — ABNORMAL LOW (ref >39.00)
NONHDL: 140.83
TRIGLYCERIDES: 302 mg/dL — AB (ref 0.0–149.0)
VLDL: 60.4 mg/dL — AB (ref 0.0–40.0)

## 2015-10-20 LAB — TSH: TSH: 4.89 u[IU]/mL — AB (ref 0.35–4.50)

## 2015-10-20 LAB — HEMOGLOBIN A1C: Hgb A1c MFr Bld: 9.8 % — ABNORMAL HIGH (ref 4.6–6.5)

## 2015-10-20 LAB — LDL CHOLESTEROL, DIRECT: LDL DIRECT: 100 mg/dL

## 2015-10-20 LAB — VITAMIN B12: VITAMIN B 12: 288 pg/mL (ref 211–911)

## 2015-10-20 MED ORDER — SILDENAFIL CITRATE 20 MG PO TABS: ORAL_TABLET | ORAL | 0 refills | Status: DC

## 2015-10-20 MED ORDER — BAYER CONTOUR MONITOR W/DEVICE KIT: PACK | 0 refills | Status: DC

## 2015-10-20 NOTE — Progress Notes (Signed)
Patient presents to clinic today for annual exam.  Patient is fasting for labs.  Chronic Issues: Diabetes Mellitus II, Uncontrolled -- Patient is currently on a combination of Basaglar 100 units daily and Metformin 1000 mg BID. Is only taking Metformin in the AM as he states he forgets to take at night sometimes. Has had recent eye examination. No retinopathy. Denies polydipsia, polyphagia or polyuria. Does note intermittent tingling of feet bilaterally. Denies numbness. Most recent A1C at 9.6.  Hypertension -- Is currently on Ramipril 20 mg daily. Endorses taking as directed. Patient denies chest pain, palpitations, lightheadedness, dizziness, vision changes or frequent headaches.  BP Readings from Last 3 Encounters:  10/20/15 110/80  05/22/15 132/90  03/03/15 124/80   Hyperlipidemia -- Currently on combination of Atorvastatin and 81 mg ASA daily. Is taking as directed. Is trying to watch diet. Is not exercising like he used to. Body mass index is 31.07 kg/m.  Health Maintenance: Immunizations -- Flu shot updated. Colonoscopy -- Overdue. Agrees to screening colonoscopy. Order placed.  Past Medical History:  Diagnosis Date  . Adjustment disorder with mixed anxiety and depressed mood   . Arthritis   . Asthma    history  . Diabetes mellitus    type 2  . Elevated blood pressure    history of high blood pressure readings  . History of chicken pox   . Hypercholesteremia     Past Surgical History:  Procedure Laterality Date  . ELBOW SURGERY     right elbow 2010, left elbow 08/06/10 [name])  . KNEE SURGERY     right knee new ACL- 1993,2000  . KNEE SURGERY     left knee 2003  . NASAL SEPTUM SURGERY     1992  . PARTIAL HIP ARTHROPLASTY     right hip replacement    Current Outpatient Prescriptions on File Prior to Visit  Medication Sig Dispense Refill  . ALPRAZolam (XANAX) 1 MG tablet TAKE 1 TO 2 TABLETS BY MOUTH AT BEDTIME AS NEEDED FOR SLEEP OR ANXIETY 60 tablet  2  . atorvastatin (LIPITOR) 40 MG tablet TAKE 1 TABLET (40 MG TOTAL) BY MOUTH DAILY. 30 tablet 3  . Blood Glucose Monitoring Suppl (ONE TOUCH ULTRA SYSTEM KIT) W/DEVICE KIT 1 kit by Does not apply route once. Use to check blood sugar four times daily [Patient not checking daily]    . buPROPion (WELLBUTRIN XL) 300 MG 24 hr tablet TAKE 1 TABLET (300 MG TOTAL) BY MOUTH DAILY. (Patient taking differently: TAKE 1 TABLET (300 MG TOTAL) BY MOUTH IN EVENING.) 30 tablet 5  . fenofibrate (TRICOR) 145 MG tablet TAKE 1 TABLET (145 MG TOTAL) BY MOUTH DAILY. 30 tablet 3  . glucose blood (ACCU-CHEK AVIVA) test strip Use to check blood sugar 8 times per day. 300 each 2  . glucose blood (ACCU-CHEK COMPACT STRIPS) test strip Use as instructed to check blood sugar eight times a day. 300 each 2  . Insulin Glargine (BASAGLAR KWIKPEN) 100 UNIT/ML SOPN Inject 0.85 mLs (85 Units total) into the skin at bedtime. (Patient taking differently: Inject 100 Units into the skin every morning. ) 90 mL 1  . Insulin Pen Needle (RELION PEN NEEDLE 31G/8MM) 31G X 8 MM MISC Use as Directed daily to check blood sugars 100 each 3  . Insulin Syringe-Needle U-100 30G 1 ML MISC USE AS DIRECTED TO INJECT INSULIN DX: 250.00 100 each 3  . metFORMIN (GLUCOPHAGE) 1000 MG tablet TAKE 1 TABLET BY MOUTH TWICE DAILY  WITH A MEAL (Patient taking differently: TAKE 1 TABLET BY MOUTH TWICE DAILY WITH A MEAL PATIENT TAKES ONE TAB IN MORNING, AND ONE (NOT DAILY) AT BEDTIME.) 60 tablet 0  . ONE TOUCH LANCETS MISC Use as directed 200 each 11  . ramipril (ALTACE) 10 MG capsule TAKE TWO CAPSULES BY MOUTH DAILY 60 capsule 5  . sertraline (ZOLOFT) 100 MG tablet TAKE 1 TABLET BY MOUTH DAILY (Patient taking differently: TAKE 1 TABLET BY MOUTH IN THE EVENING.) 30 tablet 2  . silver sulfADIAZINE (SILVADENE) 1 % cream Apply 1 application topically daily as needed. 50 g 0  . VIAGRA 100 MG tablet TAKE 1/2 TO 1 TABLET (50-100 MG TOTAL) BY MOUTH DAILY AS NEEDED FOR ERECTILE  DYSFUNCTION. (Patient not taking: Reported on 10/20/2015) 4 tablet 2   No current facility-administered medications on file prior to visit.     No Known Allergies  Family History  Problem Relation Age of Onset  . Diabetes Mother   . Thyroid disease Mother     Questionable  . Heart disease Father     deceased  . Other Neg Hx     hypogonadism    Social History   Social History  . Marital status: Married    Spouse name: N/A  . Number of children: N/A  . Years of education: N/A   Occupational History  . Not on file.   Social History Main Topics  . Smoking status: Never Smoker  . Smokeless tobacco: Never Used  . Alcohol use Not on file  . Drug use: Unknown  . Sexual activity: Not on file   Other Topics Concern  . Not on file   Social History Narrative   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   Married -42 marriage (2nd marriage)   daughter 27,  2 sons (2nd marriage  11,6)   [place]   Never Smoked    Alcohol use-yes   Drug use-no    Regular exercise-no   Smoking Status:  never   Does Patient Exercise:  no   Caffeine use/day:  3-4 cups coffee daily   Drug Use:  no         Review of Systems  Constitutional: Negative for fever and weight loss.  HENT: Negative for ear discharge, ear pain, hearing loss and tinnitus.   Eyes: Negative for blurred vision, double vision, photophobia and pain.  Respiratory: Negative for cough and shortness of breath.   Cardiovascular: Negative for chest pain and palpitations.  Gastrointestinal: Negative for abdominal pain, blood in stool, constipation, diarrhea, heartburn, melena, nausea and vomiting.  Genitourinary: Negative for dysuria, flank pain, frequency, hematuria and urgency.  Musculoskeletal: Negative for falls.  Neurological: Negative for dizziness, loss of consciousness and headaches.  Endo/Heme/Allergies: Negative for environmental allergies.  Psychiatric/Behavioral: Negative for depression, hallucinations, substance abuse and suicidal  ideas. The patient is not nervous/anxious and does not have insomnia.    BP 110/80 (BP Location: Right Arm, Patient Position: Sitting, Cuff Size: Large)   Pulse 89   Temp 97.9 F (36.6 C) (Oral)   Resp 16   Ht 6' (1.829 m)   Wt 229 lb 2 oz (103.9 kg)   SpO2 98%   BMI 31.07 kg/m   Physical Exam  Constitutional: He is oriented to person, place, and time and well-developed, well-nourished, and in no distress.  HENT:  Head: Normocephalic and atraumatic.  Right Ear: External ear normal.  Left Ear: External ear normal.  Nose: Nose normal.  Mouth/Throat: Oropharynx is clear and  moist. No oropharyngeal exudate.  Eyes: Conjunctivae and EOM are normal. Pupils are equal, round, and reactive to light.  Neck: Neck supple. No thyromegaly present.  Cardiovascular: Normal rate, regular rhythm, normal heart sounds and intact distal pulses.   Pulmonary/Chest: Effort normal and breath sounds normal. No respiratory distress. He has no wheezes. He has no rales. He exhibits no tenderness.  Abdominal: Soft. Bowel sounds are normal. He exhibits no distension and no mass. There is no tenderness. There is no rebound and no guarding.  Genitourinary: Testes/scrotum normal and penis normal. No discharge found.  Lymphadenopathy:    He has no cervical adenopathy.  Neurological: He is alert and oriented to person, place, and time.  Skin: Skin is warm and dry. No rash noted.  Psychiatric: Affect normal.  Vitals reviewed.  Diabetic Foot Form - Detailed   Diabetic Foot Exam - detailed Diabetic Foot exam was performed with the following findings:  Yes 10/20/2015 12:00 PM  Visual Foot Exam completed.:  Yes  Is there a history of foot ulcer?:  No Can the patient see the bottom of their feet?:  Yes Are the shoes appropriate in style and fit?:  Yes Is there swelling or and abnormal foot shape?:  No Are the toenails long?:  No Are the toenails thick?:  No Do you have pain in calf while walking?:  No Is there a  claw toe deformity?:  No Is there elevated skin temparature?:  No Is there limited skin dorsiflexion?:  No Is there foot or ankle muscle weakness?:  No Are the toenails ingrown?:  No Normal Range of Motion:  Yes Pulse Foot Exam completed.:  Yes  Right posterior Tibialias:  Present Left posterior Tibialias:  Present  Right Dorsalis Pedis:  Present Left Dorsalis Pedis:  Present  Sensory Foot Exam Completed.:  Yes Swelling:  No Semmes-Weinstein Monofilament Test R Foot Test Control:  Neg L Foot Test Control:  Neg  R Site 1-Great Toe:  Neg L Site 1-Great Toe:  Neg  R Site 4:  Neg L Site 4:  Neg  R Site 5:  Neg L Site 5:  Neg       Assessment/Plan: 1. Visit for preventive health examination Depression screen negative. Health Maintenance reviewed -- Flu shot updated. Referral to GI for screening colonoscopy placed. Preventive schedule discussed and handout given in AVS. Will obtain fasting labs today.  - CBC - Comprehensive metabolic panel - Hemoglobin A1c - Lipid panel - PSA - TSH - Urinalysis, Routine w reflex microscopic (not at [facility] - B12 - Blood Glucose Monitoring Suppl (BAYER CONTOUR MONITOR) w/Device KIT; Use with strips to check fasting sugar once daily. E 11.9  Dispense: 1 kit; Refill: 0  2. Hyperlipemia Will check lipids today. Continue current medication regimen for now.  3. Essential hypertension BP stable. Will continue current regimen. Will check labs today.  4. Type 2 diabetes mellitus without complication, with long-term current use of insulin (HCC) Uncontrolled. Not compliant with medications. Discussed importance of compliance and with checking sugar levels. Will check labs today. Foot exam updated. No abnormal findings. Will obtain records from opthalmology.   Leeanne Rio, PA-C

## 2015-10-20 NOTE — Patient Instructions (Signed)
Please go to the lab for blood work.   Our office will call you with your results unless you have chosen to receive results via MyChart.  If your blood work is normal we will follow-up each year for physicals and as scheduled for chronic medical problems.  If anything is abnormal we will treat accordingly and get you in for a follow-up.  Please take all medications as directed. We will alter regimen based on lab results.  Preventive Care for Adults, Male A healthy lifestyle and preventive care can promote health and wellness. Preventive health guidelines for men include the following key practices:  A routine yearly physical is a good way to check with your health care provider about your health and preventative screening. It is a chance to share any concerns and updates on your health and to receive a thorough exam.  Visit your dentist for a routine exam and preventative care every 6 months. Brush your teeth twice a day and floss once a day. Good oral hygiene prevents tooth decay and gum disease.  The frequency of eye exams is based on your age, health, family medical history, use of contact lenses, and other factors. Follow your health care provider's recommendations for frequency of eye exams.  Eat a healthy diet. Foods such as vegetables, fruits, whole grains, low-fat dairy products, and lean protein foods contain the nutrients you need without too many calories. Decrease your intake of foods high in solid fats, added sugars, and salt. Eat the right amount of calories for you.Get information about a proper diet from your health care provider, if necessary.  Regular physical exercise is one of the most important things you can do for your health. Most adults should get at least 150 minutes of moderate-intensity exercise (any activity that increases your heart rate and causes you to sweat) each week. In addition, most adults need muscle-strengthening exercises on 2 or more days a  week.  Maintain a healthy weight. The body mass index (BMI) is a screening tool to identify possible weight problems. It provides an estimate of body fat based on height and weight. Your health care provider can find your BMI and can help you achieve or maintain a healthy weight.For adults 20 years and older:  A BMI below 18.5 is considered underweight.  A BMI of 18.5 to 24.9 is normal.  A BMI of 25 to 29.9 is considered overweight.  A BMI of 30 and above is considered obese.  Maintain normal blood lipids and cholesterol levels by exercising and minimizing your intake of saturated fat. Eat a balanced diet with plenty of fruit and vegetables. Blood tests for lipids and cholesterol should begin at age 4 and be repeated every 5 years. If your lipid or cholesterol levels are high, you are over 50, or you are at high risk for heart disease, you may need your cholesterol levels checked more frequently.Ongoing high lipid and cholesterol levels should be treated with medicines if diet and exercise are not working.  If you smoke, find out from your health care provider how to quit. If you do not use tobacco, do not start.  Lung cancer screening is recommended for adults aged 60-80 years who are at high risk for developing lung cancer because of a history of smoking. A yearly low-dose CT scan of the lungs is recommended for people who have at least a 30-pack-year history of smoking and are a current smoker or have quit within the past 15 years. A pack year  of smoking is smoking an average of 1 pack of cigarettes a day for 1 year (for example: 1 pack a day for 30 years or 2 packs a day for 15 years). Yearly screening should continue until the smoker has stopped smoking for at least 15 years. Yearly screening should be stopped for people who develop a health problem that would prevent them from having lung cancer treatment.  If you choose to drink alcohol, do not have more than 2 drinks per day. One drink  is considered to be 12 ounces (355 mL) of beer, 5 ounces (148 mL) of wine, or 1.5 ounces (44 mL) of liquor.  Avoid use of street drugs. Do not share needles with anyone. Ask for help if you need support or instructions about stopping the use of drugs.  High blood pressure causes heart disease and increases the risk of stroke. Your blood pressure should be checked at least every 1-2 years. Ongoing high blood pressure should be treated with medicines, if weight loss and exercise are not effective.  If you are 55-24 years old, ask your health care provider if you should take aspirin to prevent heart disease.  Diabetes screening is done by taking a blood sample to check your blood glucose level after you have not eaten for a certain period of time (fasting). If you are not overweight and you do not have risk factors for diabetes, you should be screened once every 3 years starting at age 40. If you are overweight or obese and you are 33-55 years of age, you should be screened for diabetes every year as part of your cardiovascular risk assessment.  Colorectal cancer can be detected and often prevented. Most routine colorectal cancer screening begins at the age of 57 and continues through age 65. However, your health care provider may recommend screening at an earlier age if you have risk factors for colon cancer. On a yearly basis, your health care provider may provide home test kits to check for hidden blood in the stool. Use of a small camera at the end of a tube to directly examine the colon (sigmoidoscopy or colonoscopy) can detect the earliest forms of colorectal cancer. Talk to your health care provider about this at age 73, when routine screening begins. Direct exam of the colon should be repeated every 5-10 years through age 46, unless early forms of precancerous polyps or small growths are found.  People who are at an increased risk for hepatitis B should be screened for this virus. You are considered  at high risk for hepatitis B if:  You were born in a country where hepatitis B occurs often. Talk with your health care provider about which countries are considered high risk.  Your parents were born in a high-risk country and you have not received a shot to protect against hepatitis B (hepatitis B vaccine).  You have HIV or AIDS.  You use needles to inject street drugs.  You live with, or have sex with, someone who has hepatitis B.  You are a man who has sex with other men (MSM).  You get hemodialysis treatment.  You take certain medicines for conditions such as cancer, organ transplantation, and autoimmune conditions.  Hepatitis C blood testing is recommended for all people born from [birth year] through 1965 and any individual with known risks for hepatitis C.  Practice safe sex. Use condoms and avoid high-risk sexual practices to reduce the spread of sexually transmitted infections (STIs). STIs include gonorrhea, chlamydia, syphilis,  trichomonas, herpes, HPV, and human immunodeficiency virus (HIV). Herpes, HIV, and HPV are viral illnesses that have no cure. They can result in disability, cancer, and death.  If you are a man who has sex with other men, you should be screened at least once per year for:  HIV.  Urethral, rectal, and pharyngeal infection of gonorrhea, chlamydia, or both.  If you are at risk of being infected with HIV, it is recommended that you take a prescription medicine daily to prevent HIV infection. This is called preexposure prophylaxis (PrEP). You are considered at risk if:  You are a man who has sex with other men (MSM) and have other risk factors.  You are a heterosexual man, are sexually active, and are at increased risk for HIV infection.  You take drugs by injection.  You are sexually active with a partner who has HIV.  Talk with your health care provider about whether you are at high risk of being infected with HIV. If you choose to begin PrEP, you should  first be tested for HIV. You should then be tested every 3 months for as long as you are taking PrEP.  A one-time screening for abdominal aortic aneurysm (AAA) and surgical repair of large AAAs by ultrasound are recommended for men ages 88 to 8 years who are current or former smokers.  Healthy men should no longer receive prostate-specific antigen (PSA) blood tests as part of routine cancer screening. Talk with your health care provider about prostate cancer screening.  Testicular cancer screening is not recommended for adult males who have no symptoms. Screening includes self-exam, a health care provider exam, and other screening tests. Consult with your health care provider about any symptoms you have or any concerns you have about testicular cancer.  Use sunscreen. Apply sunscreen liberally and repeatedly throughout the day. You should seek shade when your shadow is shorter than you. Protect yourself by wearing long sleeves, pants, a wide-brimmed hat, and sunglasses year round, whenever you are outdoors.  Once a month, do a whole-body skin exam, using a mirror to look at the skin on your back. Tell your health care provider about new moles, moles that have irregular borders, moles that are larger than a pencil eraser, or moles that have changed in shape or color.  Stay current with required vaccines (immunizations).  Influenza vaccine. All adults should be immunized every year.  Tetanus, diphtheria, and acellular pertussis (Td, Tdap) vaccine. An adult who has not previously received Tdap or who does not know his vaccine status should receive 1 dose of Tdap. This initial dose should be followed by tetanus and diphtheria toxoids (Td) booster doses every 10 years. Adults with an unknown or incomplete history of completing a 3-dose immunization series with Td-containing vaccines should begin or complete a primary immunization series including a Tdap dose. Adults should receive a Td booster every 10  years.  Varicella vaccine. An adult without evidence of immunity to varicella should receive 2 doses or a second dose if he has previously received 1 dose.  Human papillomavirus (HPV) vaccine. Males aged 11-21 years who have not received the vaccine previously should receive the 3-dose series. Males aged 22-26 years may be immunized. Immunization is recommended through the age of 51 years for any male who has sex with males and did not get any or all doses earlier. Immunization is recommended for any person with an immunocompromised condition through the age of 58 years if he did not get any or  all doses earlier. During the 3-dose series, the second dose should be obtained 4-8 weeks after the first dose. The third dose should be obtained 24 weeks after the first dose and 16 weeks after the second dose.  Zoster vaccine. One dose is recommended for adults aged 88 years or older unless certain conditions are present.  Measles, mumps, and rubella (MMR) vaccine. Adults born before [birth year] generally are considered immune to measles and mumps. Adults born in [birth year] or later should have 1 or more doses of MMR vaccine unless there is a contraindication to the vaccine or there is laboratory evidence of immunity to each of the three diseases. A routine second dose of MMR vaccine should be obtained at least 28 days after the first dose for students attending postsecondary schools, health care workers, or international travelers. People who received inactivated measles vaccine or an unknown type of measles vaccine during 1963-1967 should receive 2 doses of MMR vaccine. People who received inactivated mumps vaccine or an unknown type of mumps vaccine before 1979 and are at high risk for mumps infection should consider immunization with 2 doses of MMR vaccine. Unvaccinated health care workers born before [birth year] who lack laboratory evidence of measles, mumps, or rubella immunity or laboratory confirmation of disease should  consider measles and mumps immunization with 2 doses of MMR vaccine or rubella immunization with 1 dose of MMR vaccine.  Pneumococcal 13-valent conjugate (PCV13) vaccine. When indicated, a person who is uncertain of his immunization history and has no record of immunization should receive the PCV13 vaccine. All adults 54 years of age and older should receive this vaccine. An adult aged 30 years or older who has certain medical conditions and has not been previously immunized should receive 1 dose of PCV13 vaccine. This PCV13 should be followed with a dose of pneumococcal polysaccharide (PPSV23) vaccine. Adults who are at high risk for pneumococcal disease should obtain the PPSV23 vaccine at least 8 weeks after the dose of PCV13 vaccine. Adults older than 57 years of age who have normal immune system function should obtain the PPSV23 vaccine dose at least 1 year after the dose of PCV13 vaccine.  Pneumococcal polysaccharide (PPSV23) vaccine. When PCV13 is also indicated, PCV13 should be obtained first. All adults aged 31 years and older should be immunized. An adult younger than age 49 years who has certain medical conditions should be immunized. Any person who resides in a nursing home or long-term care facility should be immunized. An adult smoker should be immunized. People with an immunocompromised condition and certain other conditions should receive both PCV13 and PPSV23 vaccines. People with human immunodeficiency virus (HIV) infection should be immunized as soon as possible after diagnosis. Immunization during chemotherapy or radiation therapy should be avoided. Routine use of PPSV23 vaccine is not recommended for American Indians, [place] Natives, or people younger than 65 years unless there are medical conditions that require PPSV23 vaccine. When indicated, people who have unknown immunization and have no record of immunization should receive PPSV23 vaccine. One-time revaccination 5 years after the first  dose of PPSV23 is recommended for people aged 19-64 years who have chronic kidney failure, nephrotic syndrome, asplenia, or immunocompromised conditions. People who received 1-2 doses of PPSV23 before age 18 years should receive another dose of PPSV23 vaccine at age 77 years or later if at least 5 years have passed since the previous dose. Doses of PPSV23 are not needed for people immunized with PPSV23 at or after age 66 years.  Meningococcal  vaccine. Adults with asplenia or persistent complement component deficiencies should receive 2 doses of quadrivalent meningococcal conjugate (MenACWY-D) vaccine. The doses should be obtained at least 2 months apart. Microbiologists working with certain meningococcal bacteria, [employer] recruits, people at risk during an outbreak, and people who travel to or live in countries with a high rate of meningitis should be immunized. A first-year college student up through age 53 years who is living in a residence hall should receive a dose if he did not receive a dose on or after his 16th birthday. Adults who have certain high-risk conditions should receive one or more doses of vaccine.  Hepatitis A vaccine. Adults who wish to be protected from this disease, have chronic liver disease, work with hepatitis A-infected animals, work in hepatitis A research labs, or travel to or work in countries with a high rate of hepatitis A should be immunized. Adults who were previously unvaccinated and who anticipate close contact with an international adoptee during the first 60 days after arrival in the [place] from a country with a high rate of hepatitis A should be immunized.  Hepatitis B vaccine. Adults should be immunized if they wish to be protected from this disease, are under age 7 years and have diabetes, have chronic liver disease, have had more than one sex partner in the past 6 months, may be exposed to blood or other infectious body fluids, are household contacts or sex  partners of hepatitis B positive people, are clients or workers in certain care facilities, or travel to or work in countries with a high rate of hepatitis B.  Haemophilus influenzae type b (Hib) vaccine. A previously unvaccinated person with asplenia or sickle cell disease or having a scheduled splenectomy should receive 1 dose of Hib vaccine. Regardless of previous immunization, a recipient of a hematopoietic stem cell transplant should receive a 3-dose series 6-12 months after his successful transplant. Hib vaccine is not recommended for adults with HIV infection. Preventive Service / Frequency Ages 68 to 55  Blood pressure check.** / Every 3-5 years.  Lipid and cholesterol check.** / Every 5 years beginning at age 64.  Hepatitis C blood test.** / For any individual with known risks for hepatitis C.  Skin self-exam. / Monthly.  Influenza vaccine. / Every year.  Tetanus, diphtheria, and acellular pertussis (Tdap, Td) vaccine.** / Consult your health care provider. 1 dose of Td every 10 years.  Varicella vaccine.** / Consult your health care provider.  HPV vaccine. / 3 doses over 6 months, if 17 or younger.  Measles, mumps, rubella (MMR) vaccine.** / You need at least 1 dose of MMR if you were born in 1957 or later. You may also need a second dose.  Pneumococcal 13-valent conjugate (PCV13) vaccine.** / Consult your health care provider.  Pneumococcal polysaccharide (PPSV23) vaccine.** / 1 to 2 doses if you smoke cigarettes or if you have certain conditions.  Meningococcal vaccine.** / 1 dose if you are age 28 to 3 years and a Market researcher living in a residence hall, or have one of several medical conditions. You may also need additional booster doses.  Hepatitis A vaccine.** / Consult your health care provider.  Hepatitis B vaccine.** / Consult your health care provider.  Haemophilus influenzae type b (Hib) vaccine.** / Consult your health care provider. Ages 38 to  36  Blood pressure check.** / Every year.  Lipid and cholesterol check.** / Every 5 years beginning at age 77.  Lung cancer screening. /  Every year if you are aged 23-80 years and have a 30-pack-year history of smoking and currently smoke or have quit within the past 15 years. Yearly screening is stopped once you have quit smoking for at least 15 years or develop a health problem that would prevent you from having lung cancer treatment.  Fecal occult blood test (FOBT) of stool. / Every year beginning at age 33 and continuing until age 39. You may not have to do this test if you get a colonoscopy every 10 years.  Flexible sigmoidoscopy** or colonoscopy.** / Every 5 years for a flexible sigmoidoscopy or every 10 years for a colonoscopy beginning at age 2 and continuing until age 24.  Hepatitis C blood test.** / For all people born from [birth year] through 1965 and any individual with known risks for hepatitis C.  Skin self-exam. / Monthly.  Influenza vaccine. / Every year.  Tetanus, diphtheria, and acellular pertussis (Tdap/Td) vaccine.** / Consult your health care provider. 1 dose of Td every 10 years.  Varicella vaccine.** / Consult your health care provider.  Zoster vaccine.** / 1 dose for adults aged 63 years or older.  Measles, mumps, rubella (MMR) vaccine.** / You need at least 1 dose of MMR if you were born in 1959 or later. You may also need a second dose.  Pneumococcal 13-valent conjugate (PCV13) vaccine.** / Consult your health care provider.  Pneumococcal polysaccharide (PPSV23) vaccine.** / 1 to 2 doses if you smoke cigarettes or if you have certain conditions.  Meningococcal vaccine.** / Consult your health care provider.  Hepatitis A vaccine.** / Consult your health care provider.  Hepatitis B vaccine.** / Consult your health care provider.  Haemophilus influenzae type b (Hib) vaccine.** / Consult your health care provider. Ages 64 and over  Blood pressure check.** /  Every year.  Lipid and cholesterol check.**/ Every 5 years beginning at age 30.  Lung cancer screening. / Every year if you are aged 48-80 years and have a 30-pack-year history of smoking and currently smoke or have quit within the past 15 years. Yearly screening is stopped once you have quit smoking for at least 15 years or develop a health problem that would prevent you from having lung cancer treatment.  Fecal occult blood test (FOBT) of stool. / Every year beginning at age 39 and continuing until age 75. You may not have to do this test if you get a colonoscopy every 10 years.  Flexible sigmoidoscopy** or colonoscopy.** / Every 5 years for a flexible sigmoidoscopy or every 10 years for a colonoscopy beginning at age 35 and continuing until age 46.  Hepatitis C blood test.** / For all people born from [birth year] through 1967 and any individual with known risks for hepatitis C.  Abdominal aortic aneurysm (AAA) screening.** / A one-time screening for ages 68 to 41 years who are current or former smokers.  Skin self-exam. / Monthly.  Influenza vaccine. / Every year.  Tetanus, diphtheria, and acellular pertussis (Tdap/Td) vaccine.** / 1 dose of Td every 10 years.  Varicella vaccine.** / Consult your health care provider.  Zoster vaccine.** / 1 dose for adults aged 10 years or older.  Pneumococcal 13-valent conjugate (PCV13) vaccine.** / 1 dose for all adults aged 62 years and older.  Pneumococcal polysaccharide (PPSV23) vaccine.** / 1 dose for all adults aged 40 years and older.  Meningococcal vaccine.** / Consult your health care provider.  Hepatitis A vaccine.** / Consult your health care provider.  Hepatitis B vaccine.** / Consult  your health care provider.  Haemophilus influenzae type b (Hib) vaccine.** / Consult your health care provider. **Family history and personal history of risk and conditions may change your health care provider's recommendations.   This information is not  intended to replace advice given to you by your health care provider. Make sure you discuss any questions you have with your health care provider.   Document Released: 03/19/2001 Document Revised: 02/11/2014 Document Reviewed: 06/18/2010 Elsevier Interactive Patient Education [employer].

## 2015-10-23 ENCOUNTER — Encounter: Payer: Self-pay | Admitting: Physician Assistant

## 2015-10-24 DIAGNOSIS — Z0000 Encounter for general adult medical examination without abnormal findings: Secondary | ICD-10-CM | POA: Insufficient documentation

## 2015-11-06 ENCOUNTER — Telehealth: Payer: Self-pay | Admitting: *Deleted

## 2015-11-06 ENCOUNTER — Other Ambulatory Visit: Payer: Self-pay | Admitting: Physician Assistant

## 2015-11-06 MED FILL — ALPRAZolam 1 MG TABS: 1 | 30 days supply | Qty: 60 | Fill #2

## 2015-11-06 MED FILL — metFORMIN HCL 1000 MG TABS: 1000 | 30 days supply | Qty: 60 | Fill #0

## 2015-11-06 MED FILL — RAMIPRIL 10 MG CAPSULE: 10 | 30 days supply | Qty: 60 | Fill #5

## 2015-11-06 MED FILL — SERTRALINE HCL 100 MG TAB: 100 | 30 days supply | Qty: 30 | Fill #2

## 2015-11-06 MED FILL — BUPROPION HCL XL 300 MG TAB: 300 | 30 days supply | Qty: 30 | Fill #0

## 2015-11-06 MED FILL — ATORVASTATIN 40 MG TABLET: 40 | 30 days supply | Qty: 30 | Fill #1

## 2015-11-06 MED FILL — FENOFIBRATE 145 MG TABLET: 145 | 30 days supply | Qty: 30 | Fill #3

## 2015-11-06 NOTE — Telephone Encounter (Signed)
Attempt to reach pt via phone, both numbers stated "have reached a line that the VM has not yet been set up"; called spouse number [Mary] and LMOVM for return call to clarify the number of units that patient is currently taking of Basaglar daily, so that we may get the Rx fill authorization to pharmacy in a timely manner/SLS 10/02

## 2015-11-07 ENCOUNTER — Encounter: Payer: Self-pay | Admitting: Physician Assistant

## 2015-11-07 DIAGNOSIS — M9903 Segmental and somatic dysfunction of lumbar region: Secondary | ICD-10-CM | POA: Diagnosis not present

## 2015-11-07 DIAGNOSIS — M62830 Muscle spasm of back: Secondary | ICD-10-CM | POA: Diagnosis not present

## 2015-11-07 DIAGNOSIS — M5416 Radiculopathy, lumbar region: Secondary | ICD-10-CM | POA: Diagnosis not present

## 2015-11-07 DIAGNOSIS — M545 Low back pain: Secondary | ICD-10-CM | POA: Diagnosis not present

## 2015-11-07 NOTE — Progress Notes (Unsigned)
Received from [facility], Dr. 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

## 2015-11-13 ENCOUNTER — Telehealth: Payer: Self-pay | Admitting: Physician Assistant

## 2015-11-13 ENCOUNTER — Encounter: Payer: Self-pay | Admitting: Physician Assistant

## 2015-11-13 NOTE — Telephone Encounter (Signed)
Copy & pasted into My Chart Message RE: same issue; forwarded to provider/SLS 10/09

## 2015-11-13 NOTE — Telephone Encounter (Signed)
Patient would like a call back in reference to 9/15 lab results. Will have phone with him all evening.

## 2015-11-13 NOTE — Telephone Encounter (Signed)
William [name] 2 hours ago (3:21 PM)    Patient would like a call back in reference to 9/15 lab results. Will have phone with him all evening.

## 2015-11-14 NOTE — Telephone Encounter (Signed)
I am awaiting clarification from provider on patient's medications [Basaglar and/or Tresiba] to make phone call/SLS 10/10

## 2015-11-29 ENCOUNTER — Telehealth: Payer: Self-pay | Admitting: Physician Assistant

## 2015-11-29 NOTE — Telephone Encounter (Signed)
Patient returning your call from yesterday call back [phone]

## 2015-11-29 NOTE — Telephone Encounter (Signed)
Spoke with patient.

## 2015-12-06 ENCOUNTER — Other Ambulatory Visit: Payer: Self-pay | Admitting: Physician Assistant

## 2015-12-06 MED FILL — SERTRALINE HCL 100 MG TAB: 100 | 30 days supply | Qty: 30 | Fill #0

## 2015-12-06 MED FILL — ATORVASTATIN 40 MG TABLET: 40 | 30 days supply | Qty: 30 | Fill #2

## 2015-12-06 MED FILL — BUPROPION HCL XL 300 MG TAB: 300 | 30 days supply | Qty: 30 | Fill #1

## 2015-12-07 ENCOUNTER — Telehealth: Payer: Self-pay | Admitting: Physician Assistant

## 2015-12-07 NOTE — Telephone Encounter (Signed)
Pharmacy calling to request a refill of patient's Insulin Glargine (BASAGLAR KWIKPEN) 100 UNIT/ML SOPN Please advise.    Pharmacy:  [place], [place] [address]

## 2015-12-08 MED ORDER — BASAGLAR KWIKPEN 100 UNIT/ML ~~LOC~~ SOPN: 100.0000 [IU] | PEN_INJECTOR | SUBCUTANEOUS | 2 refills | Status: DC

## 2015-12-08 MED FILL — ALPRAZolam 1 MG TABS: 1 | 30 days supply | Qty: 60 | Fill #0

## 2015-12-08 MED FILL — BASAGLAR 100 UNIT/ML KWIKPE: 100 | 90 days supply | Qty: 90 | Fill #0

## 2015-12-08 NOTE — Telephone Encounter (Signed)
Rx request to pharmacy/SLS  

## 2015-12-12 DIAGNOSIS — M9903 Segmental and somatic dysfunction of lumbar region: Secondary | ICD-10-CM | POA: Diagnosis not present

## 2015-12-12 DIAGNOSIS — M5416 Radiculopathy, lumbar region: Secondary | ICD-10-CM | POA: Diagnosis not present

## 2015-12-12 DIAGNOSIS — M62830 Muscle spasm of back: Secondary | ICD-10-CM | POA: Diagnosis not present

## 2015-12-12 DIAGNOSIS — M545 Low back pain: Secondary | ICD-10-CM | POA: Diagnosis not present

## 2016-01-03 ENCOUNTER — Encounter: Payer: Self-pay | Admitting: Physician Assistant

## 2016-01-03 ENCOUNTER — Other Ambulatory Visit: Payer: Self-pay | Admitting: Physician Assistant

## 2016-01-03 MED FILL — ALPRAZolam 1 MG TABS: 1 | 30 days supply | Qty: 60 | Fill #0

## 2016-01-03 NOTE — Telephone Encounter (Signed)
Refill granted.  Needs CSC signed and UDS so he will have to pick up at the office.

## 2016-01-04 ENCOUNTER — Encounter: Payer: Self-pay | Admitting: Physician Assistant

## 2016-01-08 ENCOUNTER — Encounter: Payer: Self-pay | Admitting: Physician Assistant

## 2016-01-12 ENCOUNTER — Ambulatory Visit (AMBULATORY_SURGERY_CENTER): Payer: Self-pay

## 2016-01-12 ENCOUNTER — Other Ambulatory Visit: Payer: Self-pay | Admitting: Physician Assistant

## 2016-01-12 VITALS — Ht 72.0 in | Wt 234.0 lb

## 2016-01-12 DIAGNOSIS — Z1211 Encounter for screening for malignant neoplasm of colon: Secondary | ICD-10-CM

## 2016-01-12 MED ORDER — SUPREP BOWEL PREP KIT 17.5-3.13-1.6 GM/177ML PO SOLN: 1.0000 | Freq: Once | ORAL | 0 refills | Status: AC

## 2016-01-12 MED FILL — ATORVASTATIN 40 MG TABLET: 40 | 30 days supply | Qty: 30 | Fill #3

## 2016-01-12 MED FILL — BUPROPION HCL XL 300 MG TAB: 300 | 30 days supply | Qty: 30 | Fill #2

## 2016-01-12 MED FILL — SERTRALINE HCL 100 MG TAB: 100 | 30 days supply | Qty: 30 | Fill #1

## 2016-01-12 MED FILL — FENOFIBRATE 145 MG TABLET: 145 | 30 days supply | Qty: 30 | Fill #0

## 2016-01-12 MED FILL — SUPREP BOWEL PREP KIT: 17.5-3.13-1 | 1 days supply | Qty: 354 | Fill #0

## 2016-01-12 MED FILL — RAMIPRIL 10 MG CAPSULE: 10 | 30 days supply | Qty: 60 | Fill #0

## 2016-01-12 NOTE — Progress Notes (Signed)
No diet meds No home oxygen No past problems with anesthesia No allegies to eggs or soy  Declined emmi

## 2016-01-19 ENCOUNTER — Encounter: Payer: Self-pay | Admitting: Internal Medicine

## 2016-01-19 ENCOUNTER — Telehealth: Payer: Self-pay | Admitting: *Deleted

## 2016-01-19 ENCOUNTER — Ambulatory Visit (AMBULATORY_SURGERY_CENTER): Payer: Federal, State, Local not specified - PPO | Admitting: Internal Medicine

## 2016-01-19 VITALS — BP 127/67 | HR 74 | Temp 98.0°F | Resp 14 | Ht 72.0 in | Wt 234.0 lb

## 2016-01-19 DIAGNOSIS — Z1212 Encounter for screening for malignant neoplasm of rectum: Secondary | ICD-10-CM

## 2016-01-19 DIAGNOSIS — D122 Benign neoplasm of ascending colon: Secondary | ICD-10-CM

## 2016-01-19 DIAGNOSIS — Z1211 Encounter for screening for malignant neoplasm of colon: Secondary | ICD-10-CM | POA: Diagnosis not present

## 2016-01-19 DIAGNOSIS — K649 Unspecified hemorrhoids: Secondary | ICD-10-CM

## 2016-01-19 MED ORDER — HYDROCORTISONE ACETATE 25 MG RE SUPP: 25.0000 mg | Freq: Two times a day (BID) | RECTAL | 1 refills | Status: DC

## 2016-01-19 MED ORDER — SODIUM CHLORIDE 0.9 % IV SOLN: 500.0000 mL | INTRAVENOUS | Status: DC

## 2016-01-19 MED FILL — ANUCORT-HC 25 MG SUPPOSITOR: 25 | 6 days supply | Qty: 12 | Fill #0

## 2016-01-19 NOTE — Patient Instructions (Signed)
YOU HAD AN ENDOSCOPIC PROCEDURE TODAY AT THE [facility]:   Refer to the procedure report that was given to you for any specific questions about what was found during the examination.  If the procedure report does not answer your questions, please call your gastroenterologist to clarify.  If you requested that your care partner not be given the details of your procedure findings, then the procedure report has been included in a sealed envelope for you to review at your convenience later.  YOU SHOULD EXPECT: Some feelings of bloating in the abdomen. Passage of more gas than usual.  Walking can help get rid of the air that was put into your GI tract during the procedure and reduce the bloating. If you had a lower endoscopy (such as a colonoscopy or flexible sigmoidoscopy) you may notice spotting of blood in your stool or on the toilet paper. If you underwent a bowel prep for your procedure, you may not have a normal bowel movement for a few days.  Please Note:  You might notice some irritation and congestion in your nose or some drainage.  This is from the oxygen used during your procedure.  There is no need for concern and it should clear up in a day or so.  SYMPTOMS TO REPORT IMMEDIATELY:   Following lower endoscopy (colonoscopy or flexible sigmoidoscopy):  Excessive amounts of blood in the stool  Significant tenderness or worsening of abdominal pains  Swelling of the abdomen that is new, acute  Fever of 100F or higher    For urgent or emergent issues, a gastroenterologist can be reached at any hour by calling [phone].   DIET:  We do recommend a small meal at first, but then you may proceed to your regular diet.  Drink plenty of fluids but you should avoid alcoholic beverages for 24 hours.  ACTIVITY:  You should plan to take it easy for the rest of today and you should NOT DRIVE or use heavy machinery until tomorrow (because of the sedation medicines used during the test).     FOLLOW UP: Our staff will call the number listed on your records the next business day following your procedure to check on you and address any questions or concerns that you may have regarding the information given to you following your procedure. If we do not reach you, we will leave a message.  However, if you are feeling well and you are not experiencing any problems, there is no need to return our call.  We will assume that you have returned to your regular daily activities without incident.  If any biopsies were taken you will be contacted by phone or by letter within the next 1-3 weeks.  Please call us at [phone] if you have not heard about the biopsies in 3 weeks.    SIGNATURES/CONFIDENTIALITY: You and/or your care partner have signed paperwork which will be entered into your electronic medical record.  These signatures attest to the fact that that the information above on your After Visit Summary has been reviewed and is understood.  Full responsibility of the confidentiality of this discharge information lies with you and/or your care-partner.   Information on polyps and hemorrhoids given to you today.  

## 2016-01-19 NOTE — Progress Notes (Signed)
Called to room to assist during endoscopic procedure.  Patient ID and intended procedure confirmed with present staff. Received instructions for my participation in the procedure from the performing physician.  

## 2016-01-19 NOTE — Telephone Encounter (Signed)
Hemorrhoidal banding #1 has been scheduled for 03/20/16 at 3:30 pm. No further availabilities on schedule at this time. We will schedule his next 2 appointments after the initial banding on the 14th. Kieth Brightly, RN in [facility] states she will advise patient of appointment time and date.

## 2016-01-19 NOTE — Op Note (Signed)
[facility] Patient Name: William [name] Procedure Date: 01/19/2016 2:44 PM MRN: [id number] Endoscopist: Jerene Bears , MD Age: 55 Referring MD:  Date of Birth: 1960/09/06 Gender: Male Account #: [id number] Procedure:                Colonoscopy Indications:              Screening for colorectal malignant neoplasm Medicines:                Monitored Anesthesia Care Procedure:                Pre-Anesthesia Assessment:                           - Prior to the procedure, a History and Physical                            was performed, and patient medications and                            allergies were reviewed. The patient's tolerance of                            previous anesthesia was also reviewed. The risks                            and benefits of the procedure and the sedation                            options and risks were discussed with the patient.                            All questions were answered, and informed consent                            was obtained. Prior Anticoagulants: The patient has                            taken no previous anticoagulant or antiplatelet                            agents. ASA Grade Assessment: II - A patient with                            mild systemic disease. After reviewing the risks                            and benefits, the patient was deemed in                            satisfactory condition to undergo the procedure.                           After obtaining informed consent, the colonoscope  was passed under direct vision. Throughout the                            procedure, the patient's blood pressure, pulse, and                            oxygen saturations were monitored continuously. The                            Model PCF-H190L [id number]) scope was introduced                            through the anus and advanced to the the cecum,                            identified by  appendiceal orifice and ileocecal                            valve. The colonoscopy was performed without                            difficulty. The patient tolerated the procedure                            well. The quality of the bowel preparation was                            good. The ileocecal valve, appendiceal orifice, and                            rectum were photographed. Scope In: 2:55:02 PM Scope Out: 3:11:41 PM Scope Withdrawal Time: 0 hours 11 minutes 7 seconds  Total Procedure Duration: 0 hours 16 minutes 39 seconds  Findings:                 The perianal and digital rectal examinations were                            normal.                           Two sessile polyps were found in the ascending                            colon. The polyps were 3 to 4 mm in size. These                            polyps were removed with a cold snare. Resection                            and retrieval were complete.                           Internal hemorrhoids were found during  retroflexion. The hemorrhoids were small.                           The exam was otherwise without abnormality. Complications:            No immediate complications. Estimated Blood Loss:     Estimated blood loss was minimal. Impression:               - Two 3 to 4 mm polyps in the ascending colon,                            removed with a cold snare. Resected and retrieved.                           - Internal hemorrhoids.                           - The examination was otherwise normal. Recommendation:           - Patient has a contact number available for                            emergencies. The signs and symptoms of potential                            delayed complications were discussed with the                            patient. Return to normal activities tomorrow.                            Written discharge instructions were provided to the                             patient.                           - Resume previous diet.                           - Continue present medications.                           - Await pathology results.                           - Repeat colonoscopy is recommended. The                            colonoscopy date will be determined after pathology                            results from today's exam become available for                            review. Jerene Bears, MD 01/19/2016 3:14:24 PM This report has been signed electronically.

## 2016-01-19 NOTE — Telephone Encounter (Signed)
-----   Message from Jerene Bears, MD sent at 01/19/2016  3:32 PM EST ----- Regarding: banding appts Banding appts x 3 Internal hemorrhoids Next available JMP

## 2016-01-19 NOTE — Progress Notes (Signed)
A/ox3 pleased with MAC, report to Penny RN 

## 2016-01-22 ENCOUNTER — Telehealth: Payer: Self-pay | Admitting: *Deleted

## 2016-01-22 NOTE — Telephone Encounter (Signed)
Dr. Hilarie Fredrickson -- thank you for making me aware.   William [name] -- Patient would need to be seen before treatment determined.  Please try to call patient to get an appointment.

## 2016-01-22 NOTE — Telephone Encounter (Signed)
  Follow up Call-  Call back number 01/19/2016  Post procedure Call Back phone  # [phone]  Permission to leave phone message Yes  Some recent data might be hidden     Patient questions:  Do you have a fever, pain , or abdominal swelling? No. Pain Score  0 *  Have you tolerated food without any problems? Yes.    Have you been able to return to your normal activities? Yes.    Do you have any questions about your discharge instructions: Diet   No. Medications  No. Follow up visit  No.  Do you have questions or concerns about your Care? No.  Actions: * If pain score is 4 or above: No action needed, pain <4.  Patient stating he had a lymph node, under right arm, to 2 cm on Saturday. This node remains swollen. Patient denies any fever, sore throat or respiratory issues. Patient stating, "I think an antibiotic is in order. The only thing I ve had snipped was during procedure on Friday." Informed the patient that he would most likely need to see his PCP. Patient stating he does not have time for that. Patient again stating that Dr. Hilarie Fredrickson could call in an antibiotic to his pharmacy. Patient stating he can be reached after 0830 at [phone]. Patient denies a history of lymph node swelling. Note sent to Dr. Hilarie Fredrickson for advice.

## 2016-01-22 NOTE — Telephone Encounter (Signed)
Swollen LN under the arm is very unlikely to relate to his colonoscopy This needs to be examined before making a decision on appropriate medical treatment I agree that PCP is most appropriate for this issue I will copy Elyn Aquas

## 2016-01-23 ENCOUNTER — Encounter: Payer: Self-pay | Admitting: Physician Assistant

## 2016-01-23 ENCOUNTER — Ambulatory Visit (INDEPENDENT_AMBULATORY_CARE_PROVIDER_SITE_OTHER): Payer: Federal, State, Local not specified - PPO | Admitting: Physician Assistant

## 2016-01-23 VITALS — BP 118/60 | HR 109 | Temp 98.2°F | Resp 16 | Ht 72.0 in | Wt 230.0 lb

## 2016-01-23 DIAGNOSIS — Z794 Long term (current) use of insulin: Secondary | ICD-10-CM

## 2016-01-23 DIAGNOSIS — L03111 Cellulitis of right axilla: Secondary | ICD-10-CM | POA: Diagnosis not present

## 2016-01-23 DIAGNOSIS — E119 Type 2 diabetes mellitus without complications: Secondary | ICD-10-CM

## 2016-01-23 LAB — COMPREHENSIVE METABOLIC PANEL
ALBUMIN: 4.5 g/dL (ref 3.5–5.2)
ALK PHOS: 79 U/L (ref 39–117)
ALT: 50 U/L (ref 0–53)
AST: 34 U/L (ref 0–37)
BILIRUBIN TOTAL: 0.3 mg/dL (ref 0.2–1.2)
BUN: 18 mg/dL (ref 6–23)
CALCIUM: 9.7 mg/dL (ref 8.4–10.5)
CO2: 30 mEq/L (ref 19–32)
CREATININE: 0.9 mg/dL (ref 0.40–1.50)
Chloride: 100 mEq/L (ref 96–112)
GFR: 92.24 mL/min (ref >60.00)
Glucose, Bld: 171 mg/dL — ABNORMAL HIGH (ref 70–99)
Potassium: 4.6 mEq/L (ref 3.5–5.1)
Sodium: 138 mEq/L (ref 135–145)
TOTAL PROTEIN: 6.7 g/dL (ref 6.0–8.3)

## 2016-01-23 LAB — HEMOGLOBIN A1C: Hgb A1c MFr Bld: 8.9 % — ABNORMAL HIGH (ref 4.6–6.5)

## 2016-01-23 MED ORDER — SULFAMETHOXAZOLE-TRIMETHOPRIM 800-160 MG PO TABS: 1.0000 | ORAL_TABLET | Freq: Two times a day (BID) | ORAL | 0 refills | Status: DC

## 2016-01-23 MED FILL — SULFAMETHOXAZOLE-TMP DS TAB: 800-160 | 7 days supply | Qty: 14 | Fill #0

## 2016-01-23 NOTE — Progress Notes (Signed)
Patient presents to clinic today c/o pain and redness in the R axillary region starting this weekend. Denies trauma or injury to the area. Denies any drainage from the area but states the skin feels hard and warm. Denies fever, chills, malaise or fatigue.  Patient also overdue for repeat A1C. Is currently on Basaglar 100 units daily and Metformin. Endorses taking as directed. Is not checking fasting sugars as directed. States he will check rarely and averaging 150-170. Is up-to-date on eye exam and foot examination. Immunizations up-to-date.   Past Medical History:  Diagnosis Date  . Adjustment disorder with mixed anxiety and depressed mood   . Arthritis   . Asthma    history  . Diabetes mellitus    type 2  . Elevated blood pressure    history of high blood pressure readings  . History of chicken pox   . Hypercholesteremia     Current Outpatient Prescriptions on File Prior to Visit  Medication Sig Dispense Refill  . ALPRAZolam (XANAX) 1 MG tablet TAKE 1 TO 2 TABLETS BY MOUTH AT BEDTIME AS NEEDED FOR SLEEP OR ANXIETY 60 tablet 0  . atorvastatin (LIPITOR) 40 MG tablet TAKE 1 TABLET (40 MG TOTAL) BY MOUTH DAILY. 30 tablet 3  . Blood Glucose Monitoring Suppl (BAYER CONTOUR MONITOR) w/Device KIT Use with strips to check fasting sugar once daily. E 11.9 1 kit 0  . Blood Glucose Monitoring Suppl (ONE TOUCH ULTRA SYSTEM KIT) W/DEVICE KIT 1 kit by Does not apply route once. Use to check blood sugar four times daily [Patient not checking daily]    . buPROPion (WELLBUTRIN XL) 300 MG 24 hr tablet TAKE 1 TABLET BY MOUTH DAILY. 30 tablet 5  . fenofibrate (TRICOR) 145 MG tablet TAKE 1 TABLET (145 MG TOTAL) BY MOUTH DAILY. 30 tablet 5  . glucose blood (ACCU-CHEK AVIVA) test strip Use to check blood sugar 8 times per day. 300 each 2  . glucose blood (ACCU-CHEK COMPACT STRIPS) test strip Use as instructed to check blood sugar eight times a day. 300 each 2  . hydrocortisone (ANUSOL-HC) 25 MG  suppository Place 1 suppository (25 mg total) rectally every 12 (twelve) hours. 12 suppository 1  . Insulin Glargine (BASAGLAR KWIKPEN) 100 UNIT/ML SOPN Inject 1 mL (100 Units total) into the skin every morning. 90 mL 2  . Insulin Pen Needle (RELION PEN NEEDLE 31G/8MM) 31G X 8 MM MISC Use as Directed daily to check blood sugars 100 each 3  . Insulin Syringe-Needle U-100 30G 1 ML MISC USE AS DIRECTED TO INJECT INSULIN DX: 250.00 100 each 3  . metFORMIN (GLUCOPHAGE) 1000 MG tablet TAKE 1 TABLET BY MOUTH TWICE DAILY WITH A MEAL 60 tablet 0  . ONE TOUCH LANCETS MISC Use as directed 200 each 11  . ramipril (ALTACE) 10 MG capsule TAKE TWO CAPSULES BY MOUTH DAILY 60 capsule 5  . sertraline (ZOLOFT) 100 MG tablet TAKE 1 TABLET BY MOUTH DAILY 30 tablet 2  . sildenafil (REVATIO) 20 MG tablet TAKE 2-5 TABLETS DAILY AS NEEDED FOR ED - NO MORE THAN ONE DOSE IN 36 HOURS 50 tablet 0  . silver sulfADIAZINE (SILVADENE) 1 % cream Apply 1 application topically daily as needed. 50 g 0  . VIAGRA 100 MG tablet TAKE 1/2 TO 1 TABLET (50-100 MG TOTAL) BY MOUTH DAILY AS NEEDED FOR ERECTILE DYSFUNCTION. 4 tablet 2   Current Facility-Administered Medications on File Prior to Visit  Medication Dose Route Frequency Provider Last Rate Last Dose  .  0.9 %  sodium chloride infusion  500 mL Intravenous Continuous Jerene Bears, MD        No Known Allergies  Family History  Problem Relation Age of Onset  . Diabetes Mother   . Thyroid disease Mother     Questionable  . Heart disease Father     deceased  . Other Neg Hx     hypogonadism  . Colon cancer Neg Hx     Social History   Social History  . Marital status: Married    Spouse name: N/A  . Number of children: N/A  . Years of education: N/A   Social History Main Topics  . Smoking status: Never Smoker  . Smokeless tobacco: Never Used  . Alcohol use 4.2 - 8.4 oz/week    7 - 14 Glasses of wine per week  . Drug use: No  . Sexual activity: Not Asked   Other  Topics Concern  . None   Social History Narrative   Occupation: Engineer, site   Married -54 marriage (2nd marriage)   daughter 21,  2 sons (2nd marriage  11,6)   [place]   Never Smoked    Alcohol use-yes   Drug use-no    Regular exercise-no   Smoking Status:  never   Does Patient Exercise:  no   Caffeine use/day:  3-4 cups coffee daily   Drug Use:  no         Review of Systems - See HPI.  All other ROS are negative.  BP 118/60   Pulse (!) 109   Temp 98.2 F (36.8 C) (Oral)   Resp 16   Ht 6' (1.829 m)   Wt 230 lb (104.3 kg)   SpO2 92%   BMI 31.19 kg/m   Physical Exam  Constitutional: He is oriented to person, place, and time and well-developed, well-nourished, and in no distress.  HENT:  Head: Normocephalic and atraumatic.  Eyes: Conjunctivae are normal.  Neck: Neck supple.  Cardiovascular: Normal rate, regular rhythm, normal heart sounds and intact distal pulses.   Pulmonary/Chest: Effort normal and breath sounds normal. No respiratory distress. He has no wheezes. He has no rales. He exhibits no tenderness.  Lymphadenopathy:    He has no cervical adenopathy.  Neurological: He is alert and oriented to person, place, and time.  Skin: Skin is warm and dry.     Psychiatric: Affect normal.  Vitals reviewed.  Assessment/Plan: 1. Type 2 diabetes mellitus without complication, with long-term current use of insulin ([place]) Repeat labs today. Discussed checking fasting sugars daily. Goals reviewed. Titration of Basaglar discussed. Will alter regimen based on results.  - Hemoglobin A1c - Comp Met (CMET)  2. Cellulitis of right axilla Rx Bactrim. Supportive measures and close follow-up discussed.  - sulfamethoxazole-trimethoprim (BACTRIM DS,SEPTRA DS) 800-160 MG tablet; Take 1 tablet by mouth 2 (two) times daily.  Dispense: 14 tablet; Refill: 0   Leeanne Rio, [place]

## 2016-01-23 NOTE — Patient Instructions (Addendum)
Please go to the lab for blood work. I will call you with your results.  Start checking your fastin sugars each day and record. Goal is 100-120 consistently. Increase Basaglar by 2 units every 3-4 days until fasting sugars are in this range. Do not increase more quickly.  Follow-up 1 month for reassessment.  Please take the Bactrim as directed for the infection. Apply warm compresses to the area to promote any drainage. If symptoms are not improving within 48 hours of starting medication or anything worsens, please return immediately.   If you note any fever, please go to the ER.

## 2016-01-23 NOTE — Progress Notes (Signed)
Pre visit review using our clinic review tool, if applicable. No additional management support is needed unless otherwise documented below in the visit note. 

## 2016-01-26 ENCOUNTER — Encounter: Payer: Self-pay | Admitting: Internal Medicine

## 2016-02-06 ENCOUNTER — Other Ambulatory Visit: Payer: Self-pay | Admitting: Physician Assistant

## 2016-02-06 DIAGNOSIS — M5416 Radiculopathy, lumbar region: Secondary | ICD-10-CM | POA: Diagnosis not present

## 2016-02-06 DIAGNOSIS — M62830 Muscle spasm of back: Secondary | ICD-10-CM | POA: Diagnosis not present

## 2016-02-06 DIAGNOSIS — M9903 Segmental and somatic dysfunction of lumbar region: Secondary | ICD-10-CM | POA: Diagnosis not present

## 2016-02-06 DIAGNOSIS — M545 Low back pain: Secondary | ICD-10-CM | POA: Diagnosis not present

## 2016-02-06 MED FILL — SERTRALINE HCL 100 MG TAB: 100 | 30 days supply | Qty: 30 | Fill #2

## 2016-02-06 MED FILL — BUPROPION HCL XL 300 MG TAB: 300 | 30 days supply | Qty: 30 | Fill #3

## 2016-02-06 MED FILL — FENOFIBRATE 145 MG TABLET: 145 | 30 days supply | Qty: 30 | Fill #1

## 2016-02-07 MED FILL — ATORVASTATIN 40 MG TABLET: 40 | 30 days supply | Qty: 30 | Fill #0

## 2016-02-08 ENCOUNTER — Other Ambulatory Visit: Payer: Self-pay | Admitting: Physician Assistant

## 2016-02-08 ENCOUNTER — Encounter: Payer: Self-pay | Admitting: Physician Assistant

## 2016-02-08 MED ORDER — METFORMIN HCL 1000 MG PO TABS: ORAL_TABLET | ORAL | 3 refills | Status: DC

## 2016-02-08 MED ORDER — ALPRAZOLAM 1 MG PO TABS: ORAL_TABLET | ORAL | 0 refills | Status: DC

## 2016-02-08 MED FILL — metFORMIN HCL 1000 MG TABS: 1000 | 30 days supply | Qty: 60 | Fill #0

## 2016-02-08 MED FILL — ALPRAZolam 1 MG TABS: 1 | 30 days supply | Qty: 60 | Fill #0

## 2016-02-08 NOTE — Telephone Encounter (Signed)
Xanax last filled 01/03/16 # 60 Last ov: 01/23/16 to f/u in 1 month Please advise of refill

## 2016-02-08 NOTE — Telephone Encounter (Signed)
Pt calling to check status of refill request for alprazolam, stating he initiated the request on Monday.  I informed patient the only request I see was the one received this afternoon.   Patient states he medication refilled today.   Pharmacy: [place], [place] - [address] [phone] (Phone) [phone] (Fax)

## 2016-02-08 NOTE — Telephone Encounter (Signed)
Verbally called the Alprazolam into the [facility] and notified patient that the medication was refilled.

## 2016-02-08 NOTE — Telephone Encounter (Signed)
Patina -- ok to call in refill of Xanax (see medication order I just placed).

## 2016-02-23 DIAGNOSIS — E119 Type 2 diabetes mellitus without complications: Secondary | ICD-10-CM | POA: Diagnosis not present

## 2016-02-23 DIAGNOSIS — S79911A Unspecified injury of right hip, initial encounter: Secondary | ICD-10-CM | POA: Diagnosis not present

## 2016-02-23 DIAGNOSIS — S299XXA Unspecified injury of thorax, initial encounter: Secondary | ICD-10-CM | POA: Diagnosis not present

## 2016-02-23 DIAGNOSIS — Z7984 Long term (current) use of oral hypoglycemic drugs: Secondary | ICD-10-CM | POA: Insufficient documentation

## 2016-02-23 DIAGNOSIS — Y929 Unspecified place or not applicable: Secondary | ICD-10-CM | POA: Diagnosis not present

## 2016-02-23 DIAGNOSIS — Y939 Activity, unspecified: Secondary | ICD-10-CM | POA: Diagnosis not present

## 2016-02-23 DIAGNOSIS — Z79899 Other long term (current) drug therapy: Secondary | ICD-10-CM | POA: Insufficient documentation

## 2016-02-23 DIAGNOSIS — S20211A Contusion of right front wall of thorax, initial encounter: Secondary | ICD-10-CM | POA: Insufficient documentation

## 2016-02-23 DIAGNOSIS — J45909 Unspecified asthma, uncomplicated: Secondary | ICD-10-CM | POA: Diagnosis not present

## 2016-02-23 DIAGNOSIS — I10 Essential (primary) hypertension: Secondary | ICD-10-CM | POA: Diagnosis not present

## 2016-02-23 DIAGNOSIS — W010XXA Fall on same level from slipping, tripping and stumbling without subsequent striking against object, initial encounter: Secondary | ICD-10-CM | POA: Insufficient documentation

## 2016-02-23 DIAGNOSIS — Y999 Unspecified external cause status: Secondary | ICD-10-CM | POA: Insufficient documentation

## 2016-02-23 DIAGNOSIS — R0781 Pleurodynia: Secondary | ICD-10-CM | POA: Diagnosis not present

## 2016-02-23 DIAGNOSIS — M25551 Pain in right hip: Secondary | ICD-10-CM | POA: Diagnosis not present

## 2016-02-24 ENCOUNTER — Encounter (HOSPITAL_BASED_OUTPATIENT_CLINIC_OR_DEPARTMENT_OTHER): Payer: Self-pay | Admitting: *Deleted

## 2016-02-24 ENCOUNTER — Emergency Department (HOSPITAL_BASED_OUTPATIENT_CLINIC_OR_DEPARTMENT_OTHER)
Admission: EM | Admit: 2016-02-24 | Discharge: 2016-02-24 | Disposition: A | Discharge status: Home / Self Care | Payer: Federal, State, Local not specified - PPO | Attending: Emergency Medicine | Admitting: Emergency Medicine

## 2016-02-24 ENCOUNTER — Emergency Department (HOSPITAL_BASED_OUTPATIENT_CLINIC_OR_DEPARTMENT_OTHER): Payer: Federal, State, Local not specified - PPO

## 2016-02-24 DIAGNOSIS — S79911A Unspecified injury of right hip, initial encounter: Secondary | ICD-10-CM | POA: Diagnosis not present

## 2016-02-24 DIAGNOSIS — T148XXA Other injury of unspecified body region, initial encounter: Secondary | ICD-10-CM

## 2016-02-24 DIAGNOSIS — S299XXA Unspecified injury of thorax, initial encounter: Secondary | ICD-10-CM | POA: Diagnosis not present

## 2016-02-24 DIAGNOSIS — M25551 Pain in right hip: Secondary | ICD-10-CM | POA: Diagnosis not present

## 2016-02-24 DIAGNOSIS — S20211A Contusion of right front wall of thorax, initial encounter: Secondary | ICD-10-CM

## 2016-02-24 DIAGNOSIS — R0781 Pleurodynia: Secondary | ICD-10-CM | POA: Diagnosis not present

## 2016-02-24 HISTORY — DX: Essential (primary) hypertension: I10

## 2016-02-24 MED ORDER — MELOXICAM 15 MG PO TABS: 15.0000 mg | ORAL_TABLET | Freq: Every day | ORAL | 0 refills | Status: DC

## 2016-02-24 MED ORDER — TRAMADOL HCL 50 MG PO TABS: 50.0000 mg | ORAL_TABLET | Freq: Four times a day (QID) | ORAL | 0 refills | Status: DC | PRN

## 2016-02-24 MED ORDER — HYDROCODONE-ACETAMINOPHEN 5-325 MG PO TABS: 2.0000 | ORAL_TABLET | Freq: Four times a day (QID) | ORAL | 0 refills | Status: DC | PRN

## 2016-02-24 MED ORDER — KETOROLAC TROMETHAMINE 60 MG/2ML IM SOLN
60.0000 mg | Freq: Once | INTRAMUSCULAR | Status: AC
  Administered 2016-02-24: 60 mg via INTRAMUSCULAR
  Filled 2016-02-24: qty 2

## 2016-02-24 MED ORDER — LIDOCAINE 5 % EX PTCH: 1.0000 | MEDICATED_PATCH | CUTANEOUS | 0 refills | Status: DC

## 2016-02-24 NOTE — ED Notes (Signed)
Slipped on ice fell on to concrete  C/o pain to rt ribs and rt hip pain and swelling,  Hx rt hip replacement

## 2016-02-24 NOTE — ED Provider Notes (Signed)
[facility] Provider Note   CSN: [id number] Arrival date & time: 02/23/16  2353     History   Chief Complaint Chief Complaint  Patient presents with  . Hip Pain  . Rib Injury    HPI William [name] is a 56 y.o. male.  The history is provided by the patient. No language interpreter was used.  Fall  This is a new problem. The current episode started 6 to 12 hours ago. The problem occurs constantly. The problem has not changed since onset.Pertinent negatives include no chest pain, no abdominal pain, no headaches and no shortness of breath. Nothing aggravates the symptoms. Nothing relieves the symptoms. He has tried nothing for the symptoms. The treatment provided no relief.    Past Medical History:  Diagnosis Date  . Adjustment disorder with mixed anxiety and depressed mood   . Arthritis   . Asthma    history  . Diabetes mellitus    type 2  . Elevated blood pressure    history of high blood pressure readings  . History of chicken pox   . Hypercholesteremia   . Hypertension     Patient Active Problem List   Diagnosis Date Noted  . Visit for preventive health examination 10/24/2015  . Need for prophylactic vaccination and inoculation against influenza 10/20/2013  . Erectile dysfunction 01/31/2013  . Testosterone deficiency 05/27/2011  . Diabetes mellitus ([place]) 11/24/2009  . Hyperlipemia 11/24/2009  . ADJUSTMENT DISORDER WITH ANXIOUS MOOD 11/24/2009  . Essential hypertension 11/24/2009    Past Surgical History:  Procedure Laterality Date  . ELBOW SURGERY     right elbow 2010, left elbow 08/06/10 [name])  . KNEE SURGERY     right knee new ACL- 1993,2000  . KNEE SURGERY     left knee 2003  . NASAL SEPTUM SURGERY     1992  . PARTIAL HIP ARTHROPLASTY     right hip replacement       Home Medications    Prior to Admission medications   Medication Sig Start Date End Date Taking? Authorizing Provider  ALPRAZolam Duanne Moron) 1 MG tablet TAKE 1 TO  2 TABLETS BY MOUTH AT BEDTIME AS NEEDED FOR SLEEP OR ANXIETY 02/08/16   Brunetta Jeans, PA-C  atorvastatin (LIPITOR) 40 MG tablet TAKE 1 TABLET (40 MG TOTAL) BY MOUTH DAILY. 02/07/16   Brunetta Jeans, PA-C  Blood Glucose Monitoring Suppl (BAYER CONTOUR MONITOR) w/Device KIT Use with strips to check fasting sugar once daily. E 11.9 10/20/15   Brunetta Jeans, PA-C  Blood Glucose Monitoring Suppl (ONE TOUCH ULTRA SYSTEM KIT) W/DEVICE KIT 1 kit by Does not apply route once. Use to check blood sugar four times daily [Patient not checking daily]    Historical Provider, MD  buPROPion (WELLBUTRIN XL) 300 MG 24 hr tablet TAKE 1 TABLET BY MOUTH DAILY. 11/06/15   Brunetta Jeans, PA-C  fenofibrate (TRICOR) 145 MG tablet TAKE 1 TABLET (145 MG TOTAL) BY MOUTH DAILY. 01/12/16   Brunetta Jeans, PA-C  glucose blood (ACCU-CHEK AVIVA) test strip Use to check blood sugar 8 times per day. 10/04/14   Renato Shin, MD  glucose blood (ACCU-CHEK COMPACT STRIPS) test strip Use as instructed to check blood sugar eight times a day. 09/21/13   Brunetta Jeans, PA-C  Insulin Glargine (BASAGLAR KWIKPEN) 100 UNIT/ML SOPN Inject 1 mL (100 Units total) into the skin every morning. 12/08/15   Brunetta Jeans, PA-C  Insulin Pen Needle (RELION PEN NEEDLE  31G/8MM) 31G X 8 MM MISC Use as Directed daily to check blood sugars 10/26/12   Debbrah Alar, NP  Insulin Syringe-Needle U-100 30G 1 ML MISC USE AS DIRECTED TO INJECT INSULIN DX: 250.00 10/26/12   Debbrah Alar, NP  metFORMIN (GLUCOPHAGE) 1000 MG tablet TAKE 1 TABLET BY MOUTH TWICE DAILY WITH A MEAL 02/08/16   Brunetta Jeans, PA-C  ONE TOUCH LANCETS MISC Use as directed 10/26/12   Debbrah Alar, NP  ramipril (ALTACE) 10 MG capsule TAKE TWO CAPSULES BY MOUTH DAILY 01/12/16   Brunetta Jeans, PA-C  sertraline (ZOLOFT) 100 MG tablet TAKE 1 TABLET BY MOUTH DAILY 12/06/15   Gay Filler Copland, MD  silver sulfADIAZINE (SILVADENE) 1 % cream Apply 1 application topically daily as  needed. 07/07/15   Brunetta Jeans, PA-C    Family History Family History  Problem Relation Age of Onset  . Diabetes Mother   . Thyroid disease Mother     Questionable  . Heart disease Father     deceased  . Other Neg Hx     hypogonadism  . Colon cancer Neg Hx     Social History Social History  Substance Use Topics  . Smoking status: Never Smoker  . Smokeless tobacco: Never Used  . Alcohol use 4.2 - 8.4 oz/week    7 - 14 Glasses of wine per week     Allergies   Patient has no known allergies.   Review of Systems Review of Systems  Respiratory: Negative for cough, shortness of breath and wheezing.   Cardiovascular: Negative for chest pain.  Gastrointestinal: Negative for abdominal pain.  Musculoskeletal: Positive for arthralgias. Negative for back pain, gait problem and neck pain.  Neurological: Negative for weakness and headaches.  All other systems reviewed and are negative.    Physical Exam Updated Vital Signs BP 142/79 (BP Location: Left Arm)   Pulse 92   Temp 98.1 F (36.7 C) (Oral)   Resp 18   Ht 6' (1.829 m)   Wt 225 lb (102.1 kg)   SpO2 98%   BMI 30.52 kg/m   Physical Exam  Constitutional: He is oriented to person, place, and time. He appears well-developed and well-nourished. No distress.  HENT:  Head: Normocephalic and atraumatic.  Right Ear: External ear normal.  Left Ear: External ear normal.  Mouth/Throat: No oropharyngeal exudate.  No battle sign no raccoon sign  Eyes: EOM are normal. Pupils are equal, round, and reactive to light.  Neck: Normal range of motion. Neck supple.  Cardiovascular: Normal rate, regular rhythm and intact distal pulses.   Pulmonary/Chest: Effort normal and breath sounds normal. No respiratory distress. He has no wheezes. He has no rales.  No bruising or crepitance  Abdominal: Soft. Bowel sounds are normal. He exhibits no mass. There is no tenderness. There is no rebound and no guarding.  Musculoskeletal: Normal  range of motion. He exhibits no edema, tenderness or deformity.  Gait steady FROM of the RLQ  Neurological: He is alert and oriented to person, place, and time. He displays normal reflexes. No sensory deficit. He exhibits normal muscle tone. Coordination normal.  Skin: Skin is warm and dry. Capillary refill takes less than 2 seconds.  Psychiatric: He has a normal mood and affect.     ED Treatments / Results   Vitals:   02/24/16 0004 02/24/16 0421  BP: 142/79 121/83  Pulse: 92 91  Resp: 18 18  Temp: 98.1 F (36.7 C)     Radiology  Dg Ribs Unilateral W/chest Right  Result Date: 02/24/2016 CLINICAL DATA:  Fell on ice, rib pain EXAM: RIGHT RIBS AND CHEST - 3+ VIEW COMPARISON:  11/27/2010 FINDINGS: Single-view chest demonstrates no acute infiltrate or effusion. Heart size is normal. No pneumothorax. Right rib series demonstrates no acute displaced right rib fracture. IMPRESSION: 1. No pneumothorax or pleural effusion. 2. No definite acute displaced right rib fracture. Electronically Signed   By: Donavan Foil M.D.   On: 02/24/2016 01:44   Dg Hip Unilat W Or Wo Pelvis 2-3 Views Right  Result Date: 02/24/2016 CLINICAL DATA:  Slipped and fell on ice right hip pain EXAM: DG HIP (WITH OR WITHOUT PELVIS) 2-3V RIGHT COMPARISON:  None. FINDINGS: The SI joints appear symmetric. Pubic symphysis is intact. The patient is status post right hip replacement. No acute fracture or dislocation. IMPRESSION: Status post right hip replacement.  No acute fracture or dislocation Electronically Signed   By: Donavan Foil M.D.   On: 02/24/2016 01:43    Procedures Procedures (including critical care time)  Medications Ordered in ED Medications  ketorolac (TORADOL) injection 60 mg (not administered)   Final Clinical Impressions(s) / ED Diagnoses  Given a short course of pain medication with NSAIDs, ice for 20 minutes Q 2 hours.  Ambulate as you normally would and use incentive spirometer to prevent risk of PNA  from splinting secondary to pain. All questions answered to patient's satisfaction. Based on history and exam patient has been appropriately medically screened and emergency conditions excluded. Patient is stable for discharge at this time. Strict return precautions given for any further episodes, persistent fever, weakness or any concerns. New Prescriptions New Prescriptions   No medications on file   Allergies as of 02/24/2016   No Known Allergies     Medication List    TAKE these medications   HYDROcodone-acetaminophen 5-325 MG tablet Commonly known as:  NORCO Take 2 tablets by mouth every 6 (six) hours as needed.   lidocaine 5 % Commonly known as:  LIDODERM Place 1 patch onto the skin daily. Remove & Discard patch within 12 hours or as directed by MD   meloxicam 15 MG tablet Commonly known as:  MOBIC Take 1 tablet (15 mg total) by mouth daily.     ASK your doctor about these medications   ALPRAZolam 1 MG tablet Commonly known as:  XANAX TAKE 1 TO 2 TABLETS BY MOUTH AT BEDTIME AS NEEDED FOR SLEEP OR ANXIETY   atorvastatin 40 MG tablet Commonly known as:  LIPITOR TAKE 1 TABLET (40 MG TOTAL) BY MOUTH DAILY.   BASAGLAR KWIKPEN 100 UNIT/ML Sopn Inject 1 mL (100 Units total) into the skin every morning.   buPROPion 300 MG 24 hr tablet Commonly known as:  WELLBUTRIN XL TAKE 1 TABLET BY MOUTH DAILY.   fenofibrate 145 MG tablet Commonly known as:  TRICOR TAKE 1 TABLET (145 MG TOTAL) BY MOUTH DAILY.   glucose blood test strip Commonly known as:  ACCU-CHEK COMPACT STRIPS Use as instructed to check blood sugar eight times a day.   glucose blood test strip Commonly known as:  ACCU-CHEK AVIVA Use to check blood sugar 8 times per day.   Insulin Pen Needle 31G X 8 MM Misc Commonly known as:  RELION PEN NEEDLE 31G/8MM Use as Directed daily to check blood sugars   Insulin Syringe-Needle U-100 30G 1 ML Misc USE AS DIRECTED TO INJECT INSULIN DX: 250.00   metFORMIN 1000 MG  tablet Commonly known as:  GLUCOPHAGE  TAKE 1 TABLET BY MOUTH TWICE DAILY WITH A MEAL   ONE TOUCH LANCETS Misc Use as directed   ONE TOUCH ULTRA SYSTEM KIT w/Device Kit 1 kit by Does not apply route once. Use to check blood sugar four times daily [Patient not checking daily]   BAYER CONTOUR MONITOR w/Device Kit Use with strips to check fasting sugar once daily. E 11.9   ramipril 10 MG capsule Commonly known as:  ALTACE TAKE TWO CAPSULES BY MOUTH DAILY   sertraline 100 MG tablet Commonly known as:  ZOLOFT TAKE 1 TABLET BY MOUTH DAILY   silver sulfADIAZINE 1 % cream Commonly known as:  SILVADENE Apply 1 application topically daily as needed.        Veatrice Kells, MD 02/24/16 1254

## 2016-02-24 NOTE — ED Triage Notes (Signed)
Pt fell this Pm around 2130 onto his right side presents with right  Hip pain and swelling as well as right rib pain. Pt concerned about right hip replacement

## 2016-02-27 ENCOUNTER — Encounter: Payer: Self-pay | Admitting: *Deleted

## 2016-02-29 ENCOUNTER — Encounter: Payer: Self-pay | Admitting: Emergency Medicine

## 2016-02-29 ENCOUNTER — Encounter: Payer: Self-pay | Admitting: Physician Assistant

## 2016-02-29 ENCOUNTER — Telehealth: Payer: Self-pay | Admitting: *Deleted

## 2016-02-29 ENCOUNTER — Ambulatory Visit (INDEPENDENT_AMBULATORY_CARE_PROVIDER_SITE_OTHER): Payer: Federal, State, Local not specified - PPO | Admitting: Physician Assistant

## 2016-02-29 VITALS — BP 140/82 | HR 88 | Temp 97.6°F | Resp 16 | Ht 72.0 in | Wt 233.0 lb

## 2016-02-29 DIAGNOSIS — Z6831 Body mass index (BMI) 31.0-31.9, adult: Secondary | ICD-10-CM

## 2016-02-29 DIAGNOSIS — Z794 Long term (current) use of insulin: Secondary | ICD-10-CM

## 2016-02-29 DIAGNOSIS — E669 Obesity, unspecified: Secondary | ICD-10-CM

## 2016-02-29 DIAGNOSIS — R040 Epistaxis: Secondary | ICD-10-CM | POA: Diagnosis not present

## 2016-02-29 DIAGNOSIS — E119 Type 2 diabetes mellitus without complications: Secondary | ICD-10-CM

## 2016-02-29 DIAGNOSIS — I10 Essential (primary) hypertension: Secondary | ICD-10-CM

## 2016-02-29 MED ORDER — LIDOCAINE 5 % EX PTCH: 1.0000 | MEDICATED_PATCH | CUTANEOUS | 0 refills | Status: DC

## 2016-02-29 MED ORDER — HYDROCODONE-ACETAMINOPHEN 10-325 MG PO TABS: 1.0000 | ORAL_TABLET | Freq: Two times a day (BID) | ORAL | 0 refills | Status: DC | PRN

## 2016-02-29 MED ORDER — LISINOPRIL-HYDROCHLOROTHIAZIDE 10-12.5 MG PO TABS: 1.0000 | ORAL_TABLET | Freq: Every day | ORAL | 1 refills | Status: DC

## 2016-02-29 MED FILL — HYDROCODON-APAP 10-325: 10-325 | 5 days supply | Qty: 10 | Fill #0

## 2016-02-29 MED FILL — LISINOPRIL-HCTZ 10-12.5 MG: 10-12.5 | 30 days supply | Qty: 30 | Fill #0

## 2016-02-29 NOTE — Progress Notes (Signed)
Pre visit review using our clinic review tool, if applicable. No additional management support is needed unless otherwise documented below in the visit note. 

## 2016-02-29 NOTE — Telephone Encounter (Signed)
I have not forgotten.  Discussed with patient that note would be sent to MyChart as soon as I was able to complete as it was requested after his visit.

## 2016-02-29 NOTE — Patient Instructions (Signed)
Please increase fluids. Place a humidifier in the bedroom.  Use Vaseline as we discussed to help moisten the inside of your nose.   For Blood pressure -- This is still above goal. I am making a change to your regimen. Stop the Ramipril. I am starting your on Lisinopril-HCTZ to get better control of your BP. Please eat a low-salt diet. Follow-up with me in 2 weeks for reassessment.   Continue pain medication as directed if needed for moderate-severe pain. Avoid laying on affected side. Use ACE wrap to add compression to the ribs. If you note any cough or chest congestion I would want to see you due to increased risk for upper respiratory infection.   For diabetes -- I am setting you up with Endocrinology so we can get this taking care of ASAP. Please start increasing the Basaglar by 2 units every 3 days until fasting sugars are averaging 130-150 (this is our first goal). Call me in 1 week with your sugar readings. If you get any sugars lower than 80, decrease by 3 units and call me. Continue Metformin as directed.   I am setting you up with a Bariatric consult at your request. I am not sure what they will offer giving your BMI is just in the slightly obese category but we will see! Try to work on exercise (increasing to 150 minutes per week if possible).

## 2016-02-29 NOTE — Telephone Encounter (Signed)
Patient called stating that he was supposed to receive a letter on MyChart for an adjustable desk for work.  He  Did not see it this afternoon so he was just calling as a reminder.

## 2016-02-29 NOTE — Progress Notes (Signed)
Patient presents to clinic today as ER follow-up. Patient also following up for chronic health issues.  Patient presented to ER on 02/24/2016 after falling on black ice at landing on R side with injury to ribs and hip. Denies head trauma or loss of consciousness. Patient presented to ER for assessment. Workup included x-rays (R rib series and R hip) without sign of fracture. Patient given pain medications and home instructions, was told to follow-up with PCP. Patient endorses pain is improving since that time. Still notes significant bruising of R side that is improving. Notes tenderness over this R hip and side up to the ribs. Endorses some pain with deep breathing or coughing. Denies fever, chills, chest congestion or cough. Has been taking tylenol for pain with some relief.   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 Patient currently on regimen of Metformin 1000 mg BID and Basaglar. Last A1C at 8.9. Patient was instructed to titrate up Basaglar by 2 units every 3 nights until fasting sugars consistently averaging 100-130. Patient states he felt that he did not need as much insulin so instead of titration as directed, he decreased units from 100 to 80. Notes fasting sugars averaging in the mid 200s despite consistent diet. Is taking ramipril as directed. Foot exam and eye examination up-to-date. Patient up-to-date on immunizations.  Lab Results  Component Value Date   HGBA1C 8.9 (H) 01/23/2016   Hypertension -- Patient currently on Ramipril 10 mg twice daily. Is taking as directed. Patient denies chest pain, palpitations, lightheadedness, dizziness, vision changes or frequent headaches.  BP Readings from Last 3 Encounters:  02/29/16 140/82  02/24/16 121/83  01/23/16 118/60   Patient also noting nasal dryness at night with nosebleed occurring yesterday. Denies sinus pressure/pain or ear pressure/pain.   Past Medical History:  Diagnosis Date  . Adjustment disorder with mixed  anxiety and depressed mood   . Arthritis   . Asthma    history  . Diabetes mellitus    type 2  . Elevated blood pressure    history of high blood pressure readings  . History of chicken pox   . Hypercholesteremia   . Hypertension   . Internal hemorrhoids   . Tubular adenoma of colon     Current Outpatient Prescriptions on File Prior to Visit  Medication Sig Dispense Refill  . ALPRAZolam (XANAX) 1 MG tablet TAKE 1 TO 2 TABLETS BY MOUTH AT BEDTIME AS NEEDED FOR SLEEP OR ANXIETY 60 tablet 0  . atorvastatin (LIPITOR) 40 MG tablet TAKE 1 TABLET (40 MG TOTAL) BY MOUTH DAILY. 30 tablet 1  . Blood Glucose Monitoring Suppl (BAYER CONTOUR MONITOR) w/Device KIT Use with strips to check fasting sugar once daily. E 11.9 1 kit 0  . Blood Glucose Monitoring Suppl (ONE TOUCH ULTRA SYSTEM KIT) W/DEVICE KIT 1 kit by Does not apply route once. Use to check blood sugar four times daily [Patient not checking daily]    . buPROPion (WELLBUTRIN XL) 300 MG 24 hr tablet TAKE 1 TABLET BY MOUTH DAILY. 30 tablet 5  . fenofibrate (TRICOR) 145 MG tablet TAKE 1 TABLET (145 MG TOTAL) BY MOUTH DAILY. 30 tablet 5  . glucose blood (ACCU-CHEK AVIVA) test strip Use to check blood sugar 8 times per day. 300 each 2  . glucose blood (ACCU-CHEK COMPACT STRIPS) test strip Use as instructed to check blood sugar eight times a day. 300 each 2  . Insulin Glargine (BASAGLAR KWIKPEN) 100 UNIT/ML SOPN Inject 1 mL (100 Units total)  into the skin every morning. 90 mL 2  . Insulin Pen Needle (RELION PEN NEEDLE 31G/8MM) 31G X 8 MM MISC Use as Directed daily to check blood sugars 100 each 3  . Insulin Syringe-Needle U-100 30G 1 ML MISC USE AS DIRECTED TO INJECT INSULIN DX: 250.00 100 each 3  . meloxicam (MOBIC) 15 MG tablet Take 1 tablet (15 mg total) by mouth daily. 10 tablet 0  . metFORMIN (GLUCOPHAGE) 1000 MG tablet TAKE 1 TABLET BY MOUTH TWICE DAILY WITH A MEAL 60 tablet 3  . ONE TOUCH LANCETS MISC Use as directed 200 each 11  .  sertraline (ZOLOFT) 100 MG tablet TAKE 1 TABLET BY MOUTH DAILY 30 tablet 2   Current Facility-Administered Medications on File Prior to Visit  Medication Dose Route Frequency Provider Last Rate Last Dose  . 0.9 %  sodium chloride infusion  500 mL Intravenous Continuous Jerene Bears, MD        No Known Allergies  Family History  Problem Relation Age of Onset  . Diabetes Mother   . Thyroid disease Mother     Questionable  . Heart disease Father     deceased  . Other Neg Hx     hypogonadism  . Colon cancer Neg Hx     Social History   Social History  . Marital status: Married    Spouse name: N/A  . Number of children: N/A  . Years of education: N/A   Social History Main Topics  . Smoking status: Never Smoker  . Smokeless tobacco: Never Used  . Alcohol use 4.2 - 8.4 oz/week    7 - 14 Glasses of wine per week  . Drug use: No  . Sexual activity: Not Asked   Other Topics Concern  . None   Social History Narrative   Occupation: Engineer, site   Married -39 marriage (2nd marriage)   daughter 59,  2 sons (2nd marriage  11,6)   [place]   Never Smoked    Alcohol use-yes   Drug use-no    Regular exercise-no   Smoking Status:  never   Does Patient Exercise:  no   Caffeine use/day:  3-4 cups coffee daily   Drug Use:  no         Review of Systems - See HPI.  All other ROS are negative.  BP 140/82   Pulse 88   Temp 97.6 F (36.4 C) (Oral)   Resp 16   Ht 6' (1.829 m)   Wt 233 lb (105.7 kg)   SpO2 98%   BMI 31.60 kg/m   Physical Exam  Constitutional: He is oriented to person, place, and time and well-developed, well-nourished, and in no distress.  HENT:  Head: Normocephalic and atraumatic.  Right Ear: Tympanic membrane and external ear normal.  Left Ear: Tympanic membrane and external ear normal.  Nose: No mucosal edema or rhinorrhea. No epistaxis.  Mouth/Throat: Uvula is midline, oropharynx is clear and moist and mucous membranes are normal.  Eyes:  Conjunctivae are normal.  Neck: Neck supple.  Cardiovascular: Normal rate, regular rhythm, normal heart sounds and intact distal pulses.   Pulmonary/Chest: Effort normal and breath sounds normal. No respiratory distress. He has no wheezes. He has no rales. He exhibits no tenderness.  Lymphadenopathy:    He has no cervical adenopathy.  Neurological: He is alert and oriented to person, place, and time.  Skin: Skin is warm and dry.  Psychiatric: Affect normal.  Vitals reviewed.  Recent Results (from the past 2160 hour(s))  Hemoglobin A1c     Status: Abnormal   Collection Time: 01/23/16  1:39 PM  Result Value Ref Range   Hgb A1c MFr Bld 8.9 (H) 4.6 - 6.5 %    Comment: Glycemic Control Guidelines for People with Diabetes:Non Diabetic:  <6%Goal of Therapy: <7%Additional Action Suggested:  >8%   Comp Met (CMET)     Status: Abnormal   Collection Time: 01/23/16  1:39 PM  Result Value Ref Range   Sodium 138 135 - 145 mEq/L   Potassium 4.6 3.5 - 5.1 mEq/L   Chloride 100 96 - 112 mEq/L   CO2 30 19 - 32 mEq/L   Glucose, Bld 171 (H) 70 - 99 mg/dL   BUN 18 6 - 23 mg/dL   Creatinine, Ser 0.90 0.40 - 1.50 mg/dL   Total Bilirubin 0.3 0.2 - 1.2 mg/dL   Alkaline Phosphatase 79 39 - 117 U/L   AST 34 0 - 37 U/L   ALT 50 0 - 53 U/L   Total Protein 6.7 6.0 - 8.3 g/dL   Albumin 4.5 3.5 - 5.2 g/dL   Calcium 9.7 8.4 - 10.5 mg/dL   GFR 92.24 >60.00 mL/min    Assessment/Plan: Essential hypertension Above goal today. Will switch ramipril to lisinopril-hctz. DASH diet reviewed. FU scheduled.  Epistaxis Secondary to nasal dryness. Supportive measures and home treatments reviewed. FU if not improving.   Diabetes mellitus Referral back to endo for uncontrolled diabetes. Unsure why patient would decrease units by 20 when instructed to increase based on fasting sugars. Will start titration schedule as directed -- 2 units every 3-4 days. Goal fasting sugars discussed. Continue metformin as  directed.  Class 1 obesity with serious comorbidity and body mass index (BMI) of 31.0 to 31.9 in adult Diet and exercise reviewed. Referral to Bariatric surgery placed at patient request.     Leeanne Rio, PA-C

## 2016-03-01 ENCOUNTER — Encounter: Payer: Self-pay | Admitting: Physician Assistant

## 2016-03-03 MED ORDER — AMOXICILLIN 500 MG PO CAPS: ORAL_CAPSULE | ORAL | 0 refills | Status: DC

## 2016-03-04 DIAGNOSIS — E669 Obesity, unspecified: Secondary | ICD-10-CM | POA: Insufficient documentation

## 2016-03-04 DIAGNOSIS — R040 Epistaxis: Secondary | ICD-10-CM | POA: Insufficient documentation

## 2016-03-04 DIAGNOSIS — K080 Exfoliation of teeth due to systemic causes: Secondary | ICD-10-CM | POA: Diagnosis not present

## 2016-03-04 DIAGNOSIS — K011 Impacted teeth: Secondary | ICD-10-CM | POA: Diagnosis not present

## 2016-03-04 DIAGNOSIS — Z6831 Body mass index (BMI) 31.0-31.9, adult: Secondary | ICD-10-CM

## 2016-03-04 MED FILL — LORazepam 2 MG TABS: 2 | 1 days supply | Qty: 1 | Fill #0

## 2016-03-04 MED FILL — ONDANSETRON ODT 4 MG TABLET: 4 | 2 days supply | Qty: 6 | Fill #0

## 2016-03-04 MED FILL — AMOXICILLIN 250 MG CAPSULE: 250 | 5 days supply | Qty: 15 | Fill #0

## 2016-03-04 MED FILL — AMOXICILLIN 500 MG CAPSULE: 500 | 1 days supply | Qty: 4 | Fill #0

## 2016-03-04 MED FILL — HYDROCODON-APAP 5-325: 5-325 | 4 days supply | Qty: 16 | Fill #0

## 2016-03-04 NOTE — Assessment & Plan Note (Signed)
Above goal today. Will switch ramipril to lisinopril-hctz. DASH diet reviewed. FU scheduled.

## 2016-03-04 NOTE — Assessment & Plan Note (Signed)
Referral back to endo for uncontrolled diabetes. Unsure why patient would decrease units by 20 when instructed to increase based on fasting sugars. Will start titration schedule as directed -- 2 units every 3-4 days. Goal fasting sugars discussed. Continue metformin as directed.

## 2016-03-04 NOTE — Assessment & Plan Note (Signed)
Secondary to nasal dryness. Supportive measures and home treatments reviewed. FU if not improving.

## 2016-03-04 NOTE — Assessment & Plan Note (Signed)
Diet and exercise reviewed. Referral to Bariatric surgery placed at patient request.

## 2016-03-05 ENCOUNTER — Telehealth: Payer: Self-pay | Admitting: Physician Assistant

## 2016-03-05 DIAGNOSIS — E119 Type 2 diabetes mellitus without complications: Secondary | ICD-10-CM | POA: Diagnosis not present

## 2016-03-05 NOTE — Telephone Encounter (Signed)
Contact patient to make him aware that his BMI does not meet guidelines for bariatric surgery. He went into telling me about his fall on the ice. Hip is doing better, but now feels like when he breathes something is pressing against his lung and ribs. Please advise

## 2016-03-05 NOTE — Telephone Encounter (Signed)
He would need reassessment in office. He had x-rays negative for definitive fracture but that was right after fall. Could consider CT chest to further assess. Would recommend assessment first to avoid radiation if possible.

## 2016-03-06 ENCOUNTER — Other Ambulatory Visit: Payer: Self-pay | Admitting: Physician Assistant

## 2016-03-06 MED ORDER — HYDROCODONE-ACETAMINOPHEN 10-325 MG PO TABS: 1.0000 | ORAL_TABLET | Freq: Four times a day (QID) | ORAL | 0 refills | Status: DC | PRN

## 2016-03-07 ENCOUNTER — Other Ambulatory Visit (HOSPITAL_COMMUNITY): Payer: Self-pay | Admitting: Surgery

## 2016-03-07 MED FILL — HYDROCODON-APAP 10-325: 10-325 | 5 days supply | Qty: 20 | Fill #0

## 2016-03-08 ENCOUNTER — Other Ambulatory Visit: Payer: Self-pay | Admitting: Family Medicine

## 2016-03-08 MED FILL — BUPROPION HCL XL 300 MG TAB: 300 | 30 days supply | Qty: 30 | Fill #4

## 2016-03-08 MED FILL — BASAGLAR 100 UNIT/ML KWIKPE: 100 | 90 days supply | Qty: 90 | Fill #1

## 2016-03-08 NOTE — Telephone Encounter (Signed)
My Chart message sent

## 2016-03-11 ENCOUNTER — Other Ambulatory Visit: Payer: Self-pay | Admitting: Emergency Medicine

## 2016-03-11 ENCOUNTER — Encounter: Payer: Self-pay | Admitting: Physician Assistant

## 2016-03-11 DIAGNOSIS — M9901 Segmental and somatic dysfunction of cervical region: Secondary | ICD-10-CM | POA: Diagnosis not present

## 2016-03-11 DIAGNOSIS — M542 Cervicalgia: Secondary | ICD-10-CM | POA: Diagnosis not present

## 2016-03-11 DIAGNOSIS — M62830 Muscle spasm of back: Secondary | ICD-10-CM | POA: Diagnosis not present

## 2016-03-11 DIAGNOSIS — M25551 Pain in right hip: Secondary | ICD-10-CM | POA: Diagnosis not present

## 2016-03-11 MED ORDER — SERTRALINE HCL 100 MG PO TABS: 100.0000 mg | ORAL_TABLET | Freq: Every day | ORAL | 3 refills | Status: DC

## 2016-03-11 MED ORDER — ALPRAZOLAM 1 MG PO TABS: ORAL_TABLET | ORAL | 0 refills | Status: DC

## 2016-03-11 MED FILL — SERTRALINE HCL 100 MG TAB: 100 | 30 days supply | Qty: 30 | Fill #0

## 2016-03-11 MED FILL — ALPRAZolam 1 MG TABS: 1 | 30 days supply | Qty: 60 | Fill #0

## 2016-03-11 NOTE — Telephone Encounter (Signed)
Xanax last filled 02/08/16 #60 0RF Sertraline last filled 12/06/15 #30 2 RF Please advise of Xanax refill

## 2016-03-11 NOTE — Telephone Encounter (Signed)
Ok to refill. Please check last UDS. If due we will collect at FU this week.

## 2016-03-12 DIAGNOSIS — M25551 Pain in right hip: Secondary | ICD-10-CM | POA: Diagnosis not present

## 2016-03-15 ENCOUNTER — Ambulatory Visit (INDEPENDENT_AMBULATORY_CARE_PROVIDER_SITE_OTHER): Payer: Federal, State, Local not specified - PPO | Admitting: Physician Assistant

## 2016-03-15 ENCOUNTER — Encounter: Payer: Self-pay | Admitting: Physician Assistant

## 2016-03-15 ENCOUNTER — Telehealth: Payer: Self-pay | Admitting: Physician Assistant

## 2016-03-15 VITALS — BP 126/72 | HR 89 | Temp 98.1°F | Resp 14 | Ht 72.0 in | Wt 232.0 lb

## 2016-03-15 DIAGNOSIS — S20219D Contusion of unspecified front wall of thorax, subsequent encounter: Secondary | ICD-10-CM | POA: Diagnosis not present

## 2016-03-15 MED ORDER — MELOXICAM 15 MG PO TABS: 15.0000 mg | ORAL_TABLET | Freq: Every day | ORAL | 0 refills | Status: DC

## 2016-03-15 MED ORDER — ONDANSETRON 4 MG PO TBDP
4.0000 mg | ORAL_TABLET | Freq: Three times a day (TID) | ORAL | 0 refills | Status: DC | PRN
Start: 2016-03-15 — End: 2017-03-14

## 2016-03-15 MED ORDER — ONDANSETRON 4 MG PO TBDP: 4.0000 mg | ORAL_TABLET | ORAL | 0 refills | Status: DC | PRN

## 2016-03-15 MED FILL — ONDANSETRON ODT 4 MG TABLET: 4 | 7 days supply | Qty: 20 | Fill #0

## 2016-03-15 MED FILL — MELOXICAM 15 MG TABLET: 15 | 10 days supply | Qty: 10 | Fill #0

## 2016-03-15 NOTE — Patient Instructions (Signed)
Please take the Mobic daily as directed over the next 1.5 weeks. Take with food. Zofran as directed as needed -- feel nausea is secondary to mild constipation from narcotic pain medication.  I encourage you to increase hydration and the amount of fiber in your diet.  Start a daily probiotic (Align, Culturelle, Digestive Advantage, etc.). If no bowel movement within 24 hours, take 2 Tbs of Milk of Magnesia in a 4 oz glass of warmed prune juice every 2-3 days to help promote bowel movement. If no results within 24 hours, then repeat above regimen, adding a Dulcolax stool softener to regimen. If this does not promote a bowel movement, please call the office.  Avoid heavy lifting or overexertion. If the hematoma is not improving over the next week, please call so we can set you up for an Ultrasound.  Blood pressure looks good. Continue current regimen.  Follow-up in 3 months for hypertension.

## 2016-03-15 NOTE — Progress Notes (Signed)
Patient presents to clinic today c/o continued anterior rib pain and left lateral rib pain s/p fall on 02/24/2016. Went to ER on same day. Imaging at that time negative for fractures. Pain of anterior ribs is with breathing and sharp, resolves when relaxed. Denies shortness of breath or true chest pain. Lateral rib pain is noted as a soreness that is exacerbated when laying down. Has been taking Hydrocodone prn for severe pain. Tylenol for milder pain. Some nausea with pain medication. Patient notes that his pain is markedly improved from onset and improved daily. Patient has seen his Orthopedist, Dr. Creig Hines for this issue as well who told him pain is from contusion and will take several weeks to resolve.  Past Medical History:  Diagnosis Date  . Adjustment disorder with mixed anxiety and depressed mood   . Arthritis   . Asthma    history  . Diabetes mellitus    type 2  . Elevated blood pressure    history of high blood pressure readings  . History of chicken pox   . Hypercholesteremia   . Hypertension   . Internal hemorrhoids   . Tubular adenoma of colon     Current Outpatient Prescriptions on File Prior to Visit  Medication Sig Dispense Refill  . ALPRAZolam (XANAX) 1 MG tablet TAKE 1 TO 2 TABLETS BY MOUTH AT BEDTIME AS NEEDED FOR SLEEP OR ANXIETY 60 tablet 0  . atorvastatin (LIPITOR) 40 MG tablet TAKE 1 TABLET (40 MG TOTAL) BY MOUTH DAILY. 30 tablet 1  . Blood Glucose Monitoring Suppl (BAYER CONTOUR MONITOR) w/Device KIT Use with strips to check fasting sugar once daily. E 11.9 1 kit 0  . Blood Glucose Monitoring Suppl (ONE TOUCH ULTRA SYSTEM KIT) W/DEVICE KIT 1 kit by Does not apply route once. Use to check blood sugar four times daily [Patient not checking daily]    . buPROPion (WELLBUTRIN XL) 300 MG 24 hr tablet TAKE 1 TABLET BY MOUTH DAILY. 30 tablet 5  . fenofibrate (TRICOR) 145 MG tablet TAKE 1 TABLET (145 MG TOTAL) BY MOUTH DAILY. 30 tablet 5  . glucose blood (ACCU-CHEK  AVIVA) test strip Use to check blood sugar 8 times per day. 300 each 2  . glucose blood (ACCU-CHEK COMPACT STRIPS) test strip Use as instructed to check blood sugar eight times a day. 300 each 2  . HYDROcodone-acetaminophen (NORCO) 10-325 MG tablet Take 1 tablet by mouth every 6 (six) hours as needed. 20 tablet 0  . Insulin Glargine (BASAGLAR KWIKPEN) 100 UNIT/ML SOPN Inject 1 mL (100 Units total) into the skin every morning. 90 mL 2  . Insulin Pen Needle (RELION PEN NEEDLE 31G/8MM) 31G X 8 MM MISC Use as Directed daily to check blood sugars 100 each 3  . Insulin Syringe-Needle U-100 30G 1 ML MISC USE AS DIRECTED TO INJECT INSULIN DX: 250.00 100 each 3  . lisinopril-hydrochlorothiazide (PRINZIDE,ZESTORETIC) 10-12.5 MG tablet Take 1 tablet by mouth daily. 30 tablet 1  . meloxicam (MOBIC) 15 MG tablet Take 1 tablet (15 mg total) by mouth daily. 10 tablet 0  . metFORMIN (GLUCOPHAGE) 1000 MG tablet TAKE 1 TABLET BY MOUTH TWICE DAILY WITH A MEAL 60 tablet 3  . ONE TOUCH LANCETS MISC Use as directed 200 each 11  . sertraline (ZOLOFT) 100 MG tablet Take 1 tablet (100 mg total) by mouth daily. 30 tablet 3   Current Facility-Administered Medications on File Prior to Visit  Medication Dose Route Frequency Provider Last Rate Last Dose  .  0.9 %  sodium chloride infusion  500 mL Intravenous Continuous Jerene Bears, MD        No Known Allergies  Family History  Problem Relation Age of Onset  . Diabetes Mother   . Thyroid disease Mother     Questionable  . Heart disease Father     deceased  . Other Neg Hx     hypogonadism  . Colon cancer Neg Hx     Social History   Social History  . Marital status: Married    Spouse name: N/A  . Number of children: N/A  . Years of education: N/A   Social History Main Topics  . Smoking status: Never Smoker  . Smokeless tobacco: Never Used  . Alcohol use 4.2 - 8.4 oz/week    7 - 14 Glasses of wine per week  . Drug use: No  . Sexual activity: Not Asked    Other Topics Concern  . None   Social History Narrative   Occupation: Engineer, site   Married -31 marriage (2nd marriage)   daughter 63,  2 sons (2nd marriage  11,6)   [place]   Never Smoked    Alcohol use-yes   Drug use-no    Regular exercise-no   Smoking Status:  never   Does Patient Exercise:  no   Caffeine use/day:  3-4 cups coffee daily   Drug Use:  no         Review of Systems - See HPI.  All other ROS are negative.  BP 126/72   Pulse 89   Temp 98.1 F (36.7 C) (Oral)   Resp 14   Ht 6' (1.829 m)   Wt 232 lb (105.2 kg)   SpO2 97%   BMI 31.46 kg/m   Physical Exam  Constitutional: He is oriented to person, place, and time and well-developed, well-nourished, and in no distress.  HENT:  Head: Normocephalic and atraumatic.  Eyes: Conjunctivae are normal.  Neck: Neck supple.  Cardiovascular: Normal rate, regular rhythm, normal heart sounds and intact distal pulses.   Pulmonary/Chest: Effort normal and breath sounds normal. No respiratory distress. He has no wheezes. He exhibits no tenderness.  Abdominal: Soft. Bowel sounds are normal. He exhibits no distension. There is no tenderness.  Musculoskeletal:       Right hip: He exhibits normal range of motion and normal strength.  Neurological: He is alert and oriented to person, place, and time. No cranial nerve deficit.  Skin: Skin is warm and dry. No rash noted.  Psychiatric: Affect normal.  Vitals reviewed.   Recent Results (from the past 2160 hour(s))  Hemoglobin A1c     Status: Abnormal   Collection Time: 01/23/16  1:39 PM  Result Value Ref Range   Hgb A1c MFr Bld 8.9 (H) 4.6 - 6.5 %    Comment: Glycemic Control Guidelines for People with Diabetes:Non Diabetic:  <6%Goal of Therapy: <7%Additional Action Suggested:  >8%   Comp Met (CMET)     Status: Abnormal   Collection Time: 01/23/16  1:39 PM  Result Value Ref Range   Sodium 138 135 - 145 mEq/L   Potassium 4.6 3.5 - 5.1 mEq/L   Chloride 100 96 - 112  mEq/L   CO2 30 19 - 32 mEq/L   Glucose, Bld 171 (H) 70 - 99 mg/dL   BUN 18 6 - 23 mg/dL   Creatinine, Ser 0.90 0.40 - 1.50 mg/dL   Total Bilirubin 0.3 0.2 - 1.2 mg/dL  Alkaline Phosphatase 79 39 - 117 U/L   AST 34 0 - 37 U/L   ALT 50 0 - 53 U/L   Total Protein 6.7 6.0 - 8.3 g/dL   Albumin 4.5 3.5 - 5.2 g/dL   Calcium 9.7 8.4 - 10.5 mg/dL   GFR 92.24 >60.00 mL/min   Assessment/Plan: 1. Contusion of rib, unspecified laterality, subsequent encounter Pain still present but improving. No severe tenderness noted on examination. Some mild discomfort with palpation between the ribs but no major bony tenderness present. Discussed repeat imaging versus continued monitoring as patient's symptoms are improving. He declines imaging at present. Rx Mobic. Supportive measures reviewed. FU if not continuing to resolve.   Leeanne Rio, PA-C

## 2016-03-15 NOTE — Telephone Encounter (Signed)
Spoke with Elyn Aquas, he is resubmitting the RX to the pharmacy.  Tech is aware.

## 2016-03-15 NOTE — Progress Notes (Signed)
Pre visit review using our clinic review tool, if applicable. No additional management support is needed unless otherwise documented below in the visit note. 

## 2016-03-15 NOTE — Telephone Encounter (Signed)
Pharmacy Tech needs to clarify medication instructions.  Thanks.  -LL

## 2016-03-19 DIAGNOSIS — F509 Eating disorder, unspecified: Secondary | ICD-10-CM | POA: Diagnosis not present

## 2016-03-20 ENCOUNTER — Encounter: Payer: Federal, State, Local not specified - PPO | Admitting: Internal Medicine

## 2016-03-21 ENCOUNTER — Other Ambulatory Visit: Payer: Self-pay | Admitting: Emergency Medicine

## 2016-03-21 NOTE — Telephone Encounter (Signed)
Indication for Anxiety: Xanax Medication and dose: 1 mg # pills per month: #60 0RF on 03/11/16 Last UDS date: 01/04/16-moderate risk Pain contract signed (Y/N): Yes 01/03/16 Date narcotic database last reviewed (include red flags):

## 2016-03-26 ENCOUNTER — Ambulatory Visit (HOSPITAL_COMMUNITY)
Admission: RE | Admit: 2016-03-26 | Discharge: 2016-03-26 | Disposition: A | Discharge status: Home / Self Care | Payer: Federal, State, Local not specified - PPO | Source: Ambulatory Visit | Attending: Surgery | Admitting: Surgery

## 2016-03-26 ENCOUNTER — Other Ambulatory Visit: Payer: Self-pay

## 2016-03-26 DIAGNOSIS — Z01810 Encounter for preprocedural cardiovascular examination: Secondary | ICD-10-CM | POA: Diagnosis not present

## 2016-03-26 DIAGNOSIS — S299XXA Unspecified injury of thorax, initial encounter: Secondary | ICD-10-CM | POA: Diagnosis not present

## 2016-03-26 DIAGNOSIS — Z9884 Bariatric surgery status: Secondary | ICD-10-CM | POA: Diagnosis not present

## 2016-03-26 DIAGNOSIS — Z01818 Encounter for other preprocedural examination: Secondary | ICD-10-CM | POA: Diagnosis not present

## 2016-03-26 MED FILL — ONDANSETRON ODT 4 MG TABLET: 4 | 2 days supply | Qty: 10 | Fill #0

## 2016-03-27 ENCOUNTER — Encounter: Payer: Federal, State, Local not specified - PPO | Attending: Surgery | Admitting: Skilled Nursing Facility1

## 2016-03-27 ENCOUNTER — Encounter: Payer: Self-pay | Admitting: Skilled Nursing Facility1

## 2016-03-27 ENCOUNTER — Ambulatory Visit: Payer: Federal, State, Local not specified - PPO | Admitting: Skilled Nursing Facility1

## 2016-03-27 DIAGNOSIS — E119 Type 2 diabetes mellitus without complications: Secondary | ICD-10-CM | POA: Insufficient documentation

## 2016-03-27 DIAGNOSIS — Z6831 Body mass index (BMI) 31.0-31.9, adult: Secondary | ICD-10-CM | POA: Insufficient documentation

## 2016-03-27 DIAGNOSIS — Z713 Dietary counseling and surveillance: Secondary | ICD-10-CM | POA: Insufficient documentation

## 2016-03-27 DIAGNOSIS — Z794 Long term (current) use of insulin: Secondary | ICD-10-CM

## 2016-03-27 NOTE — Patient Instructions (Signed)
Follow Pre-Op Goals Try Protein Shakes Call [facility] at [phone] when surgery is scheduled to enroll in Pre-Op Class  Things to remember:  Please always be honest with [place]. We want to support you!  If you have any questions or concerns in between appointments, please call or email   The diet after surgery will be high protein and low in carbohydrate.  Vitamins and calcium need to be taken for the rest of your life.  Feel free to include support people in any classes or appointments.   Supplement recommendations:  Before Surgery   1 Complete Multivitamin with Iron  3000 IU Vitamin D3  After Surgery   2 Chewable Multivitamins  **Best Choice - Bariatric Advantage Advanced Multi EA      3 Chewable Calcium (500 mg each, total 1200-1500 mg per day)  **Best Choice - Celebrate, Bariatric Advantage, or Wellesse  Other Options:    2 Flinstones Complete + up to 100 mg Thiamin + 2000-3000 IU Vitamin D3 + 350-500 mcg Vitamin B12 + 30-45 mg Iron (with history of deficiency)  2 Celebrate MultiComplete with 18 mg Iron (this provides 6000 IU of  Vitamin D3)  4 Celebrate Essential Multi 2 in 1 (has calcium) + 18-60 mg separate  iron  Vitamins and Calcium are available at:   [facility]   [place], [place], [place] [address]   [email or web address]  [email or web address]  www.[email or web address]

## 2016-03-27 NOTE — Progress Notes (Signed)
  Pre-Op Assessment Visit:  Pre-Operative Rou-En-Y BypassSurgery  Medical Nutrition Therapy:  Appt start time: 9:24  End time:  10:55  Patient was seen on 03/27/2016 for Pre-Operative Nutrition Assessment. Assessment and letter of approval faxed to [facility] Surgery Bariatric Surgery Program coordinator on 03/27/2016.   Pt states he has already lost 10 pounds since January 19th by chewing his food more thoroughly.  Pt states he Gave up coffee 6 months ago, chewing and not drinking with meals.Pt states he does not eat meat but he does eat eggs and cheese and drinks milk, drinks milk at night for cravings, pt states he wants to be 190 pounds.  Pt states his psychologist has opened up his mind to properly chewing. A1C 8.9, not checking his blood sugar due to diabetes burnout. Pt states his Mother was an emotional wreck dieting all the time and making him eat all of his food off his plate. Pt described his tumultuous childhood in between his parents and how that plays into his eating habits.  During the appointment the pt described Cramping feeling in chest, tight feeling in his chest and cheeks so he took an aspirin: occurs occasionally, pt reports immediate relief with the aspirin. Pt has never received diabetes education but was not open to this education during the appointment.  Premier protein lot: [id number]  Exp: 21/dec/2018 Start weight at [facility]: 221.6 pounds BMI: 30.17 this BMI is not typically covered by insurance: this fact was discussed with the pt to which he replied he thinks he can convince them otherwise, Dietitian educated the pt on the BMI plus co morbidities is 35   24 hr Dietary Recall: First Meal: bagel with cream cheese Snack:  Second Meal: salad or sandwich----subway Snack:  Third Meal:  Snack:  Beverages:tea, diluted juice, milk, zero calorie drink   Encouraged to engage in 150 minutes of moderate physical activity including cardiovascular and weight baring  weekly  Handouts given during visit include:  . Pre-Op Goals . Bariatric Surgery Protein Shakes During the appointment today the following Pre-Op Goals were reviewed with the patient: . Maintain or lose weight as instructed by your surgeon . Make healthy food choices . Begin to limit portion sizes . Limited concentrated sugars and fried foods . Keep fat/sugar in the single digits per serving on         food labels . Practice CHEWING your food  (aim for 30 chews per bite or until applesauce consistency) . Practice not drinking 15 minutes before, during, and 30 minutes after each meal/snack . Avoid all carbonated beverages  . Avoid/limit caffeinated beverages  . Avoid all sugar-sweetened beverages . Consume 3 meals per day; eat every 3-5 hours . Make a list of non-food related activities . Aim for 64-100 ounces of FLUID daily  . Aim for at least 60-80 grams of PROTEIN daily . Look for a liquid protein source that contain ?15 g protein and ?5 g carbohydrate  (ex: shakes, drinks, shots)  -Follow diet recommendations listed below   Energy and Macronutrient Recomendations: Calories: 1800 Carbohydrate: 200 Protein: 135 Fat: 50  Demonstrated degree of understanding via:  Teach Back  Teaching Method Utilized:  Visual Auditory Hands on  Barriers to learning/adherence to lifestyle change: lack of acceptance of proper education   Patient to call the Nutrition and Diabetes Education Services to enroll in Pre-Op and Post-Op Nutrition Education when surgery date is scheduled.

## 2016-03-28 ENCOUNTER — Ambulatory Visit: Payer: Federal, State, Local not specified - PPO | Admitting: Endocrinology

## 2016-03-28 DIAGNOSIS — F509 Eating disorder, unspecified: Secondary | ICD-10-CM | POA: Diagnosis not present

## 2016-03-29 DIAGNOSIS — K011 Impacted teeth: Secondary | ICD-10-CM | POA: Diagnosis not present

## 2016-04-02 ENCOUNTER — Other Ambulatory Visit (HOSPITAL_BASED_OUTPATIENT_CLINIC_OR_DEPARTMENT_OTHER): Payer: Self-pay

## 2016-04-02 DIAGNOSIS — R0683 Snoring: Secondary | ICD-10-CM

## 2016-04-05 ENCOUNTER — Other Ambulatory Visit: Payer: Self-pay | Admitting: Physician Assistant

## 2016-04-05 MED FILL — BUPROPION HCL XL 300 MG TAB: 300 | 30 days supply | Qty: 30 | Fill #5

## 2016-04-05 MED FILL — SERTRALINE HCL 100 MG TAB: 100 | 30 days supply | Qty: 30 | Fill #1

## 2016-04-05 MED FILL — FENOFIBRATE 145 MG TAB: 145 | 30 days supply | Qty: 30 | Fill #2

## 2016-04-05 MED FILL — LISINOPRIL-HCTZ 10-12.5 MG: 10-12.5 | 30 days supply | Qty: 30 | Fill #1

## 2016-04-05 MED FILL — metFORMIN HCL 1000 MG TABS: 1000 | 30 days supply | Qty: 60 | Fill #1

## 2016-04-05 MED FILL — ATORVASTATIN 40 MG TABLET: 40 | 30 days supply | Qty: 30 | Fill #1

## 2016-04-05 NOTE — Telephone Encounter (Signed)
Xanax last filled 03/11/16 #60 0RF UDS: 01/05/16 moderate CSC:01/03/16  Please advise

## 2016-04-08 DIAGNOSIS — M9901 Segmental and somatic dysfunction of cervical region: Secondary | ICD-10-CM | POA: Diagnosis not present

## 2016-04-08 DIAGNOSIS — M62830 Muscle spasm of back: Secondary | ICD-10-CM | POA: Diagnosis not present

## 2016-04-08 DIAGNOSIS — M542 Cervicalgia: Secondary | ICD-10-CM | POA: Diagnosis not present

## 2016-04-08 DIAGNOSIS — M25551 Pain in right hip: Secondary | ICD-10-CM | POA: Diagnosis not present

## 2016-04-08 MED FILL — ALPRAZolam 1 MG TABS: 1 | 30 days supply | Qty: 60 | Fill #0

## 2016-04-08 NOTE — Telephone Encounter (Signed)
Faxed rx to the [place]

## 2016-04-17 ENCOUNTER — Ambulatory Visit: Payer: Federal, State, Local not specified - PPO | Admitting: Skilled Nursing Facility1

## 2016-04-17 MED FILL — LIDOCAINE 5% PATCH: 5 | 15 days supply | Qty: 15 | Fill #0

## 2016-04-19 ENCOUNTER — Ambulatory Visit: Payer: Federal, State, Local not specified - PPO | Admitting: Endocrinology

## 2016-04-22 ENCOUNTER — Encounter: Payer: Federal, State, Local not specified - PPO | Admitting: Internal Medicine

## 2016-04-25 ENCOUNTER — Ambulatory Visit: Payer: Federal, State, Local not specified - PPO | Admitting: Skilled Nursing Facility1

## 2016-05-06 ENCOUNTER — Other Ambulatory Visit: Payer: Self-pay | Admitting: Physician Assistant

## 2016-05-06 MED FILL — SERTRALINE HCL 100 MG TAB: 100 | 30 days supply | Qty: 30 | Fill #2

## 2016-05-06 MED FILL — metFORMIN HCL 1000 MG TABS: 1000 | 30 days supply | Qty: 60 | Fill #2

## 2016-05-06 MED FILL — ALPRAZolam 1 MG TABS: 1 | 30 days supply | Qty: 60 | Fill #1

## 2016-05-07 MED FILL — BUPROPION HCL XL 300 MG TAB: 300 | 30 days supply | Qty: 30 | Fill #0

## 2016-05-07 MED FILL — LISINOPRIL-HCTZ 10-12.5 MG: 10-12.5 | 30 days supply | Qty: 30 | Fill #0

## 2016-05-10 ENCOUNTER — Encounter (HOSPITAL_BASED_OUTPATIENT_CLINIC_OR_DEPARTMENT_OTHER): Payer: Federal, State, Local not specified - PPO

## 2016-05-12 ENCOUNTER — Encounter: Payer: Self-pay | Admitting: Physician Assistant

## 2016-05-14 MED ORDER — SERTRALINE HCL 100 MG PO TABS: 100.0000 mg | ORAL_TABLET | Freq: Two times a day (BID) | ORAL | 1 refills | Status: DC

## 2016-05-16 ENCOUNTER — Encounter: Payer: Self-pay | Admitting: Skilled Nursing Facility1

## 2016-05-16 ENCOUNTER — Encounter: Payer: Federal, State, Local not specified - PPO | Attending: Surgery | Admitting: Skilled Nursing Facility1

## 2016-05-16 DIAGNOSIS — E119 Type 2 diabetes mellitus without complications: Secondary | ICD-10-CM

## 2016-05-16 DIAGNOSIS — Z6831 Body mass index (BMI) 31.0-31.9, adult: Secondary | ICD-10-CM | POA: Diagnosis not present

## 2016-05-16 DIAGNOSIS — Z713 Dietary counseling and surveillance: Secondary | ICD-10-CM | POA: Insufficient documentation

## 2016-05-16 DIAGNOSIS — Z794 Long term (current) use of insulin: Secondary | ICD-10-CM

## 2016-05-16 NOTE — Progress Notes (Signed)
  Pre-Op Assessment Visit:  Pre-Operative Rou-En-Y BypassSurgery  Medical Nutrition Therapy:  Appt start time: 9:24  End time:  10:55   Start weight at [facility]: 215.12 pounds BMI: 29.38  Pt states he is no longer interested in getting surgery. Pt states he is taking Magnesium citrate. Pt states he is standing at work due to a standing desk. Pt states he has been constipated. Pt is not interested in the education to be offered. Pt asked what he thought dietitian thought of him, when asked for clarification he laughed and states he really wants to know what the psychologist wrote about him.   24 hr Dietary Recall: First Meal: bagel with cream cheese with fruit Snack:  Second Meal: shake and milk Snack:  Third Meal: salad  Snack: crackers and milk Beverages:tea, diluted juice, milk, zero calorie drink   Encouraged to engage in 150 minutes of moderate physical activity including cardiovascular and weight baring weekly  Handouts given during visit include:  . Pre-Op Goals . Bariatric Surgery Protein Shakes During the appointment today the following Pre-Op Goals were reviewed with the patient: . Maintain or lose weight as instructed by your surgeon . Make healthy food choices . Begin to limit portion sizes . Limited concentrated sugars and fried foods . Keep fat/sugar in the single digits per serving on         food labels . Practice CHEWING your food  (aim for 30 chews per bite or until applesauce consistency) . Practice not drinking 15 minutes before, during, and 30 minutes after each meal/snack . Avoid all carbonated beverages  . Avoid/limit caffeinated beverages  . Avoid all sugar-sweetened beverages . Consume 3 meals per day; eat every 3-5 hours . Make a list of non-food related activities . Aim for 64-100 ounces of FLUID daily  . Aim for at least 60-80 grams of PROTEIN daily . Look for a liquid protein source that contain ?15 g protein and ?5 g carbohydrate  (ex: shakes, drinks,  shots)  -Follow diet recommendations listed below   Energy and Macronutrient Recomendations: Calories: 1800 Carbohydrate: 200 Protein: 135 Fat: 50  Demonstrated degree of understanding via:  Teach Back  Teaching Method Utilized:  Visual Auditory Hands on  Barriers to learning/adherence to lifestyle change: lack of acceptance of proper education   Patient to call the Nutrition and Diabetes Education Services to enroll in Pre-Op and Post-Op Nutrition Education when surgery date is scheduled.

## 2016-05-31 ENCOUNTER — Ambulatory Visit (HOSPITAL_BASED_OUTPATIENT_CLINIC_OR_DEPARTMENT_OTHER): Payer: Federal, State, Local not specified - PPO | Attending: Surgery | Admitting: Internal Medicine

## 2016-05-31 VITALS — Ht 72.0 in | Wt 215.0 lb

## 2016-05-31 DIAGNOSIS — R0683 Snoring: Secondary | ICD-10-CM | POA: Diagnosis not present

## 2016-05-31 DIAGNOSIS — G4733 Obstructive sleep apnea (adult) (pediatric): Secondary | ICD-10-CM | POA: Diagnosis not present

## 2016-06-03 ENCOUNTER — Other Ambulatory Visit: Payer: Self-pay | Admitting: Physician Assistant

## 2016-06-03 MED FILL — FENOFIBRATE 145 MG TAB: 145 | 30 days supply | Qty: 30 | Fill #3

## 2016-06-03 MED FILL — LISINOPRIL-HCTZ 10-12.5 MG: 10-12.5 | 30 days supply | Qty: 30 | Fill #1

## 2016-06-03 MED FILL — BUPROPION HCL XL 300 MG TAB: 300 | 30 days supply | Qty: 30 | Fill #1

## 2016-06-03 MED FILL — BASAGLAR 100 UNIT/ML KWIKPE: 100 | 90 days supply | Qty: 90 | Fill #2

## 2016-06-03 MED FILL — metFORMIN HCL 1000 MG TABS: 1000 | 30 days supply | Qty: 60 | Fill #3

## 2016-06-04 ENCOUNTER — Other Ambulatory Visit: Payer: Self-pay | Admitting: Physician Assistant

## 2016-06-04 MED FILL — SERTRALINE HCL 100 MG TAB: 100 | 30 days supply | Qty: 60 | Fill #0

## 2016-06-04 MED FILL — ULTRA-THIN II PEN NDL 29GX1: 29G X 12.7M | 90 days supply | Qty: 100 | Fill #0

## 2016-06-06 ENCOUNTER — Other Ambulatory Visit: Payer: Self-pay | Admitting: Emergency Medicine

## 2016-06-06 NOTE — Telephone Encounter (Signed)
Last filled 04/08/16 #60 1 RF No CSC UDS: 01/05/16  Please advise

## 2016-06-07 ENCOUNTER — Encounter: Payer: Self-pay | Admitting: Emergency Medicine

## 2016-06-07 ENCOUNTER — Encounter: Payer: Self-pay | Admitting: Physician Assistant

## 2016-06-07 MED ORDER — ALPRAZOLAM 1 MG PO TABS: ORAL_TABLET | ORAL | 1 refills | Status: DC

## 2016-06-07 MED FILL — ALPRAZolam 1 MG TABS: 1 | 30 days supply | Qty: 60 | Fill #0

## 2016-06-07 NOTE — Telephone Encounter (Signed)
Will allow one month supply. Needs follow-up before further refills of medication.  Rx printed and faxed.

## 2016-06-07 NOTE — Telephone Encounter (Signed)
Faxed rx to preferred pharmacy. [facility]. My chart message sent advising patient he is due for an follow up appointment. No more refills until appointment

## 2016-06-08 DIAGNOSIS — R0683 Snoring: Secondary | ICD-10-CM | POA: Diagnosis not present

## 2016-06-08 NOTE — Procedures (Signed)
  Patient Name: William [name], William [name] Date: 05/31/2016 Gender: Male D.O.B: 1960-03-27 Age (years): 13 Referring Provider: Johnathan Hausen Height (inches): 80 Interpreting Physician: Baird Lyons MD, ABSM Weight (lbs): 215 RPSGT: Baxter Flattery BMI: 31 MRN: [id number] Neck Size: 16.50 CLINICAL INFORMATION Sleep Study Type: NPSG  Indication for sleep study: Fatigue, Hypertension, Snoring, Witnessed Apneas  Epworth Sleepiness Score: 4  SLEEP STUDY TECHNIQUE As per the AASM Manual for the Scoring of Sleep and Associated Events v2.3 (April 2016) with a hypopnea requiring 4% desaturations.  The channels recorded and monitored were frontal, central and occipital EEG, electrooculogram (EOG), submentalis EMG (chin), nasal and oral airflow, thoracic and abdominal wall motion, anterior tibialis EMG, snore microphone, electrocardiogram, and pulse oximetry.  MEDICATIONS Medications self-administered by patient taken the night of the study : METFORMIN, ZOLOFT, WELLBUTRIN, LISINOPRIL, ALPRAZOLAM  SLEEP ARCHITECTURE The study was initiated at 10:51:05 PM and ended at 4:52:07 AM.  Sleep onset time was 32.0 minutes and the sleep efficiency was 60.9%. The total sleep time was 220.0 minutes.  Stage REM latency was N/A minutes.  The patient spent 20.68% of the night in stage N1 sleep, 79.32% in stage N2 sleep, 0.00% in stage N3 and 0.00% in REM.  Alpha intrusion was absent.  Supine sleep was 43.86%.  RESPIRATORY PARAMETERS The overall apnea/hypopnea index (AHI) was 0.3 per hour. There were 1 total apneas, including 1 obstructive, 0 central and 0 mixed apneas. There were 0 hypopneas and 0 RERAs.  The AHI during Stage REM sleep was N/A per hour.  AHI while supine was 0.6 per hour.  The mean oxygen saturation was 92.03%. The minimum SpO2 during sleep was 88.00%.  Soft snoring was noted during this study.  CARDIAC DATA The 2 lead EKG demonstrated sinus rhythm. The mean heart rate was 72.65  beats per minute. Other EKG findings include: None.  LEG MOVEMENT DATA The total PLMS were 0 with a resulting PLMS index of 0.00. Associated arousal with leg movement index was 0.0 .  IMPRESSIONS - No significant obstructive sleep apnea occurred during this study (AHI = 0.3/h). - No significant central sleep apnea occurred during this study (CAI = 0.0/h). - Mild oxygen desaturation was noted during this study (Min O2 = 88.00%). - The patient snored with Soft snoring volume. - No cardiac abnormalities were noted during this study. - Clinically significant periodic limb movements did not occur during sleep. No significant associated arousals. - Difficulty maintaining sleep, noting restroom x 4.  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 However patient may benefit from being treated for insomnia. - Sleep hygiene should be reviewed to assess factors that may improve sleep quality. - Weight management and regular exercise should be initiated or continued if appropriate.  [Electronically signed] 06/08/2016 02:50 PM  Baird Lyons MD, [place], American Board of Sleep Medicine   NPI: [id number]  [place], [place] of Sleep [facility]  ELECTRONICALLY SIGNED ON:  06/08/2016, 2:46 PM [place] PH: [phone]   FX: [phone] [place]

## 2016-06-10 ENCOUNTER — Encounter: Payer: Federal, State, Local not specified - PPO | Admitting: Internal Medicine

## 2016-06-21 ENCOUNTER — Encounter: Payer: Self-pay | Admitting: Physician Assistant

## 2016-06-26 ENCOUNTER — Encounter: Payer: Self-pay | Admitting: Physician Assistant

## 2016-06-27 NOTE — Progress Notes (Signed)
Patient presents to clinic today for annual exam.  Patient is fasting for labs. Body mass index is 29.57 kg/m.  Diet -- Endorses well-balanced overall. Is watching portion sizes. Exercise -- No current exercise at present.   Acute Concerns: Denies acute concerns at today's visit.   Chronic Issues: Anxiety/Depression -- Is currently on a regimen of Sertraline 200 mg daily and Wellbutrin 300 mg XL once daily. Endorses doing very well with this regimen. Is sleeping well at night. Endorses good appetite overall. Denies SI/HI.  Hyperlipidemia -- Is taking Atorvastatin 40 mg and fenofibrate 145 mg daily. Is taking as directed. Is keeping a well-balanced diet overall. Exercise is limited due to work but is going on regular walks. Has lost 15 pounds due to dietary changes.   Hypertension -- Is currently on a regimen of lisinopril-HCTZ. Is taking as directed. Patient denies chest pain, palpitations, lightheadedness, dizziness, vision changes or frequent headaches.  BP Readings from Last 3 Encounters:  06/28/16 110/70  03/15/16 126/72  02/29/16 140/82   Diabetes Mellitus II -- Uncontrolled. Currently on Basaglar 70 units daily. Is also taking Metformin 100 mg BID. Is not checking sugars as directed against medical advice. Patient is a history of self-titrating medications. Foot examination up-to-date. Last eye examination 10/2015 - No Retinopathy.   Health Maintenance: Immunizations -- due for TDaP today. Agrees to have.  Colonoscopy -- up-to-date.   Past Medical History:  Diagnosis Date  . Adjustment disorder with mixed anxiety and depressed mood   . Arthritis   . Asthma    history  . Diabetes mellitus    type 2  . Elevated blood pressure    history of high blood pressure readings  . History of chicken pox   . Hypercholesteremia   . Hypertension   . Internal hemorrhoids   . Tubular adenoma of colon     Past Surgical History:  Procedure Laterality Date  . ELBOW SURGERY     right elbow 2010, left elbow 08/06/10 [name])  . KNEE SURGERY     right knee new ACL- 1993,2000  . KNEE SURGERY     left knee 2003  . NASAL SEPTUM SURGERY     1992  . PARTIAL HIP ARTHROPLASTY     right hip replacement    Current Outpatient Prescriptions on File Prior to Visit  Medication Sig Dispense Refill  . ALPRAZolam (XANAX) 1 MG tablet TAKE 1 TO 2 TABLETS BY MOUTH AT BEDTIME AS NEEDED FOR SLEEP OR ANXIETY 60 tablet 1  . atorvastatin (LIPITOR) 40 MG tablet TAKE 1 TABLET (40 MG TOTAL) BY MOUTH DAILY. 30 tablet 0  . BD ULTRA-FINE PEN NEEDLES 29G X 12.7MM MISC USE AS DIRECTED TO INJECT LANTUS 100 each 3  . Blood Glucose Monitoring Suppl (BAYER CONTOUR MONITOR) w/Device KIT Use with strips to check fasting sugar once daily. E 11.9 1 kit 0  . Blood Glucose Monitoring Suppl (ONE TOUCH ULTRA SYSTEM KIT) W/DEVICE KIT 1 kit by Does not apply route once. Use to check blood sugar four times daily [Patient not checking daily]    . buPROPion (WELLBUTRIN XL) 300 MG 24 hr tablet TAKE 1 TABLET BY MOUTH DAILY. 30 tablet 5  . fenofibrate (TRICOR) 145 MG tablet TAKE 1 TABLET (145 MG TOTAL) BY MOUTH DAILY. 30 tablet 5  . glucose blood (ACCU-CHEK AVIVA) test strip Use to check blood sugar 8 times per day. 300 each 2  . glucose blood (ACCU-CHEK COMPACT STRIPS) test strip Use as instructed  to check blood sugar eight times a day. 300 each 2  . Insulin Glargine (BASAGLAR KWIKPEN) 100 UNIT/ML SOPN Inject 1 mL (100 Units total) into the skin every morning. 90 mL 2  . Insulin Pen Needle (RELION PEN NEEDLE 31G/8MM) 31G X 8 MM MISC Use as Directed daily to check blood sugars 100 each 3  . Insulin Syringe-Needle U-100 30G 1 ML MISC USE AS DIRECTED TO INJECT INSULIN DX: 250.00 100 each 3  . lisinopril-hydrochlorothiazide (PRINZIDE,ZESTORETIC) 10-12.5 MG tablet TAKE 1 TABLET BY MOUTH DAILY. 30 tablet 5  . metFORMIN (GLUCOPHAGE) 1000 MG tablet TAKE 1 TABLET BY MOUTH TWICE DAILY WITH A MEAL 60 tablet 3  .  ondansetron (ZOFRAN-ODT) 4 MG disintegrating tablet Take 1 tablet (4 mg total) by mouth every 8 (eight) hours as needed. 20 tablet 0  . ONE TOUCH LANCETS MISC Use as directed 200 each 11  . sertraline (ZOLOFT) 100 MG tablet Take 1 tablet (100 mg total) by mouth 2 (two) times daily. 60 tablet 1   Current Facility-Administered Medications on File Prior to Visit  Medication Dose Route Frequency Provider Last Rate Last Dose  . 0.9 %  sodium chloride infusion  500 mL Intravenous Continuous Pyrtle, Lajuan Lines, MD        No Known Allergies  Family History  Problem Relation Age of Onset  . Diabetes Mother   . Thyroid disease Mother        Questionable  . Heart disease Father        deceased  . Other Neg Hx        hypogonadism  . Colon cancer Neg Hx     Social History   Social History  . Marital status: Married    Spouse name: N/A  . Number of children: N/A  . Years of education: N/A   Occupational History  . Not on file.   Social History Main Topics  . Smoking status: Never Smoker  . Smokeless tobacco: Never Used  . Alcohol use 4.2 - 8.4 oz/week    7 - 14 Glasses of wine per week  . Drug use: No  . Sexual activity: Not on file   Other Topics Concern  . Not on file   Social History Narrative   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   Married -85 marriage (2nd marriage)   daughter 35,  2 sons (2nd marriage  11,6)   [place]   Never Smoked    Alcohol use-yes   Drug use-no    Regular exercise-no   Smoking Status:  never   Does Patient Exercise:  no   Caffeine use/day:  3-4 cups coffee daily   Drug Use:  no         Review of Systems  Constitutional: Negative for fever and weight loss.  HENT: Negative for ear discharge, ear pain, hearing loss and tinnitus.   Eyes: Negative for blurred vision, double vision, photophobia and pain.  Respiratory: Negative for cough and shortness of breath.   Cardiovascular: Negative for chest pain and palpitations.  Gastrointestinal: Negative for  abdominal pain, blood in stool, constipation, diarrhea, heartburn, melena, nausea and vomiting.  Genitourinary: Negative for dysuria, flank pain, frequency, hematuria and urgency.  Musculoskeletal: Negative for falls.  Neurological: Negative for dizziness, loss of consciousness and headaches.  Endo/Heme/Allergies: Negative for environmental allergies.  Psychiatric/Behavioral: Negative for depression, hallucinations, substance abuse and suicidal ideas. The patient is not nervous/anxious and does not have insomnia.    BP 110/70   Pulse 76  Temp 97.8 F (36.6 C) (Oral)   Resp 14   Ht 6' (1.829 m)   Wt 218 lb (98.9 kg)   SpO2 97%   BMI 29.57 kg/m   Physical Exam  Constitutional: He is oriented to person, place, and time and well-developed, well-nourished, and in no distress.  HENT:  Head: Normocephalic and atraumatic.  Right Ear: External ear normal.  Left Ear: External ear normal.  Nose: Nose normal.  Mouth/Throat: Oropharynx is clear and moist. No oropharyngeal exudate.  Eyes: Conjunctivae and EOM are normal. Pupils are equal, round, and reactive to light.  Neck: Neck supple. No thyromegaly present.  Cardiovascular: Normal rate, regular rhythm, normal heart sounds and intact distal pulses.   Pulmonary/Chest: Effort normal and breath sounds normal. No respiratory distress. He has no wheezes. He has no rales. He exhibits no tenderness.  Abdominal: Soft. Bowel sounds are normal. He exhibits no distension and no mass. There is no tenderness. There is no rebound and no guarding.  Genitourinary: Testes/scrotum normal.  Genitourinary Comments: Defers DRE.  Lymphadenopathy:    He has no cervical adenopathy.  Neurological: He is alert and oriented to person, place, and time.  Skin: Skin is warm and dry. No rash noted.  Psychiatric: Affect normal.  Vitals reviewed.  Assessment/Plan: Visit for preventive health examination Depression screen negative. Health Maintenance reviewed -- TDaP  updated today.  Preventive schedule discussed and handout given in AVS. Will obtain fasting labs today.   Hyperlipemia Is taking medications as directed. Fasting labs today to reassess. Dietary and exercise recommendations reviewed with patient.   Essential hypertension BP normotensive. Asymptomatic. Continue current regimen. Repeat labs today.  Diabetes mellitus Uncontrolled. Patient non-adherent to instructions. Foot exam and eye exam up-to-date. Is on ACEI. Will obtain repeat labs to further assess today and to make further adjustments.   ADJUSTMENT DISORDER WITH ANXIOUS MOOD Doing very well on current regimen. Continue the same.     Leeanne Rio, PA-C

## 2016-06-28 ENCOUNTER — Ambulatory Visit (INDEPENDENT_AMBULATORY_CARE_PROVIDER_SITE_OTHER): Payer: Federal, State, Local not specified - PPO | Admitting: Physician Assistant

## 2016-06-28 ENCOUNTER — Encounter: Payer: Self-pay | Admitting: Physician Assistant

## 2016-06-28 DIAGNOSIS — Z23 Encounter for immunization: Secondary | ICD-10-CM

## 2016-06-28 DIAGNOSIS — I10 Essential (primary) hypertension: Secondary | ICD-10-CM | POA: Diagnosis not present

## 2016-06-28 DIAGNOSIS — E785 Hyperlipidemia, unspecified: Secondary | ICD-10-CM | POA: Diagnosis not present

## 2016-06-28 DIAGNOSIS — E119 Type 2 diabetes mellitus without complications: Secondary | ICD-10-CM | POA: Diagnosis not present

## 2016-06-28 DIAGNOSIS — Z0000 Encounter for general adult medical examination without abnormal findings: Secondary | ICD-10-CM

## 2016-06-28 DIAGNOSIS — F4322 Adjustment disorder with anxiety: Secondary | ICD-10-CM

## 2016-06-28 DIAGNOSIS — Z794 Long term (current) use of insulin: Secondary | ICD-10-CM

## 2016-06-28 LAB — LIPID PANEL
Cholesterol: 130 mg/dL (ref 0–200)
HDL: 26.8 mg/dL — ABNORMAL LOW (ref >39.00)
LDL Cholesterol: 73 mg/dL (ref 0–99)
NonHDL: 103.55
TRIGLYCERIDES: 151 mg/dL — AB (ref 0.0–149.0)
Total CHOL/HDL Ratio: 5
VLDL: 30.2 mg/dL (ref 0.0–40.0)

## 2016-06-28 LAB — COMPREHENSIVE METABOLIC PANEL
ALT: 35 U/L (ref 0–53)
AST: 24 U/L (ref 0–37)
Albumin: 4.5 g/dL (ref 3.5–5.2)
Alkaline Phosphatase: 74 U/L (ref 39–117)
BILIRUBIN TOTAL: 0.3 mg/dL (ref 0.2–1.2)
BUN: 24 mg/dL — ABNORMAL HIGH (ref 6–23)
CO2: 30 meq/L (ref 19–32)
Calcium: 9.3 mg/dL (ref 8.4–10.5)
Chloride: 101 mEq/L (ref 96–112)
Creatinine, Ser: 0.91 mg/dL (ref 0.40–1.50)
GFR: 90.93 mL/min (ref >60.00)
GLUCOSE: 268 mg/dL — AB (ref 70–99)
Potassium: 4.5 mEq/L (ref 3.5–5.1)
SODIUM: 136 meq/L (ref 135–145)
Total Protein: 6.7 g/dL (ref 6.0–8.3)

## 2016-06-28 LAB — TSH: TSH: 3.91 u[IU]/mL (ref 0.35–4.50)

## 2016-06-28 LAB — URINALYSIS, ROUTINE W REFLEX MICROSCOPIC
BILIRUBIN URINE: NEGATIVE
Hgb urine dipstick: NEGATIVE
Ketones, ur: NEGATIVE
LEUKOCYTES UA: NEGATIVE
NITRITE: NEGATIVE
Specific Gravity, Urine: 1.02 (ref 1.000–1.030)
TOTAL PROTEIN, URINE-UPE24: NEGATIVE
UROBILINOGEN UA: 0.2 (ref 0.0–1.0)
Urine Glucose: >=1000 — AB
WBC, UA: NONE SEEN — AB (ref 0–?)
pH: 6 (ref 5.0–8.0)

## 2016-06-28 LAB — PSA: PSA: 0.35 ng/mL (ref 0.10–4.00)

## 2016-06-28 LAB — CBC
HCT: 37.9 % — ABNORMAL LOW (ref 39.0–52.0)
HEMOGLOBIN: 12.9 g/dL — AB (ref 13.0–17.0)
MCHC: 34 g/dL (ref 30.0–36.0)
MCV: 85.1 fl (ref 78.0–100.0)
Platelets: 196 10*3/uL (ref 150.0–400.0)
RBC: 4.46 Mil/uL (ref 4.22–5.81)
RDW: 14.1 % (ref 11.5–15.5)
WBC: 6.5 10*3/uL (ref 4.0–10.5)

## 2016-06-28 LAB — HEMOGLOBIN A1C: Hgb A1c MFr Bld: 8.8 % — ABNORMAL HIGH (ref 4.6–6.5)

## 2016-06-28 LAB — POCT CBG (FASTING - GLUCOSE)-MANUAL ENTRY: Glucose Fasting, POC: 234 mg/dL — AB (ref 70–99)

## 2016-06-28 NOTE — Assessment & Plan Note (Signed)
BP normotensive. Asymptomatic. Continue current regimen. Repeat labs today. 

## 2016-06-28 NOTE — Progress Notes (Signed)
Pre visit review using our clinic review tool, if applicable. No additional management support is needed unless otherwise documented below in the visit note. 

## 2016-06-28 NOTE — Patient Instructions (Signed)
Please go to the lab for blood work.   Our office will call you with your results unless you have chosen to receive results via MyChart.  If your blood work is normal we will follow-up each year for physicals and as scheduled for chronic medical problems.  If anything is abnormal we will treat accordingly and get you in for a follow-up.  We will alter regimen based on results. I will speak with the [facility] about the alprazolam.    Preventive Care 40-64 Years, Male Preventive care refers to lifestyle choices and visits with your health care provider that can promote health and wellness. What does preventive care include?  A yearly physical exam. This is also called an annual well check.  Dental exams once or twice a year.  Routine eye exams. Ask your health care provider how often you should have your eyes checked.  Personal lifestyle choices, including:  Daily care of your teeth and gums.  Regular physical activity.  Eating a healthy diet.  Avoiding tobacco and drug use.  Limiting alcohol use.  Practicing safe sex.  Taking low-dose aspirin every day starting at age 68. What happens during an annual well check? The services and screenings done by your health care provider during your annual well check will depend on your age, overall health, lifestyle risk factors, and family history of disease. Counseling  Your health care provider may ask you questions about your:  Alcohol use.  Tobacco use.  Drug use.  Emotional well-being.  Home and relationship well-being.  Sexual activity.  Eating habits.  Work and work Statistician. Screening  You may have the following tests or measurements:  Height, weight, and BMI.  Blood pressure.  Lipid and cholesterol levels. These may be checked every 5 years, or more frequently if you are over 17 years old.  Skin check.  Lung cancer screening. You may have this screening every year starting at age 3 if you  have a 30-pack-year history of smoking and currently smoke or have quit within the past 15 years.  Fecal occult blood test (FOBT) of the stool. You may have this test every year starting at age 50.  Flexible sigmoidoscopy or colonoscopy. You may have a sigmoidoscopy every 5 years or a colonoscopy every 10 years starting at age 24.  Prostate cancer screening. Recommendations will vary depending on your family history and other risks.  Hepatitis C blood test.  Hepatitis B blood test.  Sexually transmitted disease (STD) testing.  Diabetes screening. This is done by checking your blood sugar (glucose) after you have not eaten for a while (fasting). You may have this done every 1-3 years. Discuss your test results, treatment options, and if necessary, the need for more tests with your health care provider. Vaccines  Your health care provider may recommend certain vaccines, such as:  Influenza vaccine. This is recommended every year.  Tetanus, diphtheria, and acellular pertussis (Tdap, Td) vaccine. You may need a Td booster every 10 years.  Varicella vaccine. You may need this if you have not been vaccinated.  Zoster vaccine. You may need this after age 30.  Measles, mumps, and rubella (MMR) vaccine. You may need at least one dose of MMR if you were born in 1957 or later. You may also need a second dose.  Pneumococcal 13-valent conjugate (PCV13) vaccine. You may need this if you have certain conditions and have not been vaccinated.  Pneumococcal polysaccharide (PPSV23) vaccine. You may need one or two doses if  you smoke cigarettes or if you have certain conditions.  Meningococcal vaccine. You may need this if you have certain conditions.  Hepatitis A vaccine. You may need this if you have certain conditions or if you travel or work in places where you may be exposed to hepatitis A.  Hepatitis B vaccine. You may need this if you have certain conditions or if you travel or work in places  where you may be exposed to hepatitis B.  Haemophilus influenzae type b (Hib) vaccine. You may need this if you have certain risk factors. Talk to your health care provider about which screenings and vaccines you need and how often you need them. This information is not intended to replace advice given to you by your health care provider. Make sure you discuss any questions you have with your health care provider. Document Released: 02/17/2015 Document Revised: 10/11/2015 Document Reviewed: 11/22/2014 Elsevier Interactive Patient Education  2017 [employer].

## 2016-06-28 NOTE — Assessment & Plan Note (Signed)
Uncontrolled. Patient non-adherent to instructions. Foot exam and eye exam up-to-date. Is on ACEI. Will obtain repeat labs to further assess today and to make further adjustments.

## 2016-06-28 NOTE — Assessment & Plan Note (Signed)
Doing very well on current regimen. Continue the same.

## 2016-06-28 NOTE — Assessment & Plan Note (Signed)
Is taking medications as directed. Fasting labs today to reassess. Dietary and exercise recommendations reviewed with patient.

## 2016-06-28 NOTE — Assessment & Plan Note (Signed)
Depression screen negative. Health Maintenance reviewed -- TDaP updated today. Preventive schedule discussed and handout given in AVS. Will obtain fasting labs today.  

## 2016-07-03 ENCOUNTER — Ambulatory Visit (INDEPENDENT_AMBULATORY_CARE_PROVIDER_SITE_OTHER): Payer: Federal, State, Local not specified - PPO | Admitting: Physician Assistant

## 2016-07-03 ENCOUNTER — Encounter: Payer: Self-pay | Admitting: Physician Assistant

## 2016-07-03 ENCOUNTER — Ambulatory Visit: Payer: Federal, State, Local not specified - PPO | Admitting: Physician Assistant

## 2016-07-03 VITALS — BP 118/72 | HR 89 | Temp 97.6°F | Resp 14 | Ht 72.0 in | Wt 218.0 lb

## 2016-07-03 DIAGNOSIS — H6121 Impacted cerumen, right ear: Secondary | ICD-10-CM | POA: Diagnosis not present

## 2016-07-03 DIAGNOSIS — H7292 Unspecified perforation of tympanic membrane, left ear: Secondary | ICD-10-CM

## 2016-07-03 MED ORDER — ALPRAZOLAM 1 MG PO TABS: ORAL_TABLET | ORAL | 1 refills | Status: DC

## 2016-07-03 MED FILL — ALPRAZolam 1 MG TABS: 1 | 30 days supply | Qty: 60 | Fill #0

## 2016-07-03 NOTE — Patient Instructions (Signed)
Please do not put anything else in the ear. Symptoms should gradually improve over the next 4-5 days. If not we will need ENT to assess for a larger tear.  Again examination impaired today due to the wax/blood but we cannot flush due to concern for perforation.

## 2016-07-03 NOTE — Progress Notes (Signed)
Pre visit review using our clinic review tool, if applicable. No additional management support is needed unless otherwise documented below in the visit note. 

## 2016-07-03 NOTE — Progress Notes (Signed)
Patient presents to clinic today c/o ear wax buildup of ears bilaterally. States he would like them cleaned out. Notes trying to clean L ear with qtip. Noted bleeding and decreased hearing while using q-tip. Mild pain that has resolved. Denies drainage from ear.   Past Medical History:  Diagnosis Date  . Adjustment disorder with mixed anxiety and depressed mood   . Arthritis   . Asthma    history  . Diabetes mellitus    type 2  . Elevated blood pressure    history of high blood pressure readings  . History of chicken pox   . Hypercholesteremia   . Hypertension   . Internal hemorrhoids   . Tubular adenoma of colon     Current Outpatient Prescriptions on File Prior to Visit  Medication Sig Dispense Refill  . ALPRAZolam (XANAX) 1 MG tablet TAKE 1 TO 2 TABLETS BY MOUTH AT BEDTIME AS NEEDED FOR SLEEP OR ANXIETY 60 tablet 1  . atorvastatin (LIPITOR) 40 MG tablet TAKE 1 TABLET (40 MG TOTAL) BY MOUTH DAILY. 30 tablet 0  . BD ULTRA-FINE PEN NEEDLES 29G X 12.7MM MISC USE AS DIRECTED TO INJECT LANTUS 100 each 3  . Blood Glucose Monitoring Suppl (BAYER CONTOUR MONITOR) w/Device KIT Use with strips to check fasting sugar once daily. E 11.9 1 kit 0  . Blood Glucose Monitoring Suppl (ONE TOUCH ULTRA SYSTEM KIT) W/DEVICE KIT 1 kit by Does not apply route once. Use to check blood sugar four times daily [Patient not checking daily]    . buPROPion (WELLBUTRIN XL) 300 MG 24 hr tablet TAKE 1 TABLET BY MOUTH DAILY. 30 tablet 5  . fenofibrate (TRICOR) 145 MG tablet TAKE 1 TABLET (145 MG TOTAL) BY MOUTH DAILY. 30 tablet 5  . glucose blood (ACCU-CHEK AVIVA) test strip Use to check blood sugar 8 times per day. 300 each 2  . glucose blood (ACCU-CHEK COMPACT STRIPS) test strip Use as instructed to check blood sugar eight times a day. 300 each 2  . Insulin Glargine (BASAGLAR KWIKPEN) 100 UNIT/ML SOPN Inject 1 mL (100 Units total) into the skin every morning. 90 mL 2  . Insulin Pen Needle (RELION PEN NEEDLE  31G/8MM) 31G X 8 MM MISC Use as Directed daily to check blood sugars 100 each 3  . Insulin Syringe-Needle U-100 30G 1 ML MISC USE AS DIRECTED TO INJECT INSULIN DX: 250.00 100 each 3  . lisinopril-hydrochlorothiazide (PRINZIDE,ZESTORETIC) 10-12.5 MG tablet TAKE 1 TABLET BY MOUTH DAILY. 30 tablet 5  . metFORMIN (GLUCOPHAGE) 1000 MG tablet TAKE 1 TABLET BY MOUTH TWICE DAILY WITH A MEAL 60 tablet 3  . ondansetron (ZOFRAN-ODT) 4 MG disintegrating tablet Take 1 tablet (4 mg total) by mouth every 8 (eight) hours as needed. 20 tablet 0  . ONE TOUCH LANCETS MISC Use as directed 200 each 11  . sertraline (ZOLOFT) 100 MG tablet Take 1 tablet (100 mg total) by mouth 2 (two) times daily. 60 tablet 1   Current Facility-Administered Medications on File Prior to Visit  Medication Dose Route Frequency Provider Last Rate Last Dose  . 0.9 %  sodium chloride infusion  500 mL Intravenous Continuous Pyrtle, Lajuan Lines, MD        No Known Allergies  Family History  Problem Relation Age of Onset  . Diabetes Mother   . Thyroid disease Mother        Questionable  . Heart disease Father        deceased  . Other  Neg Hx        hypogonadism  . Colon cancer Neg Hx     Social History   Social History  . Marital status: Married    Spouse name: N/A  . Number of children: N/A  . Years of education: N/A   Social History Main Topics  . Smoking status: Never Smoker  . Smokeless tobacco: Never Used  . Alcohol use 4.2 - 8.4 oz/week    7 - 14 Glasses of wine per week  . Drug use: No  . Sexual activity: Not Asked   Other Topics Concern  . None   Social History Narrative   Occupation: Engineer, site   Married -57 marriage (2nd marriage)   daughter 54,  2 sons (2nd marriage  11,6)   [place]   Never Smoked    Alcohol use-yes   Drug use-no    Regular exercise-no   Smoking Status:  never   Does Patient Exercise:  no   Caffeine use/day:  3-4 cups coffee daily   Drug Use:  no         Review of Systems - See  HPI.  All other ROS are negative.  BP 118/72   Pulse 89   Temp 97.6 F (36.4 C) (Oral)   Resp 14   Ht 6' (1.829 m)   Wt 218 lb (98.9 kg)   SpO2 96%   BMI 29.57 kg/m   Physical Exam  Constitutional: He is oriented to person, place, and time and well-developed, well-nourished, and in no distress.  HENT:  Head: Normocephalic and atraumatic.  Nose: Nose normal.  Mouth/Throat: Uvula is midline, oropharynx is clear and moist and mucous membranes are normal.  Impaction of R ear noted.  L ear drum with bleeding noted, mild -- stemming from around anterior TM. There is slight wax in this area preventing full visualization of TM. Concern for small tear.  Neck: Neck supple.  Cardiovascular: Normal rate, regular rhythm, normal heart sounds and intact distal pulses.   Pulmonary/Chest: Effort normal and breath sounds normal.  Neurological: He is alert and oriented to person, place, and time.  Skin: Skin is warm and dry. No rash noted.  Vitals reviewed.   Recent Results (from the past 2160 hour(s))  CBC     Status: Abnormal   Collection Time: 06/28/16 11:24 AM  Result Value Ref Range   WBC 6.5 4.0 - 10.5 K/uL   RBC 4.46 4.22 - 5.81 Mil/uL   Platelets 196.0 150.0 - 400.0 K/uL   Hemoglobin 12.9 (L) 13.0 - 17.0 g/dL   HCT 37.9 (L) 39.0 - 52.0 %   MCV 85.1 78.0 - 100.0 fl   MCHC 34.0 30.0 - 36.0 g/dL   RDW 14.1 11.5 - 15.5 %  Comprehensive metabolic panel     Status: Abnormal   Collection Time: 06/28/16 11:24 AM  Result Value Ref Range   Sodium 136 135 - 145 mEq/L   Potassium 4.5 3.5 - 5.1 mEq/L   Chloride 101 96 - 112 mEq/L   CO2 30 19 - 32 mEq/L   Glucose, Bld 268 (H) 70 - 99 mg/dL   BUN 24 (H) 6 - 23 mg/dL   Creatinine, Ser 0.91 0.40 - 1.50 mg/dL   Total Bilirubin 0.3 0.2 - 1.2 mg/dL   Alkaline Phosphatase 74 39 - 117 U/L   AST 24 0 - 37 U/L   ALT 35 0 - 53 U/L   Total Protein 6.7 6.0 - 8.3 g/dL  Albumin 4.5 3.5 - 5.2 g/dL   Calcium 9.3 8.4 - 10.5 mg/dL   GFR 90.93 >60.00  mL/min  Hemoglobin A1c     Status: Abnormal   Collection Time: 06/28/16 11:24 AM  Result Value Ref Range   Hgb A1c MFr Bld 8.8 (H) 4.6 - 6.5 %    Comment: Glycemic Control Guidelines for People with Diabetes:Non Diabetic:  <6%Goal of Therapy: <7%Additional Action Suggested:  >8%   Lipid panel     Status: Abnormal   Collection Time: 06/28/16 11:24 AM  Result Value Ref Range   Cholesterol 130 0 - 200 mg/dL    Comment: ATP III Classification       Desirable:  < 200 mg/dL               Borderline High:  200 - 239 mg/dL          High:  > = 240 mg/dL   Triglycerides 151.0 (H) 0.0 - 149.0 mg/dL    Comment: Normal:  <150 mg/dLBorderline High:  150 - 199 mg/dL   HDL 26.80 (L) >39.00 mg/dL   VLDL 30.2 0.0 - 40.0 mg/dL   LDL Cholesterol 73 0 - 99 mg/dL   Total CHOL/HDL Ratio 5     Comment:                Men          Women1/2 Average Risk     3.4          3.3Average Risk          5.0          4.42X Average Risk          9.6          7.13X Average Risk          15.0          11.0                       NonHDL 103.55     Comment: NOTE:  Non-HDL goal should be 30 mg/dL higher than patient's LDL goal (i.e. LDL goal of < 70 mg/dL, would have non-HDL goal of < 100 mg/dL)  PSA     Status: None   Collection Time: 06/28/16 11:24 AM  Result Value Ref Range   PSA 0.35 0.10 - 4.00 ng/mL  TSH     Status: None   Collection Time: 06/28/16 11:24 AM  Result Value Ref Range   TSH 3.91 0.35 - 4.50 uIU/mL  Urinalysis, Routine w reflex microscopic     Status: Abnormal   Collection Time: 06/28/16 11:24 AM  Result Value Ref Range   Color, Urine YELLOW Yellow;Lt. Yellow   APPearance CLEAR Clear   Specific Gravity, Urine 1.020 1.000 - 1.030   pH 6.0 5.0 - 8.0   Total Protein, Urine NEGATIVE Negative   Urine Glucose >=1000 (A) Negative   Ketones, ur NEGATIVE Negative   Bilirubin Urine NEGATIVE Negative   Hgb urine dipstick NEGATIVE Negative   Urobilinogen, UA 0.2 0.0 - 1.0   Leukocytes, UA NEGATIVE Negative    Nitrite NEGATIVE Negative   WBC, UA No formed elements seen on urine microscopic exam. (A) 0-2/hpf  POCT CBG (Fasting - Glucose)     Status: Abnormal   Collection Time: 06/28/16 11:43 AM  Result Value Ref Range   Glucose Fasting, POC 234 (A) 70 - 99 mg/dL    Assessment/Plan: 1. Impacted cerumen of  right ear Removed via irrigation without difficulty. Home measures discussed.  2. Ear drum perforation, left Suspected due to trauma and decreased hearing. Patient adamant about flushing -- discussed contraindications. Recommended ENT referral. He wishes to wait and see if hearing improves. If not he will call back for ENT assessment. Supportive measures discussed. No qtips or other objects are to be inserted in the ear canal.    Leeanne Rio, PA-C

## 2016-07-04 ENCOUNTER — Encounter: Payer: Self-pay | Admitting: Emergency Medicine

## 2016-07-04 ENCOUNTER — Other Ambulatory Visit: Payer: Self-pay | Admitting: Physician Assistant

## 2016-07-04 MED FILL — SERTRALINE HCL 100 MG TAB: 100 | 30 days supply | Qty: 60 | Fill #1

## 2016-07-04 MED FILL — LISINOPRIL-HCTZ 10-12.5 MG: 10-12.5 | 30 days supply | Qty: 30 | Fill #2

## 2016-07-04 MED FILL — ATORVASTATIN 40 MG TABLET: 40 | 30 days supply | Qty: 30 | Fill #0

## 2016-07-04 MED FILL — BUPROPION HCL XL 300 MG TAB: 300 | 30 days supply | Qty: 30 | Fill #2

## 2016-07-04 MED FILL — FENOFIBRATE 145 MG TAB: 145 | 30 days supply | Qty: 30 | Fill #4

## 2016-07-04 MED FILL — metFORMIN HCL 1000 MG TABS: 1000 | 30 days supply | Qty: 60 | Fill #0

## 2016-07-21 ENCOUNTER — Encounter: Payer: Self-pay | Admitting: Physician Assistant

## 2016-08-01 ENCOUNTER — Other Ambulatory Visit: Payer: Self-pay | Admitting: Physician Assistant

## 2016-08-01 DIAGNOSIS — M5413 Radiculopathy, cervicothoracic region: Secondary | ICD-10-CM | POA: Diagnosis not present

## 2016-08-01 DIAGNOSIS — M9901 Segmental and somatic dysfunction of cervical region: Secondary | ICD-10-CM | POA: Diagnosis not present

## 2016-08-01 DIAGNOSIS — M9903 Segmental and somatic dysfunction of lumbar region: Secondary | ICD-10-CM | POA: Diagnosis not present

## 2016-08-01 DIAGNOSIS — M545 Low back pain: Secondary | ICD-10-CM | POA: Diagnosis not present

## 2016-08-01 MED FILL — FENOFIBRATE 145 MG TAB: 145 | 30 days supply | Qty: 30 | Fill #5

## 2016-08-01 MED FILL — BUPROPION HCL XL 300 MG TAB: 300 | 30 days supply | Qty: 30 | Fill #3

## 2016-08-01 MED FILL — ATORVASTATIN 40 MG TABLET: 40 | 90 days supply | Qty: 90 | Fill #0

## 2016-08-01 MED FILL — SERTRALINE HCL 100 MG TAB: 100 | 90 days supply | Qty: 180 | Fill #0

## 2016-08-01 MED FILL — LISINOPRIL-HCTZ 10-12.5 MG: 10-12.5 | 30 days supply | Qty: 30 | Fill #3

## 2016-08-01 MED FILL — ALPRAZolam 1 MG TABS: 1 | 30 days supply | Qty: 60 | Fill #1

## 2016-08-08 ENCOUNTER — Encounter: Payer: Self-pay | Admitting: Physician Assistant

## 2016-08-08 DIAGNOSIS — L02419 Cutaneous abscess of limb, unspecified: Secondary | ICD-10-CM

## 2016-08-14 DIAGNOSIS — L98429 Non-pressure chronic ulcer of back with unspecified severity: Secondary | ICD-10-CM | POA: Diagnosis not present

## 2016-08-23 ENCOUNTER — Encounter: Payer: Self-pay | Admitting: Physician Assistant

## 2016-09-03 ENCOUNTER — Other Ambulatory Visit: Payer: Self-pay | Admitting: Physician Assistant

## 2016-09-03 MED FILL — ALPRAZolam 1 MG TABS: 1 | 30 days supply | Qty: 60 | Fill #1

## 2016-09-03 MED FILL — metFORMIN HCL 1000 MG TABS: 1000 | 30 days supply | Qty: 60 | Fill #1

## 2016-09-03 MED FILL — BUPROPION HCL XL 300 MG TAB: 300 | 30 days supply | Qty: 30 | Fill #4

## 2016-09-03 MED FILL — LISINOPRIL-HCTZ 10-12.5 MG: 10-12.5 | 30 days supply | Qty: 30 | Fill #4

## 2016-09-04 MED FILL — BASAGLAR 100 UNIT/ML KWIKPE: 100 | 90 days supply | Qty: 90 | Fill #0

## 2016-09-24 ENCOUNTER — Encounter: Payer: Self-pay | Admitting: Physician Assistant

## 2016-10-04 ENCOUNTER — Other Ambulatory Visit: Payer: Self-pay | Admitting: General Practice

## 2016-10-04 ENCOUNTER — Encounter: Payer: Self-pay | Admitting: Physician Assistant

## 2016-10-04 ENCOUNTER — Other Ambulatory Visit: Payer: Self-pay | Admitting: Physician Assistant

## 2016-10-04 MED FILL — LISINOPRIL-HCTZ 10-12.5 MG: 10-12.5 | 30 days supply | Qty: 30 | Fill #5

## 2016-10-04 MED FILL — metFORMIN HCL 1000 MG TABS: 1000 | 30 days supply | Qty: 60 | Fill #2

## 2016-10-04 MED FILL — buPROPion HCL ER (XL) 300 M: 300 | 30 days supply | Qty: 30 | Fill #5

## 2016-10-08 MED ORDER — GLUCOSE BLOOD VI STRP: ORAL_STRIP | 12 refills | Status: DC

## 2016-10-08 NOTE — Addendum Note (Signed)
Addended by: Raiford Noble C on: 10/08/2016 01:06 PM   Modules accepted: Orders

## 2016-10-09 ENCOUNTER — Telehealth: Payer: Self-pay | Admitting: Emergency Medicine

## 2016-10-09 MED ORDER — ALPRAZOLAM 1 MG PO TABS: ORAL_TABLET | ORAL | 1 refills | Status: DC

## 2016-10-09 MED ORDER — GLUCOSE BLOOD VI STRP: ORAL_STRIP | 12 refills | Status: DC

## 2016-10-09 MED FILL — FENOFIBRATE 145 MG TAB: 145 | 30 days supply | Qty: 30 | Fill #0

## 2016-10-09 MED FILL — ALPRAZolam 1 MG TABS: 1 | 30 days supply | Qty: 60 | Fill #0

## 2016-10-09 NOTE — Addendum Note (Signed)
Addended by: Brunetta Jeans on: 10/09/2016 12:45 PM   Modules accepted: Orders

## 2016-10-09 NOTE — Telephone Encounter (Signed)
Ok to fax in refill that I have printed. I am messaging him to let him know he needs follow-up.

## 2016-10-09 NOTE — Telephone Encounter (Signed)
Medications were re-sent to the correct [facility]

## 2016-10-09 NOTE — Telephone Encounter (Signed)
Last rx Xanax 07/03/16 #60 1 RF CSC:01/03/16 UDS: 01/04/16 moderate risk Last OV: 07/03/16

## 2016-10-09 NOTE — Telephone Encounter (Signed)
This Rx went to wrong pharmacy, pt states that he does not use [facility] (I removed this from pt chart) pt states that he only uses [facility] in [place]. Pt also states that he needs a refill on test strips as well and states that he needs this today.

## 2016-10-14 DIAGNOSIS — M9901 Segmental and somatic dysfunction of cervical region: Secondary | ICD-10-CM | POA: Diagnosis not present

## 2016-10-14 DIAGNOSIS — M9903 Segmental and somatic dysfunction of lumbar region: Secondary | ICD-10-CM | POA: Diagnosis not present

## 2016-10-14 DIAGNOSIS — M5413 Radiculopathy, cervicothoracic region: Secondary | ICD-10-CM | POA: Diagnosis not present

## 2016-10-14 DIAGNOSIS — M545 Low back pain: Secondary | ICD-10-CM | POA: Diagnosis not present

## 2016-11-05 ENCOUNTER — Other Ambulatory Visit: Payer: Self-pay | Admitting: Physician Assistant

## 2016-11-05 MED FILL — FENOFIBRATE 145 MG TABLET: 145 | 30 days supply | Qty: 30 | Fill #1

## 2016-11-05 MED FILL — ATORVASTATIN 40 MG TABLET: 40 | 90 days supply | Qty: 90 | Fill #1

## 2016-11-05 MED FILL — SERTRALINE HCL 100 MG TAB: 100 | 90 days supply | Qty: 180 | Fill #1

## 2016-11-06 MED FILL — LISINOPRIL-HCTZ 10-12.5 MG: 10-12.5 | 30 days supply | Qty: 30 | Fill #0

## 2016-11-06 MED FILL — ALPRAZolam 1 MG TABS: 1 | 30 days supply | Qty: 60 | Fill #1

## 2016-11-06 MED FILL — BUPROPION HCL XL 300 MG TAB: 300 | 30 days supply | Qty: 30 | Fill #0

## 2016-11-07 ENCOUNTER — Telehealth: Payer: Self-pay | Admitting: Physician Assistant

## 2016-11-07 NOTE — Telephone Encounter (Signed)
Pt came in after hours to get his flu shot. Pt would like to know if he could have flu shot tomorrow. I did advise pt of nurse schedule. Pt says that he would like to have before the weekend if possible.    Please advise

## 2016-11-08 ENCOUNTER — Ambulatory Visit (INDEPENDENT_AMBULATORY_CARE_PROVIDER_SITE_OTHER): Payer: Federal, State, Local not specified - PPO

## 2016-11-08 DIAGNOSIS — Z23 Encounter for immunization: Secondary | ICD-10-CM | POA: Diagnosis not present

## 2016-11-08 NOTE — Telephone Encounter (Signed)
Pt has been scheduled.  °

## 2016-12-05 ENCOUNTER — Other Ambulatory Visit: Payer: Self-pay | Admitting: Physician Assistant

## 2016-12-05 MED FILL — BASAGLAR 100 UNIT/ML KWIKPE: 100 | 90 days supply | Qty: 90 | Fill #1

## 2016-12-05 MED FILL — metFORMIN HCL 1000 MG TABS: 1000 | 30 days supply | Qty: 60 | Fill #3

## 2016-12-05 MED FILL — FENOFIBRATE 145 MG TABLET: 145 | 30 days supply | Qty: 30 | Fill #2

## 2016-12-06 MED FILL — BUPROPION HCL XL 300 MG TAB: 300 | 30 days supply | Qty: 30 | Fill #0

## 2016-12-06 MED FILL — ALPRAZolam 1 MG TABS: 1 | 15 days supply | Qty: 30 | Fill #0

## 2016-12-06 MED FILL — LISINOPRIL-HCTZ 10-12.5 MG: 10-12.5 | 30 days supply | Qty: 30 | Fill #0

## 2016-12-06 NOTE — Telephone Encounter (Signed)
Please advise of refill request

## 2016-12-07 ENCOUNTER — Encounter: Payer: Self-pay | Admitting: Physician Assistant

## 2016-12-09 ENCOUNTER — Encounter: Payer: Self-pay | Admitting: Physician Assistant

## 2016-12-09 ENCOUNTER — Ambulatory Visit: Payer: Federal, State, Local not specified - PPO | Admitting: Physician Assistant

## 2016-12-09 VITALS — BP 122/76 | HR 79 | Temp 98.1°F | Resp 14 | Ht 72.0 in | Wt 227.0 lb

## 2016-12-09 DIAGNOSIS — N529 Male erectile dysfunction, unspecified: Secondary | ICD-10-CM

## 2016-12-09 DIAGNOSIS — F419 Anxiety disorder, unspecified: Secondary | ICD-10-CM

## 2016-12-09 DIAGNOSIS — F329 Major depressive disorder, single episode, unspecified: Secondary | ICD-10-CM

## 2016-12-09 DIAGNOSIS — E119 Type 2 diabetes mellitus without complications: Secondary | ICD-10-CM | POA: Diagnosis not present

## 2016-12-09 DIAGNOSIS — I10 Essential (primary) hypertension: Secondary | ICD-10-CM

## 2016-12-09 DIAGNOSIS — Z794 Long term (current) use of insulin: Secondary | ICD-10-CM | POA: Diagnosis not present

## 2016-12-09 DIAGNOSIS — F32A Depression, unspecified: Secondary | ICD-10-CM

## 2016-12-09 DIAGNOSIS — E291 Testicular hypofunction: Secondary | ICD-10-CM

## 2016-12-09 DIAGNOSIS — E785 Hyperlipidemia, unspecified: Secondary | ICD-10-CM

## 2016-12-09 MED ORDER — SILDENAFIL CITRATE 100 MG PO TABS: 50.0000 mg | ORAL_TABLET | Freq: Every day | ORAL | 0 refills | Status: DC | PRN

## 2016-12-09 NOTE — Patient Instructions (Signed)
Please stay well hydrated and get plenty of rest. Continue medications as directed. I will call you with your lab results.   I have sent the Viagra in.  Please schedule a morning lab appointment to check your testosterone.

## 2016-12-09 NOTE — Progress Notes (Signed)
Patient presents to clinic today for follow-up of chronic medical issues.  Hypertension: Patient is currently on a regimen of lisinopril-HCTZ 10-12.5 mg. Is taking daily as directed. Patient denies chest pain, palpitations, lightheadedness, dizziness, vision changes or frequent headaches.  BP Readings from Last 3 Encounters:  12/09/16 122/76  07/03/16 118/72  06/28/16 110/70   Diabetes Mellitus II: Is currently on a regimen of Metformin 1000 mg BID and Basaglar. Is taking 85 units each morning. Is rarely checking sugars but states they are looking good. Cannot give specific numbers. Endorses eye exam up-to-date. Is followed by [facility] exam. Is overdue for foot exam. Denies concerns today. Patient without history of nephropathy or retinopathy.  Hyperlipidemia: On Atorvastatin 40 mg daily. Is taking as directed. Is trying to watch diet.   Erectile Dysfunction: Multifactorial -- related to DM, HLD and Hypogonadism. Is doing well on viagra. Would like to continue current regimen.   Hypogonadism: Not currently on treatment. Patient wishing to discuss reassessment of testosterone levels.   Anxiety/Depression: Patient is currently on a regimen of Wellbutrin XL 300 mg and Sertraline 100 mg BID. Notes doing extremely well on current regimen. Denies breakthrough symptoms. Denies SI/HI.[name]   Past Medical History:  Diagnosis Date  . Adjustment disorder with mixed anxiety and depressed mood   . Arthritis   . Asthma    history  . Diabetes mellitus    type 2  . Elevated blood pressure    history of high blood pressure readings  . History of chicken pox   . Hypercholesteremia   . Hypertension   . Internal hemorrhoids   . Tubular adenoma of colon     Current Outpatient Medications on File Prior to Visit  Medication Sig Dispense Refill  . atorvastatin (LIPITOR) 40 MG tablet TAKE 1 TABLET BY MOUTH DAILY 30 tablet 0  . BD ULTRA-FINE PEN NEEDLES 29G X 12.7MM MISC USE AS DIRECTED TO  INJECT LANTUS 100 each 3  . Blood Glucose Monitoring Suppl (ONE TOUCH ULTRA SYSTEM KIT) W/DEVICE KIT 1 kit by Does not apply route once. Use to check blood sugar four times daily [Patient not checking daily]    . buPROPion (WELLBUTRIN XL) 300 MG 24 hr tablet TAKE 1 TABLET BY MOUTH DAILY. 30 tablet 0  . fenofibrate (TRICOR) 145 MG tablet TAKE 1 TABLET (145 MG TOTAL) BY MOUTH DAILY. 30 tablet 5  . glucose blood test strip Use as instructed 200 each 12  . Insulin Glargine (BASAGLAR KWIKPEN) 100 UNIT/ML SOPN INJECT 1 ML (100 UNITS TOTAL) INTO THE SKIN EVERY MORNING. 90 mL 1  . lisinopril-hydrochlorothiazide (PRINZIDE,ZESTORETIC) 10-12.5 MG tablet TAKE 1 TABLET BY MOUTH DAILY. *NEED APPOINTMENT* 30 tablet 0  . metFORMIN (GLUCOPHAGE) 1000 MG tablet TAKE 1 TABLET BY MOUTH TWICE DAILY WITH A MEAL 60 tablet 3  . ondansetron (ZOFRAN-ODT) 4 MG disintegrating tablet Take 1 tablet (4 mg total) by mouth every 8 (eight) hours as needed. 20 tablet 0  . ONE TOUCH LANCETS MISC Use as directed 200 each 11  . sertraline (ZOLOFT) 100 MG tablet TAKE 1 TABLET (100 MG TOTAL) BY MOUTH 2 (TWO) TIMES DAILY. 180 tablet 1  . sildenafil (VIAGRA) 100 MG tablet Take 0.5-1 tablets (50-100 mg total) daily as needed by mouth for erectile dysfunction. 30 tablet 0   No current facility-administered medications on file prior to visit.     No Known Allergies  Family History  Problem Relation Age of Onset  . Diabetes Mother   . Thyroid  disease Mother        Questionable  . Heart disease Father        deceased  . Other Neg Hx        hypogonadism  . Colon cancer Neg Hx     Social History   Socioeconomic History  . Marital status: Married    Spouse name: None  . Number of children: None  . Years of education: None  . Highest education level: None  Social Needs  . Financial resource strain: None  . Food insecurity - worry: None  . Food insecurity - inability: None  . Transportation needs - medical: None  .  Transportation needs - non-medical: None  Occupational History  . None  Tobacco Use  . Smoking status: Never Smoker  . Smokeless tobacco: Never Used  Substance and Sexual Activity  . Alcohol use: Yes    Alcohol/week: 4.2 - 8.4 oz    Types: 7 - 14 Glasses of wine per week  . Drug use: No  . Sexual activity: None  Other Topics Concern  . 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  All other ROS are negative.  BP 122/76   Pulse 79   Temp 98.1 F (36.7 C) (Oral)   Resp 14   Ht 6' (1.829 m)   Wt 227 lb (103 kg)   SpO2 98%   BMI 30.79 kg/m   Physical Exam  Constitutional: He is oriented to person, place, and time and well-developed, well-nourished, and in no distress.  HENT:  Head: Normocephalic and atraumatic.  Eyes: Conjunctivae are normal.  Neck: Neck supple.  Cardiovascular: Normal rate, regular rhythm, normal heart sounds and intact distal pulses.  Pulmonary/Chest: Effort normal and breath sounds normal. No respiratory distress. He has no wheezes. He has no rales. He exhibits no tenderness.  Neurological: He is alert and oriented to person, place, and time.  Skin: Skin is warm and dry. No rash noted.  Psychiatric: Affect normal.  Vitals reviewed.  Assessment/Plan: Essential hypertension BP well-controlled. Asymptomatic. Repeat labs today. Continue current regimen.  Hypogonadism in male Will order future testosterone level as would need to be morning draw.  Diabetes mellitus Overdue for follow-up. Foot exam performed today without abnormal findings. Patient to have eye exam copies sent to our office. Immunizations are up-to-date.  Will repeat labs today and alter regimen accordingly.  Erectile dysfunction Multifactorial. Doing well on current regimen. Continue the same.  Hyperlipemia Taking medications as directed. Reviewed dietary and exercise regimen. Labs today.  Anxiety and depression Doing very well. Continue current regimen.    Leeanne Rio, PA-C

## 2016-12-09 NOTE — Progress Notes (Signed)
Pre visit review using our clinic review tool, if applicable. No additional management support is needed unless otherwise documented below in the visit note. 

## 2016-12-10 ENCOUNTER — Other Ambulatory Visit: Payer: Self-pay | Admitting: Emergency Medicine

## 2016-12-10 ENCOUNTER — Encounter: Payer: Self-pay | Admitting: Emergency Medicine

## 2016-12-10 LAB — HEMOGLOBIN A1C
Hgb A1c MFr Bld: 7.7 % of total Hgb — ABNORMAL HIGH (ref <5.7)
MEAN PLASMA GLUCOSE: 174 (calc)
eAG (mmol/L): 9.7 (calc)

## 2016-12-10 LAB — COMPREHENSIVE METABOLIC PANEL
AG RATIO: 1.6 (calc) (ref 1.0–2.5)
ALBUMIN MSPROF: 4.6 g/dL (ref 3.6–5.1)
ALKALINE PHOSPHATASE (APISO): 68 U/L (ref 40–115)
ALT: 53 U/L — ABNORMAL HIGH (ref 9–46)
AST: 46 U/L — AB (ref 10–35)
BUN / CREAT RATIO: 27 (calc) — AB (ref 6–22)
BUN: 26 mg/dL — ABNORMAL HIGH (ref 7–25)
CHLORIDE: 101 mmol/L (ref 98–110)
CO2: 25 mmol/L (ref 20–32)
Calcium: 10 mg/dL (ref 8.6–10.3)
Creat: 0.95 mg/dL (ref 0.70–1.33)
GLOBULIN: 2.8 g/dL (ref 1.9–3.7)
GLUCOSE: 110 mg/dL — AB (ref 65–99)
POTASSIUM: 4.2 mmol/L (ref 3.5–5.3)
Sodium: 136 mmol/L (ref 135–146)
Total Bilirubin: 0.4 mg/dL (ref 0.2–1.2)
Total Protein: 7.4 g/dL (ref 6.1–8.1)

## 2016-12-10 LAB — LIPID PANEL
CHOL/HDL RATIO: 7.5 (calc) — AB (ref <5.0)
Cholesterol: 234 mg/dL — ABNORMAL HIGH (ref <200)
HDL: 31 mg/dL — ABNORMAL LOW (ref >40)
LDL Cholesterol (Calc): 154 mg/dL (calc) — ABNORMAL HIGH
NON-HDL CHOLESTEROL (CALC): 203 mg/dL — AB (ref <130)
TRIGLYCERIDES: 316 mg/dL — AB (ref <150)

## 2016-12-10 MED ORDER — ALPRAZOLAM 1 MG PO TABS: ORAL_TABLET | ORAL | 1 refills | Status: DC

## 2016-12-10 NOTE — Telephone Encounter (Signed)
Patient calling for a refill of Xanax.His last rx was for #30 which was for 15 days.  He usually gets #60 1 RF. Please advise

## 2016-12-10 NOTE — Telephone Encounter (Signed)
Fill printed and signed. Faxed to pharmacy.

## 2016-12-11 ENCOUNTER — Encounter: Payer: Self-pay | Admitting: Physician Assistant

## 2016-12-11 DIAGNOSIS — F32A Depression, unspecified: Secondary | ICD-10-CM | POA: Insufficient documentation

## 2016-12-11 DIAGNOSIS — F329 Major depressive disorder, single episode, unspecified: Secondary | ICD-10-CM | POA: Insufficient documentation

## 2016-12-11 DIAGNOSIS — F419 Anxiety disorder, unspecified: Secondary | ICD-10-CM | POA: Insufficient documentation

## 2016-12-11 NOTE — Assessment & Plan Note (Signed)
BP well-controlled. Asymptomatic. Repeat labs today. Continue current regimen.

## 2016-12-11 NOTE — Assessment & Plan Note (Signed)
Multifactorial. Doing well on current regimen. Continue the same.

## 2016-12-11 NOTE — Assessment & Plan Note (Signed)
Will order future testosterone level as would need to be morning draw.

## 2016-12-11 NOTE — Assessment & Plan Note (Signed)
Taking medications as directed. Reviewed dietary and exercise regimen. Labs today.

## 2016-12-11 NOTE — Assessment & Plan Note (Signed)
Doing very well. Continue current regimen.  

## 2016-12-11 NOTE — Assessment & Plan Note (Signed)
Overdue for follow-up. Foot exam performed today without abnormal findings. Patient to have eye exam copies sent to our office. Immunizations are up-to-date. Will repeat labs today and alter regimen accordingly.

## 2016-12-12 ENCOUNTER — Other Ambulatory Visit: Payer: Self-pay | Admitting: Physician Assistant

## 2016-12-12 DIAGNOSIS — R7989 Other specified abnormal findings of blood chemistry: Secondary | ICD-10-CM

## 2016-12-12 DIAGNOSIS — R945 Abnormal results of liver function studies: Principal | ICD-10-CM

## 2016-12-25 ENCOUNTER — Other Ambulatory Visit: Payer: Self-pay | Admitting: Physician Assistant

## 2016-12-25 MED FILL — ALPRAZolam 1 MG TABS: 1 | 30 days supply | Qty: 60 | Fill #0

## 2016-12-25 NOTE — Telephone Encounter (Signed)
I have spoken with the pharmacy and he does have a refill available and they are going to fill it.  Patient has been made aware.

## 2016-12-25 NOTE — Telephone Encounter (Signed)
Copied from [place] [phone]. Topic: Quick Communication - See Telephone Encounter >> Dec 25, 2016  2:11 PM Aurelio Brash B wrote: CRM for notification. See Telephone encounter for:  Refill xanax  pt is out of meds and been out of town  Pt wants to pick this up today [place] 12/25/16.

## 2016-12-25 NOTE — Telephone Encounter (Signed)
Spoke with pt regarding refill requests; pt states that he has used medication and his refill; pt would like for prescription to be faxed to out patient pharmacy at [facility]; will contact [place] for direction, and notify pt; pt can be reached at [phone]; contacted Bethany at [facility] and she will follow up with pt

## 2017-01-05 ENCOUNTER — Encounter: Payer: Self-pay | Admitting: Physician Assistant

## 2017-01-05 NOTE — Telephone Encounter (Signed)
William [name], please see patient message and attachment. Another issue with [employer].

## 2017-01-06 ENCOUNTER — Other Ambulatory Visit (INDEPENDENT_AMBULATORY_CARE_PROVIDER_SITE_OTHER): Payer: Federal, State, Local not specified - PPO

## 2017-01-06 DIAGNOSIS — R945 Abnormal results of liver function studies: Secondary | ICD-10-CM

## 2017-01-06 DIAGNOSIS — R7989 Other specified abnormal findings of blood chemistry: Secondary | ICD-10-CM

## 2017-01-06 DIAGNOSIS — E291 Testicular hypofunction: Secondary | ICD-10-CM

## 2017-01-06 LAB — TESTOSTERONE: Testosterone: 184.16 ng/dL — ABNORMAL LOW (ref 300.00–890.00)

## 2017-01-07 LAB — COMPREHENSIVE METABOLIC PANEL
ALT: 33 U/L (ref 0–53)
AST: 21 U/L (ref 0–37)
Albumin: 4.3 g/dL (ref 3.5–5.2)
Alkaline Phosphatase: 64 U/L (ref 39–117)
BILIRUBIN TOTAL: 0.2 mg/dL (ref 0.2–1.2)
BUN: 21 mg/dL (ref 6–23)
CO2: 23 meq/L (ref 19–32)
Calcium: 9.4 mg/dL (ref 8.4–10.5)
Chloride: 99 mEq/L (ref 96–112)
Creatinine, Ser: 0.92 mg/dL (ref 0.40–1.50)
GFR: 89.63 mL/min (ref >60.00)
GLUCOSE: 358 mg/dL — AB (ref 70–99)
Potassium: 4.4 mEq/L (ref 3.5–5.1)
SODIUM: 133 meq/L — AB (ref 135–145)
Total Protein: 6.8 g/dL (ref 6.0–8.3)

## 2017-01-08 ENCOUNTER — Encounter: Payer: Self-pay | Admitting: Physician Assistant

## 2017-01-09 DIAGNOSIS — M546 Pain in thoracic spine: Secondary | ICD-10-CM | POA: Diagnosis not present

## 2017-01-09 DIAGNOSIS — M545 Low back pain: Secondary | ICD-10-CM | POA: Diagnosis not present

## 2017-01-09 DIAGNOSIS — M9902 Segmental and somatic dysfunction of thoracic region: Secondary | ICD-10-CM | POA: Diagnosis not present

## 2017-01-09 DIAGNOSIS — M9903 Segmental and somatic dysfunction of lumbar region: Secondary | ICD-10-CM | POA: Diagnosis not present

## 2017-01-23 MED FILL — ALPRAZolam 1 MG TABS: 1 | 30 days supply | Qty: 60 | Fill #1

## 2017-02-03 ENCOUNTER — Other Ambulatory Visit: Payer: Self-pay | Admitting: Physician Assistant

## 2017-02-03 MED FILL — LISINOPRIL-HCTZ 10-12.5 MG: 10-12.5 | 90 days supply | Qty: 90 | Fill #0

## 2017-02-03 MED FILL — metFORMIN HCL 1000 MG TABS: 1000 | 90 days supply | Qty: 180 | Fill #0

## 2017-02-03 MED FILL — FENOFIBRATE 145 MG TAB: 145 | 30 days supply | Qty: 30 | Fill #3

## 2017-02-03 MED FILL — ATORVASTATIN 40 MG TABLET: 40 | 30 days supply | Qty: 30 | Fill #0

## 2017-02-03 MED FILL — BUPROPION HCL XL 300 MG TAB: 300 | 90 days supply | Qty: 90 | Fill #0

## 2017-02-10 ENCOUNTER — Other Ambulatory Visit: Payer: Self-pay | Admitting: Physician Assistant

## 2017-02-11 MED FILL — SERTRALINE HCL 100 MG TAB: 100 | 90 days supply | Qty: 180 | Fill #0

## 2017-02-13 DIAGNOSIS — H2513 Age-related nuclear cataract, bilateral: Secondary | ICD-10-CM | POA: Diagnosis not present

## 2017-02-13 DIAGNOSIS — H04123 Dry eye syndrome of bilateral lacrimal glands: Secondary | ICD-10-CM | POA: Diagnosis not present

## 2017-02-13 DIAGNOSIS — H40013 Open angle with borderline findings, low risk, bilateral: Secondary | ICD-10-CM | POA: Diagnosis not present

## 2017-02-13 DIAGNOSIS — E119 Type 2 diabetes mellitus without complications: Secondary | ICD-10-CM | POA: Diagnosis not present

## 2017-02-13 LAB — HM DIABETES EYE EXAM

## 2017-02-17 ENCOUNTER — Encounter: Payer: Self-pay | Admitting: Physician Assistant

## 2017-02-24 ENCOUNTER — Other Ambulatory Visit: Payer: Self-pay | Admitting: Physician Assistant

## 2017-02-24 MED FILL — ALPRAZolam 1 MG TABS: 1 | 30 days supply | Qty: 60 | Fill #0

## 2017-02-24 NOTE — Telephone Encounter (Signed)
Last rx for Xanax on 12/10/16 #60 1 RF CSC: 01/03/16 UDS: 01/04/16  Please advise

## 2017-02-24 NOTE — Telephone Encounter (Signed)
Will give 1 refill. He needs updated CSC and UDS before additional refills will be given.

## 2017-03-03 DIAGNOSIS — E113211 Type 2 diabetes mellitus with mild nonproliferative diabetic retinopathy with macular edema, right eye: Secondary | ICD-10-CM | POA: Diagnosis not present

## 2017-03-03 DIAGNOSIS — H43813 Vitreous degeneration, bilateral: Secondary | ICD-10-CM | POA: Diagnosis not present

## 2017-03-03 DIAGNOSIS — E113292 Type 2 diabetes mellitus with mild nonproliferative diabetic retinopathy without macular edema, left eye: Secondary | ICD-10-CM | POA: Diagnosis not present

## 2017-03-03 DIAGNOSIS — H35362 Drusen (degenerative) of macula, left eye: Secondary | ICD-10-CM | POA: Diagnosis not present

## 2017-03-12 ENCOUNTER — Other Ambulatory Visit: Payer: Self-pay | Admitting: Physician Assistant

## 2017-03-12 MED ORDER — BASAGLAR KWIKPEN 100 UNIT/ML ~~LOC~~ SOPN: 100.0000 [IU] | PEN_INJECTOR | SUBCUTANEOUS | 1 refills | Status: DC

## 2017-03-12 MED FILL — BASAGLAR 100 UNIT/ML KWIKPE: 100 | 90 days supply | Qty: 90 | Fill #0

## 2017-03-12 MED FILL — FENOFIBRATE 145 MG TAB: 145 | 30 days supply | Qty: 30 | Fill #4

## 2017-03-13 ENCOUNTER — Other Ambulatory Visit: Payer: Self-pay | Admitting: Physician Assistant

## 2017-03-14 ENCOUNTER — Telehealth: Payer: Federal, State, Local not specified - PPO | Admitting: Family

## 2017-03-14 ENCOUNTER — Other Ambulatory Visit: Payer: Self-pay | Admitting: Physician Assistant

## 2017-03-14 ENCOUNTER — Encounter: Payer: Self-pay | Admitting: Emergency Medicine

## 2017-03-14 DIAGNOSIS — J029 Acute pharyngitis, unspecified: Secondary | ICD-10-CM

## 2017-03-14 MED ORDER — ONDANSETRON 4 MG PO TBDP: 4.0000 mg | ORAL_TABLET | Freq: Three times a day (TID) | ORAL | 0 refills | Status: DC | PRN

## 2017-03-14 MED ORDER — PREDNISONE 5 MG PO TABS: 5.0000 mg | ORAL_TABLET | ORAL | 0 refills | Status: DC

## 2017-03-14 NOTE — Telephone Encounter (Signed)
Mychart message sent to patient.

## 2017-03-14 NOTE — Telephone Encounter (Signed)
PCP states patient can do a E-visit for treatment of symptoms. Since is unable to come in to be seen

## 2017-03-14 NOTE — Telephone Encounter (Signed)
Patient states that he has post nasal drip, no energy. He states that he wants a Z-Pak called in.  Advised patient that typically we cannot call in ABX without him being seen.   He said that he does not have time or energy to come in to be seen.    Patient states that he has a lot of traveling to do starting Sunday and he wants this so that can feel better for these trips.   Attempted again to get patient to come in for an appointment and he said that if [facility] needed to discuss this with him, he would be available for a phone visit and Einar Pheasant can call him at  [phone].

## 2017-03-14 NOTE — Telephone Encounter (Signed)
Copied from [place]. Topic: General - Other >> Mar 14, 2017 11:51 AM Darl Householder, RMA wrote: Reason for CRM: patient is requesting a call back concerning a prescription for a Z-pack, pt states he does not have time for a appointment just wants medication

## 2017-03-14 NOTE — Progress Notes (Signed)
Thank you for the details you included in the comment boxes. Those details are very helpful in determining the best course of treatment for you and help [place] to provide the best care. The current standard of care is 5-7 days of illness before antibiotics are given. I wanted you to know this as it is the evidence-based guideline the E-Visit program operates by. However, approximately 89% of these infections are viral and they do respond very well to supportive care and measures to decrease inflammation, which we will provide for you below.   E visit for Flu like symptoms   We are sorry that you are not feeling well.  Here is how we plan to help! Based on what you have shared with me it looks like you may have flu-like symptoms that should be watched but do not seem to indicate anti-viral treatment. Given that this may be another virus aside from the flu, I have sent a steroid dose pack to your pharmacy to help with symptom relief. Be aware that steroids may increase blood sugar slightly, but a low-dose pack will have minimal effect on sugar and will provide other benefits.   Influenza or "the flu" is   an infection caused by a respiratory virus. The flu virus is highly contagious and persons who did not receive their yearly flu vaccination may "catch" the flu from close contact.  We have anti-viral medications to treat the viruses that cause this infection. They are not a "cure" and only shorten the course of the infection. These prescriptions are most effective when they are given within the first 2 days of "flu" symptoms. Antiviral medication are indicated if you have a high risk of complications from the flu. You should  also consider an antiviral medication if you are in close contact with someone who is at risk. These medications can help patients avoid complications from the flu  but have side effects that you should know. Possible side effects from Tamiflu or oseltamivir include nausea, vomiting,  diarrhea, dizziness, headaches, eye redness, sleep problems or other respiratory symptoms. You should not take Tamiflu if you have an allergy to oseltamivir or any to the ingredients in Tamiflu.  Based upon your symptoms and potential risk factors I recommend that you follow the flu symptoms recommendation that I have listed below.  ANYONE WHO HAS FLU SYMPTOMS SHOULD: . Stay home. The flu is highly contagious and going out or to work exposes others! . Be sure to drink plenty of fluids. Water is fine as well as fruit juices, sodas and electrolyte beverages. You may want to stay away from caffeine or alcohol. If you are nauseated, try taking small sips of liquids. How do you know if you are getting enough fluid? Your urine should be a pale yellow or almost colorless. . Get rest. . Taking a steamy shower or using a humidifier may help nasal congestion and ease sore throat pain. Using a saline nasal spray works much the same way. . Cough drops, hard candies and sore throat lozenges may ease your cough. . Line up a caregiver. Have someone check on you regularly.   GET HELP RIGHT AWAY IF: . You cannot keep down liquids or your medications. . You become short of breath . Your fell like you are going to pass out or loose consciousness. . Your symptoms persist after you have completed your treatment plan MAKE SURE YOU   Understand these instructions.  Will watch your condition.  Will get help right  away if you are not doing well or get worse.  Your e-visit answers were reviewed by a board certified advanced clinical practitioner to complete your personal care plan.  Depending on the condition, your plan could have included both over the counter or prescription medications.  If there is a problem please reply  once you have received a response from your provider.  Your safety is important to [place].  If you have drug allergies check your prescription carefully.    You can use MyChart to ask  questions about today's visit, request a non-urgent call back, or ask for a work or school excuse for 24 hours related to this e-Visit. If it has been greater than 24 hours you will need to follow up with your provider, or enter a new e-Visit to address those concerns.  You will get an e-mail in the next two days asking about your experience.  I hope that your e-visit has been valuable and will speed your recovery. Thank you for using e-visits.

## 2017-03-14 NOTE — Telephone Encounter (Signed)
Last Xanax rx on 02/25/16 #60 [facility]: 01/03/16 UDS: 01/05/16 Last OV 12/09/16 ED  Please advise.

## 2017-03-14 NOTE — Addendum Note (Signed)
Addended by: Chevis Pretty on: 03/14/2017 05:42 PM   Modules accepted: Orders

## 2017-03-24 ENCOUNTER — Encounter: Payer: Self-pay | Admitting: Physician Assistant

## 2017-03-24 DIAGNOSIS — M9903 Segmental and somatic dysfunction of lumbar region: Secondary | ICD-10-CM | POA: Diagnosis not present

## 2017-03-24 DIAGNOSIS — M9902 Segmental and somatic dysfunction of thoracic region: Secondary | ICD-10-CM | POA: Diagnosis not present

## 2017-03-24 DIAGNOSIS — M546 Pain in thoracic spine: Secondary | ICD-10-CM | POA: Diagnosis not present

## 2017-03-24 DIAGNOSIS — M545 Low back pain: Secondary | ICD-10-CM | POA: Diagnosis not present

## 2017-03-24 MED ORDER — ALPRAZOLAM 1 MG PO TABS: ORAL_TABLET | ORAL | 0 refills | Status: DC

## 2017-03-24 MED FILL — ALPRAZolam 1 MG TABS: 1 | 30 days supply | Qty: 60 | Fill #0

## 2017-03-25 DIAGNOSIS — K080 Exfoliation of teeth due to systemic causes: Secondary | ICD-10-CM | POA: Diagnosis not present

## 2017-03-25 MED FILL — PREVIDENT 5000 1.1% DRY MOU: 1.1 | 30 days supply | Qty: 100 | Fill #0

## 2017-03-27 DIAGNOSIS — K080 Exfoliation of teeth due to systemic causes: Secondary | ICD-10-CM | POA: Diagnosis not present

## 2017-04-04 ENCOUNTER — Encounter: Payer: Self-pay | Admitting: Physician Assistant

## 2017-04-04 ENCOUNTER — Other Ambulatory Visit: Payer: Self-pay

## 2017-04-04 ENCOUNTER — Ambulatory Visit: Payer: Federal, State, Local not specified - PPO | Admitting: Physician Assistant

## 2017-04-04 VITALS — BP 126/80 | HR 85 | Temp 98.4°F | Resp 16 | Ht 72.0 in | Wt 231.0 lb

## 2017-04-04 DIAGNOSIS — F419 Anxiety disorder, unspecified: Secondary | ICD-10-CM | POA: Diagnosis not present

## 2017-04-04 DIAGNOSIS — F329 Major depressive disorder, single episode, unspecified: Secondary | ICD-10-CM | POA: Diagnosis not present

## 2017-04-04 DIAGNOSIS — E119 Type 2 diabetes mellitus without complications: Secondary | ICD-10-CM

## 2017-04-04 DIAGNOSIS — B9689 Other specified bacterial agents as the cause of diseases classified elsewhere: Secondary | ICD-10-CM | POA: Diagnosis not present

## 2017-04-04 DIAGNOSIS — F32A Depression, unspecified: Secondary | ICD-10-CM

## 2017-04-04 DIAGNOSIS — J208 Acute bronchitis due to other specified organisms: Secondary | ICD-10-CM

## 2017-04-04 DIAGNOSIS — E291 Testicular hypofunction: Secondary | ICD-10-CM | POA: Diagnosis not present

## 2017-04-04 DIAGNOSIS — Z794 Long term (current) use of insulin: Secondary | ICD-10-CM

## 2017-04-04 LAB — COMPREHENSIVE METABOLIC PANEL
ALK PHOS: 66 U/L (ref 39–117)
ALT: 40 U/L (ref 0–53)
AST: 29 U/L (ref 0–37)
Albumin: 4.2 g/dL (ref 3.5–5.2)
BILIRUBIN TOTAL: 0.3 mg/dL (ref 0.2–1.2)
BUN: 20 mg/dL (ref 6–23)
CO2: 27 meq/L (ref 19–32)
Calcium: 9.5 mg/dL (ref 8.4–10.5)
Chloride: 100 mEq/L (ref 96–112)
Creatinine, Ser: 0.94 mg/dL (ref 0.40–1.50)
GFR: 87.36 mL/min (ref >60.00)
GLUCOSE: 224 mg/dL — AB (ref 70–99)
Potassium: 4.5 mEq/L (ref 3.5–5.1)
SODIUM: 136 meq/L (ref 135–145)
TOTAL PROTEIN: 7.1 g/dL (ref 6.0–8.3)

## 2017-04-04 LAB — LIPID PANEL
Cholesterol: 151 mg/dL (ref 0–200)
HDL: 25.5 mg/dL — ABNORMAL LOW (ref >39.00)
NONHDL: 125.66
Total CHOL/HDL Ratio: 6
Triglycerides: 242 mg/dL — ABNORMAL HIGH (ref 0.0–149.0)
VLDL: 48.4 mg/dL — ABNORMAL HIGH (ref 0.0–40.0)

## 2017-04-04 LAB — LDL CHOLESTEROL, DIRECT: LDL DIRECT: 92 mg/dL

## 2017-04-04 LAB — TESTOSTERONE: Testosterone: 254.23 ng/dL — ABNORMAL LOW (ref 300.00–890.00)

## 2017-04-04 LAB — HEMOGLOBIN A1C: HEMOGLOBIN A1C: 8.9 % — AB (ref 4.6–6.5)

## 2017-04-04 MED ORDER — BENZONATATE 100 MG PO CAPS: 100.0000 mg | ORAL_CAPSULE | Freq: Two times a day (BID) | ORAL | 0 refills | Status: DC | PRN

## 2017-04-04 MED ORDER — FLUTICASONE PROPIONATE 50 MCG/ACT NA SUSP: 2.0000 | Freq: Every day | NASAL | 6 refills | Status: DC

## 2017-04-04 MED ORDER — AZITHROMYCIN 250 MG PO TABS: ORAL_TABLET | ORAL | 0 refills | Status: DC

## 2017-04-04 MED FILL — FLUTICASONE PROP 50 MCG SPR: 50 | 30 days supply | Qty: 16 | Fill #0

## 2017-04-04 MED FILL — AZITHROMYCIN 250 MG TABLET: 250 | 5 days supply | Qty: 6 | Fill #0

## 2017-04-04 MED FILL — BENZONATATE 100 MG CAPSULE: 100 | 10 days supply | Qty: 20 | Fill #0

## 2017-04-04 NOTE — Progress Notes (Signed)
Patient presents to clinic today for follow-up of anxiety and depressed mood. Patient currently on a regimen of Sertraline 100 mg BID and Wellbutrin 300 mg daily. Is taking medications as directed. Has been well controlled on this regimen previously. Has noted significant increase in anxiety, having impact on sleep and focus at work. Notes increase in anxiety since having a incident at work where he has been falsely accused of some things. He is in the process of having an attorney and getting a further investigation of things. The stress of dealing with all of this is the main cause of increased anxiety levels. Would like to discuss potential counseling and medication changes.   Patient also due for repeat labs. Would like reassessment of testosterone levels and is requesting we take over treatment if repeat levels are still low.   Patient also with continued chest congestion, cough that is productive, fatigue and nasal congestion. Endorses intermittent sore throat described as scratchy. Denies recent travel.   Past Medical History:  Diagnosis Date  . Adjustment disorder with mixed anxiety and depressed mood   . Arthritis   . Asthma    history  . Diabetes mellitus    type 2  . Elevated blood pressure    history of high blood pressure readings  . History of chicken pox   . Hypercholesteremia   . Hypertension   . Internal hemorrhoids   . Tubular adenoma of colon     Current Outpatient Medications on File Prior to Visit  Medication Sig Dispense Refill  . ALPRAZolam (XANAX) 1 MG tablet TAKE 1-2 TABLETS BY MOUTH DAILY AT BEDTIME AS NEEDED FOR SLEEP OR ANXIETY 60 tablet 0  . atorvastatin (LIPITOR) 40 MG tablet TAKE 1 TABLET BY MOUTH DAILY 30 tablet 0  . BD ULTRA-FINE PEN NEEDLES 29G X 12.7MM MISC USE AS DIRECTED TO INJECT LANTUS 100 each 3  . Blood Glucose Monitoring Suppl (ONE TOUCH ULTRA SYSTEM KIT) W/DEVICE KIT 1 kit by Does not apply route once. Use to check blood sugar four times  daily [Patient not checking daily]    . buPROPion (WELLBUTRIN XL) 300 MG 24 hr tablet TAKE 1 TABLET BY MOUTH DAILY. 90 tablet 1  . fenofibrate (TRICOR) 145 MG tablet TAKE 1 TABLET (145 MG TOTAL) BY MOUTH DAILY. 30 tablet 5  . glucose blood test strip Use as instructed 200 each 12  . Insulin Glargine (BASAGLAR KWIKPEN) 100 UNIT/ML SOPN Inject 1 mL (100 Units total) into the skin every morning. 90 mL 1  . lisinopril-hydrochlorothiazide (PRINZIDE,ZESTORETIC) 10-12.5 MG tablet Take 1 tablet by mouth daily. 90 tablet 1  . metFORMIN (GLUCOPHAGE) 1000 MG tablet TAKE 1 TABLET BY MOUTH TWICE DAILY WITH A MEAL 180 tablet 1  . ondansetron (ZOFRAN-ODT) 4 MG disintegrating tablet Take 1 tablet (4 mg total) by mouth every 8 (eight) hours as needed. 20 tablet 0  . ONE TOUCH LANCETS MISC Use as directed 200 each 11  . sertraline (ZOLOFT) 100 MG tablet TAKE 1 TABLET BY MOUTH TWICE DAILY 180 tablet 1  . sildenafil (VIAGRA) 100 MG tablet Take 0.5-1 tablets (50-100 mg total) daily as needed by mouth for erectile dysfunction. 30 tablet 0   No current facility-administered medications on file prior to visit.     No Known Allergies  Family History  Problem Relation Age of Onset  . Diabetes Mother   . Thyroid disease Mother        Questionable  . Heart disease Father  deceased  . Other Neg Hx        hypogonadism  . Colon cancer Neg Hx     Social History   Socioeconomic History  . Marital status: Married    Spouse name: None  . Number of children: None  . Years of education: None  . Highest education level: None  Social Needs  . Financial resource strain: None  . Food insecurity - worry: None  . Food insecurity - inability: None  . Transportation needs - medical: None  . Transportation needs - non-medical: None  Occupational History  . None  Tobacco Use  . Smoking status: Never Smoker  . Smokeless tobacco: Never Used  Substance and Sexual Activity  . Alcohol use: Yes    Alcohol/week:  4.2 - 8.4 oz    Types: 7 - 14 Glasses of wine per week  . Drug use: No  . Sexual activity: None  Other Topics Concern  . 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  All other ROS are negative.  BP 126/80   Pulse 85   Temp 98.4 F (36.9 C) (Oral)   Resp 16   Ht 6' (1.829 m)   Wt 231 lb (104.8 kg)   SpO2 96%   BMI 31.33 kg/m   Physical Exam  Constitutional: He is oriented to person, place, and time and well-developed, well-nourished, and in no distress.  HENT:  Head: Normocephalic and atraumatic.  Eyes: Conjunctivae are normal.  Cardiovascular: Normal rate, regular rhythm, normal heart sounds and intact distal pulses.  Pulmonary/Chest: Effort normal and breath sounds normal.  Neurological: He is alert and oriented to person, place, and time.  Skin: Skin is warm and dry. No rash noted.  Psychiatric: Affect normal.  Vitals reviewed.  Recent Results (from the past 2160 hour(s))  HM DIABETES EYE EXAM     Status: Abnormal   Collection Time: 02/13/17 12:00 AM  Result Value Ref Range   HM Diabetic Eye Exam Retinopathy (A) No Retinopathy  Testosterone     Status: Abnormal   Collection Time: 04/04/17 11:56 AM  Result Value Ref Range   Testosterone 254.23 (L) 300.00 - 890.00 ng/dL  Hemoglobin A1c     Status: Abnormal   Collection Time: 04/04/17 11:56 AM  Result Value Ref Range   Hgb A1c MFr Bld 8.9 (H) 4.6 - 6.5 %    Comment: Glycemic Control Guidelines for People with Diabetes:Non Diabetic:  <6%Goal of Therapy: <7%Additional Action Suggested:  >8%   Comp Met (CMET)     Status: Abnormal   Collection Time: 04/04/17 11:56 AM  Result Value Ref  Range   Sodium 136 135 - 145 mEq/L   Potassium 4.5 3.5 - 5.1 mEq/L   Chloride 100 96 - 112 mEq/L   CO2 27 19 - 32 mEq/L   Glucose, Bld 224 (H) 70 - 99 mg/dL   BUN 20 6 - 23 mg/dL   Creatinine, Ser 0.94 0.40 - 1.50 mg/dL   Total Bilirubin 0.3 0.2 -  1.2 mg/dL   Alkaline Phosphatase 66 39 - 117 U/L   AST 29 0 - 37 U/L   ALT 40 0 - 53 U/L   Total Protein 7.1 6.0 - 8.3 g/dL   Albumin 4.2 3.5 - 5.2 g/dL   Calcium 9.5 8.4 - 10.5 mg/dL   GFR 87.36 >60.00 mL/min  Lipid Profile     Status: Abnormal   Collection Time: 04/04/17 11:56 AM  Result Value Ref Range   Cholesterol 151 0 - 200 mg/dL    Comment: ATP III Classification       Desirable:  < 200 mg/dL               Borderline High:  200 - 239 mg/dL          High:  > = 240 mg/dL   Triglycerides 242.0 (H) 0.0 - 149.0 mg/dL    Comment: Normal:  <150 mg/dLBorderline High:  150 - 199 mg/dL   HDL 25.50 (L) >39.00 mg/dL   VLDL 48.4 (H) 0.0 - 40.0 mg/dL   Total CHOL/HDL Ratio 6     Comment:                Men          Women1/2 Average Risk     3.4          3.3Average Risk          5.0          4.42X Average Risk          9.6          7.13X Average Risk          15.0          11.0                       NonHDL 125.66     Comment: NOTE:  Non-HDL goal should be 30 mg/dL higher than patient's LDL goal (i.e. LDL goal of < 70 mg/dL, would have non-HDL goal of < 100 mg/dL)  LDL cholesterol, direct     Status: None   Collection Time: 04/04/17 11:56 AM  Result Value Ref Range   Direct LDL 92.0 mg/dL    Comment: Optimal:  <100 mg/dLNear or Above Optimal:  100-129 mg/dLBorderline High:  130-159 mg/dLHigh:  160-189 mg/dLVery High:  >190 mg/dL   Assessment/Plan: Hypogonadism in male Repeat levels today. If still low will need PSA prior to starting treatment.   Diabetes mellitus Repeat labs today. Foot exam and eye examination up-to-date.  Immunization up-to-date.  Anxiety and depression Deteriorated due to recent stressors. No alarm signs/symptoms.  Discussed medication regimen changes and counseling.  For now he would like to continue on current regimen and keep close eye on symptoms. Hopeful this stress is situational and anxiety will improve as the situation changes. This incident at work is clearly having an impact on him. Will monitor closely.   Acute bacterial bronchitis Start Flonase and Azithromycin. Tessalon for cough. OTC medications and supportive measures reviewed.  - fluticasone (FLONASE) 50 MCG/ACT nasal spray; Place 2 sprays into both nostrils daily.  Dispense: 16 g; Refill: [place], [place]

## 2017-04-04 NOTE — Patient Instructions (Signed)
Take antibiotic (Azithromycin) as directed.  Increase fluids.  Get plenty of rest. Use Mucinex for congestion. Tessalon for cough and Flonase for nasal congestion. Do not use Afrin for more than 2 days at a time. . Take a daily probiotic (I recommend Align or Culturelle, but even Activia Yogurt may be beneficial).  A humidifier placed in the bedroom may offer some relief for a dry, scratchy throat of nasal irritation.  Read information below on acute bronchitis. Please call or return to clinic if symptoms are not improving.  Acute Bronchitis Bronchitis is when the airways that extend from the windpipe into the lungs get red, puffy, and painful (inflamed). Bronchitis often causes thick spit (mucus) to develop. This leads to a cough. A cough is the most common symptom of bronchitis. In acute bronchitis, the condition usually begins suddenly and goes away over time (usually in 2 weeks). Smoking, allergies, and asthma can make bronchitis worse. Repeated episodes of bronchitis may cause more lung problems.  HOME CARE  Rest.  Drink enough fluids to keep your pee (urine) clear or pale yellow (unless you need to limit fluids as told by your doctor).  Only take over-the-counter or prescription medicines as told by your doctor.  Avoid smoking and secondhand smoke. These can make bronchitis worse. If you are a smoker, think about using nicotine gum or skin patches. Quitting smoking will help your lungs heal faster.  Reduce the chance of getting bronchitis again by:  Washing your hands often.  Avoiding people with cold symptoms.  Trying not to touch your hands to your mouth, nose, or eyes.  Follow up with your doctor as told.  GET HELP IF: Your symptoms do not improve after 1 week of treatment. Symptoms include:  Cough.  Fever.  Coughing up thick spit.  Body aches.  Chest congestion.  Chills.  Shortness of breath.  Sore throat.  GET HELP RIGHT AWAY IF:   You have an increased  fever.  You have chills.  You have severe shortness of breath.  You have bloody thick spit (sputum).  You throw up (vomit) often.  You lose too much body fluid (dehydration).  You have a severe headache.  You faint.  MAKE SURE YOU:   Understand these instructions.  Will watch your condition.  Will get help right away if you are not doing well or get worse. Document Released: 07/10/2007 Document Revised: 09/23/2012 Document Reviewed: 07/14/2012 [facility] Patient Information 2015 [place], [place]. This information is not intended to replace advice given to you by your health care provider. Make sure you discuss any questions you have with your health care provider.

## 2017-04-05 ENCOUNTER — Encounter: Payer: Self-pay | Admitting: Physician Assistant

## 2017-04-07 MED ORDER — HYDROCOD POLST-CPM POLST ER 10-8 MG/5ML PO SUER: 5.0000 mL | Freq: Two times a day (BID) | ORAL | 0 refills | Status: DC | PRN

## 2017-04-07 MED ORDER — DOXYCYCLINE HYCLATE 100 MG PO CAPS: 100.0000 mg | ORAL_CAPSULE | Freq: Two times a day (BID) | ORAL | 0 refills | Status: DC

## 2017-04-07 MED FILL — DOXYCYCLINE HYCLATE 100 MG: 100 | 7 days supply | Qty: 14 | Fill #0

## 2017-04-07 MED FILL — HYDROCODONE-CHLORPHENIRAM S: 10-8 | 14 days supply | Qty: 140 | Fill #0

## 2017-04-08 ENCOUNTER — Encounter: Payer: Self-pay | Admitting: Physician Assistant

## 2017-04-08 NOTE — Assessment & Plan Note (Signed)
Repeat labs today. Foot exam and eye examination up-to-date.  Immunization up-to-date.

## 2017-04-08 NOTE — Assessment & Plan Note (Signed)
Deteriorated due to recent stressors. No alarm signs/symptoms. Discussed medication regimen changes and counseling.  For now he would like to continue on current regimen and keep close eye on symptoms. Hopeful this stress is situational and anxiety will improve as the situation changes. This incident at work is clearly having an impact on him. Will monitor closely.

## 2017-04-08 NOTE — Assessment & Plan Note (Signed)
Repeat levels today. If still low will need PSA prior to starting treatment.

## 2017-04-16 MED ORDER — PROMETHAZINE-DM 6.25-15 MG/5ML PO SYRP: 5.0000 mL | ORAL_SOLUTION | Freq: Four times a day (QID) | ORAL | 0 refills | Status: DC | PRN

## 2017-04-16 MED FILL — PROMETHAZINE-DM SYRUP: 6.25-15 | 6 days supply | Qty: 118 | Fill #0

## 2017-04-21 ENCOUNTER — Other Ambulatory Visit: Payer: Self-pay | Admitting: Physician Assistant

## 2017-04-21 MED FILL — ALPRAZolam 1 MG TABS: 1 | 30 days supply | Qty: 60 | Fill #0

## 2017-04-22 MED FILL — FENOFIBRATE 145 MG TAB: 145 | 30 days supply | Qty: 30 | Fill #5

## 2017-05-21 ENCOUNTER — Other Ambulatory Visit: Payer: Self-pay | Admitting: Physician Assistant

## 2017-05-21 MED FILL — BUPROPION HCL XL 300 MG TAB: 300 | 90 days supply | Qty: 90 | Fill #1

## 2017-05-21 MED FILL — SERTRALINE HCL 100 MG TAB: 100 | 90 days supply | Qty: 180 | Fill #1

## 2017-05-21 MED FILL — LISINOPRIL-HCTZ 10-12.5 MG: 10-12.5 | 90 days supply | Qty: 90 | Fill #1

## 2017-05-21 MED FILL — metFORMIN HCL 1000 MG TABS: 1000 | 90 days supply | Qty: 180 | Fill #1

## 2017-05-21 NOTE — Telephone Encounter (Signed)
Last OV- 04/04/2017 Fill Date: 04/21/2017

## 2017-05-22 MED FILL — ATORVASTATIN 40 MG TABLET: 40 | 30 days supply | Qty: 30 | Fill #0

## 2017-05-22 MED FILL — FENOFIBRATE 145 MG TAB: 145 | 30 days supply | Qty: 30 | Fill #0

## 2017-05-22 MED FILL — ALPRAZolam 1 MG TABS: 1 | 30 days supply | Qty: 60 | Fill #0

## 2017-06-10 MED FILL — PREVIDENT 5000 1.1% DRY MOU: 1.1 | 30 days supply | Qty: 100 | Fill #0

## 2017-06-20 ENCOUNTER — Encounter: Payer: Self-pay | Admitting: Emergency Medicine

## 2017-06-20 ENCOUNTER — Other Ambulatory Visit: Payer: Self-pay | Admitting: Physician Assistant

## 2017-06-20 ENCOUNTER — Other Ambulatory Visit: Payer: Self-pay | Admitting: Emergency Medicine

## 2017-06-20 DIAGNOSIS — Z79899 Other long term (current) drug therapy: Secondary | ICD-10-CM

## 2017-06-20 MED ORDER — ALPRAZOLAM 1 MG PO TABS: ORAL_TABLET | ORAL | 0 refills | Status: DC

## 2017-06-20 MED FILL — ALPRAZolam 1 MG TABS: 1 | 15 days supply | Qty: 30 | Fill #0

## 2017-06-20 NOTE — Telephone Encounter (Signed)
My chart message sent to patient. Contract up front for signature and order placed for UDS.

## 2017-06-20 NOTE — Telephone Encounter (Signed)
Xanax refill last on 05/22/17 #60 CSC: 01/03/16 UDS: 01/05/16 Last OV: 04/04/17 Acute bronchitis  Please advise

## 2017-06-20 NOTE — Telephone Encounter (Signed)
Patient needs CSC and UDS. He is also likely due for a follow-up. I will given him 15 tablets of his xanax until he can come in to sign updated CSC and provide UDS sample. No further refills until this is done.

## 2017-06-21 ENCOUNTER — Encounter: Payer: Self-pay | Admitting: Physician Assistant

## 2017-06-23 ENCOUNTER — Other Ambulatory Visit: Payer: Federal, State, Local not specified - PPO

## 2017-06-23 DIAGNOSIS — Z79899 Other long term (current) drug therapy: Secondary | ICD-10-CM

## 2017-06-24 ENCOUNTER — Other Ambulatory Visit: Payer: Self-pay | Admitting: Physician Assistant

## 2017-06-24 DIAGNOSIS — K080 Exfoliation of teeth due to systemic causes: Secondary | ICD-10-CM | POA: Diagnosis not present

## 2017-06-24 MED FILL — BASAGLAR 100 UNIT/ML KWIKPE: 100 | 90 days supply | Qty: 90 | Fill #1

## 2017-06-24 MED FILL — ATORVASTATIN 40 MG TABLET: 40 | 30 days supply | Qty: 30 | Fill #1

## 2017-06-24 MED FILL — FENOFIBRATE 145 MG TAB: 145 | 30 days supply | Qty: 30 | Fill #1

## 2017-06-26 DIAGNOSIS — M9902 Segmental and somatic dysfunction of thoracic region: Secondary | ICD-10-CM | POA: Diagnosis not present

## 2017-06-26 DIAGNOSIS — M9903 Segmental and somatic dysfunction of lumbar region: Secondary | ICD-10-CM | POA: Diagnosis not present

## 2017-06-26 DIAGNOSIS — M546 Pain in thoracic spine: Secondary | ICD-10-CM | POA: Diagnosis not present

## 2017-06-26 DIAGNOSIS — M545 Low back pain: Secondary | ICD-10-CM | POA: Diagnosis not present

## 2017-06-27 LAB — PAIN MGMT, PROFILE 8 W/CONF, U
6 ACETYLMORPHINE: NEGATIVE ng/mL (ref <10)
ALCOHOL METABOLITES: NEGATIVE ng/mL (ref <500)
ALPHAHYDROXYMIDAZOLAM: NEGATIVE ng/mL (ref <50)
AMPHETAMINES: NEGATIVE ng/mL (ref <500)
Alphahydroxyalprazolam: 28 ng/mL — ABNORMAL HIGH (ref <25)
Alphahydroxytriazolam: NEGATIVE ng/mL (ref <50)
Aminoclonazepam: NEGATIVE ng/mL (ref <25)
Benzodiazepines: POSITIVE ng/mL — AB (ref <100)
Buprenorphine, Urine: NEGATIVE ng/mL (ref <5)
Cocaine Metabolite: NEGATIVE ng/mL (ref <150)
Creatinine: 87.4 mg/dL
HYDROXYETHYLFLURAZEPAM: NEGATIVE ng/mL (ref <50)
Lorazepam: NEGATIVE ng/mL (ref <50)
MARIJUANA METABOLITE: NEGATIVE ng/mL (ref <20)
MDA: NEGATIVE ng/mL (ref <200)
MDMA: NEGATIVE ng/mL (ref <200)
MDMA: NEGATIVE ng/mL (ref <500)
NORDIAZEPAM: NEGATIVE ng/mL (ref <50)
OPIATES: NEGATIVE ng/mL (ref <100)
OXIDANT: NEGATIVE ug/mL (ref <200)
OXYCODONE: NEGATIVE ng/mL (ref <100)
Oxazepam: NEGATIVE ng/mL (ref <50)
Temazepam: NEGATIVE ng/mL (ref <50)
pH: 6.4 (ref 4.5–9.0)

## 2017-07-04 ENCOUNTER — Encounter: Payer: Self-pay | Admitting: Physician Assistant

## 2017-07-04 ENCOUNTER — Other Ambulatory Visit: Payer: Self-pay

## 2017-07-04 ENCOUNTER — Ambulatory Visit (INDEPENDENT_AMBULATORY_CARE_PROVIDER_SITE_OTHER): Payer: Federal, State, Local not specified - PPO | Admitting: Physician Assistant

## 2017-07-04 VITALS — BP 116/70 | HR 84 | Temp 97.9°F | Resp 16 | Ht 72.0 in | Wt 225.0 lb

## 2017-07-04 DIAGNOSIS — E291 Testicular hypofunction: Secondary | ICD-10-CM | POA: Diagnosis not present

## 2017-07-04 DIAGNOSIS — Z125 Encounter for screening for malignant neoplasm of prostate: Secondary | ICD-10-CM | POA: Diagnosis not present

## 2017-07-04 DIAGNOSIS — E785 Hyperlipidemia, unspecified: Secondary | ICD-10-CM

## 2017-07-04 DIAGNOSIS — I10 Essential (primary) hypertension: Secondary | ICD-10-CM | POA: Diagnosis not present

## 2017-07-04 DIAGNOSIS — Z0000 Encounter for general adult medical examination without abnormal findings: Secondary | ICD-10-CM | POA: Diagnosis not present

## 2017-07-04 DIAGNOSIS — E119 Type 2 diabetes mellitus without complications: Secondary | ICD-10-CM

## 2017-07-04 DIAGNOSIS — E669 Obesity, unspecified: Secondary | ICD-10-CM

## 2017-07-04 DIAGNOSIS — Z6831 Body mass index (BMI) 31.0-31.9, adult: Secondary | ICD-10-CM

## 2017-07-04 MED ORDER — EMPAGLIFLOZIN-METFORMIN HCL ER 5-1000 MG PO TB24: 2.0000 | ORAL_TABLET | Freq: Every day | ORAL | 0 refills | Status: DC

## 2017-07-04 MED FILL — SYNJARDY XR 5-1,000 MG TAB: 5-1000 | 14 days supply | Qty: 28 | Fill #0

## 2017-07-04 NOTE — Assessment & Plan Note (Signed)
BP normotensive. Asymptomatic. Continue current regimen.  

## 2017-07-04 NOTE — Assessment & Plan Note (Signed)
With mild retinopathy and neuropathy. No history of nephropathy. Patient is on ACEI. BP well-controlled. Eye examination up-to-date. Foot exam updated today. No abnormal findings. Immunizations are up-to-date. Is taking Metformin and Basaglar. Fasting sugars still in low-mid 200s. Will switch to Synjardy XR 06-998 mg take 2 tablets daily. Continue Basaglar. Repeat labs. Will schedule follow-up based on labs.

## 2017-07-04 NOTE — Patient Instructions (Signed)
Please go to the lab for blood work.   Our office will call you with your results unless you have chosen to receive results via MyChart   Please continue medications as directed with the following exception: - Stop the metformin and Start the [place] using the voucher for the free trial of medication.   Call me next week to let me know how you are tolerating and we will send in the full dose of medication. This will also give [place] time to work on an authorization for the medication if needed.   If PSA levels look good, we will restart testosterone therapy.   Follow-up will be based on lab results.    Preventive Care 40-64 Years, Male Preventive care refers to lifestyle choices and visits with your health care provider that can promote health and wellness. What does preventive care include?  A yearly physical exam. This is also called an annual well check.  Dental exams once or twice a year.  Routine eye exams. Ask your health care provider how often you should have your eyes checked.  Personal lifestyle choices, including: ? Daily care of your teeth and gums. ? Regular physical activity. ? Eating a healthy diet. ? Avoiding tobacco and drug use. ? Limiting alcohol use. ? Practicing safe sex. ? Taking low-dose aspirin every day starting at age 53. What happens during an annual well check? The services and screenings done by your health care provider during your annual well check will depend on your age, overall health, lifestyle risk factors, and family history of disease. Counseling Your health care provider may ask you questions about your:  Alcohol use.  Tobacco use.  Drug use.  Emotional well-being.  Home and relationship well-being.  Sexual activity.  Eating habits.  Work and work Statistician.  Screening You may have the following tests or measurements:  Height, weight, and BMI.  Blood pressure.  Lipid and cholesterol levels. These may be checked every 5  years, or more frequently if you are over 68 years old.  Skin check.  Lung cancer screening. You may have this screening every year starting at age 86 if you have a 30-pack-year history of smoking and currently smoke or have quit within the past 15 years.  Fecal occult blood test (FOBT) of the stool. You may have this test every year starting at age 46.  Flexible sigmoidoscopy or colonoscopy. You may have a sigmoidoscopy every 5 years or a colonoscopy every 10 years starting at age 61.  Prostate cancer screening. Recommendations will vary depending on your family history and other risks.  Hepatitis C blood test.  Hepatitis B blood test.  Sexually transmitted disease (STD) testing.  Diabetes screening. This is done by checking your blood sugar (glucose) after you have not eaten for a while (fasting). You may have this done every 1-3 years.  Discuss your test results, treatment options, and if necessary, the need for more tests with your health care provider. Vaccines Your health care provider may recommend certain vaccines, such as:  Influenza vaccine. This is recommended every year.  Tetanus, diphtheria, and acellular pertussis (Tdap, Td) vaccine. You may need a Td booster every 10 years.  Varicella vaccine. You may need this if you have not been vaccinated.  Zoster vaccine. You may need this after age 106.  Measles, mumps, and rubella (MMR) vaccine. You may need at least one dose of MMR if you were born in 1957 or later. You may also need a second dose.  Pneumococcal 13-valent conjugate (PCV13) vaccine. You may need this if you have certain conditions and have not been vaccinated.  Pneumococcal polysaccharide (PPSV23) vaccine. You may need one or two doses if you smoke cigarettes or if you have certain conditions.  Meningococcal vaccine. You may need this if you have certain conditions.  Hepatitis A vaccine. You may need this if you have certain conditions or if you travel or  work in places where you may be exposed to hepatitis A.  Hepatitis B vaccine. You may need this if you have certain conditions or if you travel or work in places where you may be exposed to hepatitis B.  Haemophilus influenzae type b (Hib) vaccine. You may need this if you have certain risk factors.  Talk to your health care provider about which screenings and vaccines you need and how often you need them. This information is not intended to replace advice given to you by your health care provider. Make sure you discuss any questions you have with your health care provider. Document Released: 02/17/2015 Document Revised: 10/11/2015 Document Reviewed: 11/22/2014 [employer]vier Interactive Patient Education  [employer].

## 2017-07-04 NOTE — Progress Notes (Signed)
Patient presents to clinic today for annual exam.  Patient is fasting for labs.  Acute Concerns: Denies acute concerns today. Does want to briefly discuss recent testosterone levels and restarting medication.  Chronic Issues: Diabetes Mellitus II -- Uncontrolled. Patient previously non-adherent with recommendations. Is currently on Metformin 1000 mg BID and taking as directed. Is also using 80 units of Basaglar daily. Is checking fasting sugar on occasion. Notes these are averaging in the low-mid 200s. Denies vision changed presently but was noted to have mild diabetic retinopathy on recent eye exam in January of this year. Foot exam and immunizations are up-to-date. Is not always watching what he eats.  Lab Results  Component Value Date   HGBA1C 8.9 (H) 04/04/2017    Lab Results  Component Value Date   CHOL 151 04/04/2017   HDL 25.50 (L) 04/04/2017   LDLDIRECT 92.0 04/04/2017   TRIG 242.0 (H) 04/04/2017   CHOLHDL 6 04/04/2017   Health Maintenance: Immunizations -- up-to-date. Colonoscopy -- up-to-date. Last 12/15/217. + polyps. Recall 5 years.   Past Medical History:  Diagnosis Date  . Adjustment disorder with mixed anxiety and depressed mood   . Arthritis   . Asthma    history  . Diabetes mellitus    type 2  . Elevated blood pressure    history of high blood pressure readings  . History of chicken pox   . Hypercholesteremia   . Hypertension   . Internal hemorrhoids   . Tubular adenoma of colon     Past Surgical History:  Procedure Laterality Date  . ELBOW SURGERY     right elbow 2010, left elbow 08/06/10 [name])  . KNEE SURGERY     right knee new ACL- 1993,2000  . KNEE SURGERY     left knee 2003  . NASAL SEPTUM SURGERY     1992  . PARTIAL HIP ARTHROPLASTY     right hip replacement    Current Outpatient Medications on File Prior to Visit  Medication Sig Dispense Refill  . ALPRAZolam (XANAX) 1 MG tablet TAKE 1-2 TABLETS BY MOUTH DAILY AT BEDTIME  AS NEEDED FOR SLEEP OR ANXIETY 30 tablet 0  . atorvastatin (LIPITOR) 40 MG tablet TAKE 1 TABLET BY MOUTH DAILY 30 tablet 5  . BD ULTRA-FINE PEN NEEDLES 29G X 12.7MM MISC USE AS DIRECTED TO INJECT LANTUS 100 each 3  . Blood Glucose Monitoring Suppl (ONE TOUCH ULTRA SYSTEM KIT) W/DEVICE KIT 1 kit by Does not apply route once. Use to check blood sugar four times daily [Patient not checking daily]    . buPROPion (WELLBUTRIN XL) 300 MG 24 hr tablet TAKE 1 TABLET BY MOUTH DAILY. 90 tablet 1  . fenofibrate (TRICOR) 145 MG tablet TAKE 1 TABLET (145 MG TOTAL) BY MOUTH DAILY. 30 tablet 5  . fluticasone (FLONASE) 50 MCG/ACT nasal spray Place 2 sprays into both nostrils daily. 16 g 6  . glucose blood test strip Use as instructed 200 each 12  . Insulin Glargine (BASAGLAR KWIKPEN) 100 UNIT/ML SOPN Inject 1 mL (100 Units total) into the skin every morning. 90 mL 1  . lisinopril-hydrochlorothiazide (PRINZIDE,ZESTORETIC) 10-12.5 MG tablet TAKE 1 TABLET BY MOUTH DAILY. 90 tablet 1  . metFORMIN (GLUCOPHAGE) 1000 MG tablet TAKE ONE TABLET BY MOUTH 2 TIMES A DAY WITH A MEAL 180 tablet 1  . ONE TOUCH LANCETS MISC Use as directed 200 each 11  . sertraline (ZOLOFT) 100 MG tablet TAKE 1 TABLET BY MOUTH TWICE DAILY 180 tablet  1  . sildenafil (VIAGRA) 100 MG tablet Take 0.5-1 tablets (50-100 mg total) daily as needed by mouth for erectile dysfunction. 30 tablet 0   No current facility-administered medications on file prior to visit.     No Known Allergies  Family History  Problem Relation Age of Onset  . Diabetes Mother   . Thyroid disease Mother        Questionable  . Heart disease Father        deceased  . Other Neg Hx        hypogonadism  . Colon cancer Neg Hx     Social History   Socioeconomic History  . Marital status: Married    Spouse name: Not on file  . Number of children: Not on file  . Years of education: Not on file  . Highest education level: Not on file  Occupational History  . Not on  file  Social Needs  . Financial resource strain: Not on file  . Food insecurity:    Worry: Not on file    Inability: Not on file  . Transportation needs:    Medical: Not on file    Non-medical: Not on file  Tobacco Use  . Smoking status: Never Smoker  . Smokeless tobacco: Never Used  Substance and Sexual Activity  . Alcohol use: Yes    Alcohol/week: 4.2 - 8.4 oz    Types: 7 - 14 Glasses of wine per week  . Drug use: No  . Sexual activity: Not on file  Lifestyle  . Physical activity:    Days per week: Not on file    Minutes per session: Not on file  . Stress: Not on file  Relationships  . Social connections:    Talks on phone: Not on file    Gets together: Not on file    Attends religious service: Not on file    Active member of club or organization: Not on file    Attends meetings of clubs or organizations: Not on file    Relationship status: Not on file  . Intimate partner violence:    Fear of current or ex partner: Not on file    Emotionally abused: Not on file    Physically abused: Not on file    Forced sexual activity: Not on file  Other Topics Concern  . 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 Negative for fever and weight loss.  HENT: Negative for ear discharge, ear pain, hearing loss and tinnitus.   Eyes: Negative for blurred vision, double vision, photophobia and pain.  Respiratory: Negative for cough and shortness of breath.   Cardiovascular: Negative for chest pain and palpitations.  Gastrointestinal: Negative for abdominal pain, blood in stool, constipation, diarrhea, heartburn,  melena, nausea and vomiting.  Genitourinary: Negative for dysuria, flank pain, frequency, hematuria and urgency.  Musculoskeletal: Negative for falls.  Neurological: Negative for dizziness, loss of consciousness and headaches.  Endo/Heme/Allergies: Negative for environmental allergies.  Psychiatric/Behavioral: Negative  for depression, hallucinations, substance abuse and suicidal ideas. The patient is not nervous/anxious and does not have insomnia.    BP 116/70   Pulse 84   Temp 97.9 F (36.6 C) (Oral)   Resp 16   Ht 6' (1.829 m)   Wt 225 lb (102.1 kg)   SpO2 97%   BMI 30.52 kg/m   Physical Exam  Constitutional: He is oriented to person, place, and time. He appears well-developed and well-nourished. No distress.  HENT:  Head: Normocephalic and atraumatic.  Right Ear: Tympanic membrane, external ear and ear canal normal.  Left Ear: Tympanic membrane, external ear and ear canal normal.  Nose: Nose normal.  Mouth/Throat: Oropharynx is clear and moist and mucous membranes are normal. No posterior oropharyngeal edema or posterior oropharyngeal erythema.  Eyes: Pupils are equal, round, and reactive to light. Conjunctivae are normal.  Neck: Neck supple. No thyromegaly present.  Cardiovascular: Normal rate, regular rhythm, normal heart sounds and intact distal pulses.  Pulmonary/Chest: Effort normal and breath sounds normal. No respiratory distress. He has no wheezes. He has no rales. He exhibits no tenderness.  Abdominal: Soft. Bowel sounds are normal. He exhibits no distension and no mass. There is no tenderness. There is no rebound and no guarding.  Lymphadenopathy:    He has no cervical adenopathy.  Neurological: He is alert and oriented to person, place, and time. No cranial nerve deficit.  Skin: Skin is warm and dry. No rash noted. He is not diaphoretic.  Psychiatric: He has a normal mood and affect.  Vitals reviewed.  Recent Results (from the past 2160 hour(s))  Pain Mgmt,  Profile 8 w/Conf, U     Status: Abnormal   Collection Time: 06/23/17  9:41 AM  Result Value Ref Range   Creatinine 87.4 > or = 20. mg/dL   pH 6.40 4.5 - 9.0   Oxidant NEGATIVE <200 mcg/mL   Amphetamines NEGATIVE <500 ng/mL   medMATCH Amphetamines CONSISTENT    Benzodiazepines POSITIVE (A) <100 ng/mL   Alphahydroxyalprazolam 28 (H) <25 ng/mL    Comment: See Note 1   medMATCH aOH alprazolam INCONSISTENT    Alphahydroxymidazolam NEGATIVE <50 ng/mL    Comment: See Note 1   medMATCH aOH midazolam CONSISTENT    Alphahydroxytriazolam NEGATIVE <50 ng/mL    Comment: See Note 1   medMATCH aOH triazolam CONSISTENT    Aminoclonazepam NEGATIVE <25 ng/mL    Comment: See Note 1   medMATCH Aminoclonazepam CONSISTENT    Hydroxyethylflurazepam NEGATIVE <50 ng/mL    Comment: See Note 1   medMATCH OH,Et flurazepam CONSISTENT    Lorazepam NEGATIVE <50 ng/mL    Comment: See Note 1   medMATCH Lorazepam CONSISTENT    Nordiazepam NEGATIVE <50 ng/mL    Comment: See Note 1   medMATCH Nordiazepam CONSISTENT    Oxazepam NEGATIVE <50 ng/mL    Comment: See Note 1   medMATCH Oxazepam CONSISTENT    Temazepam NEGATIVE <50 ng/mL    Comment: See Note 1   medMATCH Temazepam CONSISTENT    Marijuana Metabolite NEGATIVE <20 ng/mL   medMATCH Marijuana Metab CONSISTENT    Cocaine Metabolite NEGATIVE <150 ng/mL   medMATCH Cocaine Metab CONSISTENT    Opiates NEGATIVE <100 ng/mL   medMATCH Opiates CONSISTENT    Oxycodone NEGATIVE <100 ng/mL   medMATCH Oxycodone CONSISTENT    Buprenorphine, Urine NEGATIVE <5 ng/mL   medMATCH Buprenorphine CONSISTENT    MDMA NEGATIVE CONFIRMED <500 ng/mL   MDA NEGATIVE <200 ng/mL  Comment: See Note 1   medMATCH MDA CONSISTENT    MDMA NEGATIVE <200 ng/mL    Comment: See Note 1   medMATCH MDMA CONSISTENT     Comment: Note 1 . This test was developed and its analytical performance  characteristics have been determined by [employer]. It has not been cleared or  approved by the FDA. This assay has been validated pursuant to the CLIA  regulations and is used for clinical purposes.    Alcohol Metabolites NEGATIVE <500 ng/mL   [facility] Alcohol Metab CONSISTENT    6 Acetylmorphine NEGATIVE <10 ng/mL   medMATCH 6 Acetylmorphine CONSISTENT     Comment: This drug testing is for medical treatment only.   Analysis was performed as non-forensic testing and  these results should be used only by healthcare  providers to render diagnosis or treatment, or to  monitor progress of medical conditions. Hazel Sams comments are:  - present when drug test results may be the result of     metabolism of one or more drugs or when results are     inconsistent with prescribed medication(s) listed.  - may be blank when drug results are consistent with     prescribed medication(s) listed. . For assistance with interpreting these drug results,  please contact a [employer] Toxicology  Specialist: [phone] [place] [phone]), M-F,  8am-6pm EST. This drug testing is for medical treatment only.   Analysis was performed as non-forensic testing and  these results should be used only by healthcare  providers to render diagnosis or treatment, or to  monitor progress of medical conditions. Hazel Sams comments are:  - present when  drug test results may be the result of     metabolism of one or more drugs or when results are     inconsistent with prescribed medication(s) listed.  - may be blank when drug results are consistent with     prescribed medication(s) listed. . For assistance with interpreting these drug results,  please contact a [employer] Toxicology  Specialist: [phone] [place] [phone]), M-F,  8am-6pm EST. This drug testing is for medical treatment only.   Analysis was performed as non-forensic testing and  these results should be used only by healthcare  providers to render diagnosis or treatment, or to  monitor progress of  medical conditions. Hazel Sams comments are:  - present when drug test results may be the result of     metabolism of one or more drugs or when results are     inconsistent with prescribed medication(s) listed.  - may be blank when drug results are consistent with     prescribed medication(s) listed. . For assistance with interpreting these d rug results,  please contact a [employer] Toxicology  Specialist: [phone] [place] [phone]), M-F,  8am-6pm EST. This drug testing is for medical treatment only.   Analysis was performed as non-forensic testing and  these results should be used only by healthcare  providers to render diagnosis or treatment, or to  monitor progress of medical conditions. Hazel Sams comments are:  - present when drug test results may be the result of     metabolism of one or more drugs or when results are     inconsistent with prescribed medication(s) listed.  - may be blank when drug results are consistent with     prescribed medication(s) listed. . For assistance with interpreting these drug results,  please contact a [employer] Toxicology  Specialist: [phone] [place] (  [phone]), M-F,  8am-6pm EST. This drug testing is for medical treatment only.   Analysis was performed as non-forensic testing and  these results should be used only by healthcare  provi ders to render diagnosis or treatment, or to  monitor progress of medical conditions. Hazel Sams comments are:  - present when drug test results may be the result of     metabolism of one or more drugs or when results are     inconsistent with prescribed medication(s) listed.  - may be blank when drug results are consistent with     prescribed medication(s) listed. . For assistance with interpreting these drug results,  please contact a [employer] Toxicology  Specialist: [phone] [place] [phone]), M-F,  8am-6pm EST.    Assessment/Plan: Visit for  preventive health examination Depression screen negative. Health Maintenance reviewed -- Immunizations and colonoscopy up-to-date. Preventive schedule discussed and handout given in AVS. Will obtain fasting labs today.   Prostate cancer screening The natural history of prostate cancer and ongoing controversy regarding screening and potential treatment outcomes of prostate cancer has been discussed with the patient. The meaning of a false positive PSA and a false negative PSA has been discussed. He indicates understanding of the limitations of this screening test and wishes to proceed with screening PSA testing.   Hyperlipemia Taking statin medication as directed. Tolerating well. LDL at goal on check 3 months ago.   Class 1 obesity with serious comorbidity and body mass index (BMI) of 31.0 to 31.9 in adult Body mass index is 30.52 kg/m. Will continue working harder on diet and exercise. WE are starting Synjardy XR which may also help with weight.  Will monitor.    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 No history of nephropathy. Patient is on ACEI. BP well-controlled. Eye examination up-to-date. Foot exam updated today. No abnormal findings. Immunizations are up-to-date. Is taking Metformin and Basaglar. Fasting sugars still in low-mid 200s. Will switch to Synjardy XR 06-998 mg take 2 tablets daily. Continue Basaglar. Repeat labs. Will schedule follow-up based on labs.   Hypogonadism in male Recent testosterone levels confirmed diagnosis. Will check PSA today. If stable, will restart Depo-testosterone injections. He is aware that if we restart this medication, routine lab monitoring and surveillance will be needed starting at 2-3 months after initiation of medication.  Essential hypertension BP normotensive. Asymptomatic. Continue current regimen.     Leeanne Rio, PA-C

## 2017-07-04 NOTE — Assessment & Plan Note (Signed)
The natural history of prostate cancer and ongoing controversy regarding screening and potential treatment outcomes of prostate cancer has been discussed with the patient. The meaning of a false positive PSA and a false negative PSA has been discussed. He indicates understanding of the limitations of this screening test and wishes  to proceed with screening PSA testing.  

## 2017-07-04 NOTE — Assessment & Plan Note (Signed)
Depression screen negative. Health Maintenance reviewed -- Immunizations and colonoscopy up-to-date. Preventive schedule discussed and handout given in AVS. Will obtain fasting labs today.

## 2017-07-04 NOTE — Assessment & Plan Note (Deleted)
Body mass index is 30.52 kg/m. Will continue working harder on diet and exercise. WE are starting Synjardy XR which may also help with weight.  Will monitor.

## 2017-07-04 NOTE — Assessment & Plan Note (Signed)
Taking statin medication as directed. Tolerating well. LDL at goal on check 3 months ago.

## 2017-07-04 NOTE — Assessment & Plan Note (Signed)
Body mass index is 30.52 kg/m. Will continue working harder on diet and exercise. WE are starting Synjardy XR which may also help with weight.  Will monitor.

## 2017-07-04 NOTE — Assessment & Plan Note (Signed)
Recent testosterone levels confirmed diagnosis. Will check PSA today. If stable, will restart Depo-testosterone injections. He is aware that if we restart this medication, routine lab monitoring and surveillance will be needed starting at 2-3 months after initiation of medication.

## 2017-07-05 LAB — HEMOGLOBIN A1C
EAG (MMOL/L): 13.6 (calc)
HEMOGLOBIN A1C: 10.2 %{Hb} — AB (ref <5.7)
MEAN PLASMA GLUCOSE: 246 (calc)

## 2017-07-05 LAB — PSA: PSA: 0.3 ng/mL (ref <=4.0)

## 2017-07-07 ENCOUNTER — Other Ambulatory Visit: Payer: Self-pay | Admitting: Physician Assistant

## 2017-07-07 ENCOUNTER — Encounter: Payer: Self-pay | Admitting: Physician Assistant

## 2017-07-07 MED ORDER — ALPRAZOLAM 1 MG PO TABS: ORAL_TABLET | ORAL | 0 refills | Status: DC

## 2017-07-07 MED FILL — ALPRAZolam 1 MG TABS: 1 | 30 days supply | Qty: 60 | Fill #0

## 2017-07-08 MED ORDER — TESTOSTERONE CYPIONATE 200 MG/ML IM SOLN: 50.0000 mg | INTRAMUSCULAR | 3 refills | Status: DC

## 2017-07-08 MED FILL — TESTOSTERONE CYP 200 MG/ML: 200 | 28 days supply | Qty: 1 | Fill #0

## 2017-07-09 ENCOUNTER — Telehealth: Payer: Self-pay | Admitting: Emergency Medicine

## 2017-07-09 NOTE — Telephone Encounter (Signed)
Prior authorization BCBS for Testosterone Cypionate 200mg  IM injection approved from 06/09/17-11/05/17. Patient pharmacy notified.

## 2017-07-17 ENCOUNTER — Other Ambulatory Visit: Payer: Self-pay | Admitting: Physician Assistant

## 2017-07-17 DIAGNOSIS — E119 Type 2 diabetes mellitus without complications: Secondary | ICD-10-CM

## 2017-07-18 ENCOUNTER — Encounter: Payer: Self-pay | Admitting: Emergency Medicine

## 2017-07-18 NOTE — Telephone Encounter (Signed)
Last OV 07/04/17 Alprazolam last filled 07/07/17 #60 with 0

## 2017-07-19 ENCOUNTER — Encounter: Payer: Self-pay | Admitting: Physician Assistant

## 2017-07-21 ENCOUNTER — Other Ambulatory Visit: Payer: Self-pay | Admitting: Emergency Medicine

## 2017-07-21 DIAGNOSIS — E119 Type 2 diabetes mellitus without complications: Secondary | ICD-10-CM

## 2017-07-21 MED ORDER — EMPAGLIFLOZIN-METFORMIN HCL ER 5-1000 MG PO TB24: 2.0000 | ORAL_TABLET | Freq: Every day | ORAL | 5 refills | Status: DC

## 2017-07-21 MED ORDER — TESTOSTERONE CYPIONATE 200 MG/ML IM SOLN: 100.0000 mg | INTRAMUSCULAR | 3 refills | Status: DC

## 2017-07-21 MED ORDER — ALPRAZOLAM 1 MG PO TABS: ORAL_TABLET | ORAL | 0 refills | Status: DC

## 2017-07-21 MED FILL — DEPO-TESTOSTERONE 200 MG/ML: 200 | 28 days supply | Qty: 2 | Fill #0

## 2017-07-21 NOTE — Addendum Note (Signed)
Addended by: Raiford Noble C on: 07/21/2017 11:08 AM   Modules accepted: Orders

## 2017-07-25 MED FILL — SYNJARDY XR 5-1,000 MG TAB: 5-1000 | 30 days supply | Qty: 60 | Fill #0

## 2017-07-28 ENCOUNTER — Other Ambulatory Visit: Payer: Self-pay | Admitting: Physician Assistant

## 2017-07-28 DIAGNOSIS — S01112A Laceration without foreign body of left eyelid and periocular area, initial encounter: Secondary | ICD-10-CM | POA: Diagnosis not present

## 2017-07-28 DIAGNOSIS — H1132 Conjunctival hemorrhage, left eye: Secondary | ICD-10-CM | POA: Diagnosis not present

## 2017-07-28 MED FILL — ERYTHROMYCIN 0.5% EYE OINT: 5 | 5 days supply | Qty: 4 | Fill #0

## 2017-07-28 MED FILL — SSD 1% CREAM: 1 | 20 days supply | Qty: 50 | Fill #0

## 2017-07-31 MED FILL — ALPRAZolam 1 MG TABS: 1 | 30 days supply | Qty: 60 | Fill #0

## 2017-08-12 ENCOUNTER — Encounter: Payer: Self-pay | Admitting: Emergency Medicine

## 2017-08-12 DIAGNOSIS — E113211 Type 2 diabetes mellitus with mild nonproliferative diabetic retinopathy with macular edema, right eye: Secondary | ICD-10-CM | POA: Insufficient documentation

## 2017-08-18 MED FILL — CHLORHEXIDINE 0.12% RINSE: 0.12 | 15 days supply | Qty: 473 | Fill #0

## 2017-08-18 MED FILL — AMOXICILLIN 500 MG CAPSULE: 500 | 7 days supply | Qty: 21 | Fill #0

## 2017-08-18 MED FILL — ACETAMINOPHEN/COD #3 TABLET: 300-30 | 2 days supply | Qty: 10 | Fill #0

## 2017-08-18 MED FILL — IBUPROFEN 800 MG TAB: 800 | 5 days supply | Qty: 20 | Fill #0

## 2017-08-20 MED FILL — PREVIDENT 5000 1.1% DRY MOU: 1.1 | 30 days supply | Qty: 100 | Fill #1

## 2017-08-20 MED FILL — ATORVASTATIN 40 MG TABLET: 40 | 30 days supply | Qty: 30 | Fill #2

## 2017-08-20 MED FILL — SERTRALINE HCL 100 MG TAB: 100 | 90 days supply | Qty: 180 | Fill #0

## 2017-08-20 MED FILL — SYNJARDY XR 5-1,000 MG TAB: 5-1000 | 30 days supply | Qty: 60 | Fill #1

## 2017-08-20 MED FILL — buPROPion HCL ER (XL) 300 M: 300 | 90 days supply | Qty: 90 | Fill #0

## 2017-08-20 MED FILL — LISINOPRIL-HCTZ 10-12.5 TAB: 10-12.5 | 90 days supply | Qty: 90 | Fill #0

## 2017-08-20 MED FILL — FENOFIBRATE 145 MG TAB: 145 | 30 days supply | Qty: 30 | Fill #2

## 2017-08-20 MED FILL — TESTOSTERONE CYP 200 MG/ML: 200 | 28 days supply | Qty: 2 | Fill #1

## 2017-08-26 MED FILL — IBUPROFEN 800 MG TAB: 800 | 6 days supply | Qty: 20 | Fill #0

## 2017-09-01 DIAGNOSIS — E113292 Type 2 diabetes mellitus with mild nonproliferative diabetic retinopathy without macular edema, left eye: Secondary | ICD-10-CM | POA: Diagnosis not present

## 2017-09-01 DIAGNOSIS — H35362 Drusen (degenerative) of macula, left eye: Secondary | ICD-10-CM | POA: Diagnosis not present

## 2017-09-01 DIAGNOSIS — H43813 Vitreous degeneration, bilateral: Secondary | ICD-10-CM | POA: Diagnosis not present

## 2017-09-01 DIAGNOSIS — E113211 Type 2 diabetes mellitus with mild nonproliferative diabetic retinopathy with macular edema, right eye: Secondary | ICD-10-CM | POA: Diagnosis not present

## 2017-09-01 LAB — HM DIABETES EYE EXAM

## 2017-09-03 ENCOUNTER — Other Ambulatory Visit: Payer: Self-pay | Admitting: Physician Assistant

## 2017-09-03 MED FILL — CHLORHEXIDINE 0.12% RINSE: 0.12 | 15 days supply | Qty: 473 | Fill #1

## 2017-09-03 MED FILL — ALPRAZolam 1 MG TABS: 1 | 30 days supply | Qty: 60 | Fill #0

## 2017-09-03 NOTE — Telephone Encounter (Signed)
Xanax refilled on 07/21/17 #60 CSC: 06/23/17 UDS: 06/23/17 Last OV: 07/04/17  Please advise

## 2017-09-09 ENCOUNTER — Encounter: Payer: Self-pay | Admitting: Physician Assistant

## 2017-09-10 MED ORDER — SILDENAFIL CITRATE 100 MG PO TABS: 50.0000 mg | ORAL_TABLET | Freq: Every day | ORAL | 5 refills | Status: DC | PRN

## 2017-09-18 MED FILL — TESTOSTERONE CYP 200 MG/ML: 200 | 28 days supply | Qty: 2 | Fill #2

## 2017-09-19 ENCOUNTER — Encounter: Payer: Self-pay | Admitting: Physician Assistant

## 2017-10-03 ENCOUNTER — Encounter: Payer: Self-pay | Admitting: Emergency Medicine

## 2017-10-06 ENCOUNTER — Other Ambulatory Visit: Payer: Self-pay | Admitting: Physician Assistant

## 2017-10-07 ENCOUNTER — Encounter: Payer: Self-pay | Admitting: Physician Assistant

## 2017-10-07 MED ORDER — ALPRAZOLAM 1 MG PO TABS: ORAL_TABLET | ORAL | 0 refills | Status: DC

## 2017-10-07 MED FILL — ATORVASTATIN 40 MG TABLET: 40 | 30 days supply | Qty: 30 | Fill #3

## 2017-10-07 MED FILL — ALPRAZolam 1 MG TABS: 1 | 30 days supply | Qty: 60 | Fill #0

## 2017-10-07 MED FILL — FENOFIBRATE 145 MG TAB: 145 | 30 days supply | Qty: 30 | Fill #3

## 2017-10-07 MED FILL — SYNJARDY XR 5-1,000 MG TAB: 5-1000 | 30 days supply | Qty: 60 | Fill #2

## 2017-10-07 NOTE — Telephone Encounter (Signed)
Last OV 07/04/17 Alprazolam last filled 09/03/17 #60 with 0

## 2017-10-09 DIAGNOSIS — K080 Exfoliation of teeth due to systemic causes: Secondary | ICD-10-CM | POA: Diagnosis not present

## 2017-10-10 ENCOUNTER — Other Ambulatory Visit: Payer: Self-pay | Admitting: Physician Assistant

## 2017-10-13 MED FILL — TESTOSTERONE CYP 200 MG/ML: 200 | 28 days supply | Qty: 2 | Fill #3

## 2017-10-27 ENCOUNTER — Encounter: Payer: Self-pay | Admitting: Physician Assistant

## 2017-10-31 ENCOUNTER — Other Ambulatory Visit: Payer: Self-pay

## 2017-10-31 ENCOUNTER — Ambulatory Visit: Payer: Federal, State, Local not specified - PPO | Admitting: Physician Assistant

## 2017-10-31 ENCOUNTER — Encounter: Payer: Self-pay | Admitting: Physician Assistant

## 2017-10-31 VITALS — BP 124/64 | HR 86 | Temp 98.0°F | Resp 14 | Ht 72.0 in | Wt 225.0 lb

## 2017-10-31 DIAGNOSIS — I10 Essential (primary) hypertension: Secondary | ICD-10-CM

## 2017-10-31 DIAGNOSIS — E119 Type 2 diabetes mellitus without complications: Secondary | ICD-10-CM | POA: Diagnosis not present

## 2017-10-31 DIAGNOSIS — Z23 Encounter for immunization: Secondary | ICD-10-CM | POA: Diagnosis not present

## 2017-10-31 DIAGNOSIS — E291 Testicular hypofunction: Secondary | ICD-10-CM | POA: Diagnosis not present

## 2017-10-31 LAB — MICROALBUMIN / CREATININE URINE RATIO
Creatinine,U: 57.4 mg/dL
Microalb Creat Ratio: 1.2 mg/g (ref 0.0–30.0)

## 2017-10-31 LAB — TESTOSTERONE: TESTOSTERONE: 561.61 ng/dL (ref 300.00–890.00)

## 2017-10-31 LAB — LIPID PANEL
CHOL/HDL RATIO: 6
CHOLESTEROL: 162 mg/dL (ref 0–200)
HDL: 26.1 mg/dL — ABNORMAL LOW (ref >39.00)
NONHDL: 135.55
Triglycerides: 297 mg/dL — ABNORMAL HIGH (ref 0.0–149.0)
VLDL: 59.4 mg/dL — AB (ref 0.0–40.0)

## 2017-10-31 LAB — COMPREHENSIVE METABOLIC PANEL
ALBUMIN: 4.4 g/dL (ref 3.5–5.2)
ALT: 24 U/L (ref 0–53)
AST: 18 U/L (ref 0–37)
Alkaline Phosphatase: 68 U/L (ref 39–117)
BILIRUBIN TOTAL: 0.3 mg/dL (ref 0.2–1.2)
BUN: 20 mg/dL (ref 6–23)
CHLORIDE: 102 meq/L (ref 96–112)
CO2: 26 mEq/L (ref 19–32)
CREATININE: 1.04 mg/dL (ref 0.40–1.50)
Calcium: 9.4 mg/dL (ref 8.4–10.5)
GFR: 77.59 mL/min (ref >60.00)
Glucose, Bld: 206 mg/dL — ABNORMAL HIGH (ref 70–99)
Potassium: 4.1 mEq/L (ref 3.5–5.1)
SODIUM: 136 meq/L (ref 135–145)
TOTAL PROTEIN: 7 g/dL (ref 6.0–8.3)

## 2017-10-31 LAB — CBC WITH DIFFERENTIAL/PLATELET
Basophils Absolute: 0 10*3/uL (ref 0.0–0.1)
Basophils Relative: 0.8 % (ref 0.0–3.0)
Eosinophils Absolute: 0.1 10*3/uL (ref 0.0–0.7)
Eosinophils Relative: 2.3 % (ref 0.0–5.0)
HCT: 42.4 % (ref 39.0–52.0)
Hemoglobin: 14.3 g/dL (ref 13.0–17.0)
Lymphocytes Relative: 30.2 % (ref 12.0–46.0)
Lymphs Abs: 1.8 10*3/uL (ref 0.7–4.0)
MCHC: 33.7 g/dL (ref 30.0–36.0)
MCV: 82 fl (ref 78.0–100.0)
Monocytes Absolute: 0.4 10*3/uL (ref 0.1–1.0)
Monocytes Relative: 7.7 % (ref 3.0–12.0)
Neutro Abs: 3.4 10*3/uL (ref 1.4–7.7)
Neutrophils Relative %: 59 % (ref 43.0–77.0)
Platelets: 179 10*3/uL (ref 150.0–400.0)
RBC: 5.18 Mil/uL (ref 4.22–5.81)
RDW: 14.8 % (ref 11.5–15.5)
WBC: 5.8 10*3/uL (ref 4.0–10.5)

## 2017-10-31 LAB — PSA: PSA: 0.38 ng/mL (ref 0.10–4.00)

## 2017-10-31 LAB — HEMOGLOBIN A1C: Hgb A1c MFr Bld: 8.5 % — ABNORMAL HIGH (ref 4.6–6.5)

## 2017-10-31 LAB — LDL CHOLESTEROL, DIRECT: Direct LDL: 94 mg/dL

## 2017-10-31 MED ORDER — BASAGLAR KWIKPEN 100 UNIT/ML ~~LOC~~ SOPN: 100.0000 [IU] | PEN_INJECTOR | SUBCUTANEOUS | 1 refills | Status: DC

## 2017-10-31 MED FILL — BASAGLAR 100 UNIT/ML KWIKPE: 100 | 15 days supply | Qty: 15 | Fill #0

## 2017-10-31 NOTE — Assessment & Plan Note (Signed)
Flu shot updated today. 

## 2017-10-31 NOTE — Assessment & Plan Note (Signed)
BP looks fantastic. Continue current regimen.

## 2017-10-31 NOTE — Assessment & Plan Note (Signed)
Taking medications as directed. Fasting CBG above goal. Will check renal function and add on potential Ozempic or other GLP1 agonist to current regimen. Will check fasting A1C, lipids and CMP today. Urine microalbumin also ordered. Foot exam, eye exam and immunizations are up-to-date.

## 2017-10-31 NOTE — Progress Notes (Signed)
History of Present Illness: Patient is a 57 y.o. male who presents to clinic today for follow-up of Diabetes Mellitus II, previously uncontrolled with retinopathy. Patient currently on medication regimen of Basaglar 85 units AM and 80 units PM and Synjardy 11-1998 mg daily divided in two doses.  Endorses taking medications as directed. Endorses trying to keep a well-balanced diet. Endorses checking blood glucose as directed. Notes fasting glucose averaging upper 100s-low 200s (on occasion)  Latest Maintenance: A1C --  Lab Results  Component Value Date   HGBA1C 10.2 (H) 07/04/2017   Diabetic Eye Exam -- Up-to-date. + Retinopathy.  Foot Exam -- up-to-date. Declines concerns today.  Past Medical History:  Diagnosis Date  . Adjustment disorder with mixed anxiety and depressed mood   . Arthritis   . Asthma    history  . Diabetes mellitus    type 2  . Elevated blood pressure    history of high blood pressure readings  . History of chicken pox   . Hypercholesteremia   . Hypertension   . Internal hemorrhoids   . Tubular adenoma of colon     Current Outpatient Medications on File Prior to Visit  Medication Sig Dispense Refill  . ALPRAZolam (XANAX) 1 MG tablet TAKE 1-2 TABLETS BY MOUTH DAILY AT BEDTIME AS NEEDED FOR SLEEP OR ANXIETY 60 tablet 0  . atorvastatin (LIPITOR) 40 MG tablet TAKE 1 TABLET BY MOUTH DAILY 30 tablet 5  . BD ULTRA-FINE PEN NEEDLES 29G X 12.7MM MISC USE AS DIRECTED TO INJECT LANTUS 100 each 3  . Blood Glucose Monitoring Suppl (ONE TOUCH ULTRA SYSTEM KIT) W/DEVICE KIT 1 kit by Does not apply route once. Use to check blood sugar four times daily [Patient not checking daily]    . buPROPion (WELLBUTRIN XL) 300 MG 24 hr tablet TAKE 1 TABLET BY MOUTH DAILY. 90 tablet 1  . Empagliflozin-metFORMIN HCl ER (SYNJARDY XR) 06-998 MG TB24 Take 2 tablets by mouth daily. 60 tablet 5  . fenofibrate (TRICOR) 145 MG tablet TAKE 1 TABLET (145 MG TOTAL) BY MOUTH DAILY. 30 tablet 5  .  fluticasone (FLONASE) 50 MCG/ACT nasal spray Place 2 sprays into both nostrils daily. 16 g 6  . glucose blood test strip Use as instructed 200 each 12  . lisinopril-hydrochlorothiazide (PRINZIDE,ZESTORETIC) 10-12.5 MG tablet TAKE 1 TABLET BY MOUTH DAILY. 90 tablet 1  . ONE TOUCH LANCETS MISC Use as directed 200 each 11  . sertraline (ZOLOFT) 100 MG tablet TAKE 1 TABLET BY MOUTH TWICE DAILY 180 tablet 1  . sildenafil (VIAGRA) 100 MG tablet Take 0.5-1 tablets (50-100 mg total) by mouth daily as needed for erectile dysfunction. 30 tablet 5  . SSD 1 % cream APPLY 1 APPLICATION TOPICALLY ONCE DAILY AS NEEDED 50 g 0  . testosterone cypionate (DEPOTESTOSTERONE CYPIONATE) 200 MG/ML injection Inject 0.5 mLs (100 mg total) into the muscle once a week. 2 mL 3   No current facility-administered medications on file prior to visit.     No Known Allergies  Family History  Problem Relation Age of Onset  . Diabetes Mother   . Thyroid disease Mother        Questionable  . Heart disease Father        deceased  . Other Neg Hx        hypogonadism  . Colon cancer Neg Hx     Social History   Socioeconomic History  . Marital status: Married    Spouse name: Not on file  .  Number of children: Not on file  . Years of education: Not on file  . Highest education level: Not on file  Occupational History  . Not on file  Social Needs  . Financial resource strain: Not on file  . Food insecurity:    Worry: Not on file    Inability: Not on file  . Transportation needs:    Medical: Not on file    Non-medical: Not on file  Tobacco Use  . Smoking status: Never Smoker  . Smokeless tobacco: Never Used  Substance and Sexual Activity  . Alcohol use: Yes    Alcohol/week: 7.0 - 14.0 standard drinks    Types: 7 - 14 Glasses of wine per week  . Drug use: No  . Sexual activity: Not on file  Lifestyle  . Physical activity:    Days per week: Not on file    Minutes per session: Not on file  . Stress: Not on  file  Relationships  . Social connections:    Talks on phone: Not on file    Gets together: Not on file    Attends religious service: Not on file    Active member of club or organization: Not on file    Attends meetings of clubs or organizations: Not on file    Relationship status: Not on file  Other Topics Concern  . 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   Married -6 marriage (2nd marriage)   daughter 87,  2 sons (2nd marriage  11,6)   [place]   Never Smoked    Alcohol use-yes   Drug use-no    Regular exercise-no   Smoking Status:  never   Does Patient Exercise:  no   Caffeine use/day:  3-4 cups coffee daily   Drug Use:  no         Review of Systems: Pertinent ROS are listed in HPI  Physical Examination: BP 124/64   Pulse 86   Temp 98 F (36.7 C) (Oral)   Resp 14   Ht 6' (1.829 m)   Wt 225 lb (102.1 kg)   SpO2 98%   BMI 30.52 kg/m  General appearance: alert, cooperative, appears stated age and no distress Ears: normal TM's and external ear canals both ears Nose: Nares normal. Septum midline. Mucosa normal. No drainage or sinus tenderness. Throat: lips, mucosa, and tongue normal; teeth and gums normal Lungs: clear to auscultation bilaterally Heart: regular rate and rhythm, S1, S2 normal, no murmur, click, rub or gallop  Assessment/Plan: Hypogonadism in male Energy levels much improved. Will repeat testosterone and PSA levels today.  Essential hypertension BP looks fantastic. Continue current regimen.  Need for immunization against influenza Flu shot updated today  Diabetes mellitus Taking medications as directed. Fasting CBG above goal. Will check renal function and add on potential Ozempic or other GLP1 agonist to current regimen. Will check fasting A1C, lipids and CMP today. Urine microalbumin also ordered. Foot exam, eye exam and immunizations are up-to-date.

## 2017-10-31 NOTE — Patient Instructions (Signed)
Please go to the lab today for blood work.  I will call you with your results. We will alter treatment regimen(s) if indicated by your results.   I will check on which once-weekly injectable your insurance covers. This will be a good option to start.   Diabetes Mellitus and Exercise Exercising regularly is important for your overall health, especially when you have diabetes (diabetes mellitus). Exercising is not only about losing weight. It has many health benefits, such as increasing muscle strength and bone density and reducing body fat and stress. This leads to improved fitness, flexibility, and endurance, all of which result in better overall health. Exercise has additional benefits for people with diabetes, including:  Reducing appetite.  Helping to lower and control blood glucose.  Lowering blood pressure.  Helping to control amounts of fatty substances (lipids) in the blood, such as cholesterol and triglycerides.  Helping the body to respond better to insulin (improving insulin sensitivity).  Reducing how much insulin the body needs.  Decreasing the risk for heart disease by: ? Lowering cholesterol and triglyceride levels. ? Increasing the levels of good cholesterol. ? Lowering blood glucose levels.  What is my activity plan? Your health care provider or certified diabetes educator can help you make a plan for the type and frequency of exercise (activity plan) that works for you. Make sure that you:  Do at least 150 minutes of moderate-intensity or vigorous-intensity exercise each week. This could be brisk walking, biking, or water aerobics. ? Do stretching and strength exercises, such as yoga or weightlifting, at least 2 times a week. ? Spread out your activity over at least 3 days of the week.  Get some form of physical activity every day. ? Do not go more than 2 days in a row without some kind of physical activity. ? Avoid being inactive for more than 90 minutes at a  time. Take frequent breaks to walk or stretch.  Choose a type of exercise or activity that you enjoy, and set realistic goals.  Start slowly, and gradually increase the intensity of your exercise over time.  What do I need to know about managing my diabetes?  Check your blood glucose before and after exercising. ? If your blood glucose is higher than 240 mg/dL (13.3 mmol/L) before you exercise, check your urine for ketones. If you have ketones in your urine, do not exercise until your blood glucose returns to normal.  Know the symptoms of low blood glucose (hypoglycemia) and how to treat it. Your risk for hypoglycemia increases during and after exercise. Common symptoms of hypoglycemia can include: ? Hunger. ? Anxiety. ? Sweating and feeling clammy. ? Confusion. ? Dizziness or feeling light-headed. ? Increased heart rate or palpitations. ? Blurry vision. ? Tingling or numbness around the mouth, lips, or tongue. ? Tremors or shakes. ? Irritability.  Keep a rapid-acting carbohydrate snack available before, during, and after exercise to help prevent or treat hypoglycemia.  Avoid injecting insulin into areas of the body that are going to be exercised. For example, avoid injecting insulin into: ? The arms, when playing tennis. ? The legs, when jogging.  Keep records of your exercise habits. Doing this can help you and your health care provider adjust your diabetes management plan as needed. Write down: ? Food that you eat before and after you exercise. ? Blood glucose levels before and after you exercise. ? The type and amount of exercise you have done. ? When your insulin is expected to peak,  if you use insulin. Avoid exercising at times when your insulin is peaking.  When you start a new exercise or activity, work with your health care provider to make sure the activity is safe for you, and to adjust your insulin, medicines, or food intake as needed.  Drink plenty of water while you  exercise to prevent dehydration or heat stroke. Drink enough fluid to keep your urine clear or pale yellow. This information is not intended to replace advice given to you by your health care provider. Make sure you discuss any questions you have with your health care provider. Document Released: 04/13/2003 Document Revised: 08/11/2015 Document Reviewed: 07/03/2015 Elsevier Interactive Patient Education  2018 [employer].

## 2017-10-31 NOTE — Assessment & Plan Note (Signed)
Energy levels much improved. Will repeat testosterone and PSA levels today.

## 2017-11-01 ENCOUNTER — Other Ambulatory Visit: Payer: Self-pay | Admitting: Physician Assistant

## 2017-11-02 MED ORDER — ALPRAZOLAM 1 MG PO TABS: ORAL_TABLET | ORAL | 0 refills | Status: DC

## 2017-11-03 ENCOUNTER — Telehealth: Payer: Self-pay | Admitting: Physician Assistant

## 2017-11-03 ENCOUNTER — Other Ambulatory Visit: Payer: Self-pay | Admitting: Physician Assistant

## 2017-11-03 ENCOUNTER — Encounter: Payer: Self-pay | Admitting: Physician Assistant

## 2017-11-03 DIAGNOSIS — E119 Type 2 diabetes mellitus without complications: Secondary | ICD-10-CM

## 2017-11-03 MED ORDER — SEMAGLUTIDE(0.25 OR 0.5MG/DOS) 2 MG/1.5ML ~~LOC~~ SOPN: 0.5000 mg | PEN_INJECTOR | SUBCUTANEOUS | 3 refills | Status: DC

## 2017-11-03 MED ORDER — ACCU-CHEK COMPACT PLUS CARE KIT: PACK | 0 refills | Status: DC

## 2017-11-03 MED ORDER — GLUCOSE BLOOD VI STRP: ORAL_STRIP | 12 refills | Status: DC

## 2017-11-03 MED FILL — FENOFIBRATE 145 MG TABLET: 145 | 30 days supply | Qty: 30 | Fill #4

## 2017-11-03 MED FILL — ALPRAZolam 1 MG TABS: 1 | 30 days supply | Qty: 60 | Fill #0

## 2017-11-03 MED FILL — ATORVASTATIN 40 MG TABLET: 40 | 30 days supply | Qty: 30 | Fill #4

## 2017-11-03 MED FILL — SYNJARDY XR 5-1,000 MG TAB: 5-1000 | 30 days supply | Qty: 60 | Fill #3

## 2017-11-03 MED FILL — OZEMPIC 0.25 OR 0.5 MG/DOSE: 2 | 28 days supply | Qty: 2 | Fill #0

## 2017-11-03 NOTE — Telephone Encounter (Signed)
Patient did request a new glucometer at his visit.  Rx sent for Accu check avivia glucometer and strip to his local pharmacy.

## 2017-11-03 NOTE — Telephone Encounter (Signed)
Copied from [place] [id number]. Topic: Quick Communication - Rx Refill/Question >> Nov 03, 2017  4:25 PM Oliver Pila B wrote: Pt states he asked for this already and is on his way to the pharmacy; contact pt if needed  Medication: Accu check glucose meter  Has the patient contacted their pharmacy? Yes.   (Agent: If no, request that the patient contact the pharmacy for the refill.) (Agent: If yes, when and what did the pharmacy advise?)  Preferred Pharmacy (with phone number or street name): [facility]   Agent: Please be advised that RX refills may take up to 3 business days. We ask that you follow-up with your pharmacy.

## 2017-11-04 MED ORDER — BASAGLAR KWIKPEN 100 UNIT/ML ~~LOC~~ SOPN: 100.0000 [IU] | PEN_INJECTOR | Freq: Two times a day (BID) | SUBCUTANEOUS | 1 refills | Status: DC

## 2017-11-04 MED FILL — BASAGLAR 100 UNIT/ML KWIKPE: 100 | 45 days supply | Qty: 90 | Fill #0

## 2017-11-13 ENCOUNTER — Other Ambulatory Visit: Payer: Self-pay | Admitting: Physician Assistant

## 2017-11-14 MED FILL — TESTOSTERONE CYP 200 MG/ML: 200 | 14 days supply | Qty: 2 | Fill #0

## 2017-11-14 NOTE — Telephone Encounter (Signed)
Testosterone last filled on 07/21/17 Last PSA: 10/31/17 Last OV: 10/31/17  Please advise in PCP absence

## 2017-11-24 MED FILL — OZEMPIC 0.25 OR 0.5 MG/DOSE: 2 | 28 days supply | Qty: 2 | Fill #1

## 2017-11-24 MED FILL — PREVIDENT 5000 1.1% DRY MOU: 1.1 | 30 days supply | Qty: 100 | Fill #2

## 2017-12-03 ENCOUNTER — Other Ambulatory Visit: Payer: Self-pay | Admitting: Physician Assistant

## 2017-12-03 ENCOUNTER — Encounter: Payer: Self-pay | Admitting: Physician Assistant

## 2017-12-03 MED FILL — SERTRALINE HCL 100 MG TAB: 100 | 90 days supply | Qty: 180 | Fill #1

## 2017-12-03 MED FILL — LISINOPRIL-HCTZ 10-12.5 TAB: 10-12.5 | 90 days supply | Qty: 90 | Fill #1

## 2017-12-03 MED FILL — buPROPion HCL ER (XL) 300 M: 300 | 90 days supply | Qty: 90 | Fill #1

## 2017-12-03 MED FILL — FENOFIBRATE 145 MG TAB: 145 | 30 days supply | Qty: 30 | Fill #5

## 2017-12-03 MED FILL — ATORVASTATIN 40 MG TABLET: 40 | 30 days supply | Qty: 30 | Fill #5

## 2017-12-03 MED FILL — SYNJARDY XR 5-1,000 MG TAB: 5-1000 | 30 days supply | Qty: 60 | Fill #4

## 2017-12-03 MED FILL — TESTOSTERONE CYP 200 MG/ML: 200 | 14 days supply | Qty: 2 | Fill #1

## 2017-12-04 MED FILL — ALPRAZolam 1 MG TABS: 1 | 30 days supply | Qty: 60 | Fill #0

## 2017-12-04 NOTE — Telephone Encounter (Signed)
Xanax last rx 11/02/17 #60

## 2017-12-22 MED FILL — OZEMPIC 0.25 OR 0.5 MG/DOSE: 2 | 28 days supply | Qty: 2 | Fill #2

## 2017-12-22 MED FILL — BASAGLAR 100 UNIT/ML KWIKPE: 100 | 45 days supply | Qty: 90 | Fill #1

## 2017-12-23 ENCOUNTER — Encounter: Payer: Self-pay | Admitting: Physician Assistant

## 2017-12-24 MED ORDER — ONDANSETRON 4 MG PO TBDP: 4.0000 mg | ORAL_TABLET | Freq: Three times a day (TID) | ORAL | 0 refills | Status: DC | PRN

## 2017-12-24 MED FILL — ONDANSETRON ODT 4 MG TABLET: 4 | 6 days supply | Qty: 20 | Fill #0

## 2017-12-30 ENCOUNTER — Other Ambulatory Visit: Payer: Self-pay | Admitting: Physician Assistant

## 2017-12-30 MED ORDER — INSULIN PEN NEEDLE 29G X 12.7MM MISC: 3 refills | Status: DC

## 2017-12-30 MED FILL — SYNJARDY XR 5-1,000 MG TAB: 5-1000 | 30 days supply | Qty: 60 | Fill #5

## 2017-12-30 MED FILL — ATORVASTATIN 40 MG TABLET: 40 | 30 days supply | Qty: 30 | Fill #0

## 2017-12-30 MED FILL — FENOFIBRATE 145 MG TABLET: 145 | 30 days supply | Qty: 30 | Fill #0

## 2017-12-30 MED FILL — TESTOSTERONE CYP 200 MG/ML: 200 | 14 days supply | Qty: 2 | Fill #2

## 2017-12-30 NOTE — Telephone Encounter (Signed)
Last rx Xanax 12/04/17 #60 Last OV: 10/31/17 CSC: 06/23/17 UDS: 06/23/17  Please advise

## 2018-01-02 MED FILL — ALPRAZolam 1 MG TABS: 1 | 30 days supply | Qty: 60 | Fill #0

## 2018-01-23 ENCOUNTER — Other Ambulatory Visit: Payer: Self-pay | Admitting: Physician Assistant

## 2018-01-23 MED FILL — OZEMPIC 0.25 OR 0.5 MG/DOSE: 2 | 28 days supply | Qty: 2 | Fill #3

## 2018-01-23 NOTE — Telephone Encounter (Signed)
Last OV: 10/31/2017 Last Fill: 12/29/2017, #60, 0 RF

## 2018-01-31 ENCOUNTER — Encounter: Payer: Self-pay | Admitting: Physician Assistant

## 2018-02-02 MED FILL — ALPRAZolam 1 MG TABS: 1 | 30 days supply | Qty: 60 | Fill #0

## 2018-02-03 MED ORDER — ALPRAZOLAM 1 MG PO TABS: ORAL_TABLET | ORAL | 1 refills | Status: DC

## 2018-02-05 MED FILL — TESTOSTERONE CYP 200 MG/ML: 200 | 14 days supply | Qty: 2 | Fill #3

## 2018-02-06 ENCOUNTER — Other Ambulatory Visit: Payer: Self-pay | Admitting: Physician Assistant

## 2018-02-06 MED FILL — ATORVASTATIN 40 MG TABLET: 40 | 30 days supply | Qty: 30 | Fill #1

## 2018-02-06 MED FILL — FENOFIBRATE 145 MG TABLET: 145 | 30 days supply | Qty: 30 | Fill #1

## 2018-03-03 ENCOUNTER — Other Ambulatory Visit: Payer: Self-pay | Admitting: Physician Assistant

## 2018-03-03 ENCOUNTER — Encounter: Payer: Self-pay | Admitting: Physician Assistant

## 2018-03-03 ENCOUNTER — Other Ambulatory Visit: Payer: Self-pay | Admitting: Family Medicine

## 2018-03-03 DIAGNOSIS — E119 Type 2 diabetes mellitus without complications: Secondary | ICD-10-CM

## 2018-03-03 MED FILL — ATORVASTATIN 40 MG TABLET: 40 | 30 days supply | Qty: 30 | Fill #2

## 2018-03-03 MED FILL — ULTICARE PEN NDL 12.7 MM 29: 29G X 12.7M | 50 days supply | Qty: 100 | Fill #0

## 2018-03-03 MED FILL — SERTRALINE HCL 100 MG TAB: 100 | 90 days supply | Qty: 180 | Fill #0

## 2018-03-03 MED FILL — FENOFIBRATE 145 MG TABLET: 145 | 30 days supply | Qty: 30 | Fill #2

## 2018-03-03 MED FILL — ALPRAZolam 1 MG TABS: 1 | 30 days supply | Qty: 60 | Fill #0

## 2018-03-04 MED FILL — buPROPion HCL ER (XL) 300 M: 300 | 90 days supply | Qty: 90 | Fill #0

## 2018-03-04 MED FILL — OZEMPIC 0.25 OR 0.5 MG/DOSE: 2 | 30 days supply | Qty: 2 | Fill #0

## 2018-03-04 MED FILL — SYNJARDY XR 5-1,000 MG TAB: 5-1000 | 30 days supply | Qty: 60 | Fill #0

## 2018-03-04 MED FILL — BASAGLAR 100 UNIT/ML KWIKPE: 100 | 45 days supply | Qty: 90 | Fill #0

## 2018-03-04 MED FILL — LISINOPRIL-HCTZ 10-12.5 MG: 10-12.5 | 90 days supply | Qty: 90 | Fill #0

## 2018-03-05 MED FILL — PREVIDENT 5000 1.1% DRY MOU: 1.1 | 30 days supply | Qty: 100 | Fill #3

## 2018-03-05 NOTE — Telephone Encounter (Signed)
Copied from [place] [phone]. Topic: Quick Communication - Rx Refill/Question >> Mar 05, 2018 12:50 PM Wynetta Emery, [place] C wrote: Medication: testosterone cypionate (DEPOTESTOSTERONE CYPIONATE) 200 MG/ML injection - pt says that he sent this request via mychart, all medications were filled but this one.   Has the patient contacted their pharmacy? Yes  (Agent: If no, request that the patient contact the pharmacy for the refill.) (Agent: If yes, when and what did the pharmacy advise?)  Preferred Pharmacy (with phone number or street name): [place], [place] - [phone] (Phone) [phone] (Fax)    Agent: Please be advised that RX refills may take up to 3 business days. We ask that you follow-up with your pharmacy.

## 2018-03-05 NOTE — Telephone Encounter (Signed)
Last refill:11/14/17 #2 mL, 3  Last OV:10/31/17

## 2018-03-06 MED FILL — TESTOSTERONE CYP 200 MG/ML: 200 | 28 days supply | Qty: 2 | Fill #0

## 2018-03-17 ENCOUNTER — Encounter: Payer: Self-pay | Admitting: Physician Assistant

## 2018-03-19 ENCOUNTER — Encounter: Payer: Self-pay | Admitting: Physician Assistant

## 2018-03-19 ENCOUNTER — Other Ambulatory Visit: Payer: Self-pay

## 2018-03-19 ENCOUNTER — Ambulatory Visit: Payer: Federal, State, Local not specified - PPO | Admitting: Physician Assistant

## 2018-03-19 VITALS — BP 110/78 | HR 87 | Temp 97.7°F | Resp 14 | Ht 72.0 in | Wt 222.0 lb

## 2018-03-19 DIAGNOSIS — I10 Essential (primary) hypertension: Secondary | ICD-10-CM | POA: Diagnosis not present

## 2018-03-19 DIAGNOSIS — E785 Hyperlipidemia, unspecified: Secondary | ICD-10-CM

## 2018-03-19 DIAGNOSIS — Z794 Long term (current) use of insulin: Secondary | ICD-10-CM | POA: Diagnosis not present

## 2018-03-19 DIAGNOSIS — F329 Major depressive disorder, single episode, unspecified: Secondary | ICD-10-CM

## 2018-03-19 DIAGNOSIS — B070 Plantar wart: Secondary | ICD-10-CM | POA: Diagnosis not present

## 2018-03-19 DIAGNOSIS — E113211 Type 2 diabetes mellitus with mild nonproliferative diabetic retinopathy with macular edema, right eye: Secondary | ICD-10-CM

## 2018-03-19 DIAGNOSIS — M722 Plantar fascial fibromatosis: Secondary | ICD-10-CM

## 2018-03-19 DIAGNOSIS — E119 Type 2 diabetes mellitus without complications: Secondary | ICD-10-CM

## 2018-03-19 DIAGNOSIS — E291 Testicular hypofunction: Secondary | ICD-10-CM | POA: Diagnosis not present

## 2018-03-19 DIAGNOSIS — E669 Obesity, unspecified: Secondary | ICD-10-CM

## 2018-03-19 DIAGNOSIS — Z6831 Body mass index (BMI) 31.0-31.9, adult: Secondary | ICD-10-CM

## 2018-03-19 DIAGNOSIS — F419 Anxiety disorder, unspecified: Secondary | ICD-10-CM

## 2018-03-19 DIAGNOSIS — F32A Depression, unspecified: Secondary | ICD-10-CM

## 2018-03-19 LAB — CBC WITH DIFFERENTIAL/PLATELET
Basophils Absolute: 0.1 10*3/uL (ref 0.0–0.1)
Basophils Relative: 0.7 % (ref 0.0–3.0)
Eosinophils Absolute: 0.2 10*3/uL (ref 0.0–0.7)
Eosinophils Relative: 2.5 % (ref 0.0–5.0)
HCT: 44.5 % (ref 39.0–52.0)
HEMOGLOBIN: 15 g/dL (ref 13.0–17.0)
Lymphocytes Relative: 29.5 % (ref 12.0–46.0)
Lymphs Abs: 2.2 10*3/uL (ref 0.7–4.0)
MCHC: 33.7 g/dL (ref 30.0–36.0)
MCV: 84.2 fl (ref 78.0–100.0)
Monocytes Absolute: 0.5 10*3/uL (ref 0.1–1.0)
Monocytes Relative: 6.5 % (ref 3.0–12.0)
Neutro Abs: 4.6 10*3/uL (ref 1.4–7.7)
Neutrophils Relative %: 60.8 % (ref 43.0–77.0)
Platelets: 213 10*3/uL (ref 150.0–400.0)
RBC: 5.29 Mil/uL (ref 4.22–5.81)
RDW: 14.7 % (ref 11.5–15.5)
WBC: 7.6 10*3/uL (ref 4.0–10.5)

## 2018-03-19 LAB — LIPID PANEL
Cholesterol: 165 mg/dL (ref 0–200)
HDL: 31.1 mg/dL — AB (ref >39.00)
NonHDL: 134.12
Total CHOL/HDL Ratio: 5
Triglycerides: 235 mg/dL — ABNORMAL HIGH (ref 0.0–149.0)
VLDL: 47 mg/dL — ABNORMAL HIGH (ref 0.0–40.0)

## 2018-03-19 LAB — COMPREHENSIVE METABOLIC PANEL
ALK PHOS: 60 U/L (ref 39–117)
ALT: 33 U/L (ref 0–53)
AST: 24 U/L (ref 0–37)
Albumin: 4.6 g/dL (ref 3.5–5.2)
BILIRUBIN TOTAL: 0.3 mg/dL (ref 0.2–1.2)
BUN: 25 mg/dL — ABNORMAL HIGH (ref 6–23)
CO2: 30 mEq/L (ref 19–32)
Calcium: 9.4 mg/dL (ref 8.4–10.5)
Chloride: 99 mEq/L (ref 96–112)
Creatinine, Ser: 1.06 mg/dL (ref 0.40–1.50)
GFR: 71.32 mL/min (ref >60.00)
Glucose, Bld: 159 mg/dL — ABNORMAL HIGH (ref 70–99)
Potassium: 4.2 mEq/L (ref 3.5–5.1)
Sodium: 137 mEq/L (ref 135–145)
Total Protein: 7.2 g/dL (ref 6.0–8.3)

## 2018-03-19 LAB — HEMOGLOBIN A1C: Hgb A1c MFr Bld: 6.5 % (ref 4.6–6.5)

## 2018-03-19 LAB — PSA: PSA: 0.36 ng/mL (ref 0.10–4.00)

## 2018-03-19 LAB — TESTOSTERONE: Testosterone: 526.75 ng/dL (ref 300.00–890.00)

## 2018-03-19 LAB — LDL CHOLESTEROL, DIRECT: Direct LDL: 108 mg/dL

## 2018-03-19 MED ORDER — MELOXICAM 15 MG PO TABS: 15.0000 mg | ORAL_TABLET | Freq: Every day | ORAL | 0 refills | Status: DC

## 2018-03-19 MED FILL — MELOXICAM 15 MG TABLET: 15 | 15 days supply | Qty: 15 | Fill #0

## 2018-03-19 NOTE — Patient Instructions (Signed)
Please go to the lab today for blood work.  I will call you with your results. We will alter treatment regimen(s) if indicated by your results.   For the Plantar Wart:  - Keep skin clean and dry. The area may blister over but try to avoid popping the area. We may need to repeat treatment in 10-14 days if not resolving. Sometimes it takes more than 1 treatment.   For Plantar Fasciitis:  - Wear supportive footwear with a good arch support.  - Do the Cold Can exercises we discussed.   - Take the Meloxicam once daily with food for the next week.  - Start the exercises (listed below).  - If not improving, we will need to set you up with Podiatry.  For Diabetes:  - Check blood glucose levels once in the morning (fasting) and once in the evening.   - Write this down. Message me in 1 week so we can make adjustments.  - We need to get this under control before it starts causing more significant issues.   - Please keep consistent with diet.  - If we cannot get this under control, I will have to send you back to Endocrinology.    Plantar Fasciitis Rehab Ask your health care provider which exercises are safe for you. Do exercises exactly as told by your health care provider and adjust them as directed. It is normal to feel mild stretching, pulling, tightness, or discomfort as you do these exercises, but you should stop right away if you feel sudden pain or your pain gets worse. Do not begin these exercises until told by your health care provider. Stretching and range of motion exercises These exercises warm up your muscles and joints and improve the movement and flexibility of your foot. These exercises also help to relieve pain. Exercise A: Plantar fascia stretch  1. Sit with your left / right leg crossed over your opposite knee. 2. Hold your heel with one hand with that thumb near your arch. With your other hand, hold your toes and gently pull them back toward the top of your foot. You should feel  a stretch on the bottom of your toes or your foot or both. 3. Hold this stretch for__________ seconds. 4. Slowly release your toes and return to the starting position. Repeat __________ times. Complete this exercise __________ times a day. Exercise B: Gastroc, standing  1. Stand with your hands against a wall. 2. Extend your left / right leg behind you, and bend your front knee slightly. 3. Keeping your heels on the floor and keeping your back knee straight, shift your weight toward the wall without arching your back. You should feel a gentle stretch in your left / right calf. 4. Hold this position for __________ seconds. Repeat __________ times. Complete this exercise __________ times a day. Exercise C: Soleus, standing 1. Stand with your hands against a wall. 2. Extend your left / right leg behind you, and bend your front knee slightly. 3. Keeping your heels on the floor, bend your back knee and slightly shift your weight over the back leg. You should feel a gentle stretch deep in your calf. 4. Hold this position for __________ seconds. Repeat __________ times. Complete this exercise __________ times a day. Exercise D: Gastrocsoleus, standing 1. Stand with the ball of your left / right foot on a step. The ball of your foot is on the walking surface, right under your toes. 2. Keep your other foot firmly on  the same step. 3. Hold onto the wall or a railing for balance. 4. Slowly lift your other foot, allowing your body weight to press your heel down over the edge of the step. You should feel a stretch in your left / right calf. 5. Hold this position for __________ seconds. 6. Return both feet to the step. 7. Repeat this exercise with a slight bend in your left / right knee. Repeat __________ times with your left / right knee straight and __________ times with your left / right knee bent. Complete this exercise __________ times a day. Balance exercise This exercise builds your balance and  strength control of your arch to help take pressure off your plantar fascia. Exercise E: Single leg stand 1. Without shoes, stand near a railing or in a doorway. You may hold onto the railing or door frame as needed. 2. Stand on your left / right foot. Keep your big toe down on the floor and try to keep your arch lifted. Do not let your foot roll inward. 3. Hold this position for __________ seconds. 4. If this exercise is too easy, you can try it with your eyes closed or while standing on a pillow. Repeat __________ times. Complete this exercise __________ times a day. This information is not intended to replace advice given to you by your health care provider. Make sure you discuss any questions you have with your health care provider. Document Released: 01/21/2005 Document Revised: 09/26/2015 Document Reviewed: 12/05/2014 Elsevier Interactive Patient Education  2019 [employer].

## 2018-03-19 NOTE — Progress Notes (Signed)
History of Present Illness: Patient is a 58 y.o. male who presents to clinic today for follow-up of Diabetes Mellitus II, previously uncontrolled with diabetic retinopathy.  Patient currently on medication regimen of Basaglar 65 units nightly, Ozempic 0.5 mg once weekly and Synjardy 06-998 mg once daily.  Endorses taking medications as directed. Endorses trying to keep a well-balanced diet. Is trying to stay active bur work schedule makes this quite difficult.  Denies vision changes, polyuria, polydipsia or polyphagia. Is not checking blood glucose as directed.   Latest Maintenance: A1C --  Lab Results  Component Value Date   HGBA1C 8.5 (H) 10/31/2017   Diabetic Eye Exam -- up-to-date Urine Microalbumin -- up-to-date Foot Exam -- Due. Patient endorses wart of R heel over several years. Is starting to be painful with ambulation. Would like assessed. Also notes over the past few weeks having pain in both heels. Notes this is the worst when taking first step out of bed in the morning or after episode of prolonged sitting. Gets a little better once he walks a bit but is still present. Notes wearing mostly dress shoes but has been trying to wear tennis shoes for the past few days.   Past Medical History:  Diagnosis Date  . Adjustment disorder with mixed anxiety and depressed mood   . Arthritis   . Asthma    history  . Diabetes mellitus    type 2  . Elevated blood pressure    history of high blood pressure readings  . History of chicken pox   . Hypercholesteremia   . Hypertension   . Internal hemorrhoids   . Tubular adenoma of colon     Current Outpatient Medications on File Prior to Visit  Medication Sig Dispense Refill  . ALPRAZolam (XANAX) 1 MG tablet TAKE 1-2 TABLETS BY MOUTH DAILY AT BEDTIME AS NEEDED FOR SLEEP OR ANXIETY 60 tablet 1  . atorvastatin (LIPITOR) 40 MG tablet TAKE 1 TABLET BY MOUTH DAILY 30 tablet 5  . Blood Glucose Monitoring Suppl (ACCU-CHEK COMPACT CARE KIT) KIT  Check blood sugar twice daily. Dx:E11.9 1 each 0  . buPROPion (WELLBUTRIN XL) 300 MG 24 hr tablet TAKE 1 TABLET BY MOUTH DAILY. 90 tablet 1  . Empagliflozin-metFORMIN HCl ER (SYNJARDY XR) 06-998 MG TB24 Take 2 tablets by mouth daily. Over due for Diabetes check. Please schedule appointment for more refills 60 tablet 0  . fenofibrate (TRICOR) 145 MG tablet TAKE 1 TABLET BY MOUTH ONCE DAILY 30 tablet 5  . fluticasone (FLONASE) 50 MCG/ACT nasal spray Place 2 sprays into both nostrils daily. 16 g 6  . glucose blood (ACCU-CHEK COMPACT PLUS) test strip Check blood sugars twice daily. Dx:E11.9 100 each 12  . Insulin Glargine (BASAGLAR KWIKPEN) 100 UNIT/ML SOPN Inject 1 mL (100 Units total) into the skin 2 (two) times daily. Over due for Diabetes check. Please schedule appointment for more refills 90 mL 0  . Insulin Pen Needle (BD ULTRA-FINE PEN NEEDLES) 29G X 12.7MM MISC USE AS DIRECTED TO INJECT BASAGLAR 100 each 3  . lisinopril-hydrochlorothiazide (PRINZIDE,ZESTORETIC) 10-12.5 MG tablet TAKE 1 TABLET BY MOUTH DAILY. 90 tablet 1  . ondansetron (ZOFRAN-ODT) 4 MG disintegrating tablet Take 1 tablet (4 mg total) by mouth every 8 (eight) hours as needed for nausea or vomiting. 20 tablet 0  . ONE TOUCH LANCETS MISC Use as directed 200 each 11  . Semaglutide,0.25 or 0.5MG/DOS, (OZEMPIC, 0.25 OR 0.5 MG/DOSE,) 2 MG/1.5ML SOPN Inject 0.5 mg into the skin once  a week. Over due for Diabetes check. Please schedule appointment for more refills 1.5 mL 0  . sertraline (ZOLOFT) 100 MG tablet TAKE 1 TABLET BY MOUTH TWICE DAILY 180 tablet 1  . sildenafil (VIAGRA) 100 MG tablet Take 0.5-1 tablets (50-100 mg total) by mouth daily as needed for erectile dysfunction. 30 tablet 5  . testosterone cypionate (DEPOTESTOSTERONE CYPIONATE) 200 MG/ML injection INJECT 0.5 MLS (100 MG TOTAL) INTO THE MUSCLE ONCE A WEEK. 2 mL 0   No current facility-administered medications on file prior to visit.     No Known Allergies  Family  History  Problem Relation Age of Onset  . Diabetes Mother   . Thyroid disease Mother        Questionable  . Heart disease Father        deceased  . Other Neg Hx        hypogonadism  . Colon cancer Neg Hx     Social History   Socioeconomic History  . Marital status: Married    Spouse name: Not on file  . Number of children: Not on file  . Years of education: Not on file  . Highest education level: Not on file  Occupational History  . Not on file  Social Needs  . Financial resource strain: Not on file  . Food insecurity:    Worry: Not on file    Inability: Not on file  . Transportation needs:    Medical: Not on file    Non-medical: Not on file  Tobacco Use  . Smoking status: Never Smoker  . Smokeless tobacco: Never Used  Substance and Sexual Activity  . Alcohol use: Yes    Alcohol/week: 7.0 - 14.0 standard drinks    Types: 7 - 14 Glasses of wine per week  . Drug use: No  . Sexual activity: Not on file  Lifestyle  . Physical activity:    Days per week: Not on file    Minutes per session: Not on file  . Stress: Not on file  Relationships  . Social connections:    Talks on phone: Not on file    Gets together: Not on file    Attends religious service: Not on file    Active member of club or organization: Not on file    Attends meetings of clubs or organizations: Not on file    Relationship status: Not on file  Other Topics Concern  . 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   Married -55 marriage (2nd marriage)   daughter 72,  2 sons (2nd marriage  11,6)   [place]   Never Smoked    Alcohol use-yes   Drug use-no    Regular exercise-no   Smoking Status:  never   Does Patient Exercise:  no   Caffeine use/day:  3-4 cups coffee daily   Drug Use:  no         Review of Systems: Pertinent ROS are listed in HPI  Physical Examination: BP 110/78   Pulse 87   Temp 97.7 F (36.5 C) (Oral)   Resp 14   Ht 6' (1.829 m)   Wt 222 lb  (100.7 kg)   SpO2 97%   BMI 30.11 kg/m  General appearance: alert, cooperative, appears stated age and no distress Head: Normocephalic, without obvious abnormality, atraumatic Lungs: clear to auscultation bilaterally Heart: regular rate and rhythm, S1, S2 normal, no murmur, click, rub or gallop Extremities: extremities normal, atraumatic,  no cyanosis or edema Pulses: 2+ and symmetric Skin: Skin color, texture, turgor normal. No rashes or lesions Lymph nodes: Cervical, supraclavicular, and axillary nodes normal. Neurologic: Alert and oriented X 3, normal strength and tone. Normal symmetric reflexes. Normal coordination and gait  Diabetic Foot Exam - Simple   Simple Foot Form Diabetic Foot exam was performed with the following findings:  Yes 03/19/2018 11:12 AM  Visual Inspection See comments:  Yes Sensation Testing Intact to touch and monofilament testing bilaterally:  Yes Pulse Check Posterior Tibialis and Dorsalis pulse intact bilaterally:  Yes Comments 5 mm plantar wart noted of medial aspect of heel of R foot. Patient with tenderness bilaterally with palpation over plantar fascia.      Assessment/Plan: 1. 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 Type 2 diabetes mellitus , with long-term current use of insulin ([facility]) Discussed with patient that it is a requirement he check glucose twice daily for me to continue prescribing insulin for him due to risk of hypoglycemia and associated consequences. He agrees to this. Will send me a copy of his glucose measurements in 1 week via MyChart. Repeat labs today. Foot exam updated today revealing plantar warts, plantar fasciitis but negative for decreased sensation, pulses or skin/nail changes. Will repeat labs today to include A1C.  - Comp Met (CMET) - CBC w/Diff - Lipid Profile  3. Essential hypertension BP stable. Continue current regimen. DASH diet  reviewed. Repeat labs today.  4. Hypogonadism in male Repeat testosterone levels and PSA currently. - PSA - Testosterone - CBC w/Diff  5. Class 1 obesity with serious comorbidity and body mass index (BMI) of 31.0 to 31.9 in adult, unspecified obesity type Reviewed dietary and exercise regimen. Continue current medications. Will monitor.  6. Hyperlipidemia, unspecified hyperlipidemia type Taking medications as directed. Recheck labs today. - Comp Met (CMET) - Lipid Profile - Hemoglobin A1c  7. Anxiety and depression Doing very well. Continue current regimen.   8. Plantar wart Cryotherapy applied x 1. Discussed likely need for repeat treatment. Supportive measures discussed. Follow-up 2 weeks for repeat cryotherapy.  9. Plantar fasciitis, bilateral Start cold can rolls and exercises. Handout given. Rx Meloxicam once daily x 10 days with food. Supportive footwear recommended. If not improving, will refer to Podiatry.

## 2018-03-21 ENCOUNTER — Encounter: Payer: Self-pay | Admitting: Physician Assistant

## 2018-04-02 ENCOUNTER — Other Ambulatory Visit: Payer: Self-pay | Admitting: Physician Assistant

## 2018-04-02 DIAGNOSIS — B9689 Other specified bacterial agents as the cause of diseases classified elsewhere: Secondary | ICD-10-CM

## 2018-04-02 DIAGNOSIS — E119 Type 2 diabetes mellitus without complications: Secondary | ICD-10-CM

## 2018-04-02 DIAGNOSIS — J208 Acute bronchitis due to other specified organisms: Secondary | ICD-10-CM

## 2018-04-02 MED FILL — FENOFIBRATE 145 MG TABLET: 145 | 30 days supply | Qty: 30 | Fill #3

## 2018-04-02 MED FILL — TESTOSTERONE CYPIONATE 200: 200 | 28 days supply | Qty: 2 | Fill #0

## 2018-04-02 MED FILL — ATORVASTATIN 40 MG TABLET: 40 | 30 days supply | Qty: 30 | Fill #3

## 2018-04-02 MED FILL — ALPRAZolam 1 MG TABS: 1 | 30 days supply | Qty: 60 | Fill #1

## 2018-04-02 MED FILL — OZEMPIC 0.25 OR 0.5 MG/DOSE: 2 | 30 days supply | Qty: 2 | Fill #0

## 2018-04-02 MED FILL — SYNJARDY XR 5-1,000 MG TAB: 5-1000 | 30 days supply | Qty: 60 | Fill #0

## 2018-04-02 NOTE — Telephone Encounter (Signed)
Last rx for Testosterone filled 03/06/18 Last testosterone level was within normal limits on 03/19/18

## 2018-04-03 ENCOUNTER — Encounter: Payer: Self-pay | Admitting: Medical

## 2018-04-03 ENCOUNTER — Ambulatory Visit: Payer: Federal, State, Local not specified - PPO | Admitting: Medical

## 2018-04-03 VITALS — BP 110/64 | HR 103 | Temp 98.5°F | Resp 16 | Ht 72.0 in | Wt 224.2 lb

## 2018-04-03 DIAGNOSIS — R52 Pain, unspecified: Secondary | ICD-10-CM | POA: Diagnosis not present

## 2018-04-03 DIAGNOSIS — J111 Influenza due to unidentified influenza virus with other respiratory manifestations: Secondary | ICD-10-CM | POA: Diagnosis not present

## 2018-04-03 DIAGNOSIS — J40 Bronchitis, not specified as acute or chronic: Secondary | ICD-10-CM | POA: Diagnosis not present

## 2018-04-03 DIAGNOSIS — J029 Acute pharyngitis, unspecified: Secondary | ICD-10-CM | POA: Diagnosis not present

## 2018-04-03 DIAGNOSIS — R059 Cough, unspecified: Secondary | ICD-10-CM

## 2018-04-03 DIAGNOSIS — R05 Cough: Secondary | ICD-10-CM

## 2018-04-03 LAB — POCT RAPID STREP A (OFFICE): Rapid Strep A Screen: NEGATIVE

## 2018-04-03 LAB — POC INFLUENZA A&B (BINAX/QUICKVUE)
Influenza A, POC: POSITIVE — AB
Influenza B, POC: NEGATIVE

## 2018-04-03 MED ORDER — DOXYCYCLINE HYCLATE 100 MG PO TABS: 100.0000 mg | ORAL_TABLET | Freq: Two times a day (BID) | ORAL | 0 refills | Status: DC

## 2018-04-03 MED ORDER — BENZONATATE 100 MG PO CAPS: 100.0000 mg | ORAL_CAPSULE | Freq: Three times a day (TID) | ORAL | 0 refills | Status: DC | PRN

## 2018-04-03 MED ORDER — OSELTAMIVIR PHOSPHATE 75 MG PO CAPS: 75.0000 mg | ORAL_CAPSULE | Freq: Two times a day (BID) | ORAL | 0 refills | Status: DC

## 2018-04-03 MED FILL — OSELTAMIVIR PHOSPHATE 75 MG: 75 | 5 days supply | Qty: 10 | Fill #0

## 2018-04-03 MED FILL — DOXYCYCLINE HYCLATE 100 MG: 100 | 10 days supply | Qty: 20 | Fill #0

## 2018-04-03 MED FILL — BENZONATATE 100 MG CAP: 100 | 10 days supply | Qty: 30 | Fill #0

## 2018-04-03 NOTE — Progress Notes (Signed)
Subjective:    Patient ID: William [name], male    DOB: [birth year], 58 y.o.   MRN: [id number]  HPI  Pt in for some recent cough, diffuse body aches, st, sweats, and some mild  loose stools.   Pt states above signs and symptoms after Wednesday.   Pt felt chills last night and some sweats last night.  Pt did get flu vaccine this year.   Review of Systems  Constitutional: Positive for chills and diaphoresis. Negative for fatigue and fever.  HENT: Positive for congestion and sore throat. Negative for ear pain, sinus pressure and sinus pain.   Respiratory: Positive for cough. Negative for chest tightness and wheezing.   Cardiovascular: Negative for chest pain and palpitations.  Gastrointestinal: Positive for diarrhea. Negative for abdominal pain, nausea and vomiting.       See hpi.  Genitourinary: Negative for difficulty urinating, enuresis, flank pain and frequency.  Musculoskeletal: Positive for myalgias. Negative for arthralgias, back pain and joint swelling.  Skin: Negative for pallor.  Neurological: Negative for dizziness, speech difficulty, weakness, light-headedness and headaches.  Hematological: Negative for adenopathy. Does not bruise/bleed easily.  Psychiatric/Behavioral: Negative for agitation, behavioral problems, decreased concentration, dysphoric mood and sleep disturbance. The patient is not nervous/anxious.     Past Medical History:  Diagnosis Date  . Adjustment disorder with mixed anxiety and depressed mood   . Arthritis   . Asthma    history  . Diabetes mellitus    type 2  . Elevated blood pressure    history of high blood pressure readings  . History of chicken pox   . Hypercholesteremia   . Hypertension   . Internal hemorrhoids   . Tubular adenoma of colon      Social History   Socioeconomic History  . Marital status: Married    Spouse name: Not on file  . Number of children: Not on file  . Years of education: Not on file  . Highest education  level: Not on file  Occupational History  . Not on file  Social Needs  . Financial resource strain: Not on file  . Food insecurity:    Worry: Not on file    Inability: Not on file  . Transportation needs:    Medical: Not on file    Non-medical: Not on file  Tobacco Use  . Smoking status: Never Smoker  . Smokeless tobacco: Never Used  Substance and Sexual Activity  . Alcohol use: Yes    Alcohol/week: 7.0 - 14.0 standard drinks    Types: 7 - 14 Glasses of wine per week  . Drug use: No  . Sexual activity: Not on file  Lifestyle  . Physical activity:    Days per week: Not on file    Minutes per session: Not on file  . Stress: Not on file  Relationships  . Social connections:    Talks on phone: Not on file    Gets together: Not on file    Attends religious service: Not on file    Active member of club or organization: Not on file    Attends meetings of clubs or organizations: Not on file    Relationship status: Not on file  . Intimate partner violence:    Fear of current or ex partner: Not on file    Emotionally abused: Not on file    Physically abused: Not on file    Forced sexual activity: Not on file  Other Topics Concern  .  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 ELBOW SURGERY     right elbow 2010, left elbow 08/06/10 [name])  . KNEE SURGERY     right knee new ACL- 1993,2000  . KNEE SURGERY     left knee 2003  . NASAL SEPTUM SURGERY     1992  . PARTIAL HIP ARTHROPLASTY     right hip replacement    Family History  Problem Relation Age of Onset  . Diabetes Mother   . Thyroid disease  Mother        Questionable  . Heart disease Father        deceased  . Other Neg Hx        hypogonadism  . Colon cancer Neg Hx     No Known Allergies  Current Outpatient Medications on File Prior to Visit  Medication Sig Dispense Refill  . ALPRAZolam (XANAX) 1 MG tablet TAKE 1-2 TABLETS BY MOUTH DAILY AT BEDTIME AS NEEDED FOR SLEEP OR ANXIETY 60 tablet 1  . atorvastatin (LIPITOR) 40 MG tablet TAKE 1 TABLET BY MOUTH DAILY 30 tablet 5  . Blood Glucose Monitoring Suppl (ACCU-CHEK COMPACT CARE KIT) KIT Check blood sugar twice daily. Dx:E11.9 1 each 0  . buPROPion (WELLBUTRIN XL) 300 MG 24 hr tablet TAKE 1 TABLET BY MOUTH DAILY. 90 tablet 1  . fenofibrate (TRICOR) 145 MG tablet TAKE 1 TABLET BY MOUTH ONCE DAILY 30 tablet 5  . fluticasone (FLONASE) 50 MCG/ACT nasal spray Place 2 sprays into both nostrils daily. 16 g 6  . glucose blood (ACCU-CHEK COMPACT PLUS) test strip Check blood sugars twice daily. Dx:E11.9 100 each 12  . Insulin Glargine (BASAGLAR KWIKPEN) 100 UNIT/ML SOPN Inject 1 mL (100 Units total) into the skin 2 (two) times daily. Over due for Diabetes check. Please schedule appointment for more refills 90 mL 0  . Insulin Pen Needle (BD ULTRA-FINE PEN NEEDLES) 29G X 12.7MM MISC USE AS DIRECTED TO INJECT BASAGLAR 100 each 3  . lisinopril-hydrochlorothiazide (PRINZIDE,ZESTORETIC) 10-12.5 MG tablet TAKE 1 TABLET BY MOUTH DAILY. 90 tablet 1  . meloxicam (MOBIC) 15 MG tablet Take 1 tablet (15 mg total) by mouth daily. 15 tablet 0  . ondansetron (ZOFRAN-ODT) 4 MG disintegrating tablet Take 1 tablet (4 mg total) by mouth every 8 (eight) hours as needed for nausea or vomiting. 20 tablet 0  . ONE TOUCH LANCETS MISC Use as directed 200 each 11  . OZEMPIC, 0.25 OR 0.5 MG/DOSE, 2 MG/1.5ML SOPN INJECT 0.5 MG INTO THE SKIN ONCE A WEEK. OVER DUE FOR DIABETES CHECK. PLEASE SCHEDULE APPOINTMENT FOR MORE REFILLS 1.5 mL 3  . sertraline (ZOLOFT) 100 MG tablet TAKE 1 TABLET BY MOUTH TWICE DAILY 180 tablet 1   . sildenafil (VIAGRA) 100 MG tablet Take 0.5-1 tablets (50-100 mg total) by mouth daily as needed for erectile dysfunction. 30 tablet 5  . SYNJARDY XR 06-998 MG TB24 TAKE 2 TABLETS BY MOUTH DAILY. OVER DUE FOR DIABETES CHECK.  PLEASE SCHEDULE APPOINTMENT FOR MORE REFILLS 60 tablet 3  . testosterone cypionate (DEPOTESTOSTERONE CYPIONATE) 200 MG/ML injection INJECT 0.5 MLS (100 MG TOTAL) INTO THE MUSCLE ONCE A WEEK. 2 mL 0   No current facility-administered medications on file prior to visit.     BP 110/64 (BP Location: Right Arm, Cuff Size: Large)   Pulse (!) 103   Temp 98.5 F (36.9 C) (Oral)   Resp 16   Ht 6' (1.829 m)   Wt 224 lb 3.2 oz (101.7 kg)   SpO2 96%   BMI 30.41 kg/m       Objective:   Physical Exam   General  Mental Status - Alert. General Appearance - Well groomed. Not in acute distress.  Skin Rashes- No Rashes.  HEENT Head- Normal. Ear Auditory Canal - Left- Normal. Right - Normal.Tympanic Membrane- Left- Normal. Right- Normal. Eye Sclera/Conjunctiva- Left- Normal. Right- Normal. Nose & Sinuses Nasal Mucosa- Left-  Boggy and Congested. Right-  Boggy and  Congested.Bilateral maxillary and frontal sinus pressure. Mouth & Throat Lips: Upper Lip- Normal: no dryness, cracking, pallor, cyanosis, or vesicular eruption. Lower Lip-Normal: no dryness, cracking, pallor, cyanosis or vesicular eruption. Buccal Mucosa- Bilateral- No Aphthous ulcers. Oropharynx- No Discharge or Erythema. Tonsils: Characteristics- Bilateral- No Erythema or Congestion. Size/Enlargement- Bilateral- No enlargement. Discharge- bilateral-None.  Neck Neck- Supple. No Masses.   Chest and Lung Exam Auscultation: Breath Sounds:-Clear even and unlabored.  Cardiovascular Auscultation:Rythm- Regular, rate and rhythm. Murmurs & Other Heart Sounds:Ausculatation of the heart reveal- No Murmurs.  Lymphatic Head & Neck General Head & Neck Lymphatics: Bilateral: Description- No Localized  lymphadenopathy.      Assessment & Plan:  You do have type a flu.  The rapid strep test was negative.  You do describe some productive cough and show me pictures of the colored mucus.  I have concern for early secondary bacterial infection/bronchitis.  Please start Tamiflu today as you are within the treatment timeframe window.  For secondary infection/bronchitis, I prescribed doxycycline antibiotic.  Rx advised and given.  For cough, I prescribed benzonatate.  Keep yourself hydrated with propel fitness water.  Any severe signs or symptoms worsening/changing then recommend ED evaluation as sometimes flu  can result in severe complications.  However expect that you will do well.  Follow-up in 7 days for any residual symptoms or sooner if needed.  Mackie Pai, PA-C

## 2018-04-03 NOTE — Patient Instructions (Addendum)
You do have type a flu.  The rapid strep test was negative.  You do describe some productive cough and show me pictures of the colored mucus.  I have concern for early secondary bacterial infection/bronchitis.  Please start Tamiflu today as you are within the treatment timeframe window.  For secondary infection/bronchitis, I prescribed doxycycline antibiotic.  Rx advised and given.  For cough, I prescribed benzonatate.  Keep yourself hydrated with propel fitness water.  Any severe signs or symptoms worsening/changing then recommend ED evaluation as sometimes flu can result in severe complications.  However expect that you will do well.  Follow-up in 7 days for any residual symptoms or sooner if needed.

## 2018-04-10 ENCOUNTER — Encounter: Payer: Self-pay | Admitting: Physician Assistant

## 2018-04-10 MED ORDER — MELOXICAM 15 MG PO TABS: 15.0000 mg | ORAL_TABLET | Freq: Every day | ORAL | 0 refills | Status: DC

## 2018-04-10 MED FILL — MELOXICAM 15 MG TABLET: 15 | 15 days supply | Qty: 15 | Fill #0

## 2018-04-13 DIAGNOSIS — K080 Exfoliation of teeth due to systemic causes: Secondary | ICD-10-CM | POA: Diagnosis not present

## 2018-04-13 MED FILL — PREVIDENT 5000 SENSITIVE PA: 1.1-5 | 20 days supply | Qty: 100 | Fill #0

## 2018-04-19 ENCOUNTER — Encounter: Payer: Self-pay | Admitting: Physician Assistant

## 2018-05-04 ENCOUNTER — Other Ambulatory Visit: Payer: Self-pay | Admitting: Physician Assistant

## 2018-05-04 MED FILL — FENOFIBRATE 145 MG TABLET: 145 | 30 days supply | Qty: 30 | Fill #4

## 2018-05-04 MED FILL — PREVIDENT 5000 SENSITIVE PA: 1.1-5 | 20 days supply | Qty: 100 | Fill #1

## 2018-05-04 MED FILL — TESTOSTERONE CYPIONATE 200: 200 | 14 days supply | Qty: 2 | Fill #0

## 2018-05-04 MED FILL — ATORVASTATIN 40 MG TABLET: 40 | 30 days supply | Qty: 30 | Fill #4

## 2018-05-04 MED FILL — ALPRAZolam 1 MG TABS: 1 | 30 days supply | Qty: 60 | Fill #0

## 2018-05-04 MED FILL — OZEMPIC 0.25 OR 0.5 MG/DOSE: 2 | 30 days supply | Qty: 2 | Fill #1

## 2018-05-04 MED FILL — SYNJARDY XR 5-1,000 MG TAB: 5-1000 | 30 days supply | Qty: 60 | Fill #1

## 2018-05-04 NOTE — Telephone Encounter (Signed)
Xanax last rx 02/03/18 #60 1 RF CSC: 06/23/17 UDS: 06/23/17  Testosterone last rx 04/02/18 Last testosterone 03/19/18  Please advise

## 2018-05-30 MED FILL — PREVIDENT 5000 SENSITIVE PA: 1.1-5 | 20 days supply | Qty: 100 | Fill #2

## 2018-06-03 MED FILL — ALPRAZolam 1 MG TABS: 1 | 30 days supply | Qty: 60 | Fill #1

## 2018-06-04 ENCOUNTER — Other Ambulatory Visit: Payer: Self-pay | Admitting: Physician Assistant

## 2018-06-04 DIAGNOSIS — E119 Type 2 diabetes mellitus without complications: Secondary | ICD-10-CM

## 2018-06-04 MED FILL — LISINOPRIL-HCTZ 10-12.5 MG: 10-12.5 | 90 days supply | Qty: 90 | Fill #1

## 2018-06-04 MED FILL — buPROPion HCL ER (XL) 300 M: 300 | 90 days supply | Qty: 90 | Fill #1

## 2018-06-04 MED FILL — ATORVASTATIN 40 MG TABLET: 40 | 30 days supply | Qty: 30 | Fill #5

## 2018-06-04 MED FILL — FENOFIBRATE 145 MG TABLET: 145 | 30 days supply | Qty: 30 | Fill #5

## 2018-06-04 MED FILL — SYNJARDY XR 5-1,000 MG TAB: 5-1000 | 30 days supply | Qty: 60 | Fill #2

## 2018-06-04 MED FILL — OZEMPIC 0.25 OR 0.5 MG/DOSE: 2 | 30 days supply | Qty: 2 | Fill #2

## 2018-06-04 MED FILL — SERTRALINE HCL 100 MG TAB: 100 | 90 days supply | Qty: 180 | Fill #1

## 2018-06-10 ENCOUNTER — Other Ambulatory Visit: Payer: Self-pay | Admitting: Physician Assistant

## 2018-06-11 MED FILL — TESTOSTERONE CYPIONATE 200: 200 | 14 days supply | Qty: 2 | Fill #0

## 2018-06-11 MED FILL — MELOXICAM 15 MG TABLET: 15 | 15 days supply | Qty: 15 | Fill #0

## 2018-06-18 IMAGING — CR DG HIP (WITH OR WITHOUT PELVIS) 2-3V*R*
3 series · 3 of 3 positions shown · non-contrast
Comparison: None.

CLINICAL DATA: Slipped and fell on ice right hip pain

EXAM:
DG HIP (WITH OR WITHOUT PELVIS) 2-3V RIGHT

[t pelvis a.p.]
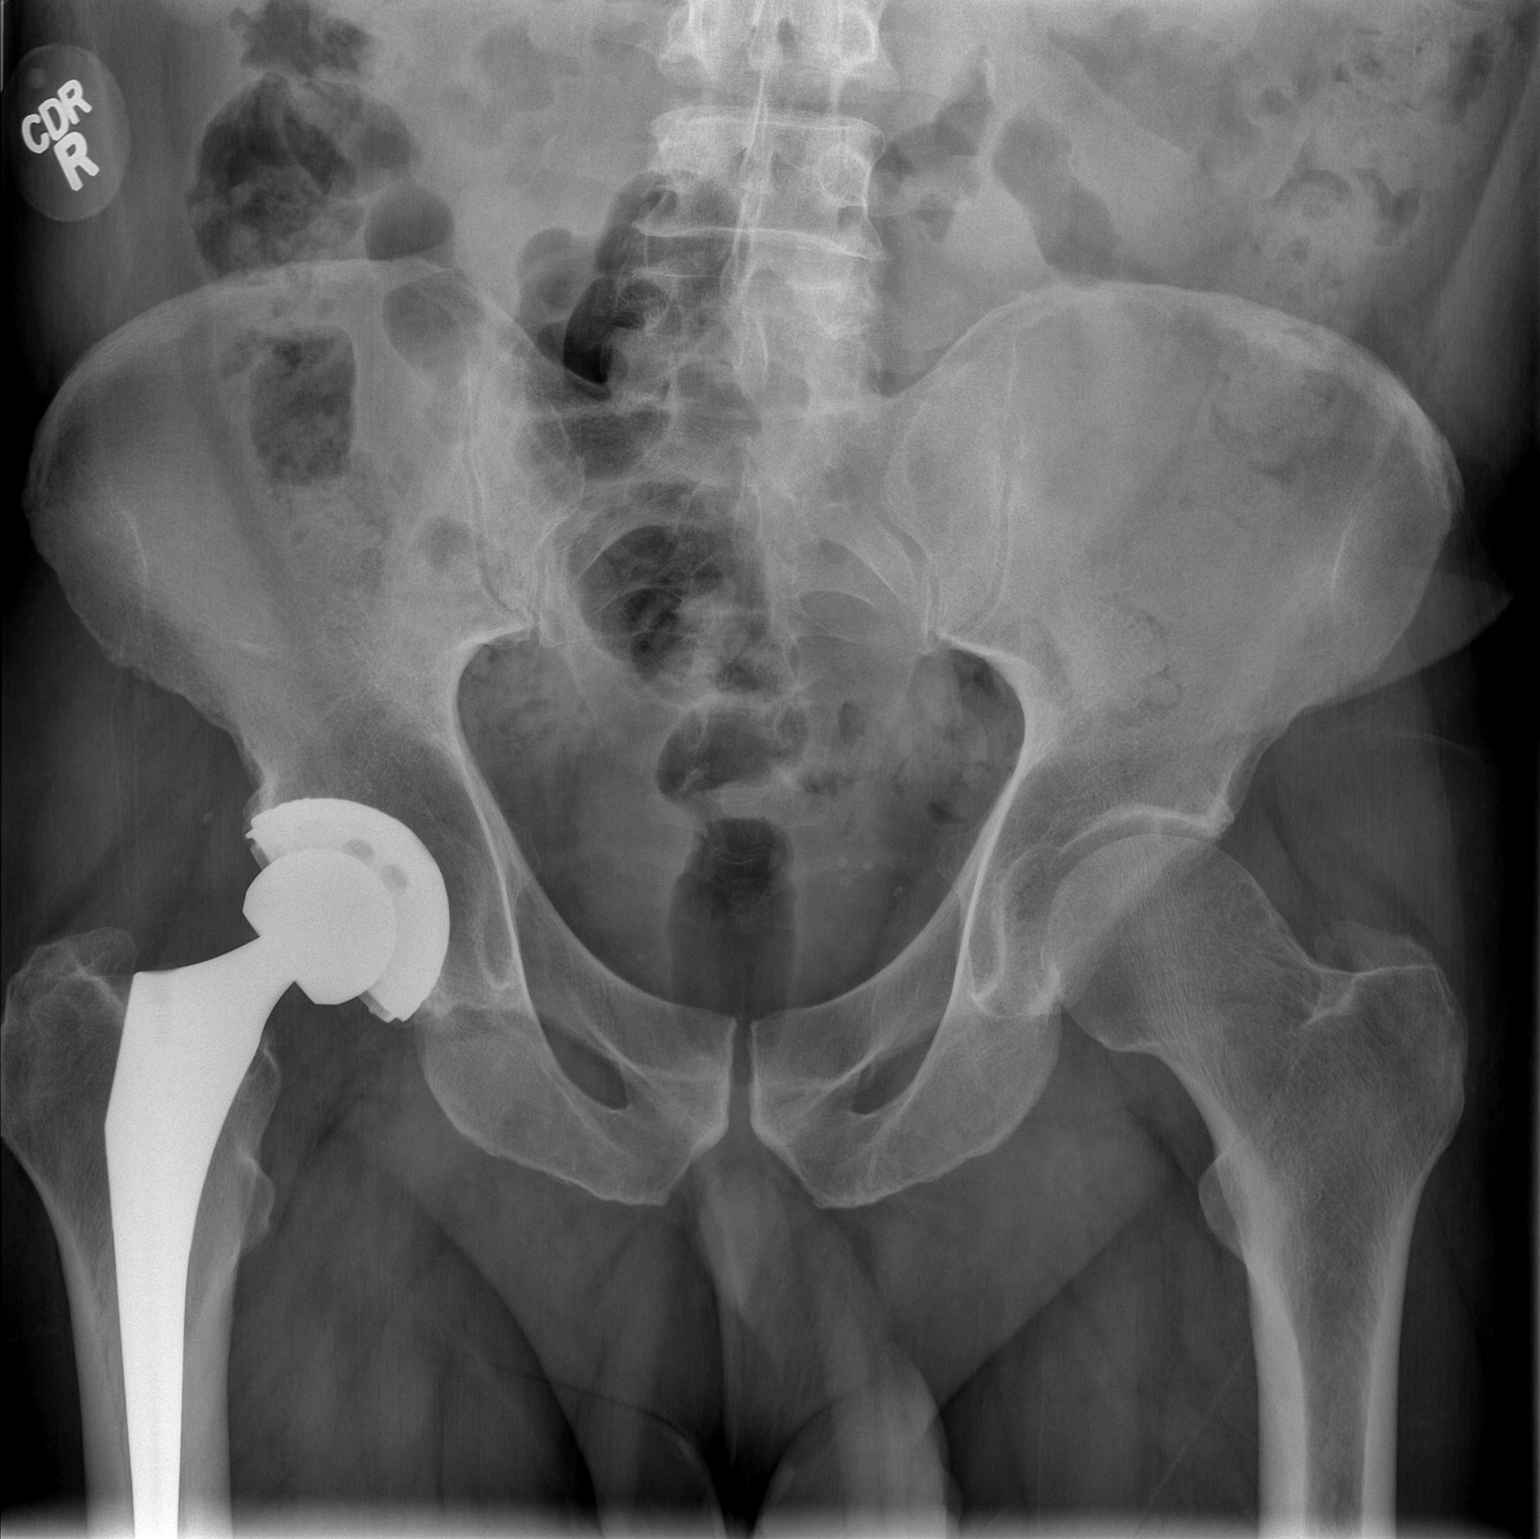

[t hip ap right]
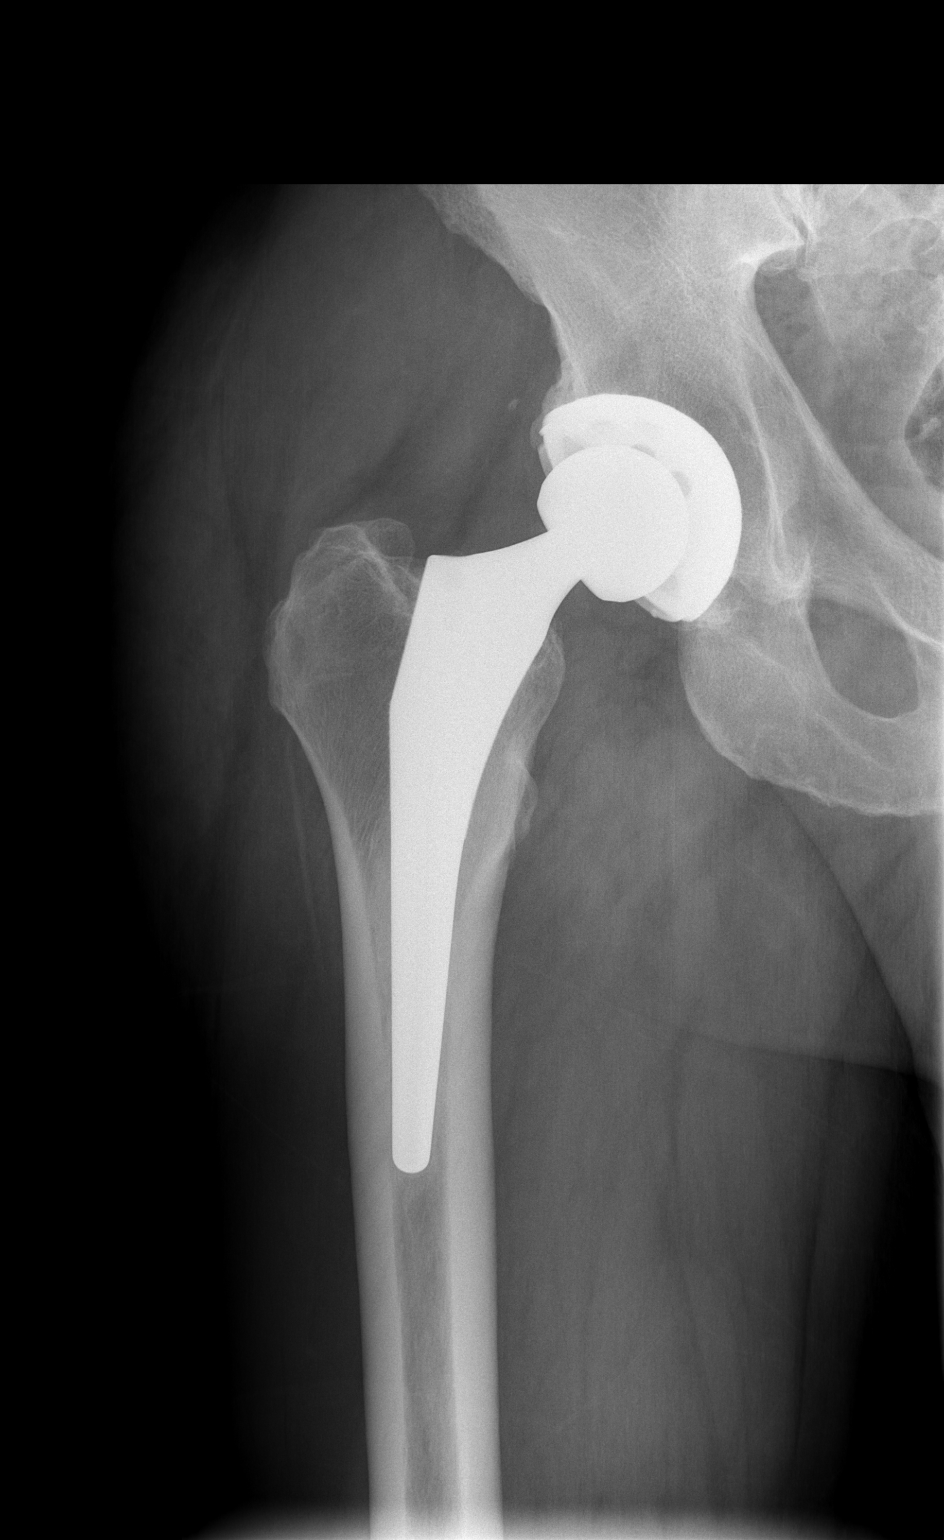

[t hip frog leg right]
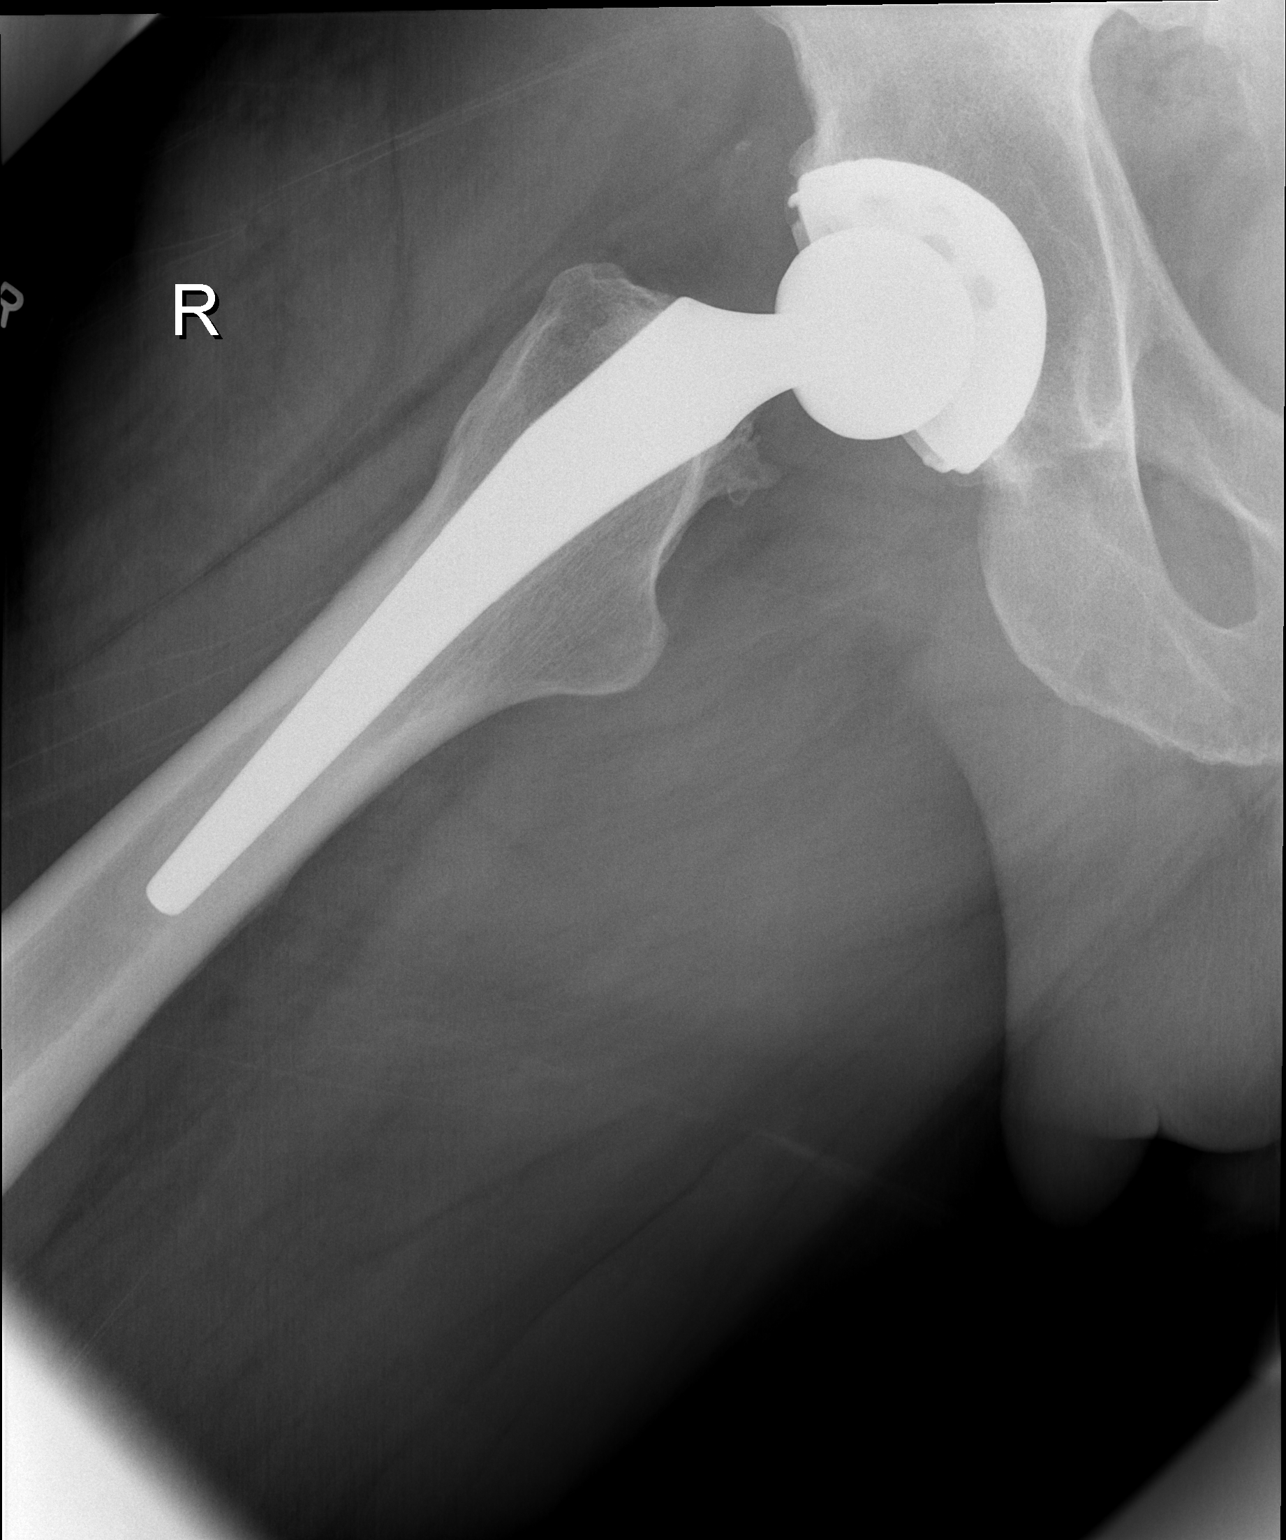

[3 of 3 positions shown; findings below may reference images not displayed]

FINDINGS: The SI joints appear symmetric. Pubic symphysis is intact. The
patient is status post right hip replacement. No acute fracture or
dislocation.
IMPRESSION: Status post right hip replacement.  No acute fracture or dislocation

## 2018-07-01 ENCOUNTER — Telehealth: Payer: Self-pay | Admitting: Physician Assistant

## 2018-07-01 NOTE — Telephone Encounter (Signed)
Ok to do cpe appt with pt in office on 07/07/18 or reschedule?

## 2018-07-01 NOTE — Telephone Encounter (Signed)
As long as patient screens negative he can have cpe in office

## 2018-07-03 ENCOUNTER — Encounter: Payer: Self-pay | Admitting: Physician Assistant

## 2018-07-03 ENCOUNTER — Other Ambulatory Visit: Payer: Self-pay | Admitting: Physician Assistant

## 2018-07-03 DIAGNOSIS — E291 Testicular hypofunction: Secondary | ICD-10-CM

## 2018-07-03 MED FILL — BASAGLAR 100 UNIT/ML KWIKPE: 100 | 60 days supply | Qty: 120 | Fill #0

## 2018-07-03 MED FILL — ATORVASTATIN 40 MG TABLET: 40 | 30 days supply | Qty: 30 | Fill #0

## 2018-07-03 MED FILL — SYNJARDY XR 5-1,000 MG TAB: 5-1000 | 30 days supply | Qty: 60 | Fill #3

## 2018-07-03 MED FILL — FENOFIBRATE 145 MG TABLET: 145 | 30 days supply | Qty: 30 | Fill #0

## 2018-07-03 MED FILL — OZEMPIC 0.25 OR 0.5 MG/DOSE: 2 | 30 days supply | Qty: 2 | Fill #3

## 2018-07-03 NOTE — Telephone Encounter (Signed)
Last refill:05/04/18 #60, 1 Last OV:03/19/18

## 2018-07-06 MED ORDER — ALPRAZOLAM 1 MG PO TABS: ORAL_TABLET | ORAL | 1 refills | Status: DC

## 2018-07-06 MED ORDER — TESTOSTERONE CYPIONATE 200 MG/ML IM SOLN: 100.0000 mg | INTRAMUSCULAR | 0 refills | Status: DC

## 2018-07-06 MED FILL — TESTOSTERONE CYPIONATE 200: 200 | 14 days supply | Qty: 2 | Fill #0

## 2018-07-06 MED FILL — ALPRAZolam 1 MG TABS: 1 | 30 days supply | Qty: 60 | Fill #0

## 2018-07-06 NOTE — Telephone Encounter (Signed)
Last rx 06/11/18 11ml  LOV: 03/19/18 DM, etc

## 2018-07-06 NOTE — Addendum Note (Signed)
Addended by: Brunetta Jeans on: 07/06/2018 11:13 AM   Modules accepted: Orders

## 2018-07-07 ENCOUNTER — Encounter: Payer: Federal, State, Local not specified - PPO | Admitting: Physician Assistant

## 2018-07-23 ENCOUNTER — Encounter: Payer: Self-pay | Admitting: Physician Assistant

## 2018-08-04 ENCOUNTER — Other Ambulatory Visit: Payer: Self-pay | Admitting: Physician Assistant

## 2018-08-04 DIAGNOSIS — E119 Type 2 diabetes mellitus without complications: Secondary | ICD-10-CM

## 2018-08-04 MED FILL — FENOFIBRATE 145 MG TABS: 145 | 30 days supply | Qty: 30 | Fill #1

## 2018-08-04 MED FILL — ALPRAZolam 1 MG TABS: 1 | 30 days supply | Qty: 60 | Fill #1

## 2018-08-04 MED FILL — ATORVASTATIN 40 MG TABLET: 40 | 30 days supply | Qty: 30 | Fill #1

## 2018-08-05 MED FILL — SYNJARDY XR 5-1,000 MG TAB: 5-1000 | 30 days supply | Qty: 60 | Fill #0

## 2018-08-05 MED FILL — OZEMPIC 0.25 OR 0.5 MG/DOSE: 2 | 30 days supply | Qty: 2 | Fill #0

## 2018-08-10 ENCOUNTER — Other Ambulatory Visit: Payer: Self-pay | Admitting: Physician Assistant

## 2018-08-10 DIAGNOSIS — E291 Testicular hypofunction: Secondary | ICD-10-CM

## 2018-08-11 NOTE — Telephone Encounter (Signed)
Testosterone 07/06/18 Last testosterone level 03/19/18  LOV: 03/19/18 DM follow up Upcoming appointment: 08/13/18

## 2018-08-12 MED FILL — TESTOSTERONE CYP 200 MG/ML: 200 | 15 days supply | Qty: 2 | Fill #0

## 2018-08-13 ENCOUNTER — Other Ambulatory Visit: Payer: Self-pay

## 2018-08-13 ENCOUNTER — Encounter: Payer: Self-pay | Admitting: Physician Assistant

## 2018-08-13 ENCOUNTER — Ambulatory Visit (INDEPENDENT_AMBULATORY_CARE_PROVIDER_SITE_OTHER): Payer: Federal, State, Local not specified - PPO | Admitting: Physician Assistant

## 2018-08-13 VITALS — BP 118/62 | HR 93 | Temp 97.3°F | Resp 16 | Ht 72.0 in | Wt 222.0 lb

## 2018-08-13 DIAGNOSIS — M722 Plantar fascial fibromatosis: Secondary | ICD-10-CM

## 2018-08-13 DIAGNOSIS — Z0000 Encounter for general adult medical examination without abnormal findings: Secondary | ICD-10-CM

## 2018-08-13 DIAGNOSIS — Z125 Encounter for screening for malignant neoplasm of prostate: Secondary | ICD-10-CM | POA: Diagnosis not present

## 2018-08-13 DIAGNOSIS — F419 Anxiety disorder, unspecified: Secondary | ICD-10-CM

## 2018-08-13 DIAGNOSIS — I10 Essential (primary) hypertension: Secondary | ICD-10-CM | POA: Diagnosis not present

## 2018-08-13 DIAGNOSIS — L821 Other seborrheic keratosis: Secondary | ICD-10-CM

## 2018-08-13 DIAGNOSIS — E119 Type 2 diabetes mellitus without complications: Secondary | ICD-10-CM | POA: Diagnosis not present

## 2018-08-13 DIAGNOSIS — H9193 Unspecified hearing loss, bilateral: Secondary | ICD-10-CM

## 2018-08-13 DIAGNOSIS — H6123 Impacted cerumen, bilateral: Secondary | ICD-10-CM

## 2018-08-13 DIAGNOSIS — E785 Hyperlipidemia, unspecified: Secondary | ICD-10-CM

## 2018-08-13 DIAGNOSIS — Z794 Long term (current) use of insulin: Secondary | ICD-10-CM

## 2018-08-13 DIAGNOSIS — F32A Depression, unspecified: Secondary | ICD-10-CM

## 2018-08-13 DIAGNOSIS — B070 Plantar wart: Secondary | ICD-10-CM

## 2018-08-13 DIAGNOSIS — Z6831 Body mass index (BMI) 31.0-31.9, adult: Secondary | ICD-10-CM

## 2018-08-13 DIAGNOSIS — Z23 Encounter for immunization: Secondary | ICD-10-CM

## 2018-08-13 DIAGNOSIS — D229 Melanocytic nevi, unspecified: Secondary | ICD-10-CM

## 2018-08-13 DIAGNOSIS — E669 Obesity, unspecified: Secondary | ICD-10-CM

## 2018-08-13 DIAGNOSIS — F329 Major depressive disorder, single episode, unspecified: Secondary | ICD-10-CM

## 2018-08-13 LAB — COMPREHENSIVE METABOLIC PANEL
ALT: 32 U/L (ref 0–53)
AST: 21 U/L (ref 0–37)
Albumin: 4.6 g/dL (ref 3.5–5.2)
Alkaline Phosphatase: 53 U/L (ref 39–117)
BUN: 22 mg/dL (ref 6–23)
CO2: 27 mEq/L (ref 19–32)
Calcium: 9.2 mg/dL (ref 8.4–10.5)
Chloride: 102 mEq/L (ref 96–112)
Creatinine, Ser: 1.03 mg/dL (ref 0.40–1.50)
GFR: 73.62 mL/min (ref >60.00)
Glucose, Bld: 147 mg/dL — ABNORMAL HIGH (ref 70–99)
Potassium: 4.3 mEq/L (ref 3.5–5.1)
Sodium: 138 mEq/L (ref 135–145)
Total Bilirubin: 0.4 mg/dL (ref 0.2–1.2)
Total Protein: 7 g/dL (ref 6.0–8.3)

## 2018-08-13 LAB — CBC WITH DIFFERENTIAL/PLATELET
Basophils Absolute: 0.1 10*3/uL (ref 0.0–0.1)
Basophils Relative: 0.8 % (ref 0.0–3.0)
Eosinophils Absolute: 0.2 10*3/uL (ref 0.0–0.7)
Eosinophils Relative: 3.1 % (ref 0.0–5.0)
HCT: 41.9 % (ref 39.0–52.0)
Hemoglobin: 14.2 g/dL (ref 13.0–17.0)
Lymphocytes Relative: 32.8 % (ref 12.0–46.0)
Lymphs Abs: 2.1 10*3/uL (ref 0.7–4.0)
MCHC: 33.9 g/dL (ref 30.0–36.0)
MCV: 86.7 fl (ref 78.0–100.0)
Monocytes Absolute: 0.4 10*3/uL (ref 0.1–1.0)
Monocytes Relative: 6.4 % (ref 3.0–12.0)
Neutro Abs: 3.6 10*3/uL (ref 1.4–7.7)
Neutrophils Relative %: 56.9 % (ref 43.0–77.0)
Platelets: 193 10*3/uL (ref 150.0–400.0)
RBC: 4.83 Mil/uL (ref 4.22–5.81)
RDW: 14.9 % (ref 11.5–15.5)
WBC: 6.3 10*3/uL (ref 4.0–10.5)

## 2018-08-13 LAB — LIPID PANEL
Cholesterol: 144 mg/dL (ref 0–200)
HDL: 26.5 mg/dL — ABNORMAL LOW (ref >39.00)
NonHDL: 117.37
Total CHOL/HDL Ratio: 5
Triglycerides: 307 mg/dL — ABNORMAL HIGH (ref 0.0–149.0)
VLDL: 61.4 mg/dL — ABNORMAL HIGH (ref 0.0–40.0)

## 2018-08-13 LAB — LDL CHOLESTEROL, DIRECT: Direct LDL: 87 mg/dL

## 2018-08-13 LAB — PSA: PSA: 0.37 ng/mL (ref 0.10–4.00)

## 2018-08-13 LAB — HEMOGLOBIN A1C: Hgb A1c MFr Bld: 7.1 % — ABNORMAL HIGH (ref 4.6–6.5)

## 2018-08-13 MED ORDER — TADALAFIL 5 MG PO TABS: 5.0000 mg | ORAL_TABLET | Freq: Every day | ORAL | 0 refills | Status: DC

## 2018-08-13 MED ORDER — CELECOXIB 100 MG PO CAPS: 100.0000 mg | ORAL_CAPSULE | Freq: Two times a day (BID) | ORAL | 0 refills | Status: DC

## 2018-08-13 MED FILL — CELECOXIB 100 MG CAP: 100 | 30 days supply | Qty: 60 | Fill #0

## 2018-08-13 NOTE — Progress Notes (Signed)
Patient presents to clinic today for annual exam.  Patient is fasting for labs.  Acute Concerns: Patient endorses gradual change in hearing over the past few years. Some occasional tinnitus bilaterally which is chronic.Notes decreased sharpness of words on the TV or during conversation. Does use Qtip on occasion. Has not had formal hearing evaluation.   Patient requesting Shingrix vaccine today as he is now 50. No prior personal history of shingles. + history of chicken pox.   Patient endorses having a possible flare-up of his plantar fasciitis. Notes is bilaterally present. Notes he restarted meloxicam but notes only some improvement with this. Is wearing supportive footwear. Notes pain 7/10.   Patient has some areas of concern on his back. Notes a couple of moles on the back that he has had for a while. Wife has notices change in size of lesion and feels they are more raised. Denies personal history of skin cancer. Does wear sunscreen when he goes shirtless.    Chronic Issues: Hypertension -- Patient is currently on a regimen of Lisinopril-HCTZ 10-12.5 mg QD. Endorses taking medications as directed. Patient denies chest pain, palpitations, lightheadedness, dizziness, vision changes or frequent headaches.  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 Patient with history of non-adherence. Patient is currently on a regimen of Basaglar, Synjardy and Ozempic. Endorses now taking medications as directed without fail. Is checking sugars here and there and not they look good but cannot give specifics. Is due for repeat eye exam. Has scheduled. Foot exam today. Still having issues as noted previously.   Hyperlipidemia associated with DM -- On regimen of Atorvastatin 40 mg daily. Is taking as directed. Is trying to keep active.   Hypogonadism with ED -- Currently on Testosterone  supplementation with stable Test levels and PSA. Has ED 2/2 hypogonadism and DM. Sildenafil is not working as well. Would like to try cialis if possible.   Anxiety and Depression -- Currently on Combination of Wellbutrin 300 mg daily and Sertraline 100 mg daily. Has done very well on this regimen. Xanax prn for acute anxiety and sleep.   Health Maintenance: Immunizations -- UTD Colonoscopy -- UTD  Past Medical History:  Diagnosis Date  . Adjustment disorder with mixed anxiety and depressed mood   . Arthritis   . Asthma    history  . Diabetes mellitus    type 2  . Elevated blood pressure    history of high blood pressure readings  . History of chicken pox   . Hypercholesteremia   . Hypertension   . Internal hemorrhoids   . Tubular adenoma of colon     Past Surgical History:  Procedure Laterality Date  . ELBOW SURGERY     right elbow 2010, left elbow 08/06/10 [name])  . KNEE SURGERY     right knee new ACL- 1993,2000  . KNEE SURGERY     left knee 2003  . NASAL SEPTUM SURGERY     1992  . PARTIAL HIP ARTHROPLASTY     right hip replacement    Current Outpatient Medications on File Prior to Visit  Medication Sig Dispense Refill  . ALPRAZolam (XANAX) 1 MG tablet 1 tab PO up to BID PRN 60 tablet 1  . atorvastatin (LIPITOR) 40 MG tablet TAKE 1 TABLET BY MOUTH DAILY 30 tablet 5  . Blood Glucose Monitoring Suppl (ACCU-CHEK COMPACT CARE KIT) KIT Check blood  sugar twice daily. Dx:E11.9 1 each 0  . buPROPion (WELLBUTRIN XL) 300 MG 24 hr tablet TAKE 1 TABLET BY MOUTH DAILY. 90 tablet 1  . fenofibrate (TRICOR) 145 MG tablet TAKE 1 TABLET BY MOUTH ONCE DAILY 30 tablet 5  . fluticasone (FLONASE) 50 MCG/ACT nasal spray Place 2 sprays into both nostrils daily. 16 g 6  . glucose blood (ACCU-CHEK COMPACT PLUS) test strip Check blood sugars twice daily. Dx:E11.9 100 each 12  . Insulin Glargine (BASAGLAR KWIKPEN) 100 UNIT/ML SOPN Inject 1 mL (100 Units total) into the skin 2 (two) times  daily. 120 mL 2  . Insulin Pen Needle (BD ULTRA-FINE PEN NEEDLES) 29G X 12.7MM MISC USE AS DIRECTED TO INJECT BASAGLAR 100 each 3  . lisinopril-hydrochlorothiazide (PRINZIDE,ZESTORETIC) 10-12.5 MG tablet TAKE 1 TABLET BY MOUTH DAILY. 90 tablet 1  . meloxicam (MOBIC) 15 MG tablet TAKE 1 TABLET (15 MG TOTAL) BY MOUTH DAILY. 15 tablet 0  . ondansetron (ZOFRAN-ODT) 4 MG disintegrating tablet Take 1 tablet (4 mg total) by mouth every 8 (eight) hours as needed for nausea or vomiting. 20 tablet 0  . ONE TOUCH LANCETS MISC Use as directed 200 each 11  . OZEMPIC, 0.25 OR 0.5 MG/DOSE, 2 MG/1.5ML SOPN INJECT 0.5 MG INTO THE SKIN ONCE A WEEK. OVER DUE FOR DIABETES CHECK. PLEASE SCHEDULE APPOINTMENT FOR MORE REFILLS 1.5 mL 3  . sertraline (ZOLOFT) 100 MG tablet TAKE 1 TABLET BY MOUTH TWICE DAILY 180 tablet 1  . sildenafil (VIAGRA) 100 MG tablet Take 0.5-1 tablets (50-100 mg total) by mouth daily as needed for erectile dysfunction. 30 tablet 5  . SYNJARDY XR 06-998 MG TB24 TAKE 2 TABLETS BY MOUTH DAILY. OVER DUE FOR DIABETES CHECK. PLEASE SCHEDULE APPOINTMENT FOR MORE REFILLS 60 tablet 3  . testosterone cypionate (DEPOTESTOSTERONE CYPIONATE) 200 MG/ML injection INJECT 0.5 MLS (100 MG TOTAL) INTO THE MUSCLE ONCE A WEEK. 2 mL 3   No current facility-administered medications on file prior to visit.     No Known Allergies  Family History  Problem Relation Age of Onset  . Diabetes Mother   . Thyroid disease Mother        Questionable  . Heart disease Father        deceased  . Other Neg Hx        hypogonadism  . Colon cancer Neg Hx     Social History   Socioeconomic History  . Marital status: Married    Spouse name: Not on file  . Number of children: Not on file  . Years of education: Not on file  . Highest education level: Not on file  Occupational History  . Not on file  Social Needs  . Financial resource strain: Not on file  . Food insecurity    Worry: Not on file    Inability: Not on file   . Transportation needs    Medical: Not on file    Non-medical: Not on file  Tobacco Use  . Smoking status: Never Smoker  . Smokeless tobacco: Never Used  Substance and Sexual Activity  . Alcohol use: Yes    Alcohol/week: 7.0 - 14.0 standard drinks    Types: 7 - 14 Glasses of wine per week  . Drug use: No  . Sexual activity: Not on file  Lifestyle  . Physical activity    Days per week: Not on file    Minutes per session: Not on file  . Stress: Not on file  Relationships  .  Social Herbalist on phone: Not on file    Gets together: Not on file    Attends religious service: Not on file    Active member of club or organization: Not on file    Attends meetings of clubs or organizations: Not on file    Relationship status: Not on file  . Intimate partner violence    Fear of current or ex partner: Not on file    Emotionally abused: Not on file    Physically abused: Not on file    Forced sexual activity: Not on file  Other Topics Concern  . 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   Married -45 marriage (2nd marriage)   daughter 67,  2 sons (2nd marriage  11,6)   [place]   Never Smoked    Alcohol use-yes   Drug use-no    Regular exercise-no   Smoking Status:  never   Does Patient Exercise:  no   Caffeine use/day:  3-4 cups coffee daily   Drug Use:  no         Review of Systems  Constitutional: Negative for fever and weight loss.  HENT: Negative for ear discharge, ear pain, hearing loss and tinnitus.   Eyes: Negative for blurred vision, double vision, photophobia and pain.  Respiratory: Negative for cough and shortness of breath.   Cardiovascular: Negative for chest pain and palpitations.  Gastrointestinal: Negative for abdominal pain, blood in stool, constipation, diarrhea, heartburn, melena, nausea and vomiting.  Genitourinary: Negative for dysuria, flank pain, frequency, hematuria and urgency.  Musculoskeletal: Negative for falls.   Neurological: Negative for dizziness, loss of consciousness and headaches.  Endo/Heme/Allergies: Negative for environmental allergies.  Psychiatric/Behavioral: Negative for depression, hallucinations, substance abuse and suicidal ideas. The patient is not nervous/anxious and does not have insomnia.    BP 118/62   Pulse 93   Temp (!) 97.3 F (36.3 C) (Skin)   Resp 16   Ht 6' (1.829 m)   Wt 222 lb (100.7 kg)   SpO2 98%   BMI 30.11 kg/m   Physical Exam Vitals signs reviewed.  Constitutional:      General: He is not in acute distress.    Appearance: He is well-developed. He is not diaphoretic.  HENT:     Head: Normocephalic and atraumatic.     Right Ear: Tympanic membrane, ear canal and external ear normal.     Left Ear: Tympanic membrane, ear canal and external ear normal.     Nose: Nose normal.     Mouth/Throat:     Pharynx: No posterior oropharyngeal erythema.  Eyes:     Conjunctiva/sclera: Conjunctivae normal.     Pupils: Pupils are equal, round, and reactive to light.  Neck:     Musculoskeletal: Neck supple.     Thyroid: No thyromegaly.  Cardiovascular:     Rate and Rhythm: Normal rate and regular rhythm.     Pulses:          Dorsalis pedis pulses are 2+ on the right side and 2+ on the left side.       Posterior tibial pulses are 2+ on the right side and 2+ on the left side.     Heart sounds: Normal heart sounds.  Pulmonary:     Effort: Pulmonary effort is normal. No respiratory distress.     Breath sounds: Normal breath sounds. No wheezing or rales.  Chest:     Chest wall:  No tenderness.  Abdominal:     General: Bowel sounds are normal. There is no distension.     Palpations: Abdomen is soft. There is no mass.     Tenderness: There is no abdominal tenderness. There is no guarding or rebound.  Musculoskeletal:     Right foot: Normal range of motion.     Left foot: Normal range of motion.  Feet:     Right foot:     Protective Sensation: 6 sites tested. 6 sites  sensed.     Skin integrity: Skin integrity normal.     Toenail Condition: Right toenails are normal.     Left foot:     Protective Sensation: 6 sites tested. 6 sites sensed.     Skin integrity: Skin integrity normal.     Toenail Condition: Left toenails are normal.     Comments: Plantar wart again noted of heel of R foot, about 5 mm in diameter presently. Lymphadenopathy:     Cervical: No cervical adenopathy.  Skin:    General: Skin is warm and dry.     Findings: No rash.       Neurological:     Mental Status: He is alert and oriented to person, place, and time.     Cranial Nerves: No cranial nerve deficit.    Assessment/Plan: 1. Visit for preventive health examination Depression screen negative. Health Maintenance reviewed -- He has repeat eye examination scheduled. Preventive schedule discussed and handout given in AVS. Will obtain fasting labs today.  2. Prostate cancer screening The natural history of prostate cancer and ongoing controversy regarding screening and potential treatment outcomes of prostate cancer has been discussed with the patient. The meaning of a false positive PSA and a false negative PSA has been discussed. He indicates understanding of the limitations of this screening test and wishes to proceed with screening PSA testing. - PSA  3. Need for shingles vaccine Shingrix 1 of 2 given today. - Varicella-zoster vaccine IM  4. Type 2 diabetes mellitus without complication, with long-term current use of insulin ([place]) Repeat labs today to see current control. He has eye appointment scheduled. Good sensation and nail health on foot examination today.  - Comprehensive metabolic panel - Hemoglobin A1c - Lipid panel  5. Hyperlipidemia, unspecified hyperlipidemia type Continue current regimen. Repeat fasting labs today. - Hemoglobin A1c - Lipid panel  6. Essential hypertension BP normotensive. Asymptomatic. Continue current regimen. Repeat labs today. -  Comprehensive metabolic panel - Hemoglobin A1c - Lipid panel  7. Anxiety and depression Stable. Continue current regimen.  8. Class 1 obesity with serious comorbidity and body mass index (BMI) of 31.0 to 31.9 in adult, unspecified obesity type Dietary and exercise recommendations again reviewed with patient. Continue testosterone therapy.  9. Multiple nevi 10. Seborrheic keratoses Discussed benign nature of SKs. Can be removed for cosmetic purposes or if irritated but not harmful to him. Declines removal. Referral to dermatology recommended due to numerous nevi. Patient declines. Skin health and protection reviewed with patient.   11. Bilateral hearing loss, unspecified hearing loss type Noted on screen today. Referral to Audiology for formal assessment placed. - Ambulatory referral to Audiology  12. Bilateral impacted cerumen Removed via irrigation as curette removal unsuccessful. Hearing test performed after cleaning.  13. Plantar fasciitis, bilateral Attempt trial of Celebrex BID. Stretching exercises reviewed. Discussed podiatry referral. He will consider this.  14. Plantar wart Cryotherapy applied today. Supportive measures reviewed. This is 2nd treatment. If not resolving, will send to  Podiatry.   Leeanne Rio, PA-C

## 2018-08-13 NOTE — Patient Instructions (Signed)
Please go to the lab today for blood work.  I will call you with your results. We will alter treatment regimen(s) if indicated by your results.   You will be contacted for assessment with Audiology for further assessment regarding hearing loss.  Stop the Viagra. Start the Cialis daily. Follow-up with me via MyChart in 3-4 weeks to let me know how this is going.   Stop the Meloxicam. Start the Celebrex twice daily and keep using supportive foot wear. If not improving we will need Podiatry to assess.   Please consider letting me set you up with Dermatology for an annual comprehensive skin exam.  We will alter regimen for diabetes based on lab results.   Let me know if wart is not resolving with the treatment given today.   Seborrheic Keratosis A seborrheic keratosis is a common, noncancerous (benign) skin growth. These growths are velvety, waxy, rough, tan, brown, or black spots that appear on the skin. These skin growths can be flat or raised, and scaly. What are the causes? The cause of this condition is not known. What increases the risk? You are more likely to develop this condition if you:  Have a family history of seborrheic keratosis.  Are 50 or older.  Are pregnant.  Have had estrogen replacement therapy. What are the signs or symptoms? Symptoms of this condition include growths on the face, chest, shoulders, back, or other areas. These growths:  Are usually painless, but may become irritated and itchy.  Can be yellow, brown, black, or other colors.  Are slightly raised or have a flat surface.  Are sometimes rough or wart-like in texture.  Are often velvety or waxy on the surface.  Are round or oval-shaped.  Often occur in groups, but may occur as a single growth. How is this diagnosed? This condition is diagnosed with a medical history and physical exam.  A sample of the growth may be tested (skin biopsy).  You may need to see a skin specialist  (dermatologist). How is this treated? Treatment is not usually needed for this condition, unless the growths are irritated or bleed often.  You may also choose to have the growths removed if you do not like their appearance. ? Most commonly, these growths are treated with a procedure in which liquid nitrogen is applied to "freeze" off the growth (cryosurgery). ? They may also be burned off with electricity (electrocautery) or removed by scraping (curettage). Follow these instructions at home:  Watch your growth for any changes.  Keep all follow-up visits as told by your health care provider. This is important.  Do not scratch or pick at the growth or growths. This can cause them to become irritated or infected. Contact a health care provider if:  You suddenly have many new growths.  Your growth bleeds, itches, or hurts.  Your growth suddenly becomes larger or changes color. Summary  A seborrheic keratosis is a common, noncancerous (benign) skin growth.  Treatment is not usually needed for this condition, unless the growths are irritated or bleed often.  Watch your growth for any changes.  Contact a health care provider if you suddenly have many new growths or your growth suddenly becomes larger or changes color.  Keep all follow-up visits as told by your health care provider. This is important. This information is not intended to replace advice given to you by your health care provider. Make sure you discuss any questions you have with your health care provider. Document Released: 02/23/2010  Document Revised: 06/05/2017 Document Reviewed: 06/05/2017 Elsevier Patient Education  [place].   Plantar Fasciitis  Plantar fasciitis is a painful foot condition that affects the heel. It occurs when the band of tissue that connects the toes to the heel bone (plantar fascia) becomes irritated. This can happen as the result of exercising too much or doing other repetitive  activities (overuse injury). The pain from plantar fasciitis can range from mild irritation to severe pain that makes it difficult to walk or move. The pain is usually worse in the morning after sleeping, or after sitting or lying down for a while. Pain may also be worse after long periods of walking or standing. What are the causes? This condition may be caused by:  Standing for long periods of time.  Wearing shoes that do not have good arch support.  Doing activities that put stress on joints (high-impact activities), including running, aerobics, and ballet.  Being overweight.  An abnormal way of walking (gait).  Tight muscles in the back of your lower leg (calf).  High arches in your feet.  Starting a new athletic activity. What are the signs or symptoms? The main symptom of this condition is heel pain. Pain may:  Be worse with first steps after a time of rest, especially in the morning after sleeping or after you have been sitting or lying down for a while.  Be worse after long periods of standing still.  Decrease after 30-45 minutes of activity, such as gentle walking. How is this diagnosed? This condition may be diagnosed based on your medical history and your symptoms. Your health care provider may ask questions about your activity level. Your health care provider will do a physical exam to check for:  A tender area on the bottom of your foot.  A high arch in your foot.  Pain when you move your foot.  Difficulty moving your foot. You may have imaging tests to confirm the diagnosis, such as:  X-rays.  Ultrasound.  MRI. How is this treated? Treatment for plantar fasciitis depends on how severe your condition is. Treatment may include:  Rest, ice, applying pressure (compression), and raising the affected foot (elevation). This may be called RICE therapy. Your health care provider may recommend RICE therapy along with over-the-counter pain medicines to manage your  pain.  Exercises to stretch your calves and your plantar fascia.  A splint that holds your foot in a stretched, upward position while you sleep (night splint).  Physical therapy to relieve symptoms and prevent problems in the future.  Injections of steroid medicine (cortisone) to relieve pain and inflammation.  Stimulating your plantar fascia with electrical impulses (extracorporeal shock wave therapy). This is usually the last treatment option before surgery.  Surgery, if other treatments have not worked after 12 months. Follow these instructions at home:  Managing pain, stiffness, and swelling  If directed, put ice on the painful area: ? Put ice in a plastic bag, or use a frozen bottle of water. ? Place a towel between your skin and the bag or bottle. ? Roll the bottom of your foot over the bag or bottle. ? Do this for 20 minutes, 2-3 times a day.  Wear athletic shoes that have air-sole or gel-sole cushions, or try wearing soft shoe inserts that are designed for plantar fasciitis.  Raise (elevate) your foot above the level of your heart while you are sitting or lying down. Activity  Avoid activities that cause pain. Ask your health care  provider what activities are safe for you.  Do physical therapy exercises and stretches as told by your health care provider.  Try activities and forms of exercise that are easier on your joints (low-impact). Examples include swimming, water aerobics, and biking. General instructions  Take over-the-counter and prescription medicines only as told by your health care provider.  Wear a night splint while sleeping, if told by your health care provider. Loosen the splint if your toes tingle, become numb, or turn cold and blue.  Maintain a healthy weight, or work with your health care provider to lose weight as needed.  Keep all follow-up visits as told by your health care provider. This is important. Contact a health care provider if you:   Have symptoms that do not go away after caring for yourself at home.  Have pain that gets worse.  Have pain that affects your ability to move or do your daily activities. Summary  Plantar fasciitis is a painful foot condition that affects the heel. It occurs when the band of tissue that connects the toes to the heel bone (plantar fascia) becomes irritated.  The main symptom of this condition is heel pain that may be worse after exercising too much or standing still for a long time.  Treatment varies, but it usually starts with rest, ice, compression, and elevation (RICE therapy) and over-the-counter medicines to manage pain. This information is not intended to replace advice given to you by your health care provider. Make sure you discuss any questions you have with your health care provider. Document Released: 10/16/2000 Document Revised: 01/03/2017 Document Reviewed: 11/18/2016 Elsevier Patient Education  2020 Elsevier Inc.   Preventive Care 28-71 Years Old, Male Preventive care refers to lifestyle choices and visits with your health care provider that can promote health and wellness. This includes:  A yearly physical exam. This is also called an annual well check.  Regular dental and eye exams.  Immunizations.  Screening for certain conditions.  Healthy lifestyle choices, such as eating a healthy diet, getting regular exercise, not using drugs or products that contain nicotine and tobacco, and limiting alcohol use. What can I expect for my preventive care visit? Physical exam Your health care provider will check:  Height and weight. These may be used to calculate body mass index (BMI), which is a measurement that tells if you are at a healthy weight.  Heart rate and blood pressure.  Your skin for abnormal spots. Counseling Your health care provider may ask you questions about:  Alcohol, tobacco, and drug use.  Emotional well-being.  Home and relationship well-being.   Sexual activity.  Eating habits.  Work and work Statistician. What immunizations do I need?  Influenza (flu) vaccine  This is recommended every year. Tetanus, diphtheria, and pertussis (Tdap) vaccine  You may need a Td booster every 10 years. Varicella (chickenpox) vaccine  You may need this vaccine if you have not already been vaccinated. Zoster (shingles) vaccine  You may need this after age 22. Measles, mumps, and rubella (MMR) vaccine  You may need at least one dose of MMR if you were born in 1957 or later. You may also need a second dose. Pneumococcal conjugate (PCV13) vaccine  You may need this if you have certain conditions and were not previously vaccinated. Pneumococcal polysaccharide (PPSV23) vaccine  You may need one or two doses if you smoke cigarettes or if you have certain conditions. Meningococcal conjugate (MenACWY) vaccine  You may need this if you have certain  conditions. Hepatitis A vaccine  You may need this if you have certain conditions or if you travel or work in places where you may be exposed to hepatitis A. Hepatitis B vaccine  You may need this if you have certain conditions or if you travel or work in places where you may be exposed to hepatitis B. Haemophilus influenzae type b (Hib) vaccine  You may need this if you have certain risk factors. Human papillomavirus (HPV) vaccine  If recommended by your health care provider, you may need three doses over 6 months. You may receive vaccines as individual doses or as more than one vaccine together in one shot (combination vaccines). Talk with your health care provider about the risks and benefits of combination vaccines. What tests do I need? Blood tests  Lipid and cholesterol levels. These may be checked every 5 years, or more frequently if you are over 58 years old.  Hepatitis C test.  Hepatitis B test. Screening  Lung cancer screening. You may have this screening every year starting at  age 49 if you have a 30-pack-year history of smoking and currently smoke or have quit within the past 15 years.  Prostate cancer screening. Recommendations will vary depending on your family history and other risks.  Colorectal cancer screening. All adults should have this screening starting at age 46 and continuing until age 46. Your health care provider may recommend screening at age 74 if you are at increased risk. You will have tests every 1-10 years, depending on your results and the type of screening test.  Diabetes screening. This is done by checking your blood sugar (glucose) after you have not eaten for a while (fasting). You may have this done every 1-3 years.  Sexually transmitted disease (STD) testing. Follow these instructions at home: Eating and drinking  Eat a diet that includes fresh fruits and vegetables, whole grains, lean protein, and low-fat dairy products.  Take vitamin and mineral supplements as recommended by your health care provider.  Do not drink alcohol if your health care provider tells you not to drink.  If you drink alcohol: ? Limit how much you have to 0-2 drinks a day. ? Be aware of how much alcohol is in your drink. In the [place] one drink equals one 12 oz bottle of beer (355 mL), one 5 oz glass of wine (148 mL), or one 1 oz glass of hard liquor (44 mL). Lifestyle  Take daily care of your teeth and gums.  Stay active. Exercise for at least 30 minutes on 5 or more days each week.  Do not use any products that contain nicotine or tobacco, such as cigarettes, e-cigarettes, and chewing tobacco. If you need help quitting, ask your health care provider.  If you are sexually active, practice safe sex. Use a condom or other form of protection to prevent STIs (sexually transmitted infections).  Talk with your health care provider about taking a low-dose aspirin every day starting at age 20. What's next?  Go to your health care provider once a year for a well  check visit.  Ask your health care provider how often you should have your eyes and teeth checked.  Stay up to date on all vaccines. This information is not intended to replace advice given to you by your health care provider. Make sure you discuss any questions you have with your health care provider. Document Released: 02/17/2015 Document Revised: 01/15/2018 Document Reviewed: 01/15/2018 Elsevier Patient Education  2020 [employer].

## 2018-08-14 ENCOUNTER — Encounter: Payer: Self-pay | Admitting: Physician Assistant

## 2018-08-19 ENCOUNTER — Encounter: Payer: Self-pay | Admitting: Physician Assistant

## 2018-08-21 ENCOUNTER — Telehealth: Payer: Self-pay

## 2018-08-21 MED ORDER — SILDENAFIL CITRATE 100 MG PO TABS: 50.0000 mg | ORAL_TABLET | Freq: Every day | ORAL | 5 refills | Status: DC | PRN

## 2018-08-21 NOTE — Telephone Encounter (Signed)
Patient called in "demanding" a new prescription for VIAGRA. I informed patient that PCP is out of the office until Monday. Stated that he is going out of the state Monday and does not want to be without for an entire week. I advised patient that I would route note to PCP.

## 2018-08-21 NOTE — Telephone Encounter (Signed)
This has been taken care of. Message sent via [place].

## 2018-08-21 NOTE — Addendum Note (Signed)
Addended by: Brunetta Jeans on: 08/21/2018 03:56 PM   Modules accepted: Orders

## 2018-08-31 ENCOUNTER — Encounter: Payer: Self-pay | Admitting: Physician Assistant

## 2018-08-31 ENCOUNTER — Telehealth: Payer: Self-pay | Admitting: Physician Assistant

## 2018-08-31 ENCOUNTER — Other Ambulatory Visit: Payer: Self-pay | Admitting: Physician Assistant

## 2018-08-31 DIAGNOSIS — Z209 Contact with and (suspected) exposure to unspecified communicable disease: Secondary | ICD-10-CM

## 2018-08-31 MED ORDER — ALPRAZOLAM 1 MG PO TABS: 1.0000 mg | ORAL_TABLET | Freq: Three times a day (TID) | ORAL | 1 refills | Status: DC | PRN

## 2018-08-31 MED ORDER — ALPRAZOLAM 1 MG PO TABS: ORAL_TABLET | ORAL | 1 refills | Status: DC

## 2018-08-31 MED FILL — ALPRAZolam 1 MG TABS: 1 | 30 days supply | Qty: 90 | Fill #0

## 2018-08-31 NOTE — Telephone Encounter (Signed)
Please advise 

## 2018-08-31 NOTE — Telephone Encounter (Signed)
Medication refilled

## 2018-08-31 NOTE — Telephone Encounter (Signed)
Handled via My Chart

## 2018-08-31 NOTE — Telephone Encounter (Signed)
Medication: ALPRAZolam Duanne Moron) 1 MG tablet [[id number]]    Pharmacy:  [place], [place] - [place] [phone] (Phone) [phone] (Fax)

## 2018-08-31 NOTE — Telephone Encounter (Signed)
Quantity 90. 1 tablet up to TID PRN.

## 2018-08-31 NOTE — Telephone Encounter (Signed)
[name] is calling from [place] is calling because there is two sets of directions on the ALPRAZolam William [name]) 1 MG tablet [[id number]]   Wanting accurate directions please advise  [phone]

## 2018-08-31 NOTE — Telephone Encounter (Signed)
Pharmacy has been given prescription instructions per PCP.

## 2018-08-31 NOTE — Addendum Note (Signed)
Addended by: Brunetta Jeans on: 08/31/2018 01:04 PM   Modules accepted: Orders

## 2018-08-31 NOTE — Telephone Encounter (Signed)
Schedule a virtual visit to discuss options with pt?    Copied from [place] [id number]. Topic: General - Other >> Aug 31, 2018  7:14 AM Rayann Heman wrote: Reason for CRM: pt called and stated that he discussed getting a covid test because he has been out of town. Pt would like an order please

## 2018-08-31 NOTE — Telephone Encounter (Signed)
No visit needed. This was previously discussed at his last visit. He will need to decide where he wants to go by reviewing testing information at [email or web address]. Order for test has been placed.

## 2018-09-01 ENCOUNTER — Other Ambulatory Visit: Payer: Self-pay

## 2018-09-01 DIAGNOSIS — R6889 Other general symptoms and signs: Secondary | ICD-10-CM | POA: Diagnosis not present

## 2018-09-01 DIAGNOSIS — Z20822 Contact with and (suspected) exposure to covid-19: Secondary | ICD-10-CM

## 2018-09-03 ENCOUNTER — Other Ambulatory Visit: Payer: Self-pay | Admitting: Physician Assistant

## 2018-09-03 LAB — NOVEL CORONAVIRUS, NAA: SARS-CoV-2, NAA: NOT DETECTED

## 2018-09-03 MED FILL — ATORVASTATIN 40 MG TABLET: 40 | 30 days supply | Qty: 30 | Fill #2

## 2018-09-03 MED FILL — LISINOPRIL-HCTZ 10-12.5 MG: 10-12.5 | 90 days supply | Qty: 90 | Fill #0

## 2018-09-03 MED FILL — SERTRALINE HCL 100 MG TAB: 100 | 90 days supply | Qty: 180 | Fill #0

## 2018-09-03 MED FILL — TESTOSTERONE CYP 200 MG/ML: 200 | 15 days supply | Qty: 2 | Fill #1

## 2018-09-03 MED FILL — FENOFIBRATE 145 MG TABS: 145 | 30 days supply | Qty: 30 | Fill #2

## 2018-09-03 MED FILL — buPROPion HCL ER (XL) 300 M: 300 | 90 days supply | Qty: 90 | Fill #0

## 2018-09-09 MED FILL — PREVIDENT 5000 SENSITIVE PA: 1.1-5 | 30 days supply | Qty: 100 | Fill #0

## 2018-09-25 ENCOUNTER — Other Ambulatory Visit: Payer: Self-pay

## 2018-09-25 ENCOUNTER — Ambulatory Visit (INDEPENDENT_AMBULATORY_CARE_PROVIDER_SITE_OTHER): Payer: Federal, State, Local not specified - PPO | Admitting: *Deleted

## 2018-09-25 DIAGNOSIS — Z23 Encounter for immunization: Secondary | ICD-10-CM | POA: Diagnosis not present

## 2018-09-25 NOTE — Progress Notes (Signed)
Patient in today for flu vaccine  Vaccine given and patient tolerated w

## 2018-10-07 DIAGNOSIS — H903 Sensorineural hearing loss, bilateral: Secondary | ICD-10-CM | POA: Diagnosis not present

## 2018-10-07 MED FILL — ALPRAZolam 1 MG TABS: 1 | 30 days supply | Qty: 90 | Fill #1

## 2018-10-07 MED FILL — ATORVASTATIN 40 MG TABLET: 40 | 30 days supply | Qty: 30 | Fill #3

## 2018-10-07 MED FILL — FENOFIBRATE 145 MG TABS: 145 | 30 days supply | Qty: 30 | Fill #3

## 2018-10-07 MED FILL — TESTOSTERONE CYP 200 MG/ML: 200 | 15 days supply | Qty: 2 | Fill #2

## 2018-10-07 MED FILL — CELECOXIB 100 MG CAP: 100 | 90 days supply | Qty: 180 | Fill #0

## 2018-10-07 MED FILL — PREVIDENT 5000 SENSITIVE PA: 1.1-5 | 30 days supply | Qty: 100 | Fill #1

## 2018-10-26 ENCOUNTER — Encounter: Payer: Self-pay | Admitting: Physician Assistant

## 2018-10-26 DIAGNOSIS — M722 Plantar fascial fibromatosis: Secondary | ICD-10-CM

## 2018-10-28 DIAGNOSIS — Z23 Encounter for immunization: Secondary | ICD-10-CM | POA: Insufficient documentation

## 2018-10-29 ENCOUNTER — Other Ambulatory Visit: Payer: Self-pay

## 2018-10-29 ENCOUNTER — Ambulatory Visit (INDEPENDENT_AMBULATORY_CARE_PROVIDER_SITE_OTHER): Payer: Federal, State, Local not specified - PPO | Admitting: Podiatry

## 2018-10-29 ENCOUNTER — Encounter: Payer: Self-pay | Admitting: Podiatry

## 2018-10-29 ENCOUNTER — Ambulatory Visit (INDEPENDENT_AMBULATORY_CARE_PROVIDER_SITE_OTHER): Payer: Federal, State, Local not specified - PPO

## 2018-10-29 VITALS — BP 134/74

## 2018-10-29 DIAGNOSIS — M722 Plantar fascial fibromatosis: Secondary | ICD-10-CM | POA: Diagnosis not present

## 2018-10-29 DIAGNOSIS — L853 Xerosis cutis: Secondary | ICD-10-CM

## 2018-10-29 DIAGNOSIS — M7742 Metatarsalgia, left foot: Secondary | ICD-10-CM | POA: Diagnosis not present

## 2018-10-29 DIAGNOSIS — M7741 Metatarsalgia, right foot: Secondary | ICD-10-CM | POA: Diagnosis not present

## 2018-10-29 DIAGNOSIS — M79671 Pain in right foot: Secondary | ICD-10-CM

## 2018-10-29 DIAGNOSIS — B070 Plantar wart: Secondary | ICD-10-CM

## 2018-10-29 DIAGNOSIS — M79672 Pain in left foot: Secondary | ICD-10-CM

## 2018-10-29 MED ORDER — AMMONIUM LACTATE 12 % EX LOTN: 1.0000 "application " | TOPICAL_LOTION | CUTANEOUS | 0 refills | Status: DC | PRN

## 2018-10-29 NOTE — Progress Notes (Signed)
Subjective:  Patient ID: William [name], male    DOB: 1960-02-20,  MRN: [id number]  Chief Complaint  Patient presents with  . Foot Pain    pt has bil foot pain, located on the bottom of the plantar forefoot, up to the both of both of his heels, pt states that he is looking to get an injection in that area    58 y.o. male presents with the above complaint. Hx confirmed with patient.  Patient comes in with a complaint of bilateral plantar fasciitis pain is located on the bottom of both feet.  The pain has been going on since February.  Pain has been gradual.  Patient normally ambulates and dress shoes however I do recall that he has been ambulating in flip-flops.  Patient also has secondary complaint of plantar warts on the right proximal heel.  Patient also has a complaints of metatarsalgia on bilateral forefoot.   Review of Systems: Negative except as noted in the HPI. Denies N/V/F/Ch.  Past Medical History:  Diagnosis Date  . Adjustment disorder with mixed anxiety and depressed mood   . Arthritis   . Asthma    history  . Diabetes mellitus    type 2  . Elevated blood pressure    history of high blood pressure readings  . History of chicken pox   . Hypercholesteremia   . Hypertension   . Internal hemorrhoids   . Tubular adenoma of colon     Current Outpatient Medications:  .  ALPRAZolam (XANAX) 1 MG tablet, Take 1 tablet (1 mg total) by mouth 3 (three) times daily as needed for anxiety. 1 tab PO up to BID PRN, Disp: 90 tablet, Rfl: 1 .  atorvastatin (LIPITOR) 40 MG tablet, TAKE 1 TABLET BY MOUTH DAILY, Disp: 30 tablet, Rfl: 5 .  Blood Glucose Monitoring Suppl (ACCU-CHEK COMPACT CARE KIT) KIT, Check blood sugar twice daily. Dx:E11.9, Disp: 1 each, Rfl: 0 .  buPROPion (WELLBUTRIN XL) 300 MG 24 hr tablet, TAKE 1 TABLET BY MOUTH DAILY., Disp: 90 tablet, Rfl: 1 .  celecoxib (CELEBREX) 100 MG capsule, TAKE 1 CAPSULE (100 MG TOTAL) BY MOUTH 2 (TWO) TIMES DAILY., Disp: 180 capsule, Rfl: 1  .  fenofibrate (TRICOR) 145 MG tablet, , Disp: , Rfl:  .  glucose blood (ACCU-CHEK COMPACT PLUS) test strip, Check blood sugars twice daily. Dx:E11.9, Disp: 100 each, Rfl: 12 .  Insulin Glargine (BASAGLAR KWIKPEN) 100 UNIT/ML SOPN, Inject 1 mL (100 Units total) into the skin 2 (two) times daily., Disp: 120 mL, Rfl: 2 .  Insulin Pen Needle (BD ULTRA-FINE PEN NEEDLES) 29G X 12.7MM MISC, USE AS DIRECTED TO INJECT BASAGLAR, Disp: 100 each, Rfl: 3 .  lisinopril-hydrochlorothiazide (ZESTORETIC) 10-12.5 MG tablet, TAKE 1 TABLET BY MOUTH DAILY., Disp: 90 tablet, Rfl: 1 .  ONE TOUCH LANCETS MISC, Use as directed, Disp: 200 each, Rfl: 11 .  OZEMPIC, 0.25 OR 0.5 MG/DOSE, 2 MG/1.5ML SOPN, INJECT 0.5 MG INTO THE SKIN ONCE A WEEK. OVER DUE FOR DIABETES CHECK. PLEASE SCHEDULE APPOINTMENT FOR MORE REFILLS, Disp: 1.5 mL, Rfl: 3 .  PREVIDENT 5000 SENSITIVE 1.1-5 % PSTE, , Disp: , Rfl:  .  sertraline (ZOLOFT) 100 MG tablet, TAKE 1 TABLET BY MOUTH TWICE DAILY, Disp: 180 tablet, Rfl: 1 .  sildenafil (VIAGRA) 100 MG tablet, Take 0.5-1 tablets (50-100 mg total) by mouth daily as needed for erectile dysfunction., Disp: 30 tablet, Rfl: 5 .  SYNJARDY XR 06-998 MG TB24, TAKE 2 TABLETS BY MOUTH DAILY. OVER  DUE FOR DIABETES CHECK. PLEASE SCHEDULE APPOINTMENT FOR MORE REFILLS, Disp: 60 tablet, Rfl: 3 .  testosterone cypionate (DEPOTESTOSTERONE CYPIONATE) 200 MG/ML injection, INJECT 0.5 MLS (100 MG TOTAL) INTO THE MUSCLE ONCE A WEEK., Disp: 2 mL, Rfl: 3 .  fluticasone (FLONASE) 50 MCG/ACT nasal spray, Place 2 sprays into both nostrils daily., Disp: 16 g, Rfl: 6 .  ondansetron (ZOFRAN-ODT) 4 MG disintegrating tablet, Take 1 tablet (4 mg total) by mouth every 8 (eight) hours as needed for nausea or vomiting., Disp: 20 tablet, Rfl: 0  Social History   Tobacco Use  Smoking Status Never Smoker  Smokeless Tobacco Never Used    No Known Allergies Objective:   Vitals:   10/29/18 1416  BP: 134/74   There is no height or  weight on file to calculate BMI. Constitutional no acute distress, alert/oriented x3 and appropriate mood and affect  Vascular Dorsalis pedis pulse: present Posterior tibial pulse: present and  Hair pattern: normal Warmth: no warmth  Neurologic Epicritic sensations intact  Dermatologic Hyperkeratotic lesion noted on the plantar medial aspect of the heel consistent with plantar verruca with pin point bleeding.    Orthopedic: tenderness noted across the bottom both forefoot.  There is also Barbier xerosis noted on the plantar aspect of bilateral feet.  There is a pain along the medial tubercle of the calcaneus upon palpation to bilateral feet consistent with plantar fasciitis. Normal joint ROM without pain or crepitus bilaterally. No visible deformities. Tender to palpation at the calcaneal tuber bilaterally. No pain with calcaneal squeeze bilaterally. Ankle ROM diminished range of motion bilaterally. Silfverskiold Test: positive bilaterally.    Radiographs: AP and lateral views of skeletally mature.  Heel spurs noted to posterior and plantar calcaneal tuber. Mild arthritis to the dorsal midfoot.  Assessment:   1. Bilateral plantar fasciitis   2. Bilateral foot pain   3. Verruca plantaris   4. Metatarsalgia of both feet    Plan:  Patient was evaluated and treated and all questions answered.  Bilateral plantar fasciitis   -Radiographs reviewed . See details above.   -See note below for injections   - Patient refused plantar fascial brace  Verruca Plantaris  --Lesion was debrided today without complications. Hemostasis was achieved and the area was cleaned. Cantharone was applied followed by an occlusive bandage. Post procedure complications were discussed. Monitor for signs or symptoms of infection and directed to call the office mainly should any occur.  Metatarsalgia of both feet   -- Metatarsal pads were dispensed x 2 Xerosis to bilateral plantar feet  -- Amlactin 12% was  dispensed to the pharmacy    Procedure: Injection Tendon/Ligament Location: Bilateral plantar fascia at the glabrous junction; medial approach. Skin Prep: alcohol Injectate: 0.5 cc 0.5% marcaine plain, 0.5 cc of 1% Lidocaine, 0.5 cc kenalog 10. Disposition: Patient tolerated procedure well. Injection site dressed with a band-aid.   Return in about 3 weeks (around 11/19/2018).

## 2018-10-29 NOTE — Patient Instructions (Signed)

## 2018-11-04 ENCOUNTER — Encounter: Payer: Self-pay | Admitting: Physician Assistant

## 2018-11-04 DIAGNOSIS — F329 Major depressive disorder, single episode, unspecified: Secondary | ICD-10-CM

## 2018-11-04 DIAGNOSIS — F419 Anxiety disorder, unspecified: Secondary | ICD-10-CM

## 2018-11-04 DIAGNOSIS — E119 Type 2 diabetes mellitus without complications: Secondary | ICD-10-CM

## 2018-11-04 DIAGNOSIS — I10 Essential (primary) hypertension: Secondary | ICD-10-CM

## 2018-11-04 DIAGNOSIS — F32A Depression, unspecified: Secondary | ICD-10-CM

## 2018-11-04 DIAGNOSIS — E785 Hyperlipidemia, unspecified: Secondary | ICD-10-CM

## 2018-11-05 MED ORDER — CELECOXIB 100 MG PO CAPS: 100.0000 mg | ORAL_CAPSULE | Freq: Two times a day (BID) | ORAL | 1 refills | Status: DC

## 2018-11-05 MED ORDER — OZEMPIC (0.25 OR 0.5 MG/DOSE) 2 MG/1.5ML ~~LOC~~ SOPN: 0.5000 mg | PEN_INJECTOR | SUBCUTANEOUS | 6 refills | Status: DC

## 2018-11-05 MED ORDER — ATORVASTATIN CALCIUM 40 MG PO TABS: 40.0000 mg | ORAL_TABLET | Freq: Every day | ORAL | 1 refills | Status: DC

## 2018-11-05 MED ORDER — SERTRALINE HCL 100 MG PO TABS: 100.0000 mg | ORAL_TABLET | Freq: Two times a day (BID) | ORAL | 1 refills | Status: DC

## 2018-11-05 MED ORDER — SYNJARDY XR 5-1000 MG PO TB24: 2.0000 | ORAL_TABLET | Freq: Every day | ORAL | 1 refills | Status: DC

## 2018-11-05 MED ORDER — BUPROPION HCL ER (XL) 300 MG PO TB24: 300.0000 mg | ORAL_TABLET | Freq: Every day | ORAL | 1 refills | Status: DC

## 2018-11-05 MED ORDER — ALPRAZOLAM 1 MG PO TABS: 1.0000 mg | ORAL_TABLET | Freq: Three times a day (TID) | ORAL | 1 refills | Status: DC | PRN

## 2018-11-05 MED ORDER — FENOFIBRATE 145 MG PO TABS: 145.0000 mg | ORAL_TABLET | Freq: Every day | ORAL | 1 refills | Status: DC

## 2018-11-05 MED ORDER — LISINOPRIL-HYDROCHLOROTHIAZIDE 10-12.5 MG PO TABS: 1.0000 | ORAL_TABLET | Freq: Every day | ORAL | 1 refills | Status: DC

## 2018-11-05 MED FILL — ALPRAZolam 1 MG TABS: 1 | 30 days supply | Qty: 90 | Fill #0

## 2018-11-05 MED FILL — OZEMPIC 0.25 OR 0.5 MG/DOSE: 2 | 30 days supply | Qty: 2 | Fill #0

## 2018-11-05 MED FILL — ATORVASTATIN 40 MG TABLET: 40 | 90 days supply | Qty: 90 | Fill #0

## 2018-11-05 MED FILL — FENOFIBRATE 145 MG TAB: 145 | 90 days supply | Qty: 90 | Fill #0

## 2018-11-05 NOTE — Telephone Encounter (Signed)
Sent!

## 2018-11-05 NOTE — Telephone Encounter (Signed)
Xanax last rx 08/31/18 #90 1 RF LOV: 08/13/18 CPE CSC: 06/23/17 Xanax

## 2018-11-09 MED FILL — SYNJARDY XR 5-1,000 MG TAB: 5-1000 | 90 days supply | Qty: 180 | Fill #0

## 2018-11-10 MED FILL — TESTOSTERONE CYP 200 MG/ML: 200 | 15 days supply | Qty: 2 | Fill #3

## 2018-11-13 ENCOUNTER — Encounter: Payer: Self-pay | Admitting: Physician Assistant

## 2018-11-13 DIAGNOSIS — H9113 Presbycusis, bilateral: Secondary | ICD-10-CM | POA: Insufficient documentation

## 2018-12-01 ENCOUNTER — Ambulatory Visit: Payer: Federal, State, Local not specified - PPO

## 2018-12-02 ENCOUNTER — Other Ambulatory Visit: Payer: Self-pay

## 2018-12-02 ENCOUNTER — Ambulatory Visit (INDEPENDENT_AMBULATORY_CARE_PROVIDER_SITE_OTHER): Payer: Federal, State, Local not specified - PPO

## 2018-12-02 ENCOUNTER — Other Ambulatory Visit: Payer: Self-pay | Admitting: Physician Assistant

## 2018-12-02 DIAGNOSIS — E291 Testicular hypofunction: Secondary | ICD-10-CM

## 2018-12-02 DIAGNOSIS — Z23 Encounter for immunization: Secondary | ICD-10-CM

## 2018-12-02 MED FILL — SERTRALINE HCL 100 MG TABS: 100 | 90 days supply | Qty: 180 | Fill #1

## 2018-12-02 MED FILL — BUPROPION HCL XL 300 MG TAB: 300 | 90 days supply | Qty: 90 | Fill #1

## 2018-12-02 MED FILL — ALPRAZolam 1 MG TABS: 1 | 30 days supply | Qty: 90 | Fill #1

## 2018-12-02 MED FILL — LISINOPRIL-HCTZ 10-12.5 MG: 10-12.5 | 90 days supply | Qty: 90 | Fill #1

## 2018-12-02 MED FILL — PREVIDENT 5000 SENSITIVE PA: 1.1-5 | 30 days supply | Qty: 100 | Fill #2

## 2018-12-02 MED FILL — OZEMPIC 0.25 OR 0.5 MG/DOSE: 2 | 30 days supply | Qty: 2 | Fill #1

## 2018-12-03 MED FILL — TESTOSTERONE CYP 200 MG/ML: 200 | 14 days supply | Qty: 2 | Fill #0

## 2018-12-03 NOTE — Telephone Encounter (Signed)
Testosterone request. Last rx 08/11/18 41ml RF 3 LOV: 08/13/18 CPE PSA: normal range.

## 2018-12-25 ENCOUNTER — Encounter: Payer: Self-pay | Admitting: Physician Assistant

## 2018-12-29 ENCOUNTER — Other Ambulatory Visit: Payer: Self-pay

## 2018-12-29 DIAGNOSIS — Z20822 Contact with and (suspected) exposure to covid-19: Secondary | ICD-10-CM

## 2018-12-31 LAB — NOVEL CORONAVIRUS, NAA: SARS-CoV-2, NAA: NOT DETECTED

## 2019-01-05 ENCOUNTER — Other Ambulatory Visit: Payer: Self-pay | Admitting: Physician Assistant

## 2019-01-05 MED FILL — TESTOSTERONE CYP 200 MG/ML: 200 | 14 days supply | Qty: 2 | Fill #1

## 2019-01-05 MED FILL — OZEMPIC 0.25 OR 0.5 MG/DOSE: 2 | 30 days supply | Qty: 2 | Fill #2

## 2019-01-05 MED FILL — BASAGLAR 100 UNIT/ML KWIKPE: 100 | 60 days supply | Qty: 120 | Fill #1

## 2019-01-05 NOTE — Telephone Encounter (Signed)
Xanax last rx 11/05/18 #90 1 RF LOV: 08/13/18 CPE CSC: 06/23/17

## 2019-01-06 MED FILL — ALPRAZolam 1 MG TABS: 1 | 30 days supply | Qty: 90 | Fill #0

## 2019-01-19 ENCOUNTER — Encounter: Payer: Self-pay | Admitting: Physician Assistant

## 2019-01-24 ENCOUNTER — Encounter: Payer: Self-pay | Admitting: Physician Assistant

## 2019-01-26 MED FILL — PREVIDENT 5000 SENSITIVE PA: 1.1-5 | 30 days supply | Qty: 100 | Fill #0

## 2019-02-01 ENCOUNTER — Encounter: Payer: Self-pay | Admitting: Physician Assistant

## 2019-02-02 DIAGNOSIS — H40013 Open angle with borderline findings, low risk, bilateral: Secondary | ICD-10-CM | POA: Diagnosis not present

## 2019-02-02 DIAGNOSIS — H43391 Other vitreous opacities, right eye: Secondary | ICD-10-CM | POA: Diagnosis not present

## 2019-02-02 DIAGNOSIS — H04123 Dry eye syndrome of bilateral lacrimal glands: Secondary | ICD-10-CM | POA: Diagnosis not present

## 2019-02-02 DIAGNOSIS — H2513 Age-related nuclear cataract, bilateral: Secondary | ICD-10-CM | POA: Diagnosis not present

## 2019-02-08 MED FILL — FENOFIBRATE 145 MG TABS: 145 | 90 days supply | Qty: 90 | Fill #1

## 2019-02-08 MED FILL — SYNJARDY XR 5-1,000 MG TAB: 5-1000 | 90 days supply | Qty: 180 | Fill #1

## 2019-02-08 MED FILL — OZEMPIC 0.25 OR 0.5 MG/DOSE: 2 | 30 days supply | Qty: 2 | Fill #3

## 2019-02-08 MED FILL — ATORVASTATIN 40 MG TABLET: 40 | 90 days supply | Qty: 90 | Fill #1

## 2019-02-08 MED FILL — ALPRAZolam 1 MG TABS: 1 | 30 days supply | Qty: 90 | Fill #1

## 2019-02-10 ENCOUNTER — Telehealth: Payer: Self-pay | Admitting: Emergency Medicine

## 2019-02-10 NOTE — Telephone Encounter (Signed)
Patient insurance [employer] approved prior authorization for Testosterone 267mc/ml frin 01/11/19-02/10/2020.

## 2019-02-11 MED FILL — TESTOSTERONE CYP 200 MG/ML: 200 | 14 days supply | Qty: 2 | Fill #2

## 2019-03-08 ENCOUNTER — Other Ambulatory Visit: Payer: Self-pay | Admitting: Physician Assistant

## 2019-03-08 MED FILL — ALPRAZolam 1 MG TABS: 1 | 90 days supply | Qty: 90 | Fill #0

## 2019-03-08 MED FILL — TESTOSTERONE CYP 200 MG/ML: 200 | 14 days supply | Qty: 2 | Fill #3

## 2019-03-08 MED FILL — LISINOPRIL-HCTZ 10-12.5 MG: 10-12.5 | 90 days supply | Qty: 90 | Fill #0

## 2019-03-08 MED FILL — SERTRALINE HCL 100 MG TABS: 100 | 90 days supply | Qty: 180 | Fill #0

## 2019-03-08 MED FILL — BUPROPION HCL XL 300 MG TAB: 300 | 90 days supply | Qty: 90 | Fill #0

## 2019-03-08 MED FILL — OZEMPIC 0.25 OR 0.5 MG/DOSE: 2 | 30 days supply | Qty: 2 | Fill #4

## 2019-03-08 MED FILL — PREVIDENT 5000 SENSITIVE PA: 1.1-5 | 30 days supply | Qty: 100 | Fill #1

## 2019-03-08 NOTE — Telephone Encounter (Signed)
Refill Rx Xanax 1mg  #90 with 1 RF Last refilled on 01/06/2019 Last OV on 08/13/2018

## 2019-03-22 ENCOUNTER — Encounter: Payer: Self-pay | Admitting: Physician Assistant

## 2019-04-06 ENCOUNTER — Other Ambulatory Visit: Payer: Self-pay | Admitting: Physician Assistant

## 2019-04-06 DIAGNOSIS — E291 Testicular hypofunction: Secondary | ICD-10-CM

## 2019-04-06 MED FILL — TESTOSTERONE CYP 200 MG/ML: 200 | 14 days supply | Qty: 2 | Fill #0

## 2019-04-06 MED FILL — OZEMPIC 0.25 OR 0.5 MG/DOSE: 2 | 30 days supply | Qty: 2 | Fill #5

## 2019-04-06 MED FILL — ALPRAZolam 1 MG TABS: 1 | 30 days supply | Qty: 90 | Fill #0

## 2019-04-06 NOTE — Telephone Encounter (Signed)
Xanax last rx 03/08/19 #90 Testosterone last rx 12/03/18 CSC: 06/23/17 LOV: 08/13/18 CPE Last PSA-08/13/18

## 2019-04-19 ENCOUNTER — Encounter: Payer: Self-pay | Admitting: Physician Assistant

## 2019-04-27 MED FILL — PREVIDENT 5000 SENSITIVE PA: 1.1-5 | 30 days supply | Qty: 100 | Fill #2

## 2019-05-07 ENCOUNTER — Other Ambulatory Visit: Payer: Self-pay | Admitting: Physician Assistant

## 2019-05-07 DIAGNOSIS — E291 Testicular hypofunction: Secondary | ICD-10-CM

## 2019-05-07 MED FILL — OZEMPIC 0.25 OR 0.5 MG/DOSE: 2 | 30 days supply | Qty: 2 | Fill #6

## 2019-05-10 ENCOUNTER — Other Ambulatory Visit: Payer: Self-pay | Admitting: Physician Assistant

## 2019-05-10 MED FILL — ALPRAZolam 1 MG TABS: 1 | 30 days supply | Qty: 90 | Fill #0

## 2019-05-10 MED FILL — TESTOSTERONE CYP 200 MG/ML: 200 | 14 days supply | Qty: 2 | Fill #0

## 2019-05-10 NOTE — Telephone Encounter (Signed)
Xanax last rx 04/06/19 #90 Testosterone last rx 04/06/19 LOV: 08/13/18 CPE Last psa-08/13/18

## 2019-05-11 ENCOUNTER — Encounter: Payer: Self-pay | Admitting: Emergency Medicine

## 2019-05-12 ENCOUNTER — Other Ambulatory Visit: Payer: Self-pay

## 2019-05-12 ENCOUNTER — Ambulatory Visit: Payer: Federal, State, Local not specified - PPO | Admitting: Podiatry

## 2019-05-12 DIAGNOSIS — M722 Plantar fascial fibromatosis: Secondary | ICD-10-CM

## 2019-05-12 DIAGNOSIS — L600 Ingrowing nail: Secondary | ICD-10-CM

## 2019-05-12 DIAGNOSIS — B070 Plantar wart: Secondary | ICD-10-CM | POA: Diagnosis not present

## 2019-05-12 DIAGNOSIS — M79671 Pain in right foot: Secondary | ICD-10-CM

## 2019-05-12 DIAGNOSIS — M7742 Metatarsalgia, left foot: Secondary | ICD-10-CM | POA: Diagnosis not present

## 2019-05-12 DIAGNOSIS — M79672 Pain in left foot: Secondary | ICD-10-CM

## 2019-05-12 DIAGNOSIS — L03031 Cellulitis of right toe: Secondary | ICD-10-CM | POA: Diagnosis not present

## 2019-05-12 DIAGNOSIS — M7741 Metatarsalgia, right foot: Secondary | ICD-10-CM | POA: Diagnosis not present

## 2019-05-12 MED ORDER — DOXYCYCLINE HYCLATE 100 MG PO TABS: 100.0000 mg | ORAL_TABLET | Freq: Two times a day (BID) | ORAL | 0 refills | Status: DC

## 2019-05-12 MED FILL — DOXYCYCLINE HYCLATE 100 MG: 100 | 10 days supply | Qty: 20 | Fill #0

## 2019-05-14 ENCOUNTER — Encounter: Payer: Self-pay | Admitting: Podiatry

## 2019-05-14 NOTE — Progress Notes (Signed)
Subjective:  Patient ID: William [name], male    DOB: April 06, 1960,  MRN: [id number]  Chief Complaint  Patient presents with  . Plantar Fasciitis    pt is here for a f/u of plantar fasciitis of both feet.    59 y.o. male presents with the above complaint. Hx confirmed with patient.  Patient presents with right medial border of the fourth digit ingrown.  Patient states that this has been going on for quite some time since last week.  There is purulent drainage and very tender.  He wanted to get it evaluated make sure that the infection is not bad because he is diabetic with last A1c of 7.1.  He said that there is mild erythema around it.  He has secondary complaint of bilateral plantar fasciitis.  He has been treated in the past for it.  He states the injection gives him a lot of relief and therefore is requesting injection.  He states that this started back up again couple of months ago has progressively gotten worse.  He has done stretching exercise which has not helped.  He also has brace that I told him to place himself back into it. He also has tertiary complaint of right heel wart that has come back up as well.  He was treated in the past by me with [facility] therapy.  He is requesting another application of Cantharone.  He was doing well after my first injection and decided not to return to see me.    Review of Systems: Negative except as noted in the HPI. Denies N/V/F/Ch.  Past Medical History:  Diagnosis Date  . Adjustment disorder with mixed anxiety and depressed mood   . Arthritis   . Asthma    history  . Diabetes mellitus    type 2  . Elevated blood pressure    history of high blood pressure readings  . History of chicken pox   . Hypercholesteremia   . Hypertension   . Internal hemorrhoids   . Tubular adenoma of colon     Current Outpatient Medications:  .  ALPRAZolam (XANAX) 1 MG tablet, TAKE 1 TABLET BY MOUTH THREE TIMES DAILY AS NEEDED FOR ANXIETY, Disp: 90 tablet, Rfl:  0 .  ammonium lactate (AMLACTIN) 12 % lotion, Apply 1 application topically as needed for dry skin., Disp: 400 g, Rfl: 0 .  atorvastatin (LIPITOR) 40 MG tablet, Take 1 tablet (40 mg total) by mouth daily., Disp: 90 tablet, Rfl: 1 .  Blood Glucose Monitoring Suppl (ACCU-CHEK COMPACT CARE KIT) KIT, Check blood sugar twice daily. Dx:E11.9, Disp: 1 each, Rfl: 0 .  buPROPion (WELLBUTRIN XL) 300 MG 24 hr tablet, Take 1 tablet (300 mg total) by mouth daily., Disp: 90 tablet, Rfl: 1 .  celecoxib (CELEBREX) 100 MG capsule, Take 1 capsule (100 mg total) by mouth 2 (two) times daily., Disp: 180 capsule, Rfl: 1 .  Empagliflozin-metFORMIN HCl ER (SYNJARDY XR) 06-998 MG TB24, Take 2 tablets by mouth daily., Disp: 180 tablet, Rfl: 1 .  fenofibrate (TRICOR) 145 MG tablet, Take 1 tablet (145 mg total) by mouth daily., Disp: 90 tablet, Rfl: 1 .  fluticasone (FLONASE) 50 MCG/ACT nasal spray, Place 2 sprays into both nostrils daily., Disp: 16 g, Rfl: 6 .  glucose blood (ACCU-CHEK COMPACT PLUS) test strip, Check blood sugars twice daily. Dx:E11.9, Disp: 100 each, Rfl: 12 .  Insulin Glargine (BASAGLAR KWIKPEN) 100 UNIT/ML SOPN, Inject 1 mL (100 Units total) into the skin 2 (two) times daily.,  Disp: 120 mL, Rfl: 2 .  Insulin Pen Needle (BD ULTRA-FINE PEN NEEDLES) 29G X 12.7MM MISC, USE AS DIRECTED TO INJECT BASAGLAR, Disp: 100 each, Rfl: 3 .  lisinopril-hydrochlorothiazide (ZESTORETIC) 10-12.5 MG tablet, Take 1 tablet by mouth daily., Disp: 90 tablet, Rfl: 1 .  ondansetron (ZOFRAN-ODT) 4 MG disintegrating tablet, Take 1 tablet (4 mg total) by mouth every 8 (eight) hours as needed for nausea or vomiting., Disp: 20 tablet, Rfl: 0 .  ONE TOUCH LANCETS MISC, Use as directed, Disp: 200 each, Rfl: 11 .  PREVIDENT 5000 SENSITIVE 1.1-5 % PSTE, , Disp: , Rfl:  .  Semaglutide,0.25 or 0.5MG/DOS, (OZEMPIC, 0.25 OR 0.5 MG/DOSE,) 2 MG/1.5ML SOPN, Inject 0.5 mg into the skin once a week., Disp: 1.5 mL, Rfl: 6 .  sertraline (ZOLOFT) 100  MG tablet, Take 1 tablet (100 mg total) by mouth 2 (two) times daily., Disp: 180 tablet, Rfl: 1 .  sildenafil (VIAGRA) 100 MG tablet, Take 0.5-1 tablets (50-100 mg total) by mouth daily as needed for erectile dysfunction., Disp: 30 tablet, Rfl: 5 .  testosterone cypionate (DEPOTESTOSTERONE CYPIONATE) 200 MG/ML injection, INJECT 0.5 MLS (100 MG TOTAL) INTO THE MUSCLE ONCE A WEEK., Disp: 2 mL, Rfl: 0 .  doxycycline (VIBRA-TABS) 100 MG tablet, Take 1 tablet (100 mg total) by mouth 2 (two) times daily., Disp: 20 tablet, Rfl: 0  Social History   Tobacco Use  Smoking Status Never Smoker  Smokeless Tobacco Never Used    No Known Allergies Objective:   There were no vitals filed for this visit. There is no height or weight on file to calculate BMI. Constitutional no acute distress, alert/oriented x3 and appropriate mood and affect  Vascular Dorsalis pedis pulse: present Posterior tibial pulse: present and  Hair pattern: normal Warmth: no warmth  Neurologic Epicritic sensations intact  Dermatologic Hyperkeratotic lesion noted on the plantar medial aspect of the heel consistent with plantar verruca with pin point bleeding.   Painful ingrowing nail at medial nail borders of the fourth digit nail right.   Orthopedic: tenderness noted across the bottom both forefoot.  There is also Barbier xerosis noted on the plantar aspect of bilateral feet.  There is a pain along the medial tubercle of the calcaneus upon palpation to bilateral feet consistent with plantar fasciitis. Normal joint ROM without pain or crepitus bilaterally. No visible deformities. Tender to palpation at the calcaneal tuber bilaterally. No pain with calcaneal squeeze bilaterally. Ankle ROM diminished range of motion bilaterally. Silfverskiold Test: positive bilaterally.    Radiographs: None Assessment:   No diagnosis found. Plan:  Patient was evaluated and treated and all questions answered.  Bilateral plantar fasciitis    -Radiographs reviewed . See details above.   -See note below for injections   -Second injection was delivered as described below.  However there has been a long loss of care in between injections.  I discussed this with the patient and I would like to see him back again in 4 weeks to assess properly.  Patient states he has some sort of a brace at home that he will he loves and would love to place himself back into the brace.  Verruca Plantaris  --Lesion was debrided today without complications. Hemostasis was achieved and the area was cleaned. Cantharone was applied followed by an occlusive bandage. Post procedure complications were discussed. Monitor for signs or symptoms of infection and directed to call the office mainly should any occur. -Second application was done.  Metatarsalgia of both feet   --  Metatarsal pads were dispensed x 2 Xerosis to bilateral plantar feet  -- Amlactin 12% was dispensed to the pharmacy    Procedure: Injection Tendon/Ligament Location: Bilateral plantar fascia at the glabrous junction; medial approach. Skin Prep: alcohol Injectate: 0.5 cc 0.5% marcaine plain, 0.5 cc of 1% Lidocaine, 0.5 cc kenalog 10. Disposition: Patient tolerated procedure well. Injection site dressed with a band-aid.   Ingrown Nail, right -Patient elects to proceed with minor surgery to remove ingrown toenail removal today. Consent reviewed and signed by patient. -Ingrown nail excised. See procedure note. -Educated on post-procedure care including soaking. Written instructions provided and reviewed. -Patient to follow up in 2 weeks for nail check. -Doxycycline was dispensed for skin and soft tissue prophylaxis  Procedure: Excision of Ingrown Toenail Location: Right 4th toe medial nail borders. Anesthesia: Lidocaine 1% plain; 1.5 mL and Marcaine 0.5% plain; 1.5 mL, digital block. Skin Prep: Betadine. Dressing: Silvadene; telfa; dry, sterile, compression dressing. Technique: Following  skin prep, the toe was exsanguinated and a tourniquet was secured at the base of the toe. The affected nail border was freed, split with a nail splitter, and excised. Chemical matrixectomy was then performed with phenol and irrigated out with alcohol. The tourniquet was then removed and sterile dressing applied. Disposition: Patient tolerated procedure well. Patient to return in 2 weeks for follow-up.   A total of 28 minutes was spent in direct patient care as well as pre and post patient encounter activities.  This includes documentation as well as reviewing patient chart for labs, imaging, past medical, surgical, social, and family history as documented in the EMR.  I have reviewed medication allergies as documented in EMR.  I discussed the etiology of condition and treatment options from conservative to surgical care.  All risks and benefit of the treatment course was discussed in detail.  All questions were answered and return appointment was discussed.  Since the visit completed in an ambulatory/outpatient setting, the patient and/or parent/guardian has been advised to contact the providers office for worsening condition and seek medical treatment and/or call 911 if the patient deems either is necessary.   No follow-ups on file.   No follow-ups on file.

## 2019-05-15 ENCOUNTER — Emergency Department
Admission: EM | Admit: 2019-05-15 | Discharge: 2019-05-15 | Disposition: A | Discharge status: Home / Self Care | Payer: Federal, State, Local not specified - PPO | Source: Home / Self Care | Attending: Family Medicine | Admitting: Family Medicine

## 2019-05-15 ENCOUNTER — Other Ambulatory Visit: Payer: Self-pay

## 2019-05-15 ENCOUNTER — Encounter: Payer: Self-pay | Admitting: Emergency Medicine

## 2019-05-15 DIAGNOSIS — S61215A Laceration without foreign body of left ring finger without damage to nail, initial encounter: Secondary | ICD-10-CM | POA: Diagnosis not present

## 2019-05-15 NOTE — Discharge Instructions (Addendum)
Change dressing daily and apply Bacitracin ointment to wound.  Keep wound clean and dry.  Return for any signs of infection (or follow-up with family doctor):  Increasing redness, swelling, pain, heat, drainage, etc. °Return in 10 days for suture removal.   °

## 2019-05-15 NOTE — ED Triage Notes (Signed)
Patient cut his left 4th finger while working on his car today.  Patient unsure of Tdap injection.

## 2019-05-15 NOTE — ED Provider Notes (Signed)
[facility]    CSN: [id number] Arrival date & time: 05/15/19  1344      History   Chief Complaint Chief Complaint  Patient presents with  . Laceration    HPI William [name] is a 59 y.o. male.   Patient lacerated his left 4th fingertip while working on his car today.  Patient unsure of Tdap injection.   Laceration Location:  Finger Finger laceration location:  L ring finger Length:  1.5cm Depth:  Through dermis Quality: straight   Bleeding: controlled   Time since incident:  3 hours Laceration mechanism:  Metal edge Pain details:    Quality:  Dull   Severity:  Mild   Progression:  Unchanged Foreign body present:  No foreign bodies Worsened by:  Movement Tetanus status:  Out of date Associated symptoms: no focal weakness, no numbness and no swelling     Past Medical History:  Diagnosis Date  . Adjustment disorder with mixed anxiety and depressed mood   . Arthritis   . Asthma    history  . Diabetes mellitus    type 2  . Elevated blood pressure    history of high blood pressure readings  . History of chicken pox   . Hypercholesteremia   . Hypertension   . Internal hemorrhoids   . Tubular adenoma of colon     Patient Active Problem List   Diagnosis Date Noted  . Presbycusis of both ears 11/13/2018  . Need for shingles vaccine 10/28/2018  . Need for immunization against influenza 10/31/2017  . 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 Prostate cancer screening 07/04/2017  . Anxiety and depression 12/11/2016  . Class 1 obesity with serious comorbidity and body mass index (BMI) of 31.0 to 31.9 in adult 03/04/2016  . Visit for preventive health examination 10/24/2015  . Erectile dysfunction 01/31/2013  . Hypogonadism in male 07/21/2012  . Diabetes mellitus ([place]) 11/24/2009  . Hyperlipemia 11/24/2009  . Essential hypertension 11/24/2009    Past  Surgical History:  Procedure Laterality Date  . ELBOW SURGERY     right elbow 2010, left elbow 08/06/10 [name])  . KNEE SURGERY     right knee new ACL- 1993,2000  . KNEE SURGERY     left knee 2003  . NASAL SEPTUM SURGERY     1992  . PARTIAL HIP ARTHROPLASTY     right hip replacement       Home Medications    Prior to Admission medications   Medication Sig Start Date End Date Taking? Authorizing Provider  ALPRAZolam Duanne Moron) 1 MG tablet TAKE 1 TABLET BY MOUTH THREE TIMES DAILY AS NEEDED FOR ANXIETY 05/10/19   Brunetta Jeans, PA-C  ammonium lactate (AMLACTIN) 12 % lotion Apply 1 application topically as needed for dry skin. 10/29/18   Felipa Furnace, DPM  atorvastatin (LIPITOR) 40 MG tablet Take 1 tablet (40 mg total) by mouth daily. 11/05/18   Brunetta Jeans, PA-C  Blood Glucose Monitoring Suppl (ACCU-CHEK COMPACT CARE KIT) KIT Check blood sugar twice daily. Dx:E11.9 11/03/17   Brunetta Jeans, PA-C  buPROPion (WELLBUTRIN XL) 300 MG 24 hr tablet Take 1 tablet (300 mg total) by mouth daily. 11/05/18   Brunetta Jeans, PA-C  celecoxib (CELEBREX) 100 MG capsule Take 1 capsule (100 mg total) by mouth 2 (two) times daily. 11/05/18   Brunetta Jeans, PA-C  doxycycline (VIBRA-TABS) 100 MG tablet  Take 1 tablet (100 mg total) by mouth 2 (two) times daily. 05/12/19   Felipa Furnace, DPM  Empagliflozin-metFORMIN HCl ER (SYNJARDY XR) 06-998 MG TB24 Take 2 tablets by mouth daily. 11/05/18   Brunetta Jeans, PA-C  fenofibrate (TRICOR) 145 MG tablet Take 1 tablet (145 mg total) by mouth daily. 11/05/18   Brunetta Jeans, PA-C  fluticasone (FLONASE) 50 MCG/ACT nasal spray Place 2 sprays into both nostrils daily. 04/04/17   Brunetta Jeans, PA-C  glucose blood (ACCU-CHEK COMPACT PLUS) test strip Check blood sugars twice daily. Dx:E11.9 11/03/17   Brunetta Jeans, PA-C  Insulin Glargine (BASAGLAR KWIKPEN) 100 UNIT/ML SOPN Inject 1 mL (100 Units total) into the skin 2 (two) times daily.  06/05/18   Brunetta Jeans, PA-C  Insulin Pen Needle (BD ULTRA-FINE PEN NEEDLES) 29G X 12.7MM MISC USE AS DIRECTED TO INJECT BASAGLAR 12/30/17   Brunetta Jeans, PA-C  lisinopril-hydrochlorothiazide (ZESTORETIC) 10-12.5 MG tablet Take 1 tablet by mouth daily. 11/05/18   Brunetta Jeans, PA-C  ondansetron (ZOFRAN-ODT) 4 MG disintegrating tablet Take 1 tablet (4 mg total) by mouth every 8 (eight) hours as needed for nausea or vomiting. 12/24/17   Brunetta Jeans, PA-C  ONE TOUCH LANCETS MISC Use as directed 10/26/12   Debbrah Alar, NP  PREVIDENT 5000 SENSITIVE 1.1-5 % PSTE  10/07/18   [provider]  Semaglutide,0.25 or 0.5MG/DOS, (OZEMPIC, 0.25 OR 0.5 MG/DOSE,) 2 MG/1.5ML SOPN Inject 0.5 mg into the skin once a week. 11/05/18   Brunetta Jeans, PA-C  sertraline (ZOLOFT) 100 MG tablet Take 1 tablet (100 mg total) by mouth 2 (two) times daily. 11/05/18   Brunetta Jeans, PA-C  sildenafil (VIAGRA) 100 MG tablet Take 0.5-1 tablets (50-100 mg total) by mouth daily as needed for erectile dysfunction. 08/21/18   Brunetta Jeans, PA-C  testosterone cypionate (DEPOTESTOSTERONE CYPIONATE) 200 MG/ML injection INJECT 0.5 MLS (100 MG TOTAL) INTO THE MUSCLE ONCE A WEEK. 05/10/19   Brunetta Jeans, PA-C    Family History Family History  Problem Relation Age of Onset  . Diabetes Mother   . Thyroid disease Mother        Questionable  . Heart disease Father        deceased  . Other Neg Hx        hypogonadism  . Colon cancer Neg Hx     Social History Social History   Tobacco Use  . Smoking status: Never Smoker  . Smokeless tobacco: Never Used  Substance Use Topics  . Alcohol use: Yes    Alcohol/week: 7.0 - 14.0 standard drinks    Types: 7 - 14 Glasses of wine per week  . Drug use: No     Allergies   Patient has no known allergies.   Review of Systems Review of Systems  Neurological: Negative for focal weakness.  All other systems reviewed and are  negative.    Physical Exam Triage Vital Signs ED Triage Vitals  Enc Vitals Group     BP 05/15/19 1355 114/78     Pulse Rate 05/15/19 1355 (!) 102     Resp --      Temp 05/15/19 1355 98.2 F (36.8 C)     Temp Source 05/15/19 1355 Oral     SpO2 05/15/19 1355 95 %     Weight --      Height --      Head Circumference --      Peak Flow --  Pain Score 05/15/19 1359 0     Pain Loc --      Pain Edu? --      Excl. in [place]? --    No data found.  Updated Vital Signs BP 114/78 (BP Location: Left Arm)   Pulse (!) 102   Temp 98.2 F (36.8 C) (Oral)   SpO2 95%   Visual Acuity Right Eye Distance:   Left Eye Distance:   Bilateral Distance:    Right Eye Near:   Left Eye Near:    Bilateral Near:     Physical Exam Vitals and nursing note reviewed.  Constitutional:      General: He is not in acute distress. HENT:     Head: Atraumatic.  Eyes:     Pupils: Pupils are equal, round, and reactive to light.  Pulmonary:     Effort: Pulmonary effort is normal.  Musculoskeletal:     Left hand: Laceration present. Normal range of motion. Normal sensation. There is no disruption of two-point discrimination. Normal capillary refill.       Hands:     Comments: Let 4th fingertip volar surface has a 1.5cm curved simple laceration as noted on diagram.   Skin:    General: Skin is warm and dry.  Neurological:     Mental Status: He is alert.      UC Treatments / Results  Labs (all labs ordered are listed, but only abnormal results are displayed) Labs Reviewed - No data to display  EKG   Radiology No results found.  Procedures Procedures  Laceration Repair Discussed benefits and risks of procedure and verbal consent obtained. Using sterile technique and digital anesthesia with 1% lidocaine without epinephrine, cleansed wound with Betadine followed by copious lavage with normal saline.  Wound carefully inspected for debris and foreign bodies; none found.  Wound closed with #4,  4-0 interrupted nylon sutures.  Bacitracin and non-stick sterile dressing applied.  Wound precautions explained to patient.  Return for suture removal in 10 days.   Medications Ordered in UC Medications - No data to display  Initial Impression / Assessment and Plan / UC Course  I have reviewed the triage vital signs and the nursing notes.  Pertinent labs & imaging results that were available during my care of the patient were reviewed by me and considered in my medical decision making (see chart for details).       Final Clinical Impressions(s) / UC Diagnoses   Final diagnoses:  Laceration of left ring finger without foreign body without damage to nail, initial encounter     Discharge Instructions     Change dressing daily and apply Bacitracin ointment to wound.  Keep wound clean and dry.  Return for any signs of infection (or follow-up with family doctor):  Increasing redness, swelling, pain, heat, drainage, etc. Return in 10 days for suture removal.      ED Prescriptions    None        Kandra Nicolas, MD 05/20/19 [phone]

## 2019-05-17 ENCOUNTER — Ambulatory Visit: Payer: Federal, State, Local not specified - PPO | Admitting: Podiatry

## 2019-06-07 ENCOUNTER — Other Ambulatory Visit: Payer: Self-pay | Admitting: Physician Assistant

## 2019-06-07 DIAGNOSIS — E785 Hyperlipidemia, unspecified: Secondary | ICD-10-CM

## 2019-06-07 DIAGNOSIS — E291 Testicular hypofunction: Secondary | ICD-10-CM

## 2019-06-07 MED FILL — ATORVASTATIN 40 MG TABLET: 40 | 90 days supply | Qty: 90 | Fill #0

## 2019-06-07 MED FILL — FENOFIBRATE 145 MG TABS: 145 | 90 days supply | Qty: 90 | Fill #0

## 2019-06-07 MED FILL — OZEMPIC 0.25 OR 0.5 MG/DOSE: 2 | 30 days supply | Qty: 2 | Fill #1

## 2019-06-07 MED FILL — PREVIDENT 5000 SENSITIVE PA: 1.1-5 | 30 days supply | Qty: 100 | Fill #3

## 2019-06-07 MED FILL — buPROPion HCL ER (XL) 300 M: 300 | 90 days supply | Qty: 90 | Fill #1

## 2019-06-07 MED FILL — LISINOPRIL-HCTZ 10-12.5 MG: 10-12.5 | 90 days supply | Qty: 90 | Fill #1

## 2019-06-07 MED FILL — SYNJARDY XR 5-1,000 MG TAB: 5-1000 | 30 days supply | Qty: 60 | Fill #1

## 2019-06-07 MED FILL — SERTRALINE HCL 100 MG TABS: 100 | 90 days supply | Qty: 180 | Fill #1

## 2019-06-07 NOTE — Telephone Encounter (Signed)
Xanax last rx 05/10/19 #90 Testosterone last rx 05/10/19  LOV: 08/13/18 CPE CSC: 06/20/17

## 2019-06-08 ENCOUNTER — Other Ambulatory Visit: Payer: Self-pay

## 2019-06-08 ENCOUNTER — Telehealth: Payer: Self-pay | Admitting: General Practice

## 2019-06-08 DIAGNOSIS — E119 Type 2 diabetes mellitus without complications: Secondary | ICD-10-CM

## 2019-06-08 DIAGNOSIS — R7989 Other specified abnormal findings of blood chemistry: Secondary | ICD-10-CM

## 2019-06-08 NOTE — Telephone Encounter (Signed)
Can you please take care of this?

## 2019-06-08 NOTE — Telephone Encounter (Signed)
I am fine with the patient having a lab appointment, but he needs to be aware that insurance may not pay ahead of his appointment.  If he is okay with this, okay to place orders for testosterone, PSA, CBC (low testosterone), CMP, A1c, urine microalbumin and lipid profile (diabetes)

## 2019-06-08 NOTE — Telephone Encounter (Signed)
Patient already scheduled for labs. Orders placed.

## 2019-06-08 NOTE — Telephone Encounter (Signed)
Patient called in and scheduled a DM follow up with PCP on Friday at 3:30pm. He was adamant that he have a lab appointment scheduled tomorrow morning at [facility] in [place]. He would like to have Testosterone, A1C, Cholesterol and any other labs you feel necessary checked before his appt with PCP on Friday.

## 2019-06-09 ENCOUNTER — Other Ambulatory Visit: Payer: Federal, State, Local not specified - PPO

## 2019-06-09 ENCOUNTER — Other Ambulatory Visit: Payer: Self-pay

## 2019-06-09 ENCOUNTER — Other Ambulatory Visit (INDEPENDENT_AMBULATORY_CARE_PROVIDER_SITE_OTHER): Payer: Federal, State, Local not specified - PPO

## 2019-06-09 DIAGNOSIS — R7989 Other specified abnormal findings of blood chemistry: Secondary | ICD-10-CM | POA: Diagnosis not present

## 2019-06-09 DIAGNOSIS — E119 Type 2 diabetes mellitus without complications: Secondary | ICD-10-CM

## 2019-06-09 LAB — LIPID PANEL
Cholesterol: 174 mg/dL (ref 0–200)
HDL: 28.7 mg/dL — ABNORMAL LOW (ref >39.00)
NonHDL: 145.64
Total CHOL/HDL Ratio: 6
Triglycerides: 244 mg/dL — ABNORMAL HIGH (ref 0.0–149.0)
VLDL: 48.8 mg/dL — ABNORMAL HIGH (ref 0.0–40.0)

## 2019-06-09 LAB — COMPREHENSIVE METABOLIC PANEL
ALT: 28 U/L (ref 0–53)
AST: 18 U/L (ref 0–37)
Albumin: 4.5 g/dL (ref 3.5–5.2)
Alkaline Phosphatase: 65 U/L (ref 39–117)
BUN: 23 mg/dL (ref 6–23)
CO2: 27 mEq/L (ref 19–32)
Calcium: 9.1 mg/dL (ref 8.4–10.5)
Chloride: 101 mEq/L (ref 96–112)
Creatinine, Ser: 0.95 mg/dL (ref 0.40–1.50)
GFR: 80.6 mL/min (ref >60.00)
Glucose, Bld: 172 mg/dL — ABNORMAL HIGH (ref 70–99)
Potassium: 3.9 mEq/L (ref 3.5–5.1)
Sodium: 137 mEq/L (ref 135–145)
Total Bilirubin: 0.5 mg/dL (ref 0.2–1.2)
Total Protein: 6.9 g/dL (ref 6.0–8.3)

## 2019-06-09 LAB — CBC WITH DIFFERENTIAL/PLATELET
Basophils Absolute: 0.1 10*3/uL (ref 0.0–0.1)
Basophils Relative: 1 % (ref 0.0–3.0)
Eosinophils Absolute: 0.2 10*3/uL (ref 0.0–0.7)
Eosinophils Relative: 2.3 % (ref 0.0–5.0)
HCT: 41.6 % (ref 39.0–52.0)
Hemoglobin: 14.4 g/dL (ref 13.0–17.0)
Lymphocytes Relative: 32.9 % (ref 12.0–46.0)
Lymphs Abs: 2.2 10*3/uL (ref 0.7–4.0)
MCHC: 34.6 g/dL (ref 30.0–36.0)
MCV: 86.1 fl (ref 78.0–100.0)
Monocytes Absolute: 0.5 10*3/uL (ref 0.1–1.0)
Monocytes Relative: 7.4 % (ref 3.0–12.0)
Neutro Abs: 3.7 10*3/uL (ref 1.4–7.7)
Neutrophils Relative %: 56.4 % (ref 43.0–77.0)
Platelets: 166 10*3/uL (ref 150.0–400.0)
RBC: 4.83 Mil/uL (ref 4.22–5.81)
RDW: 14.6 % (ref 11.5–15.5)
WBC: 6.6 10*3/uL (ref 4.0–10.5)

## 2019-06-09 LAB — MICROALBUMIN / CREATININE URINE RATIO
Creatinine,U: 50.7 mg/dL
Microalb Creat Ratio: 1.4 mg/g (ref 0.0–30.0)
Microalb, Ur: <0.7 mg/dL (ref 0.0–1.9)

## 2019-06-09 LAB — TESTOSTERONE: Testosterone: 213.59 ng/dL — ABNORMAL LOW (ref 300.00–890.00)

## 2019-06-09 LAB — PSA: PSA: 0.26 ng/mL (ref 0.10–4.00)

## 2019-06-09 LAB — LDL CHOLESTEROL, DIRECT: Direct LDL: 102 mg/dL

## 2019-06-09 LAB — HEMOGLOBIN A1C: Hgb A1c MFr Bld: 8 % — ABNORMAL HIGH (ref 4.6–6.5)

## 2019-06-11 ENCOUNTER — Encounter: Payer: Self-pay | Admitting: Physician Assistant

## 2019-06-11 ENCOUNTER — Other Ambulatory Visit: Payer: Self-pay

## 2019-06-11 ENCOUNTER — Telehealth (INDEPENDENT_AMBULATORY_CARE_PROVIDER_SITE_OTHER): Payer: Federal, State, Local not specified - PPO | Admitting: Physician Assistant

## 2019-06-11 DIAGNOSIS — E785 Hyperlipidemia, unspecified: Secondary | ICD-10-CM

## 2019-06-11 DIAGNOSIS — F329 Major depressive disorder, single episode, unspecified: Secondary | ICD-10-CM

## 2019-06-11 DIAGNOSIS — Z794 Long term (current) use of insulin: Secondary | ICD-10-CM

## 2019-06-11 DIAGNOSIS — E119 Type 2 diabetes mellitus without complications: Secondary | ICD-10-CM

## 2019-06-11 DIAGNOSIS — I10 Essential (primary) hypertension: Secondary | ICD-10-CM

## 2019-06-11 DIAGNOSIS — E291 Testicular hypofunction: Secondary | ICD-10-CM | POA: Diagnosis not present

## 2019-06-11 DIAGNOSIS — F419 Anxiety disorder, unspecified: Secondary | ICD-10-CM

## 2019-06-11 DIAGNOSIS — F32A Depression, unspecified: Secondary | ICD-10-CM

## 2019-06-11 NOTE — Progress Notes (Signed)
Virtual Visit via Video   I connected with patient on 07/05/19 at  3:30 PM EDT by a video enabled telemedicine application and verified that I am speaking with the correct person using two identifiers.  Location patient: Home Location provider: [facility], Office Persons participating in the virtual visit: Patient, Provider, [place] (Patina Moore)  I discussed the limitations of evaluation and management by telemedicine and the availability of in person appointments. The patient expressed understanding and agreed to proceed.  Subjective:   HPI:   Patient with history of uncontrolled diabetes mellitus type 2, hyperlipidemia, hypertension, low testosterone, and anxiety/depression presents via [facility] today to follow-up regarding chronic medical issues.  Patient has not been seen in 10 months and is 7 months overdue for diabetic follow-up.  Hypertension -- Patient is currently on a regimen of lisinopril-HCTZ 10-12.5 mg daily. Endorses taking medications as directed and still tolerating well. Patient denies chest pain, palpitations, lightheadedness, dizziness, vision changes or frequent headaches. Is trying to watch salt intake  BP Readings from Last 3 Encounters:  05/15/19 114/78  10/29/18 134/74  08/13/18 118/62   Hyperlipidemia Associated with DM2 -- Currently on fenofibrate 145 mg daily. Has previously declined statin.  Recent LDL checked and at 102 above goal (<70) for diabetic which has been discussed with patient  Diabetes mellitus type 2 -- longstanding history, uncontrolled. Patient is currently on a regimen of Ozempic, synjardy XR and Basaglar. 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 Is taking his Ozempic once weekly  And Synjardy XR once daily as directed. Is not checking glucose levels as directed. Notes Eye examination 02/2019 (Dr. Posey Pronto) and normal per his report (records requested). Foot exam up-to-date. Has just seen Podiatry 3 weeks ago. Patient is working very  hard on his diet, trying to always choose better options. Exercise is currently significantly limited 2/2 work schedule. Is using his weekends to catch up on sleep from the week.   ROS:   See pertinent positives and negatives per HPI.  Patient Active Problem List   Diagnosis Date Noted  . Presbycusis of both ears 11/13/2018  . Need for shingles vaccine 10/28/2018  . Need for immunization against influenza 10/31/2017  . 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 Prostate cancer screening 07/04/2017  . Anxiety and depression 12/11/2016  . Class 1 obesity with serious comorbidity and body mass index (BMI) of 31.0 to 31.9 in adult 03/04/2016  . Visit for preventive health examination 10/24/2015  . Erectile dysfunction 01/31/2013  . Hypogonadism in male 07/21/2012  . Diabetes mellitus ([place]) 11/24/2009  . Hyperlipemia 11/24/2009  . Essential hypertension 11/24/2009    Social History   Tobacco Use  . Smoking status: Never Smoker  . Smokeless tobacco: Never Used  Substance Use Topics  . Alcohol use: Yes    Alcohol/week: 7.0 - 14.0 standard drinks    Types: 7 - 14 Glasses of wine per week    Current Outpatient Medications:  .  ALPRAZolam (XANAX) 1 MG tablet, Take 1 tablet (1 mg total) by mouth 3 (three) times daily as needed. for anxiety, Disp: 90 tablet, Rfl: 0 .  ammonium lactate (AMLACTIN) 12 % lotion, Apply 1 application topically as needed for dry skin., Disp: 400 g, Rfl: 0 .  Blood Glucose Monitoring Suppl (ACCU-CHEK COMPACT CARE KIT) KIT, Check blood sugar twice daily. Dx:E11.9, Disp: 1 each, Rfl: 0 .  buPROPion (WELLBUTRIN XL) 300 MG 24  hr tablet, Take 1 tablet (300 mg total) by mouth daily., Disp: 90 tablet, Rfl: 1 .  celecoxib (CELEBREX) 100 MG capsule, Take 1 capsule (100 mg total) by mouth 2 (two) times daily., Disp: 180 capsule, Rfl: 1 .  doxycycline (VIBRA-TABS) 100 MG tablet,  Take 1 tablet (100 mg total) by mouth 2 (two) times daily., Disp: 20 tablet, Rfl: 0 .  Empagliflozin-metFORMIN HCl ER (SYNJARDY XR) 06-998 MG TB24, Take 2 tablets by mouth daily., Disp: 180 tablet, Rfl: 1 .  fenofibrate (TRICOR) 145 MG tablet, TAKE 1 TABLET BY MOUTH ONCE DAILY, Disp: 90 tablet, Rfl: 1 .  fluticasone (FLONASE) 50 MCG/ACT nasal spray, Place 2 sprays into both nostrils daily., Disp: 16 g, Rfl: 6 .  glucose blood (ACCU-CHEK COMPACT PLUS) test strip, Check blood sugars twice daily. Dx:E11.9, Disp: 100 each, Rfl: 12 .  Insulin Glargine (BASAGLAR KWIKPEN) 100 UNIT/ML SOPN, Inject 1 mL (100 Units total) into the skin 2 (two) times daily., Disp: 120 mL, Rfl: 2 .  Insulin Pen Needle (BD ULTRA-FINE PEN NEEDLES) 29G X 12.7MM MISC, USE AS DIRECTED TO INJECT BASAGLAR, Disp: 100 each, Rfl: 3 .  lisinopril-hydrochlorothiazide (ZESTORETIC) 10-12.5 MG tablet, Take 1 tablet by mouth daily., Disp: 90 tablet, Rfl: 1 .  ondansetron (ZOFRAN-ODT) 4 MG disintegrating tablet, Take 1 tablet (4 mg total) by mouth every 8 (eight) hours as needed for nausea or vomiting., Disp: 20 tablet, Rfl: 0 .  ONE TOUCH LANCETS MISC, Use as directed, Disp: 200 each, Rfl: 11 .  PREVIDENT 5000 SENSITIVE 1.1-5 % PSTE, , Disp: , Rfl:  .  Semaglutide,0.25 or 0.5MG/DOS, (OZEMPIC, 0.25 OR 0.5 MG/DOSE,) 2 MG/1.5ML SOPN, Inject 0.5 mg into the skin once a week., Disp: 1.5 mL, Rfl: 6 .  sertraline (ZOLOFT) 100 MG tablet, Take 1 tablet (100 mg total) by mouth 2 (two) times daily., Disp: 180 tablet, Rfl: 1 .  sildenafil (VIAGRA) 100 MG tablet, Take 0.5-1 tablets (50-100 mg total) by mouth daily as needed for erectile dysfunction., Disp: 30 tablet, Rfl: 5 .  testosterone cypionate (DEPOTESTOSTERONE CYPIONATE) 200 MG/ML injection, Inject 1 mL (200 mg) weekly x 3 weeks. The resume injecting 1 mL every 14 days., Disp: 2 mL, Rfl: 0 .  Continuous Blood Gluc Receiver (FREESTYLE LIBRE 14 DAY READER) DEVI, Use as directed to check glucose TID,  Disp: 1 each, Rfl: 0 .  Continuous Blood Gluc Sensor (FREESTYLE LIBRE 14 DAY SENSOR) MISC, Use as directed to check sugar TID. Change sensor every 14 days., Disp: 6 each, Rfl: 3 .  rosuvastatin (CRESTOR) 20 MG tablet, Take 1 tablet (20 mg total) by mouth daily., Disp: 30 tablet, Rfl: 3  No Known Allergies  Objective:   There were no vitals taken for this visit.  Patient is well-developed, well-nourished in no acute distress.  Resting comfortably at home.  Head is normocephalic, atraumatic.  No labored breathing.  Speech is clear and coherent with logical content.  Patient is alert and oriented at baseline.  Unable to complete foot exam via video visit today.  Assessment and Plan:   1. Type 2 diabetes mellitus without complication, with long-term current use of insulin (HCC) Uncontrolled with A1C at 8. Renal function stable. Continue ACEI. Foot exam UTD. Eye exam UTD (records requested). Discussed diet and exercise recommendations again with patient. Needs to be more consistent with his Basaglar dose. Discussed again the need to be consistent with CBGs. Order for [employer] placed to help him make it easier to be consistent. Will  have him check TID and record. Follow-up in 3-4 weeks for reassessment of these levels and to make further changes.   Lab Results  Component Value Date   HGBA1C 8.0 (H) 06/09/2019    - Continuous Blood Gluc Receiver (FREESTYLE LIBRE 14 DAY READER) DEVI; Use as directed to check glucose TID  Dispense: 1 each; Refill: 0 - Continuous Blood Gluc Sensor (FREESTYLE LIBRE 14 DAY SENSOR) MISC; Use as directed to check sugar TID. Change sensor every 14 days.  Dispense: 6 each; Refill: 3  2. Hypogonadism in male Testosterone low at recent check. Will increase to 200 mg once weekly x 3 weeks, then resuming 200 mg every 14 days. Repeat testosterone level and PSA in 4 weeks. If not improving would refer back to Urology or Endocrinology for further adjustments.  -  testosterone cypionate (DEPOTESTOSTERONE CYPIONATE) 200 MG/ML injection; Inject 1 mL (200 mg) weekly x 3 weeks. The resume injecting 1 mL every 14 days.  Dispense: 2 mL; Refill: 0  3. Hyperlipidemia, unspecified hyperlipidemia type Is agreeable to statin. Will start Atorvastatin 20 mg daily in addition to his current regimen of fenofibrate. Dietary and exercise recommendations reviewed with patient. Repeat fasting lipids and LFTs in 8 weeks.   4. Essential hypertension Doing well overall. Metabolic panel stable. Continue current regimen.   5. Anxiety and depression Stable. Continue current regimen.     Leeanne Rio, PA-C 07/05/2019

## 2019-06-13 MED ORDER — ALPRAZOLAM 1 MG PO TABS: 1.0000 mg | ORAL_TABLET | Freq: Three times a day (TID) | ORAL | 0 refills | Status: DC | PRN

## 2019-06-13 MED ORDER — FREESTYLE LIBRE 14 DAY READER DEVI: 0 refills | Status: DC

## 2019-06-13 MED ORDER — ROSUVASTATIN CALCIUM 20 MG PO TABS
20.0000 mg | ORAL_TABLET | Freq: Every day | ORAL | 3 refills | Status: DC
Start: 2019-06-13 — End: 2019-10-27

## 2019-06-13 MED ORDER — TESTOSTERONE CYPIONATE 200 MG/ML IM SOLN: INTRAMUSCULAR | 0 refills | Status: DC

## 2019-06-13 MED ORDER — FREESTYLE LIBRE 14 DAY SENSOR MISC: 3 refills | Status: DC

## 2019-07-06 ENCOUNTER — Other Ambulatory Visit: Payer: Self-pay | Admitting: Physician Assistant

## 2019-07-06 DIAGNOSIS — E291 Testicular hypofunction: Secondary | ICD-10-CM

## 2019-07-06 MED FILL — SYNJARDY XR 5-1,000 MG TAB: 5-1000 | 30 days supply | Qty: 60 | Fill #2

## 2019-07-06 MED FILL — PREVIDENT 5000 SENSITIVE PA: 1.1-5 | 30 days supply | Qty: 100 | Fill #4

## 2019-07-06 MED FILL — OZEMPIC 0.25 OR 0.5 MG/DOSE: 2 | 30 days supply | Qty: 2 | Fill #2

## 2019-07-07 NOTE — Telephone Encounter (Signed)
Testosterone last filled 06/13/19 45mL with no refills Last office visit 06/11/19

## 2019-07-08 ENCOUNTER — Other Ambulatory Visit: Payer: Self-pay | Admitting: Physician Assistant

## 2019-07-08 DIAGNOSIS — M546 Pain in thoracic spine: Secondary | ICD-10-CM | POA: Diagnosis not present

## 2019-07-08 DIAGNOSIS — M545 Low back pain: Secondary | ICD-10-CM | POA: Diagnosis not present

## 2019-07-08 DIAGNOSIS — M9902 Segmental and somatic dysfunction of thoracic region: Secondary | ICD-10-CM | POA: Diagnosis not present

## 2019-07-08 DIAGNOSIS — M9903 Segmental and somatic dysfunction of lumbar region: Secondary | ICD-10-CM | POA: Diagnosis not present

## 2019-07-08 MED FILL — TESTOSTERONE CYP 200 MG/ML: 200 | 14 days supply | Qty: 2 | Fill #0

## 2019-07-12 ENCOUNTER — Other Ambulatory Visit: Payer: Self-pay | Admitting: Physician Assistant

## 2019-07-20 MED FILL — TESTOSTERONE CYP 200 MG/ML: 200 | 14 days supply | Qty: 2 | Fill #1

## 2019-08-05 ENCOUNTER — Other Ambulatory Visit: Payer: Self-pay | Admitting: Physician Assistant

## 2019-08-05 DIAGNOSIS — E119 Type 2 diabetes mellitus without complications: Secondary | ICD-10-CM

## 2019-08-05 MED FILL — FREESTYLE LIBRE 14 DAY READ: 1 days supply | Qty: 1 | Fill #0

## 2019-08-05 MED FILL — OZEMPIC 0.25 OR 0.5 MG/DOSE: 2 | 30 days supply | Qty: 2 | Fill #0

## 2019-08-05 MED FILL — FREESTYLE LIBRE 14 DAY SENS: 84 days supply | Qty: 6 | Fill #0

## 2019-08-05 MED FILL — SYNJARDY XR 5-1,000 MG TAB: 5-1000 | 30 days supply | Qty: 60 | Fill #0

## 2019-08-06 MED FILL — TESTOSTERONE CYP 200 MG/ML: 200 | 14 days supply | Qty: 2 | Fill #2

## 2019-08-06 MED FILL — ROSUVASTATIN CALCIUM 20 MG: 20 | 30 days supply | Qty: 30 | Fill #2

## 2019-08-11 ENCOUNTER — Other Ambulatory Visit: Payer: Self-pay | Admitting: Physician Assistant

## 2019-08-11 MED FILL — ALPRAZolam 1 MG TABS: 1 | 30 days supply | Qty: 90 | Fill #0

## 2019-08-11 MED FILL — PREVIDENT 5000 SENSITIVE PA: 1.1-5 | 30 days supply | Qty: 100 | Fill #5

## 2019-08-11 NOTE — Telephone Encounter (Signed)
Xanax last rx 07/13/19 #90 tid LOV: 06/11/19 Diabetes, LOV: 08/13/18 Anxiety/Depression/CPE CSC: 06/20/17

## 2019-08-12 MED FILL — FREESTYLE LIBRE 14 DAY SENS: 84 days supply | Qty: 6 | Fill #0

## 2019-08-12 MED FILL — FREESTYLE LIBRE 14 DAY READ: 1 days supply | Qty: 1 | Fill #0

## 2019-08-12 MED FILL — SYNJARDY XR 5-1,000 MG TAB: 5-1000 | 30 days supply | Qty: 60 | Fill #0

## 2019-08-12 MED FILL — ROSUVASTATIN CALCIUM 20 MG: 20 | 30 days supply | Qty: 30 | Fill #2

## 2019-08-12 MED FILL — OZEMPIC 0.25 OR 0.5 MG/DOSE: 2 | 30 days supply | Qty: 2 | Fill #0

## 2019-08-12 MED FILL — TESTOSTERONE CYP 200 MG/ML: 200 | 14 days supply | Qty: 2 | Fill #2

## 2019-08-18 MED FILL — OZEMPIC 0.25 OR 0.5 MG/DOSE: 2 | 30 days supply | Qty: 2 | Fill #0

## 2019-08-18 MED FILL — ALPRAZolam 1 MG TABS: 1 | 30 days supply | Qty: 90 | Fill #0

## 2019-08-18 MED FILL — SYNJARDY XR 5-1,000 MG TAB: 5-1000 | 30 days supply | Qty: 60 | Fill #0

## 2019-08-18 MED FILL — FREESTYLE LIBRE 14 DAY READ: 1 days supply | Qty: 1 | Fill #0

## 2019-08-18 MED FILL — ROSUVASTATIN CALCIUM 20 MG: 20 | 30 days supply | Qty: 30 | Fill #2

## 2019-08-18 MED FILL — TESTOSTERONE CYP 200 MG/ML: 200 | 14 days supply | Qty: 2 | Fill #2

## 2019-08-18 MED FILL — PREVIDENT 5000 SENSITIVE PA: 1.1-5 | 30 days supply | Qty: 100 | Fill #5

## 2019-08-18 MED FILL — FREESTYLE LIBRE 14 DAY SENS: 84 days supply | Qty: 6 | Fill #0

## 2019-09-10 ENCOUNTER — Other Ambulatory Visit: Payer: Self-pay | Admitting: Physician Assistant

## 2019-09-10 ENCOUNTER — Encounter: Payer: Self-pay | Admitting: Emergency Medicine

## 2019-09-10 DIAGNOSIS — E119 Type 2 diabetes mellitus without complications: Secondary | ICD-10-CM

## 2019-09-10 MED FILL — SYNJARDY XR 5-1,000 MG TAB: 5-1000 | 30 days supply | Qty: 60 | Fill #1

## 2019-09-10 MED FILL — ROSUVASTATIN CALCIUM 20 MG: 20 | 30 days supply | Qty: 30 | Fill #3

## 2019-09-10 MED FILL — FENOFIBRATE 145 MG TABS: 145 | 90 days supply | Qty: 90 | Fill #1

## 2019-09-10 MED FILL — ATORVASTATIN CALCIUM 40 MG: 40 | 90 days supply | Qty: 90 | Fill #1

## 2019-09-10 MED FILL — BASAGLAR 100 UNIT/ML KWIKPE: 100 | 60 days supply | Qty: 120 | Fill #0

## 2019-09-10 MED FILL — TESTOSTERONE CYP 200 MG/ML: 200 | 14 days supply | Qty: 2 | Fill #3

## 2019-09-10 MED FILL — OZEMPIC 0.25 OR 0.5 MG/DOSE: 2 | 30 days supply | Qty: 2 | Fill #1

## 2019-09-14 ENCOUNTER — Other Ambulatory Visit: Payer: Self-pay | Admitting: Physician Assistant

## 2019-09-15 MED FILL — ALPRAZolam 1 MG TABS: 1 | 30 days supply | Qty: 90 | Fill #0

## 2019-09-15 NOTE — Telephone Encounter (Signed)
Xanax last rx 08/11/19 #90 LOV: 06/11/19 DM CSC: 06/23/17

## 2019-09-20 DIAGNOSIS — M546 Pain in thoracic spine: Secondary | ICD-10-CM | POA: Diagnosis not present

## 2019-09-20 DIAGNOSIS — M9902 Segmental and somatic dysfunction of thoracic region: Secondary | ICD-10-CM | POA: Diagnosis not present

## 2019-09-20 DIAGNOSIS — M545 Low back pain: Secondary | ICD-10-CM | POA: Diagnosis not present

## 2019-09-20 DIAGNOSIS — M9903 Segmental and somatic dysfunction of lumbar region: Secondary | ICD-10-CM | POA: Diagnosis not present

## 2019-09-23 DIAGNOSIS — M545 Low back pain: Secondary | ICD-10-CM | POA: Diagnosis not present

## 2019-09-23 DIAGNOSIS — M9902 Segmental and somatic dysfunction of thoracic region: Secondary | ICD-10-CM | POA: Diagnosis not present

## 2019-09-23 DIAGNOSIS — M546 Pain in thoracic spine: Secondary | ICD-10-CM | POA: Diagnosis not present

## 2019-09-23 DIAGNOSIS — M9903 Segmental and somatic dysfunction of lumbar region: Secondary | ICD-10-CM | POA: Diagnosis not present

## 2019-09-23 MED FILL — PREVIDENT 5000 SENSITIVE PA: 1.1-5 | 30 days supply | Qty: 100 | Fill #0

## 2019-09-23 MED FILL — ALPRAZolam 1 MG TABS: 1 | 30 days supply | Qty: 90 | Fill #0

## 2019-10-26 ENCOUNTER — Other Ambulatory Visit: Payer: Self-pay | Admitting: Physician Assistant

## 2019-10-26 DIAGNOSIS — E291 Testicular hypofunction: Secondary | ICD-10-CM

## 2019-10-26 DIAGNOSIS — I10 Essential (primary) hypertension: Secondary | ICD-10-CM

## 2019-10-26 MED FILL — OZEMPIC 0.25 OR 0.5 MG/DOSE: 2 | 30 days supply | Qty: 2 | Fill #2

## 2019-10-27 ENCOUNTER — Other Ambulatory Visit: Payer: Self-pay | Admitting: Physician Assistant

## 2019-10-27 MED FILL — ROSUVASTATIN CALCIUM 20 MG: 20 | 30 days supply | Qty: 30 | Fill #0

## 2019-10-27 MED FILL — ALPRAZolam 1 MG TABS: 1 | 30 days supply | Qty: 90 | Fill #0

## 2019-10-27 MED FILL — TESTOSTERONE CYP 200 MG/ML: 200 | 14 days supply | Qty: 2 | Fill #0

## 2019-10-27 MED FILL — LISINOPRIL-HCTZ 10-12.5 MG: 10-12.5 | 90 days supply | Qty: 90 | Fill #0

## 2019-10-27 NOTE — Telephone Encounter (Signed)
Alprazolam LFD 09/15/19 #90 with no refills Testosterone LFD 07/08/19 33mL with 3 refills LOV 06/11/19 NOV none

## 2019-10-28 MED FILL — FLUARIX QUADRIVALENT 0.5 ML: 0.5 | 1 days supply | Qty: 1 | Fill #0

## 2019-11-08 ENCOUNTER — Other Ambulatory Visit (HOSPITAL_BASED_OUTPATIENT_CLINIC_OR_DEPARTMENT_OTHER): Payer: Self-pay

## 2019-11-08 ENCOUNTER — Other Ambulatory Visit: Payer: Self-pay | Admitting: Physician Assistant

## 2019-11-08 DIAGNOSIS — F419 Anxiety disorder, unspecified: Secondary | ICD-10-CM

## 2019-11-08 DIAGNOSIS — F32A Depression, unspecified: Secondary | ICD-10-CM

## 2019-11-08 MED FILL — PREVIDENT 5000 SENSITIVE PA: 1.1-5 | 30 days supply | Qty: 100 | Fill #0

## 2019-11-10 ENCOUNTER — Other Ambulatory Visit: Payer: Self-pay | Admitting: Emergency Medicine

## 2019-11-10 DIAGNOSIS — F32A Depression, unspecified: Secondary | ICD-10-CM

## 2019-11-10 MED ORDER — BUPROPION HCL ER (XL) 300 MG PO TB24: 300.0000 mg | ORAL_TABLET | Freq: Every day | ORAL | 0 refills | Status: DC

## 2019-11-10 MED ORDER — SERTRALINE HCL 100 MG PO TABS: 100.0000 mg | ORAL_TABLET | Freq: Two times a day (BID) | ORAL | 0 refills | Status: DC

## 2019-11-10 MED FILL — SERTRALINE HCL 100 MG TABS: 100 | 30 days supply | Qty: 60 | Fill #0

## 2019-11-10 MED FILL — buPROPion HCL ER (XL) 300 M: 300 | 30 days supply | Qty: 30 | Fill #0

## 2019-12-03 ENCOUNTER — Other Ambulatory Visit: Payer: Self-pay | Admitting: Family Medicine

## 2019-12-03 ENCOUNTER — Other Ambulatory Visit: Payer: Self-pay | Admitting: Physician Assistant

## 2019-12-03 DIAGNOSIS — E291 Testicular hypofunction: Secondary | ICD-10-CM

## 2019-12-03 DIAGNOSIS — F32A Depression, unspecified: Secondary | ICD-10-CM

## 2019-12-03 MED FILL — ROSUVASTATIN CALCIUM 20 MG: 20 | 30 days supply | Qty: 30 | Fill #1

## 2019-12-03 MED FILL — OZEMPIC 0.25 OR 0.5 MG/DOSE: 2 | 30 days supply | Qty: 2 | Fill #3

## 2019-12-03 NOTE — Telephone Encounter (Signed)
LFD 10/27/19 #90 with no refills.  LOV 06/11/19 NOV none Patient needs f/u appt per your notes

## 2019-12-06 ENCOUNTER — Encounter: Payer: Self-pay | Admitting: Physician Assistant

## 2019-12-06 ENCOUNTER — Telehealth (INDEPENDENT_AMBULATORY_CARE_PROVIDER_SITE_OTHER): Payer: Federal, State, Local not specified - PPO | Admitting: Physician Assistant

## 2019-12-06 ENCOUNTER — Other Ambulatory Visit: Payer: Self-pay | Admitting: Physician Assistant

## 2019-12-06 ENCOUNTER — Other Ambulatory Visit: Payer: Self-pay

## 2019-12-06 ENCOUNTER — Other Ambulatory Visit: Payer: Self-pay | Admitting: Family Medicine

## 2019-12-06 VITALS — Ht 72.0 in | Wt 211.0 lb

## 2019-12-06 DIAGNOSIS — F32A Depression, unspecified: Secondary | ICD-10-CM

## 2019-12-06 DIAGNOSIS — E119 Type 2 diabetes mellitus without complications: Secondary | ICD-10-CM

## 2019-12-06 DIAGNOSIS — F419 Anxiety disorder, unspecified: Secondary | ICD-10-CM

## 2019-12-06 DIAGNOSIS — E113211 Type 2 diabetes mellitus with mild nonproliferative diabetic retinopathy with macular edema, right eye: Secondary | ICD-10-CM

## 2019-12-06 DIAGNOSIS — I10 Essential (primary) hypertension: Secondary | ICD-10-CM | POA: Diagnosis not present

## 2019-12-06 DIAGNOSIS — E785 Hyperlipidemia, unspecified: Secondary | ICD-10-CM

## 2019-12-06 DIAGNOSIS — N529 Male erectile dysfunction, unspecified: Secondary | ICD-10-CM

## 2019-12-06 DIAGNOSIS — E291 Testicular hypofunction: Secondary | ICD-10-CM

## 2019-12-06 DIAGNOSIS — Z794 Long term (current) use of insulin: Secondary | ICD-10-CM

## 2019-12-06 MED ORDER — SYNJARDY XR 5-1000 MG PO TB24: ORAL_TABLET | ORAL | 1 refills | Status: DC

## 2019-12-06 MED ORDER — SILDENAFIL CITRATE 100 MG PO TABS
50.0000 mg | ORAL_TABLET | Freq: Every day | ORAL | 5 refills | Status: DC | PRN
Start: 2019-12-06 — End: 2019-12-06

## 2019-12-06 MED ORDER — BUPROPION HCL ER (XL) 300 MG PO TB24: 300.0000 mg | ORAL_TABLET | Freq: Every day | ORAL | 0 refills | Status: DC

## 2019-12-06 MED ORDER — BASAGLAR KWIKPEN 100 UNIT/ML ~~LOC~~ SOPN: 100.0000 [IU] | PEN_INJECTOR | Freq: Two times a day (BID) | SUBCUTANEOUS | 2 refills | Status: DC

## 2019-12-06 MED ORDER — FENOFIBRATE 145 MG PO TABS: 145.0000 mg | ORAL_TABLET | Freq: Every day | ORAL | 1 refills | Status: DC

## 2019-12-06 MED ORDER — LISINOPRIL-HYDROCHLOROTHIAZIDE 10-12.5 MG PO TABS: 1.0000 | ORAL_TABLET | Freq: Every day | ORAL | 0 refills | Status: DC

## 2019-12-06 MED ORDER — SERTRALINE HCL 100 MG PO TABS: 100.0000 mg | ORAL_TABLET | Freq: Two times a day (BID) | ORAL | 1 refills | Status: DC

## 2019-12-06 MED ORDER — ROSUVASTATIN CALCIUM 20 MG PO TABS: 20.0000 mg | ORAL_TABLET | Freq: Every day | ORAL | 1 refills | Status: DC

## 2019-12-06 MED FILL — FENOFIBRATE 145 MG TABS: 145 | 90 days supply | Qty: 90 | Fill #0

## 2019-12-06 MED FILL — SILDENAFIL CITRATE 100 MG T: 100 | 30 days supply | Qty: 30 | Fill #0

## 2019-12-06 MED FILL — TESTOSTERONE CYP 200 MG/ML: 200 | 28 days supply | Qty: 2 | Fill #0

## 2019-12-06 MED FILL — buPROPion HCL ER (XL) 300 M: 300 | 90 days supply | Qty: 90 | Fill #0

## 2019-12-06 MED FILL — BASAGLAR 100 UNIT/ML KWIKPE: 100 | 60 days supply | Qty: 120 | Fill #0

## 2019-12-06 MED FILL — SERTRALINE HCL 100 MG TABS: 100 | 90 days supply | Qty: 180 | Fill #0

## 2019-12-06 NOTE — Progress Notes (Signed)
Virtual Visit via Video   I connected with patient on 12/06/19 at 11:00 AM EDT by a video enabled telemedicine application and verified that I am speaking with the correct person using two identifiers.  Location patient: Home Location provider: [facility], Office Persons participating in the virtual visit: Patient, Provider, [place] (Patina Moore)  I discussed the limitations of evaluation and management by telemedicine and the availability of in person appointments. The patient expressed understanding and agreed to proceed.  Subjective:   HPI:   Patient presents via [place] today for follow-up of chronic medical conditions.   Anxiety/Depression/Insomnia -- Patient is currently on a regimen of Zoloft 100 mg twice daily and Wellbutrin 300 mg daily.  Uses alprazolam at night on occasion to help with sleep.  Other times will use for acute panic attack as directed but notes this is a rare occasion.  Notes symptoms are well controlled on current regimen.  Denies suicidal thought or ideation.  Notes sleep is stable.   Diabetes Mellitus II --with history of diabetic neuropathy and nonproliferative retinopathy.  Patient has been extremely noncompliant with medication recommendations and other preventive parameters.  Patient does endorse having eye examination since last visit.  Was told everything was stable.  Records will need to be requested.  In regards to medications he endorses taking his Synjardy daily as directed and tolerating well.  Is also on Basaglar but cannot say the last time he remembered to take.  Has been given a freestyle libre continuous glucometer but has not been using.  Has not been checking glucose levels.  Endorses keeping a well-balanced diet.  Hypertension/HLD --patient currently on a regimen of rosuvastatin 20 mg daily, fenofibrate 145 mg daily, lisinopril hydrochlorothiazide 10-12.5 mg daily.  Endorses taking medications daily as directed and tolerating well.   Diet as noted above.  ROS:   See pertinent positives and negatives per HPI.  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 associated with type 2 diabetes mellitus ([place]) 08/12/2017   Prostate cancer screening 07/04/2017   Anxiety and depression 12/11/2016   Class 1 obesity with serious comorbidity and body mass index (BMI) of 31.0 to 31.9 in adult 03/04/2016   Visit for preventive health examination 10/24/2015   Erectile dysfunction 01/31/2013   Hypogonadism in male 07/21/2012   Diabetes mellitus ([place]) 11/24/2009   Hyperlipemia 11/24/2009   Essential hypertension 11/24/2009    Social History   Tobacco Use   Smoking status: Never Smoker   Smokeless tobacco: Never Used  Substance Use Topics   Alcohol use: Yes    Alcohol/week: 7.0 - 14.0 standard drinks    Types: 7 - 14 Glasses of wine per week    Current Outpatient Medications:    ALPRAZolam (XANAX) 1 MG tablet, TAKE 1 TABLET BY MOUTH THREE TIMES DAILY AS NEEDED FOR ANXIETY, Disp: 90 tablet, Rfl: 0   ammonium lactate (AMLACTIN) 12 % lotion, Apply 1 application topically as needed for dry skin., Disp: 400 g, Rfl: 0   Blood Glucose Monitoring Suppl (ACCU-CHEK COMPACT CARE KIT) KIT, Check blood sugar twice daily. Dx:E11.9, Disp: 1 each, Rfl: 0   buPROPion (WELLBUTRIN XL) 300 MG 24 hr tablet, Take 1 tablet (300 mg total) by mouth daily. Need diabetic follow up, Disp: 30 tablet, Rfl: 0  celecoxib (CELEBREX) 100 MG capsule, Take 1 capsule (100 mg total) by mouth 2 (two) times daily., Disp: 180 capsule, Rfl: 1   Continuous Blood Gluc Receiver (FREESTYLE LIBRE 14 DAY READER) DEVI, Use as directed to check glucose TID, Disp: 1 each, Rfl: 0   Continuous Blood Gluc Sensor (FREESTYLE LIBRE 14 DAY SENSOR)  MISC, Use as directed to check sugar TID. Change sensor every 14 days., Disp: 6 each, Rfl: 3   fenofibrate (TRICOR) 145 MG tablet, TAKE 1 TABLET BY MOUTH ONCE DAILY, Disp: 90 tablet, Rfl: 1   fluticasone (FLONASE) 50 MCG/ACT nasal spray, Place 2 sprays into both nostrils daily., Disp: 16 g, Rfl: 6   glucose blood (ACCU-CHEK COMPACT PLUS) test strip, Check blood sugars twice daily. Dx:E11.9, Disp: 100 each, Rfl: 12   Insulin Glargine (BASAGLAR KWIKPEN) 100 UNIT/ML, INJECT 1 ML (100 UNITS TOTAL) INTO THE SKIN 2 (TWO) TIMES DAILY., Disp: 120 mL, Rfl: 2   Insulin Pen Needle (BD ULTRA-FINE PEN NEEDLES) 29G X 12.7MM MISC, USE AS DIRECTED TO INJECT BASAGLAR, Disp: 100 each, Rfl: 3   lisinopril-hydrochlorothiazide (ZESTORETIC) 10-12.5 MG tablet, TAKE 1 TABLET BY MOUTH DAILY., Disp: 90 tablet, Rfl: 0   ondansetron (ZOFRAN-ODT) 4 MG disintegrating tablet, Take 1 tablet (4 mg total) by mouth every 8 (eight) hours as needed for nausea or vomiting., Disp: 20 tablet, Rfl: 0   ONE TOUCH LANCETS MISC, Use as directed, Disp: 200 each, Rfl: 11   OZEMPIC, 0.25 OR 0.5 MG/DOSE, 2 MG/1.5ML SOPN, INJECT 0.5 MG INTO THE SKIN ONCE A WEEK. OVER DUE FOR DIABETES CHECK. PLEASE SCHEDULE APPOINTMENT FOR MORE REFILLS, Disp: 1.5 mL, Rfl: 3   PREVIDENT 5000 SENSITIVE 1.1-5 % PSTE, , Disp: , Rfl:    rosuvastatin (CRESTOR) 20 MG tablet, TAKE 1 TABLET BY MOUTH ONCE DAILY, Disp: 30 tablet, Rfl: 3   sertraline (ZOLOFT) 100 MG tablet, Take 1 tablet (100 mg total) by mouth 2 (two) times daily. Need a diabetic follow up, Disp: 60 tablet, Rfl: 0   sildenafil (VIAGRA) 100 MG tablet, Take 0.5-1 tablets (50-100 mg total) by mouth daily as needed for erectile dysfunction., Disp: 30 tablet, Rfl: 5   SYNJARDY XR 06-998 MG TB24, TAKE 2 TABLETS BY MOUTH DAILY. OVER DUE FOR DIABETES CHECK. PLEASE SCHEDULE APPOINTMENT FOR MORE REFILLS, Disp: 60 tablet, Rfl: 3   testosterone cypionate (DEPOTESTOSTERONE CYPIONATE) 200 MG/ML injection,  INJECT 1 ML (200 MG TOTAL) INTO THE MUSCLE EVERY 14 (FOURTEEN) DAYS. INJECT 1 ML WEEKLY FOR 3 WEEKS. THE RESUME INJECTING 1 ML EVERY 14 DAYS, Disp: 2 mL, Rfl: 0  No Known Allergies  Objective:   Ht 6' (1.829 m)    Wt 211 lb (95.7 kg)    BMI 28.62 kg/m   Patient is well-developed, well-nourished in no acute distress.  Resting comfortably at home.  Head is normocephalic, atraumatic.  No labored breathing.  Speech is clear and coherent with logical content.  Patient is alert and oriented at baseline.   Assessment and Plan:   1. Type 2 diabetes mellitus without complication, with long-term current use of insulin ([place]) 2. 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  Is not taking insulin regularly and is not checking his glucose levels.  Discussed with patient that were not known to be able to get this under control without him partaking in his own health.  Reviewed cardiovascular risk factors of diabetes with patient.  Is going to try to work harder on this.  He endorses he will start using his freestyle today.  Patient wants to have his labs drawn at [facility] office.  Orders placed in system --CMP, lipid panel, A1c.  3. Essential hypertension Patient taking medications as directed.  Asymptomatic.  We will plan on continuing current regimen.  4. Hyperlipidemia, unspecified hyperlipidemia type Taking medications as directed.  Order placed for repeat fasting lipid panel and LFTs.  5. Hypogonadism in male 6. Erectile dysfunction, unspecified erectile dysfunction type Taking testosterone therapy as directed.  Is due for repeat testosterone level and PSA.  Orders placed.  Sildenafil refilled.  7. Anxiety and depression Stable.  Continue current medication regimen.    Leeanne Rio, PA-C 12/06/2019

## 2019-12-06 NOTE — Telephone Encounter (Signed)
Patient has appointment today

## 2019-12-06 NOTE — Telephone Encounter (Signed)
Testosterone last rx 10/27/19 LOV: 12/06/19 Last PSA: 06/09/19

## 2019-12-06 NOTE — Progress Notes (Signed)
I have discussed the procedure for the virtual visit with the patient who has given consent to proceed with assessment and treatment.   William [name] L Maxximus Gotay, CMA     

## 2019-12-07 NOTE — Telephone Encounter (Signed)
Xanax 10/27/19 #90 LOV: 12/06/19 DM CSC: 06/23/17

## 2019-12-08 ENCOUNTER — Other Ambulatory Visit: Payer: Self-pay | Admitting: Physician Assistant

## 2019-12-08 ENCOUNTER — Telehealth: Payer: Federal, State, Local not specified - PPO | Admitting: Physician Assistant

## 2019-12-08 MED ORDER — ALPRAZOLAM 1 MG PO TABS: 1.0000 mg | ORAL_TABLET | Freq: Three times a day (TID) | ORAL | 0 refills | Status: DC | PRN

## 2019-12-08 MED FILL — ALPRAZolam 1 MG TABS: 1 | 30 days supply | Qty: 90 | Fill #0

## 2019-12-14 ENCOUNTER — Other Ambulatory Visit (INDEPENDENT_AMBULATORY_CARE_PROVIDER_SITE_OTHER): Payer: Federal, State, Local not specified - PPO

## 2019-12-14 ENCOUNTER — Other Ambulatory Visit: Payer: Self-pay

## 2019-12-14 DIAGNOSIS — F32A Depression, unspecified: Secondary | ICD-10-CM

## 2019-12-14 DIAGNOSIS — I10 Essential (primary) hypertension: Secondary | ICD-10-CM | POA: Diagnosis not present

## 2019-12-14 DIAGNOSIS — F419 Anxiety disorder, unspecified: Secondary | ICD-10-CM | POA: Diagnosis not present

## 2019-12-14 DIAGNOSIS — E291 Testicular hypofunction: Secondary | ICD-10-CM

## 2019-12-14 DIAGNOSIS — N529 Male erectile dysfunction, unspecified: Secondary | ICD-10-CM

## 2019-12-14 DIAGNOSIS — Z794 Long term (current) use of insulin: Secondary | ICD-10-CM

## 2019-12-14 DIAGNOSIS — E119 Type 2 diabetes mellitus without complications: Secondary | ICD-10-CM | POA: Diagnosis not present

## 2019-12-14 DIAGNOSIS — E785 Hyperlipidemia, unspecified: Secondary | ICD-10-CM

## 2019-12-15 LAB — COMPREHENSIVE METABOLIC PANEL
ALT: 38 U/L (ref 0–53)
AST: 27 U/L (ref 0–37)
Albumin: 4.6 g/dL (ref 3.5–5.2)
Alkaline Phosphatase: 65 U/L (ref 39–117)
BUN: 20 mg/dL (ref 6–23)
CO2: 29 mEq/L (ref 19–32)
Calcium: 9.3 mg/dL (ref 8.4–10.5)
Chloride: 102 mEq/L (ref 96–112)
Creatinine, Ser: 0.95 mg/dL (ref 0.40–1.50)
GFR: 86.38 mL/min (ref >60.00)
Glucose, Bld: 165 mg/dL — ABNORMAL HIGH (ref 70–99)
Potassium: 4.2 mEq/L (ref 3.5–5.1)
Sodium: 139 mEq/L (ref 135–145)
Total Bilirubin: 0.3 mg/dL (ref 0.2–1.2)
Total Protein: 7 g/dL (ref 6.0–8.3)

## 2019-12-15 LAB — CBC WITH DIFFERENTIAL/PLATELET
Basophils Absolute: 0.1 10*3/uL (ref 0.0–0.1)
Basophils Relative: 1.3 % (ref 0.0–3.0)
Eosinophils Absolute: 0.2 10*3/uL (ref 0.0–0.7)
Eosinophils Relative: 3.4 % (ref 0.0–5.0)
HCT: 41.3 % (ref 39.0–52.0)
Hemoglobin: 14 g/dL (ref 13.0–17.0)
Lymphocytes Relative: 28.9 % (ref 12.0–46.0)
Lymphs Abs: 1.9 10*3/uL (ref 0.7–4.0)
MCHC: 34 g/dL (ref 30.0–36.0)
MCV: 88.4 fl (ref 78.0–100.0)
Monocytes Absolute: 0.5 10*3/uL (ref 0.1–1.0)
Monocytes Relative: 7 % (ref 3.0–12.0)
Neutro Abs: 4 10*3/uL (ref 1.4–7.7)
Neutrophils Relative %: 59.4 % (ref 43.0–77.0)
Platelets: 191 10*3/uL (ref 150.0–400.0)
RBC: 4.67 Mil/uL (ref 4.22–5.81)
RDW: 15 % (ref 11.5–15.5)
WBC: 6.7 10*3/uL (ref 4.0–10.5)

## 2019-12-15 LAB — URINALYSIS, ROUTINE W REFLEX MICROSCOPIC
Bilirubin Urine: NEGATIVE
Hgb urine dipstick: NEGATIVE
Ketones, ur: NEGATIVE
Leukocytes,Ua: NEGATIVE
Nitrite: NEGATIVE
Specific Gravity, Urine: 1.02 (ref 1.000–1.030)
Total Protein, Urine: NEGATIVE
Urine Glucose: >=1000 — AB
Urobilinogen, UA: 0.2 (ref 0.0–1.0)
pH: 7.5 (ref 5.0–8.0)

## 2019-12-15 LAB — LIPID PANEL
Cholesterol: 154 mg/dL (ref 0–200)
HDL: 33.9 mg/dL — ABNORMAL LOW (ref >39.00)
NonHDL: 119.63
Total CHOL/HDL Ratio: 5
Triglycerides: 246 mg/dL — ABNORMAL HIGH (ref 0.0–149.0)
VLDL: 49.2 mg/dL — ABNORMAL HIGH (ref 0.0–40.0)

## 2019-12-15 LAB — TESTOSTERONE: Testosterone: 144.2 ng/dL — ABNORMAL LOW (ref 300.00–890.00)

## 2019-12-15 LAB — HEMOGLOBIN A1C: Hgb A1c MFr Bld: 9.5 % — ABNORMAL HIGH (ref 4.6–6.5)

## 2019-12-15 LAB — PSA: PSA: 0.67 ng/mL (ref 0.10–4.00)

## 2019-12-15 LAB — TSH: TSH: 2.89 u[IU]/mL (ref 0.35–4.50)

## 2019-12-15 LAB — LDL CHOLESTEROL, DIRECT: Direct LDL: 95 mg/dL

## 2019-12-24 ENCOUNTER — Other Ambulatory Visit: Payer: Self-pay | Admitting: Emergency Medicine

## 2019-12-24 DIAGNOSIS — E119 Type 2 diabetes mellitus without complications: Secondary | ICD-10-CM

## 2019-12-24 DIAGNOSIS — E291 Testicular hypofunction: Secondary | ICD-10-CM

## 2019-12-24 DIAGNOSIS — E785 Hyperlipidemia, unspecified: Secondary | ICD-10-CM

## 2019-12-24 DIAGNOSIS — R7989 Other specified abnormal findings of blood chemistry: Secondary | ICD-10-CM

## 2019-12-24 MED ORDER — ROSUVASTATIN CALCIUM 40 MG PO TABS: 40.0000 mg | ORAL_TABLET | Freq: Every day | ORAL | 1 refills | Status: DC

## 2019-12-24 MED FILL — ROSUVASTATIN CALCIUM 40 MG: 40 | 90 days supply | Qty: 90 | Fill #0

## 2019-12-27 MED FILL — PREVIDENT 5000 SENSITIVE PA: 1.1-5 | 30 days supply | Qty: 100 | Fill #1

## 2019-12-29 MED FILL — SYNJARDY XR 5-1,000 MG TAB: 5-1000 | 90 days supply | Qty: 180 | Fill #0

## 2020-01-03 ENCOUNTER — Other Ambulatory Visit: Payer: Self-pay | Admitting: Physician Assistant

## 2020-01-03 DIAGNOSIS — E291 Testicular hypofunction: Secondary | ICD-10-CM

## 2020-01-04 ENCOUNTER — Other Ambulatory Visit: Payer: Self-pay | Admitting: Physician Assistant

## 2020-01-04 MED FILL — SILDENAFIL CITRATE 100 MG T: 100 | 30 days supply | Qty: 30 | Fill #1

## 2020-01-04 NOTE — Telephone Encounter (Signed)
LFD for testosterone 12/06/19 2 mL with no refills LFD for alprazolam 12/08/19 #90 with no refills LOV 12/16/19 NOV none

## 2020-01-05 MED FILL — ALPRAZolam 1 MG TABS: 1 | 30 days supply | Qty: 90 | Fill #0

## 2020-01-06 ENCOUNTER — Other Ambulatory Visit: Payer: Self-pay | Admitting: Physician Assistant

## 2020-01-06 DIAGNOSIS — E291 Testicular hypofunction: Secondary | ICD-10-CM

## 2020-01-06 NOTE — Telephone Encounter (Signed)
Last refill: 12/06/19 50mL, 0 Last OV: 12/06/19 dx. DM

## 2020-01-10 ENCOUNTER — Other Ambulatory Visit: Payer: Self-pay | Admitting: Physician Assistant

## 2020-01-10 DIAGNOSIS — E291 Testicular hypofunction: Secondary | ICD-10-CM

## 2020-01-11 NOTE — Telephone Encounter (Signed)
LFD 12/06/19 50mL with no refills LOV 12/06/19 NOV none

## 2020-01-19 MED FILL — PREVIDENT 5000 SENSITIVE PA: 1.1-5 | 30 days supply | Qty: 100 | Fill #2

## 2020-02-03 ENCOUNTER — Other Ambulatory Visit (HOSPITAL_BASED_OUTPATIENT_CLINIC_OR_DEPARTMENT_OTHER): Payer: Self-pay | Admitting: Nurse Practitioner

## 2020-02-03 DIAGNOSIS — U071 COVID-19: Secondary | ICD-10-CM | POA: Diagnosis not present

## 2020-02-03 DIAGNOSIS — R059 Cough, unspecified: Secondary | ICD-10-CM | POA: Diagnosis not present

## 2020-02-03 MED FILL — AZITHROMYCIN 250 MG TABLET: 250 | 5 days supply | Qty: 6 | Fill #0

## 2020-02-03 MED FILL — predniSONE 5 MG TABS: 5 | 6 days supply | Qty: 21 | Fill #0

## 2020-02-03 MED FILL — VITAMIN D3 25 MCG (1000 UT): 25 MCG | 50 days supply | Qty: 100 | Fill #0

## 2020-02-03 MED FILL — VITAMIN C 500 MG TABLET: 500 | 25 days supply | Qty: 100 | Fill #0

## 2020-02-07 ENCOUNTER — Other Ambulatory Visit: Payer: Self-pay | Admitting: Physician Assistant

## 2020-02-07 DIAGNOSIS — E119 Type 2 diabetes mellitus without complications: Secondary | ICD-10-CM

## 2020-02-07 MED FILL — ALPRAZolam 1 MG TABS: 1 | 30 days supply | Qty: 90 | Fill #0

## 2020-02-07 MED FILL — SILDENAFIL CITRATE 100 MG T: 100 | 30 days supply | Qty: 30 | Fill #2

## 2020-02-07 NOTE — Telephone Encounter (Signed)
LFD 01/04/20 #90 with no refills LOV 12/06/19 NOV none

## 2020-02-08 DIAGNOSIS — U071 COVID-19: Secondary | ICD-10-CM | POA: Diagnosis not present

## 2020-02-14 ENCOUNTER — Encounter: Payer: Self-pay | Admitting: Physician Assistant

## 2020-02-14 ENCOUNTER — Other Ambulatory Visit: Payer: Self-pay | Admitting: Physician Assistant

## 2020-02-14 ENCOUNTER — Other Ambulatory Visit: Payer: Self-pay | Admitting: Family Medicine

## 2020-02-14 DIAGNOSIS — F32A Depression, unspecified: Secondary | ICD-10-CM

## 2020-02-14 DIAGNOSIS — I10 Essential (primary) hypertension: Secondary | ICD-10-CM

## 2020-02-14 DIAGNOSIS — E291 Testicular hypofunction: Secondary | ICD-10-CM

## 2020-02-14 NOTE — Telephone Encounter (Signed)
Ok to give 18-month refill of diabetic medications until he can get in with specialist.

## 2020-02-15 ENCOUNTER — Other Ambulatory Visit: Payer: Self-pay | Admitting: Emergency Medicine

## 2020-02-15 DIAGNOSIS — E119 Type 2 diabetes mellitus without complications: Secondary | ICD-10-CM

## 2020-02-15 MED ORDER — SYNJARDY XR 5-1000 MG PO TB24: ORAL_TABLET | ORAL | 1 refills | Status: DC

## 2020-02-15 MED ORDER — LISINOPRIL-HYDROCHLOROTHIAZIDE 10-12.5 MG PO TABS: 1.0000 | ORAL_TABLET | Freq: Every day | ORAL | 0 refills | Status: DC

## 2020-02-15 MED ORDER — BUPROPION HCL ER (XL) 300 MG PO TB24: 300.0000 mg | ORAL_TABLET | Freq: Every day | ORAL | 0 refills | Status: DC

## 2020-02-15 MED ORDER — BASAGLAR KWIKPEN 100 UNIT/ML ~~LOC~~ SOPN: 100.0000 [IU] | PEN_INJECTOR | Freq: Two times a day (BID) | SUBCUTANEOUS | 3 refills | Status: DC

## 2020-02-15 MED FILL — LISINOPRIL-HCTZ 10-12.5 MG: 10-12.5 | 90 days supply | Qty: 90 | Fill #0

## 2020-02-15 MED FILL — buPROPion HCL ER (XL) 300 M: 300 | 90 days supply | Qty: 90 | Fill #0

## 2020-02-15 NOTE — Telephone Encounter (Signed)
Testosterone last rx 12/06/19 #2 LOV: 12/06/19  Pending appointment with Endo on 03/14/20

## 2020-02-16 ENCOUNTER — Other Ambulatory Visit: Payer: Self-pay | Admitting: Physician Assistant

## 2020-02-16 MED ORDER — TESTOSTERONE CYPIONATE 200 MG/ML IM SOLN
200.0000 mg | INTRAMUSCULAR | 0 refills | Status: DC
Start: 2020-02-16 — End: 2020-04-04

## 2020-02-18 ENCOUNTER — Telehealth: Payer: Self-pay | Admitting: Emergency Medicine

## 2020-02-18 NOTE — Telephone Encounter (Signed)
PA started thru Cover My Meds for patient Testosterone Cypionate 200mg /ml solution PA approved from 01/19/2020-02/17/2021

## 2020-02-19 DIAGNOSIS — R9431 Abnormal electrocardiogram [ECG] [EKG]: Secondary | ICD-10-CM | POA: Diagnosis not present

## 2020-02-19 DIAGNOSIS — R0602 Shortness of breath: Secondary | ICD-10-CM | POA: Diagnosis not present

## 2020-02-19 DIAGNOSIS — U071 COVID-19: Secondary | ICD-10-CM | POA: Diagnosis not present

## 2020-02-21 MED FILL — TESTOSTERONE CYP 200 MG/ML: 200 | 30 days supply | Qty: 2 | Fill #0

## 2020-02-22 ENCOUNTER — Ambulatory Visit: Payer: Federal, State, Local not specified - PPO | Admitting: Internal Medicine

## 2020-02-23 ENCOUNTER — Encounter: Payer: Self-pay | Admitting: Emergency Medicine

## 2020-02-23 DIAGNOSIS — E113292 Type 2 diabetes mellitus with mild nonproliferative diabetic retinopathy without macular edema, left eye: Secondary | ICD-10-CM | POA: Diagnosis not present

## 2020-02-23 DIAGNOSIS — E119 Type 2 diabetes mellitus without complications: Secondary | ICD-10-CM | POA: Diagnosis not present

## 2020-02-23 LAB — HM DIABETES EYE EXAM

## 2020-02-28 ENCOUNTER — Encounter: Payer: Self-pay | Admitting: Physician Assistant

## 2020-02-28 NOTE — Telephone Encounter (Signed)
Pt called in asking if William [name] could call him, he states that he wanted to thank him for his kindness and to get a recommendation for a provider at the [facility].   Please advise

## 2020-03-06 ENCOUNTER — Other Ambulatory Visit: Payer: Self-pay | Admitting: Physician Assistant

## 2020-03-06 MED FILL — SILDENAFIL CITRATE 100 MG T: 100 | 30 days supply | Qty: 30 | Fill #3

## 2020-03-06 MED FILL — SERTRALINE HCL 100 MG TABS: 100 | 90 days supply | Qty: 180 | Fill #1

## 2020-03-06 MED FILL — PREVIDENT 5000 SENSITIVE PA: 1.1-5 | 30 days supply | Qty: 100 | Fill #3

## 2020-03-06 MED FILL — TESTOSTERONE CYP 200 MG/ML: 200 | 30 days supply | Qty: 2 | Fill #0

## 2020-03-07 ENCOUNTER — Other Ambulatory Visit: Payer: Self-pay | Admitting: Family

## 2020-03-07 MED FILL — ALPRAZolam 1 MG TABS: 1 | 30 days supply | Qty: 90 | Fill #0

## 2020-03-07 NOTE — Telephone Encounter (Signed)
Xanax last rx 02/07/20 #90 LOV: 12/06/19 DM CSC: 06/23/17

## 2020-03-14 ENCOUNTER — Ambulatory Visit: Payer: Federal, State, Local not specified - PPO | Admitting: Internal Medicine

## 2020-03-23 DIAGNOSIS — H6123 Impacted cerumen, bilateral: Secondary | ICD-10-CM | POA: Diagnosis not present

## 2020-04-03 ENCOUNTER — Telehealth: Payer: Self-pay | Admitting: Family Medicine

## 2020-04-03 ENCOUNTER — Other Ambulatory Visit: Payer: Self-pay | Admitting: Physician Assistant

## 2020-04-03 DIAGNOSIS — E119 Type 2 diabetes mellitus without complications: Secondary | ICD-10-CM

## 2020-04-03 DIAGNOSIS — E291 Testicular hypofunction: Secondary | ICD-10-CM

## 2020-04-03 MED FILL — ALPRAZolam 1 MG TABS: 1 | 30 days supply | Qty: 90 | Fill #0

## 2020-04-03 MED FILL — SILDENAFIL CITRATE 100 MG T: 100 | 30 days supply | Qty: 30 | Fill #4

## 2020-04-03 MED FILL — SYNJARDY XR 5-1,000 MG TAB: 5-1000 | 90 days supply | Qty: 180 | Fill #0

## 2020-04-03 NOTE — Telephone Encounter (Signed)
He's welcome to schedule a visit with me to see if we would be a good fit for one another. Ty.

## 2020-04-03 NOTE — Telephone Encounter (Signed)
Cell phone for [phone]    Patient is wanting to [facility] since [facility] is no longer with [place]. States his son will be seeing Dr Nani Ravens and would like to see him too!  If so I can call and schedule an appt with him!!  Would prefer afternoon appt at 3pm

## 2020-04-04 ENCOUNTER — Other Ambulatory Visit: Payer: Self-pay | Admitting: Family

## 2020-04-04 MED FILL — OZEMPIC 0.25 OR 0.5 MG/DOSE: 2 | 28 days supply | Qty: 2 | Fill #0

## 2020-04-04 MED FILL — TESTOSTERONE CYP 200 MG/ML: 200 | 28 days supply | Qty: 2 | Fill #0

## 2020-04-04 NOTE — Telephone Encounter (Signed)
Testosterone last rx 02/16/20 2ML  Patient was referred to Endocrinology for hypogonadism and DM but has cancelled appointments

## 2020-04-07 DIAGNOSIS — H903 Sensorineural hearing loss, bilateral: Secondary | ICD-10-CM | POA: Diagnosis not present

## 2020-04-19 ENCOUNTER — Other Ambulatory Visit: Payer: Self-pay | Admitting: Family Medicine

## 2020-04-19 ENCOUNTER — Ambulatory Visit (INDEPENDENT_AMBULATORY_CARE_PROVIDER_SITE_OTHER): Payer: Federal, State, Local not specified - PPO | Admitting: Family Medicine

## 2020-04-19 ENCOUNTER — Encounter: Payer: Self-pay | Admitting: Family Medicine

## 2020-04-19 ENCOUNTER — Other Ambulatory Visit: Payer: Self-pay

## 2020-04-19 VITALS — BP 122/70 | HR 92 | Temp 98.3°F | Resp 17 | Ht 72.0 in | Wt 216.0 lb

## 2020-04-19 DIAGNOSIS — Z0001 Encounter for general adult medical examination with abnormal findings: Secondary | ICD-10-CM

## 2020-04-19 DIAGNOSIS — E291 Testicular hypofunction: Secondary | ICD-10-CM | POA: Diagnosis not present

## 2020-04-19 DIAGNOSIS — E113211 Type 2 diabetes mellitus with mild nonproliferative diabetic retinopathy with macular edema, right eye: Secondary | ICD-10-CM | POA: Diagnosis not present

## 2020-04-19 DIAGNOSIS — Z0000 Encounter for general adult medical examination without abnormal findings: Secondary | ICD-10-CM | POA: Diagnosis not present

## 2020-04-19 DIAGNOSIS — F419 Anxiety disorder, unspecified: Secondary | ICD-10-CM

## 2020-04-19 DIAGNOSIS — R350 Frequency of micturition: Secondary | ICD-10-CM | POA: Diagnosis not present

## 2020-04-19 DIAGNOSIS — F32A Depression, unspecified: Secondary | ICD-10-CM

## 2020-04-19 LAB — CBC
HCT: 41.9 % (ref 39.0–52.0)
Hemoglobin: 14.3 g/dL (ref 13.0–17.0)
MCHC: 34.2 g/dL (ref 30.0–36.0)
MCV: 87 fl (ref 78.0–100.0)
Platelets: 174 10*3/uL (ref 150.0–400.0)
RBC: 4.82 Mil/uL (ref 4.22–5.81)
RDW: 14.3 % (ref 11.5–15.5)
WBC: 4.6 10*3/uL (ref 4.0–10.5)

## 2020-04-19 LAB — LIPID PANEL
Cholesterol: 174 mg/dL (ref 0–200)
HDL: 27.1 mg/dL — ABNORMAL LOW (ref >39.00)
Total CHOL/HDL Ratio: 6
Triglycerides: 526 mg/dL — ABNORMAL HIGH (ref 0.0–149.0)

## 2020-04-19 LAB — COMPREHENSIVE METABOLIC PANEL
ALT: 37 U/L (ref 0–53)
AST: 19 U/L (ref 0–37)
Albumin: 4.3 g/dL (ref 3.5–5.2)
Alkaline Phosphatase: 70 U/L (ref 39–117)
BUN: 22 mg/dL (ref 6–23)
CO2: 25 mEq/L (ref 19–32)
Calcium: 9.3 mg/dL (ref 8.4–10.5)
Chloride: 101 mEq/L (ref 96–112)
Creatinine, Ser: 0.88 mg/dL (ref 0.40–1.50)
GFR: 92.58 mL/min (ref >60.00)
Glucose, Bld: 341 mg/dL — ABNORMAL HIGH (ref 70–99)
Potassium: 4.2 mEq/L (ref 3.5–5.1)
Sodium: 135 mEq/L (ref 135–145)
Total Bilirubin: 0.4 mg/dL (ref 0.2–1.2)
Total Protein: 6.8 g/dL (ref 6.0–8.3)

## 2020-04-19 LAB — LDL CHOLESTEROL, DIRECT: Direct LDL: 91 mg/dL

## 2020-04-19 LAB — URINALYSIS
Bilirubin Urine: NEGATIVE
Hgb urine dipstick: NEGATIVE
Ketones, ur: NEGATIVE
Leukocytes,Ua: NEGATIVE
Nitrite: NEGATIVE
Specific Gravity, Urine: 1.015 (ref 1.000–1.030)
Total Protein, Urine: NEGATIVE
Urine Glucose: >=1000 — AB
Urobilinogen, UA: 0.2 (ref 0.0–1.0)
pH: 5.5 (ref 5.0–8.0)

## 2020-04-19 LAB — TESTOSTERONE: Testosterone: 190.07 ng/dL — ABNORMAL LOW (ref 300.00–890.00)

## 2020-04-19 LAB — HEMOGLOBIN A1C: Hgb A1c MFr Bld: 9.9 % — ABNORMAL HIGH (ref 4.6–6.5)

## 2020-04-19 LAB — PSA: PSA: 0.28 ng/mL (ref 0.10–4.00)

## 2020-04-19 MED ORDER — QUETIAPINE FUMARATE 25 MG PO TABS: 25.0000 mg | ORAL_TABLET | Freq: Every day | ORAL | 2 refills | Status: DC

## 2020-04-19 MED ORDER — FREESTYLE LIBRE 2 SENSOR MISC: 0 refills | Status: DC

## 2020-04-19 MED ORDER — FREESTYLE LIBRE 2 READER DEVI: 0 refills | Status: DC
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
## 2020-04-19 NOTE — Patient Instructions (Addendum)
Give [place] 2-3 business days to get the results of your labs back.   Keep the diet clean and stay active.  If you do not hear anything about your referral in the next 1-2 weeks, call our office and ask for an update.  Coping skills Choose 5 that work for you:  Take a deep breath  Count to 20  Read a book  Do a puzzle  Meditate  Bake  Sing  Knit  Garden  Pray  Go outside  Call a friend  Listen to music  Take a walk  Color  Send a note  Take a bath  Watch a movie  Be alone in a quiet place  Pet an animal  Visit a friend  Journal  Exercise  Stretch   Let us know if you need anything.

## 2020-04-25 ENCOUNTER — Telehealth: Payer: Self-pay

## 2020-04-25 NOTE — Telephone Encounter (Signed)
Initiated PA via cover my meds.   KEY: [id number]  Waiting on determination.

## 2020-04-27 NOTE — Telephone Encounter (Signed)
Patients freestyle William [name] was denied by insurance. It did not give me a list of alternatives. William [name] could you have the patient contact his insurance to find an alternative monitor that is covered unless he has paid out of pocket.

## 2020-04-27 NOTE — Telephone Encounter (Signed)
Patient notified and advised that we did not get a reason for the denial.  He will just check with ins.  He thinks it may be to early because he has the first freestyle libre.

## 2020-05-10 ENCOUNTER — Other Ambulatory Visit: Payer: Self-pay | Admitting: Family

## 2020-05-10 ENCOUNTER — Other Ambulatory Visit (HOSPITAL_BASED_OUTPATIENT_CLINIC_OR_DEPARTMENT_OTHER): Payer: Self-pay

## 2020-05-10 DIAGNOSIS — E291 Testicular hypofunction: Secondary | ICD-10-CM

## 2020-05-10 MED FILL — Semaglutide Soln Pen-inj 0.25 or 0.5 MG/DOSE (2 MG/1.5ML): SUBCUTANEOUS | 28 days supply | Qty: 1.5 | Fill #0 | Status: AC

## 2020-05-16 ENCOUNTER — Other Ambulatory Visit (HOSPITAL_BASED_OUTPATIENT_CLINIC_OR_DEPARTMENT_OTHER): Payer: Self-pay

## 2020-05-16 ENCOUNTER — Other Ambulatory Visit: Payer: Self-pay | Admitting: Family Medicine

## 2020-05-16 DIAGNOSIS — F419 Anxiety disorder, unspecified: Secondary | ICD-10-CM

## 2020-05-16 DIAGNOSIS — F32A Depression, unspecified: Secondary | ICD-10-CM

## 2020-05-16 DIAGNOSIS — E291 Testicular hypofunction: Secondary | ICD-10-CM

## 2020-05-16 MED ORDER — BUPROPION HCL ER (XL) 300 MG PO TB24
ORAL_TABLET | ORAL | 0 refills | Status: DC
  Filled 2020-05-16: qty 90, 90d supply, fill #0

## 2020-05-16 MED ORDER — ALPRAZOLAM 1 MG PO TABS
ORAL_TABLET | Freq: Three times a day (TID) | ORAL | 0 refills | Status: DC | PRN
  Filled 2020-05-16: qty 90, 30d supply, fill #0

## 2020-05-16 MED ORDER — SERTRALINE HCL 100 MG PO TABS
ORAL_TABLET | Freq: Two times a day (BID) | ORAL | 1 refills | Status: DC
  Filled 2020-05-16: qty 180, 90d supply, fill #0
  Filled 2020-08-28: qty 180, 90d supply, fill #1

## 2020-05-16 MED ORDER — TESTOSTERONE CYPIONATE 200 MG/ML IM SOLN
INTRAMUSCULAR | 0 refills | Status: DC
  Filled 2020-05-16: qty 2, 28d supply, fill #0

## 2020-05-16 MED FILL — Sildenafil Citrate Tab 100 MG: ORAL | 30 days supply | Qty: 30 | Fill #0 | Status: AC

## 2020-05-16 NOTE — Telephone Encounter (Signed)
Last Office visit on 04/19/2020 Last RF for Alprazolam  #90 on 03/07/2020 Need updated CSC/UDS

## 2020-05-17 ENCOUNTER — Ambulatory Visit: Payer: Federal, State, Local not specified - PPO | Attending: Internal Medicine

## 2020-05-17 ENCOUNTER — Other Ambulatory Visit (HOSPITAL_BASED_OUTPATIENT_CLINIC_OR_DEPARTMENT_OTHER): Payer: Self-pay

## 2020-05-17 DIAGNOSIS — Z23 Encounter for immunization: Secondary | ICD-10-CM

## 2020-05-17 NOTE — Progress Notes (Signed)
   Covid-19 Vaccination Clinic  Name:  William [name]    MRN: [id number] DOB: 1960-11-29  05/17/2020  [name] was observed post Covid-19 immunization for 15 minutes without incident. He was provided with Vaccine Information Sheet and instruction to access the V-Safe system.   [name] was instructed to call 911 with any severe reactions post vaccine: [name] Difficulty breathing  . Swelling of face and throat  . A fast heartbeat  . A bad rash all over body  . Dizziness and weakness   Immunizations Administered    Name Date Dose VIS Date Route   Moderna Covid-19 Booster Vaccine 05/17/2020 12:50 PM 0.25 mL 11/24/2019 Intramuscular   Manufacturer: Moderna   Lot: [id number]   [place]: 24114-643-14

## 2020-05-22 ENCOUNTER — Other Ambulatory Visit (HOSPITAL_BASED_OUTPATIENT_CLINIC_OR_DEPARTMENT_OTHER): Payer: Self-pay

## 2020-05-22 MED ORDER — MODERNA COVID-19 VACCINE 100 MCG/0.5ML IM SUSP
INTRAMUSCULAR | 0 refills | Status: DC
  Filled 2020-05-22: qty 0.25, 1d supply, fill #0

## 2020-05-24 ENCOUNTER — Other Ambulatory Visit (HOSPITAL_BASED_OUTPATIENT_CLINIC_OR_DEPARTMENT_OTHER): Payer: Self-pay

## 2020-06-02 ENCOUNTER — Other Ambulatory Visit (HOSPITAL_BASED_OUTPATIENT_CLINIC_OR_DEPARTMENT_OTHER): Payer: Self-pay

## 2020-06-02 ENCOUNTER — Other Ambulatory Visit: Payer: Self-pay | Admitting: Family Medicine

## 2020-06-02 ENCOUNTER — Ambulatory Visit: Payer: Federal, State, Local not specified - PPO | Admitting: Family Medicine

## 2020-06-02 DIAGNOSIS — I10 Essential (primary) hypertension: Secondary | ICD-10-CM

## 2020-06-02 MED ORDER — LISINOPRIL-HYDROCHLOROTHIAZIDE 10-12.5 MG PO TABS
1.0000 | ORAL_TABLET | Freq: Every day | ORAL | 0 refills | Status: DC
  Filled 2020-06-02 – 2020-06-19 (×2): qty 90, 90d supply, fill #0

## 2020-06-02 MED FILL — Fenofibrate Tab 145 MG: ORAL | 90 days supply | Qty: 90 | Fill #0 | Status: AC

## 2020-06-02 MED FILL — Sodium Fluoride-Potassium Nitrate Gel 1.1-5%: DENTAL | 30 days supply | Qty: 100 | Fill #0 | Status: AC

## 2020-06-02 MED FILL — Quetiapine Fumarate Tab 25 MG: ORAL | 30 days supply | Qty: 30 | Fill #0 | Status: AC

## 2020-06-02 MED FILL — Rosuvastatin Calcium Tab 40 MG: ORAL | 90 days supply | Qty: 90 | Fill #0 | Status: AC

## 2020-06-09 ENCOUNTER — Other Ambulatory Visit (HOSPITAL_BASED_OUTPATIENT_CLINIC_OR_DEPARTMENT_OTHER): Payer: Self-pay

## 2020-06-13 DIAGNOSIS — M5451 Vertebrogenic low back pain: Secondary | ICD-10-CM | POA: Diagnosis not present

## 2020-06-13 DIAGNOSIS — M546 Pain in thoracic spine: Secondary | ICD-10-CM | POA: Diagnosis not present

## 2020-06-13 DIAGNOSIS — M9903 Segmental and somatic dysfunction of lumbar region: Secondary | ICD-10-CM | POA: Diagnosis not present

## 2020-06-13 DIAGNOSIS — M9902 Segmental and somatic dysfunction of thoracic region: Secondary | ICD-10-CM | POA: Diagnosis not present

## 2020-06-19 ENCOUNTER — Other Ambulatory Visit: Payer: Self-pay | Admitting: Family Medicine

## 2020-06-19 ENCOUNTER — Other Ambulatory Visit (HOSPITAL_BASED_OUTPATIENT_CLINIC_OR_DEPARTMENT_OTHER): Payer: Self-pay

## 2020-06-19 DIAGNOSIS — E113211 Type 2 diabetes mellitus with mild nonproliferative diabetic retinopathy with macular edema, right eye: Secondary | ICD-10-CM

## 2020-06-19 DIAGNOSIS — M9902 Segmental and somatic dysfunction of thoracic region: Secondary | ICD-10-CM | POA: Diagnosis not present

## 2020-06-19 DIAGNOSIS — M546 Pain in thoracic spine: Secondary | ICD-10-CM | POA: Diagnosis not present

## 2020-06-19 DIAGNOSIS — M5451 Vertebrogenic low back pain: Secondary | ICD-10-CM | POA: Diagnosis not present

## 2020-06-19 DIAGNOSIS — M9903 Segmental and somatic dysfunction of lumbar region: Secondary | ICD-10-CM | POA: Diagnosis not present

## 2020-06-19 DIAGNOSIS — E291 Testicular hypofunction: Secondary | ICD-10-CM

## 2020-06-19 MED ORDER — FREESTYLE LIBRE 14 DAY SENSOR MISC
0 refills | Status: DC
Start: 2020-06-19 — End: 2020-07-21
  Filled 2020-06-19: qty 2, 28d supply, fill #0

## 2020-06-19 MED ORDER — TESTOSTERONE CYPIONATE 200 MG/ML IM SOLN
INTRAMUSCULAR | 0 refills | Status: DC
  Filled 2020-06-19: qty 2, 28d supply, fill #0

## 2020-06-19 MED ORDER — ALPRAZOLAM 1 MG PO TABS
ORAL_TABLET | Freq: Three times a day (TID) | ORAL | 0 refills | Status: DC | PRN
  Filled 2020-06-19: qty 90, 30d supply, fill #0

## 2020-06-19 MED ORDER — SILDENAFIL CITRATE 100 MG PO TABS
ORAL_TABLET | ORAL | 5 refills | Status: DC
  Filled 2020-06-19: qty 30, 30d supply, fill #0
  Filled 2020-08-28: qty 30, 30d supply, fill #1
  Filled 2020-10-02: qty 30, 30d supply, fill #2

## 2020-06-19 MED FILL — Quetiapine Fumarate Tab 25 MG: ORAL | 30 days supply | Qty: 30 | Fill #1 | Status: CN

## 2020-06-19 MED FILL — Semaglutide Soln Pen-inj 0.25 or 0.5 MG/DOSE (2 MG/1.5ML): SUBCUTANEOUS | 28 days supply | Qty: 1.5 | Fill #1 | Status: AC

## 2020-06-19 MED FILL — Empagliflozin-Metformin HCl Tab ER 24HR 5-1000 MG: ORAL | 90 days supply | Qty: 180 | Fill #0 | Status: AC

## 2020-06-19 NOTE — Telephone Encounter (Signed)
Requesting: testosterone 200mg /mL injection and alprazolam 1mg  Contract: 06/20/2017 UDS: 06/23/2017 Last Visit: 04/19/20 Next Visit: None Last Refill on testosterone 05/16/2020 #12mL and 0RF Last Refill on alprazolam: 05/16/2020 #90 and 0RF (Pt sig: 1 tab tid prn)  Please Advise

## 2020-06-26 ENCOUNTER — Other Ambulatory Visit (HOSPITAL_BASED_OUTPATIENT_CLINIC_OR_DEPARTMENT_OTHER): Payer: Self-pay

## 2020-06-26 MED FILL — Quetiapine Fumarate Tab 25 MG: ORAL | 30 days supply | Qty: 30 | Fill #1 | Status: AC

## 2020-06-28 ENCOUNTER — Other Ambulatory Visit (HOSPITAL_BASED_OUTPATIENT_CLINIC_OR_DEPARTMENT_OTHER): Payer: Self-pay

## 2020-06-28 ENCOUNTER — Encounter: Payer: Self-pay | Admitting: Podiatry

## 2020-06-28 ENCOUNTER — Other Ambulatory Visit: Payer: Self-pay

## 2020-06-28 ENCOUNTER — Ambulatory Visit: Payer: Federal, State, Local not specified - PPO | Admitting: Podiatry

## 2020-06-28 DIAGNOSIS — M722 Plantar fascial fibromatosis: Secondary | ICD-10-CM | POA: Diagnosis not present

## 2020-06-28 MED ORDER — DICLOFENAC SODIUM 75 MG PO TBEC
75.0000 mg | DELAYED_RELEASE_TABLET | Freq: Two times a day (BID) | ORAL | 2 refills | Status: DC
  Filled 2020-06-28: qty 50, 25d supply, fill #0
  Filled 2020-12-04: qty 50, 25d supply, fill #1
  Filled 2021-03-07: qty 50, 25d supply, fill #2

## 2020-06-28 MED ORDER — TRIAMCINOLONE ACETONIDE 10 MG/ML IJ SUSP
20.0000 mg | Freq: Once | INTRAMUSCULAR | Status: AC
  Administered 2020-06-28: 20 mg

## 2020-06-29 NOTE — Progress Notes (Signed)
Subjective:   Patient ID: William [name], male   DOB: 60 y.o.   MRN: [id number]   HPI Patient presents with chronic heel pain bilateral that has been bothersome for him worse over the last few months and he has had history of this over the years with flatfoot deformity and knows he needs some type of insert control   ROS      Objective:  Physical Exam  Neurovascular status intact with inflammation pain around the plantar fascial bilateral at the insertion to the calcaneus with flatfoot deformity noted      Assessment:  Acute Planter fasciitis bilateral with flatfoot deformity     Plan:  H&P reviewed condition sterile prep injected the plantar fascial bilateral 3 mg Kenalog 5 mg Xylocaine discussed orthotics and patient is scanned for customized orthotic devices to lift up the arch with all instructions on the usage of custom devices.  Reappoint to recheck also placed on diclofenac 75 mg twice daily

## 2020-07-07 ENCOUNTER — Telehealth: Payer: Self-pay | Admitting: Podiatry

## 2020-07-07 ENCOUNTER — Other Ambulatory Visit (HOSPITAL_BASED_OUTPATIENT_CLINIC_OR_DEPARTMENT_OTHER): Payer: Self-pay

## 2020-07-07 MED ORDER — AMOXICILLIN 500 MG PO CAPS
ORAL_CAPSULE | ORAL | 0 refills | Status: DC
  Filled 2020-07-07: qty 21, 7d supply, fill #0

## 2020-07-07 MED ORDER — IBUPROFEN 800 MG PO TABS
ORAL_TABLET | ORAL | 0 refills | Status: DC
  Filled 2020-07-07: qty 20, 7d supply, fill #0

## 2020-07-07 MED ORDER — CHLORHEXIDINE GLUCONATE 0.12 % MT SOLN
OROMUCOSAL | 3 refills | Status: DC
  Filled 2020-07-07 – 2021-03-07 (×2): qty 473, 16d supply, fill #0
  Filled 2021-04-13: qty 473, 16d supply, fill #1
  Filled 2021-05-10: qty 473, 16d supply, fill #2
  Filled 2021-06-08: qty 473, 16d supply, fill #3

## 2020-07-07 NOTE — Telephone Encounter (Signed)
Per Dorthula Nettles  @ [employer] customer service orthotics([id number])  Valid and billable code, based on medical necessity.  covered @ 70% no deductible applies... out of pocket for individual is 6500.00(met 624.14) family 13,000. Met 1014.72... ref # [id number]...   I also called and left message for pt with this information and estimate would be about 132.00. Pt returned call and is aware of benefits and that it is not a quarantee but the code we use is valid and billable. Covered @ 70% of allowable. Pt wants to get back to me and I told him that was fine to let me know.

## 2020-07-11 ENCOUNTER — Other Ambulatory Visit (HOSPITAL_BASED_OUTPATIENT_CLINIC_OR_DEPARTMENT_OTHER): Payer: Self-pay

## 2020-07-11 ENCOUNTER — Other Ambulatory Visit: Payer: Self-pay | Admitting: Family Medicine

## 2020-07-11 DIAGNOSIS — E119 Type 2 diabetes mellitus without complications: Secondary | ICD-10-CM

## 2020-07-11 DIAGNOSIS — R7989 Other specified abnormal findings of blood chemistry: Secondary | ICD-10-CM

## 2020-07-11 DIAGNOSIS — E291 Testicular hypofunction: Secondary | ICD-10-CM

## 2020-07-11 DIAGNOSIS — E785 Hyperlipidemia, unspecified: Secondary | ICD-10-CM

## 2020-07-11 DIAGNOSIS — I10 Essential (primary) hypertension: Secondary | ICD-10-CM

## 2020-07-11 DIAGNOSIS — Z794 Long term (current) use of insulin: Secondary | ICD-10-CM

## 2020-07-11 MED ORDER — TESTOSTERONE CYPIONATE 200 MG/ML IM SOLN
INTRAMUSCULAR | 0 refills | Status: DC
  Filled 2020-07-11: qty 2, fill #0
  Filled 2020-08-28: qty 2, 28d supply, fill #0

## 2020-07-11 NOTE — Telephone Encounter (Signed)
Patient informed. Scheduled lab appt/and OV

## 2020-07-11 NOTE — Telephone Encounter (Signed)
Spoke to the patient and he would like to come in before the appt. To do labs first, then the appt. Ok?? And if ok what labs to order?

## 2020-07-11 NOTE — Telephone Encounter (Signed)
Please make sure he is scheduled for DM visit. At this point 6/16 or after is fine. Ty.

## 2020-07-12 ENCOUNTER — Other Ambulatory Visit (HOSPITAL_BASED_OUTPATIENT_CLINIC_OR_DEPARTMENT_OTHER): Payer: Self-pay

## 2020-07-12 ENCOUNTER — Encounter: Payer: Self-pay | Admitting: Podiatry

## 2020-07-14 ENCOUNTER — Other Ambulatory Visit (HOSPITAL_BASED_OUTPATIENT_CLINIC_OR_DEPARTMENT_OTHER): Payer: Self-pay

## 2020-07-17 ENCOUNTER — Other Ambulatory Visit (HOSPITAL_BASED_OUTPATIENT_CLINIC_OR_DEPARTMENT_OTHER): Payer: Self-pay

## 2020-07-17 ENCOUNTER — Other Ambulatory Visit: Payer: Self-pay | Admitting: Family Medicine

## 2020-07-17 MED ORDER — ALPRAZOLAM 1 MG PO TABS
ORAL_TABLET | Freq: Three times a day (TID) | ORAL | 0 refills | Status: DC | PRN
  Filled 2020-07-17: qty 90, 30d supply, fill #0

## 2020-07-17 NOTE — Telephone Encounter (Signed)
Last OV--04/19/2020 Last RF--#90-- 06/19/2020 UDS--06/23/2017

## 2020-07-18 ENCOUNTER — Other Ambulatory Visit (HOSPITAL_BASED_OUTPATIENT_CLINIC_OR_DEPARTMENT_OTHER): Payer: Self-pay

## 2020-07-18 ENCOUNTER — Other Ambulatory Visit (INDEPENDENT_AMBULATORY_CARE_PROVIDER_SITE_OTHER): Payer: Federal, State, Local not specified - PPO

## 2020-07-18 ENCOUNTER — Other Ambulatory Visit: Payer: Self-pay

## 2020-07-18 DIAGNOSIS — E119 Type 2 diabetes mellitus without complications: Secondary | ICD-10-CM | POA: Diagnosis not present

## 2020-07-18 DIAGNOSIS — Z794 Long term (current) use of insulin: Secondary | ICD-10-CM

## 2020-07-18 DIAGNOSIS — R7989 Other specified abnormal findings of blood chemistry: Secondary | ICD-10-CM | POA: Diagnosis not present

## 2020-07-18 DIAGNOSIS — E785 Hyperlipidemia, unspecified: Secondary | ICD-10-CM | POA: Diagnosis not present

## 2020-07-18 DIAGNOSIS — I10 Essential (primary) hypertension: Secondary | ICD-10-CM

## 2020-07-18 DIAGNOSIS — E291 Testicular hypofunction: Secondary | ICD-10-CM | POA: Diagnosis not present

## 2020-07-18 LAB — LIPID PANEL
Cholesterol: 91 mg/dL (ref 0–200)
HDL: 20.4 mg/dL — ABNORMAL LOW (ref >39.00)
NonHDL: 70.3
Total CHOL/HDL Ratio: 4
Triglycerides: 231 mg/dL — ABNORMAL HIGH (ref 0.0–149.0)
VLDL: 46.2 mg/dL — ABNORMAL HIGH (ref 0.0–40.0)

## 2020-07-18 LAB — LDL CHOLESTEROL, DIRECT: Direct LDL: 37 mg/dL

## 2020-07-18 LAB — HEMOGLOBIN A1C: Hgb A1c MFr Bld: 8.7 % — ABNORMAL HIGH (ref 4.6–6.5)

## 2020-07-18 LAB — TESTOSTERONE: Testosterone: 158.06 ng/dL — ABNORMAL LOW (ref 300.00–890.00)

## 2020-07-18 MED ORDER — PREVIDENT 5000 SENSITIVE 1.1-5 % DT GEL
DENTAL | 1 refills | Status: DC
  Filled 2020-07-18: qty 100, 30d supply, fill #0
  Filled 2020-08-28 – 2020-10-03 (×2): qty 100, 30d supply, fill #1

## 2020-07-19 ENCOUNTER — Other Ambulatory Visit (HOSPITAL_BASED_OUTPATIENT_CLINIC_OR_DEPARTMENT_OTHER): Payer: Self-pay

## 2020-07-21 ENCOUNTER — Other Ambulatory Visit (HOSPITAL_BASED_OUTPATIENT_CLINIC_OR_DEPARTMENT_OTHER): Payer: Self-pay

## 2020-07-21 ENCOUNTER — Ambulatory Visit: Payer: Federal, State, Local not specified - PPO | Admitting: Family Medicine

## 2020-07-21 ENCOUNTER — Other Ambulatory Visit: Payer: Self-pay | Admitting: Family Medicine

## 2020-07-21 ENCOUNTER — Encounter: Payer: Self-pay | Admitting: Family Medicine

## 2020-07-21 ENCOUNTER — Other Ambulatory Visit: Payer: Self-pay

## 2020-07-21 VITALS — BP 138/78 | HR 93 | Temp 97.8°F | Ht 72.0 in | Wt 225.0 lb

## 2020-07-21 DIAGNOSIS — E291 Testicular hypofunction: Secondary | ICD-10-CM | POA: Diagnosis not present

## 2020-07-21 DIAGNOSIS — E785 Hyperlipidemia, unspecified: Secondary | ICD-10-CM | POA: Diagnosis not present

## 2020-07-21 DIAGNOSIS — F4310 Post-traumatic stress disorder, unspecified: Secondary | ICD-10-CM

## 2020-07-21 DIAGNOSIS — E113211 Type 2 diabetes mellitus with mild nonproliferative diabetic retinopathy with macular edema, right eye: Secondary | ICD-10-CM

## 2020-07-21 DIAGNOSIS — M722 Plantar fascial fibromatosis: Secondary | ICD-10-CM | POA: Diagnosis not present

## 2020-07-21 DIAGNOSIS — Z794 Long term (current) use of insulin: Secondary | ICD-10-CM

## 2020-07-21 DIAGNOSIS — E1165 Type 2 diabetes mellitus with hyperglycemia: Secondary | ICD-10-CM

## 2020-07-21 DIAGNOSIS — I10 Essential (primary) hypertension: Secondary | ICD-10-CM

## 2020-07-21 MED ORDER — METHYLPREDNISOLONE ACETATE 40 MG/ML IJ SUSP
20.0000 mg | Freq: Once | INTRAMUSCULAR | Status: AC
  Administered 2020-07-21: 20 mg via INTRA_ARTICULAR

## 2020-07-21 MED ORDER — ROSUVASTATIN CALCIUM 40 MG PO TABS
ORAL_TABLET | Freq: Every day | ORAL | 1 refills | Status: DC
  Filled 2020-07-21 – 2020-08-28 (×2): qty 90, 90d supply, fill #0
  Filled 2020-12-04: qty 90, 90d supply, fill #1

## 2020-07-21 MED ORDER — METHYLPREDNISOLONE ACETATE 40 MG/ML IJ SUSP
20.0000 mg | Freq: Once | INTRAMUSCULAR | Status: AC
Start: 2020-07-21 — End: 2020-07-21
  Administered 2020-07-21: 20 mg via INTRA_ARTICULAR

## 2020-07-21 MED ORDER — OZEMPIC (1 MG/DOSE) 4 MG/3ML ~~LOC~~ SOPN
1.0000 mg | PEN_INJECTOR | SUBCUTANEOUS | 2 refills | Status: DC
Start: 2020-07-21 — End: 2020-10-31
  Filled 2020-07-21: qty 3, 28d supply, fill #0
  Filled 2020-08-28: qty 3, 28d supply, fill #1
  Filled 2020-10-02: qty 3, 28d supply, fill #2

## 2020-07-21 MED ORDER — FREESTYLE LIBRE 14 DAY SENSOR MISC
0 refills | Status: DC
  Filled 2020-07-21: qty 2, 28d supply, fill #0

## 2020-07-21 MED ORDER — AZITHROMYCIN 500 MG PO TABS
ORAL_TABLET | ORAL | 0 refills | Status: DC
  Filled 2020-07-21: qty 3, 3d supply, fill #0

## 2020-07-21 NOTE — Patient Instructions (Signed)
Try to time your testosterone injections with Ozempic shots.  Keep the diet clean and stay active.  Enjoy your time in [place].  Let us know if you need anything.

## 2020-07-21 NOTE — Progress Notes (Signed)
Subjective:   Chief Complaint  Patient presents with   Follow-up    William [name] is a 60 y.o. male here for follow-up of diabetes.   William [name]'s self monitored glucose range is mid 100's.  Patient confirms hypoglycemic reactions. He has CGM. Patient does require insulin. Glargine 80 u bid.  Medications include: Ozempic 0.5 mg weekly, empagliflozin-metformin ER 06-998 mg, 2 tabs daily Diet is fair.  Exercise: walking No Cp or SOB  PTSD Due to childhood events, the patient has a history of PTSD.  He takes 2 to 3 mg of Xanax nightly.  He also takes Wellbutrin XL 300 mg daily and Zoloft 100 mg twice daily.  He has been using Seroquel as needed.  It does help him sleep from time to time.  He is not following with a counselor or psychologist.  No homicidal or suicidal ideation.  No self-medication.  Hypergonadism Testosterone is even lower than before.  The patient admits he forgets to take his doses.  He feels fatigued.  Planter fasciitis The patient has a history of plantar fasciitis.  He has received injections in the past which did help.  He is requesting it today.  Past Medical History:  Diagnosis Date   Adjustment disorder with mixed anxiety and depressed mood    Arthritis    Asthma    history   Diabetes mellitus    type 2   Elevated blood pressure    history of high blood pressure readings   History of chicken pox    Hypercholesteremia    Hypertension    Internal hemorrhoids    Tubular adenoma of colon      Related testing: Retinal exam: Done Pneumovax: done  Objective:  BP 138/78   Pulse 93   Temp 97.8 F (36.6 C) (Oral)   Ht 6' (1.829 m)   Wt 225 lb (102.1 kg)   SpO2 95%   BMI 30.52 kg/m  General:  Well developed, well nourished, in no apparent distress Skin:  Warm, no pallor or diaphoresis Head:  Normocephalic, atraumatic Eyes:  Pupils equal and round, sclera anicteric without injection  Lungs:  CTAB, no access msc use Cardio:  RRR, no bruits, no LE  edema Musculoskeletal:  Symmetrical muscle groups noted without atrophy or deformity Neuro:  Sensation intact to pinprick on feet Psych: Age appropriate judgment and insight  Procedure note; plantar fascia injection Verbal consent obtained The area of interest was palpated and marked with an otoscope speculum It was then cleaned with alcohol x1 Free spray was then used for topical anesthesia 20 mg of Depo-Medrol with 1 mL of 2% lidocaine without epinephrine was injected with a 30-gauge needle The area was then bandaged This process was repeated on the contralateral side. The patient tolerated the procedure well There were no immediate complications noted  Assessment:   Type 2 diabetes mellitus with hyperglycemia, with long-term current use of insulin (HCC) - Plan: Semaglutide, 1 MG/DOSE, (OZEMPIC, 1 MG/DOSE,) 4 MG/3ML SOPN  Hypogonadism in male  Essential hypertension  Hyperlipidemia, unspecified hyperlipidemia type - Plan: rosuvastatin (CRESTOR) 40 MG tablet  Plantar fasciitis, bilateral - Plan: methylPREDNISolone acetate (DEPO-MEDROL) injection 20 mg, methylPREDNISolone acetate (DEPO-MEDROL) injection 20 mg, PR INJECT TENDON SHEATH/LIGAMENT  PTSD (post-traumatic stress disorder)   Plan:   Chronic, uncontrolled.  Increase Ozempic from 0.5 mg weekly to 1 mg weekly.  Continue Basaglar 80 units twice daily.  Continue empagliflozin metformin 06-998 mg twice daily.  Counseled on diet and exercise. Needs to be  compliant with his medication.  Try to time it with his Ozempic. Continue Zestoretic 10-12.5 mg daily. Triglycerides still elevated but much improved.  Continue Tricor 145 mg daily and rosuvastatin 40 mg daily.  We will add Vascepa if still no improvement in 3 months. Bilateral injections provided today, he reported immediate improvement. Continue bupropion XL 300 mg daily, sertraline 100 mg twice daily as previously ordered, Xanax 1 to 2 mg nightly. F/u in 3 months. The  patient voiced understanding and agreement to the plan.  [place], DO 07/23/20 2:44 PM

## 2020-07-24 DIAGNOSIS — M546 Pain in thoracic spine: Secondary | ICD-10-CM | POA: Diagnosis not present

## 2020-07-24 DIAGNOSIS — M5451 Vertebrogenic low back pain: Secondary | ICD-10-CM | POA: Diagnosis not present

## 2020-07-24 DIAGNOSIS — M9903 Segmental and somatic dysfunction of lumbar region: Secondary | ICD-10-CM | POA: Diagnosis not present

## 2020-07-24 DIAGNOSIS — M9902 Segmental and somatic dysfunction of thoracic region: Secondary | ICD-10-CM | POA: Diagnosis not present

## 2020-08-01 DIAGNOSIS — M546 Pain in thoracic spine: Secondary | ICD-10-CM | POA: Diagnosis not present

## 2020-08-01 DIAGNOSIS — M5451 Vertebrogenic low back pain: Secondary | ICD-10-CM | POA: Diagnosis not present

## 2020-08-01 DIAGNOSIS — M9903 Segmental and somatic dysfunction of lumbar region: Secondary | ICD-10-CM | POA: Diagnosis not present

## 2020-08-01 DIAGNOSIS — M9902 Segmental and somatic dysfunction of thoracic region: Secondary | ICD-10-CM | POA: Diagnosis not present

## 2020-08-02 ENCOUNTER — Other Ambulatory Visit: Payer: Self-pay | Admitting: Family Medicine

## 2020-08-02 ENCOUNTER — Other Ambulatory Visit (HOSPITAL_BASED_OUTPATIENT_CLINIC_OR_DEPARTMENT_OTHER): Payer: Self-pay

## 2020-08-02 DIAGNOSIS — F419 Anxiety disorder, unspecified: Secondary | ICD-10-CM

## 2020-08-02 DIAGNOSIS — F32A Depression, unspecified: Secondary | ICD-10-CM

## 2020-08-02 MED ORDER — AMOXICILLIN 875 MG PO TABS
ORAL_TABLET | ORAL | 0 refills | Status: DC
  Filled 2020-08-02: qty 21, 7d supply, fill #0

## 2020-08-02 MED ORDER — SODIUM FLUORIDE 1.1 % DT CREA
TOPICAL_CREAM | DENTAL | 0 refills | Status: DC
  Filled 2020-08-02: qty 51, 30d supply, fill #0

## 2020-08-02 MED FILL — Insulin Glargine Soln Pen-Injector 100 Unit/ML: SUBCUTANEOUS | 60 days supply | Qty: 120 | Fill #0 | Status: CN

## 2020-08-03 ENCOUNTER — Other Ambulatory Visit (HOSPITAL_BASED_OUTPATIENT_CLINIC_OR_DEPARTMENT_OTHER): Payer: Self-pay

## 2020-08-03 MED ORDER — QUETIAPINE FUMARATE 25 MG PO TABS
ORAL_TABLET | Freq: Every day | ORAL | 2 refills | Status: DC
Start: 2020-08-03 — End: 2020-10-31
  Filled 2020-08-03: qty 30, 30d supply, fill #0
  Filled 2020-08-28: qty 30, 30d supply, fill #1
  Filled 2020-10-02: qty 30, 30d supply, fill #2

## 2020-08-04 ENCOUNTER — Other Ambulatory Visit (HOSPITAL_BASED_OUTPATIENT_CLINIC_OR_DEPARTMENT_OTHER): Payer: Self-pay

## 2020-08-11 ENCOUNTER — Other Ambulatory Visit (HOSPITAL_BASED_OUTPATIENT_CLINIC_OR_DEPARTMENT_OTHER): Payer: Self-pay

## 2020-08-24 DIAGNOSIS — M5451 Vertebrogenic low back pain: Secondary | ICD-10-CM | POA: Diagnosis not present

## 2020-08-24 DIAGNOSIS — M546 Pain in thoracic spine: Secondary | ICD-10-CM | POA: Diagnosis not present

## 2020-08-24 DIAGNOSIS — M9903 Segmental and somatic dysfunction of lumbar region: Secondary | ICD-10-CM | POA: Diagnosis not present

## 2020-08-24 DIAGNOSIS — M9902 Segmental and somatic dysfunction of thoracic region: Secondary | ICD-10-CM | POA: Diagnosis not present

## 2020-08-28 ENCOUNTER — Other Ambulatory Visit: Payer: Self-pay | Admitting: Physician Assistant

## 2020-08-28 ENCOUNTER — Other Ambulatory Visit: Payer: Self-pay | Admitting: Family Medicine

## 2020-08-28 ENCOUNTER — Other Ambulatory Visit (HOSPITAL_BASED_OUTPATIENT_CLINIC_OR_DEPARTMENT_OTHER): Payer: Self-pay

## 2020-08-28 DIAGNOSIS — E785 Hyperlipidemia, unspecified: Secondary | ICD-10-CM

## 2020-08-28 DIAGNOSIS — E113211 Type 2 diabetes mellitus with mild nonproliferative diabetic retinopathy with macular edema, right eye: Secondary | ICD-10-CM

## 2020-08-28 DIAGNOSIS — F419 Anxiety disorder, unspecified: Secondary | ICD-10-CM

## 2020-08-28 DIAGNOSIS — F32A Depression, unspecified: Secondary | ICD-10-CM

## 2020-08-28 MED ORDER — ALPRAZOLAM 1 MG PO TABS
ORAL_TABLET | Freq: Three times a day (TID) | ORAL | 0 refills | Status: DC | PRN
  Filled 2020-08-28: qty 90, 30d supply, fill #0

## 2020-08-28 MED ORDER — FREESTYLE LIBRE 14 DAY SENSOR MISC
0 refills | Status: DC
  Filled 2020-08-28: qty 2, 28d supply, fill #0

## 2020-08-28 MED ORDER — BUPROPION HCL ER (XL) 300 MG PO TB24
ORAL_TABLET | ORAL | 0 refills | Status: DC
  Filled 2020-08-28: qty 90, 90d supply, fill #0

## 2020-08-28 MED ORDER — FENOFIBRATE 145 MG PO TABS
ORAL_TABLET | Freq: Every day | ORAL | 1 refills | Status: DC
  Filled 2020-08-28: qty 90, 90d supply, fill #0
  Filled 2021-02-05: qty 90, 90d supply, fill #1

## 2020-08-28 NOTE — Telephone Encounter (Signed)
Last RF--07/17/2020 -- #90 no refills Last OV--07/21/20

## 2020-08-29 ENCOUNTER — Other Ambulatory Visit (HOSPITAL_BASED_OUTPATIENT_CLINIC_OR_DEPARTMENT_OTHER): Payer: Self-pay

## 2020-08-30 ENCOUNTER — Other Ambulatory Visit (HOSPITAL_BASED_OUTPATIENT_CLINIC_OR_DEPARTMENT_OTHER): Payer: Self-pay

## 2020-09-05 ENCOUNTER — Other Ambulatory Visit (HOSPITAL_BASED_OUTPATIENT_CLINIC_OR_DEPARTMENT_OTHER): Payer: Self-pay

## 2020-09-06 ENCOUNTER — Other Ambulatory Visit (HOSPITAL_BASED_OUTPATIENT_CLINIC_OR_DEPARTMENT_OTHER): Payer: Self-pay

## 2020-09-06 MED ORDER — SODIUM FLUORIDE 1.1 % DT PSTE
PASTE | DENTAL | 0 refills | Status: DC
  Filled 2020-09-06: qty 100, 30d supply, fill #0

## 2020-09-06 MED ORDER — AMOXICILLIN 875 MG PO TABS
ORAL_TABLET | ORAL | 0 refills | Status: DC
  Filled 2020-09-06: qty 21, 7d supply, fill #0

## 2020-09-06 MED FILL — Insulin Glargine Soln Pen-Injector 100 Unit/ML: SUBCUTANEOUS | 60 days supply | Qty: 120 | Fill #0 | Status: AC

## 2020-09-07 ENCOUNTER — Other Ambulatory Visit (HOSPITAL_BASED_OUTPATIENT_CLINIC_OR_DEPARTMENT_OTHER): Payer: Self-pay

## 2020-09-11 ENCOUNTER — Other Ambulatory Visit: Payer: Self-pay | Admitting: Family Medicine

## 2020-09-11 DIAGNOSIS — K649 Unspecified hemorrhoids: Secondary | ICD-10-CM

## 2020-09-11 NOTE — Progress Notes (Signed)
re

## 2020-09-14 ENCOUNTER — Encounter: Payer: Self-pay | Admitting: Podiatry

## 2020-09-14 ENCOUNTER — Other Ambulatory Visit: Payer: Self-pay

## 2020-09-14 ENCOUNTER — Ambulatory Visit: Payer: Federal, State, Local not specified - PPO | Admitting: Podiatry

## 2020-09-14 DIAGNOSIS — M722 Plantar fascial fibromatosis: Secondary | ICD-10-CM | POA: Diagnosis not present

## 2020-09-14 MED ORDER — TRIAMCINOLONE ACETONIDE 10 MG/ML IJ SUSP
20.0000 mg | Freq: Once | INTRAMUSCULAR | Status: AC
  Administered 2020-09-14: 20 mg

## 2020-09-14 NOTE — Progress Notes (Signed)
Subjective:   Patient ID: William [name], male   DOB: 60 y.o.   MRN: [id number]   HPI Patient presents stating that he is improving but needs further injections that he was quite active and he is satisfied with his orthotics that he is use   ROS      Objective:  Physical Exam  Neurovascular status intact acute inflammation of the plantar heel region bilateral moderate flatfoot deformity     Assessment:  Acute Planter fasciitis bilateral with moderate flatfoot     Plan:  Sterile prep we injected the fascial bilateral 3 mg Kenalog 5 mg Xylocaine continue orthotics may require other treatments depending on how long this continues to improve

## 2020-10-02 ENCOUNTER — Other Ambulatory Visit (HOSPITAL_BASED_OUTPATIENT_CLINIC_OR_DEPARTMENT_OTHER): Payer: Self-pay

## 2020-10-02 ENCOUNTER — Other Ambulatory Visit: Payer: Self-pay | Admitting: Family Medicine

## 2020-10-02 DIAGNOSIS — E113211 Type 2 diabetes mellitus with mild nonproliferative diabetic retinopathy with macular edema, right eye: Secondary | ICD-10-CM

## 2020-10-02 DIAGNOSIS — E291 Testicular hypofunction: Secondary | ICD-10-CM

## 2020-10-02 MED ORDER — ALPRAZOLAM 1 MG PO TABS
ORAL_TABLET | Freq: Three times a day (TID) | ORAL | 1 refills | Status: DC | PRN
  Filled 2020-10-02: qty 90, 30d supply, fill #0

## 2020-10-02 MED ORDER — FREESTYLE LIBRE 14 DAY SENSOR MISC
0 refills | Status: DC
  Filled 2020-10-02: qty 2, 28d supply, fill #0

## 2020-10-02 MED ORDER — TESTOSTERONE CYPIONATE 200 MG/ML IM SOLN
INTRAMUSCULAR | 1 refills | Status: DC
Start: 2020-10-02 — End: 2020-10-31
  Filled 2020-10-02: qty 2, 28d supply, fill #0

## 2020-10-02 NOTE — Telephone Encounter (Signed)
Last OV--07/21/2020 Last Alprazolam RF--08/28/2020  #90 no refills.

## 2020-10-03 ENCOUNTER — Other Ambulatory Visit (HOSPITAL_BASED_OUTPATIENT_CLINIC_OR_DEPARTMENT_OTHER): Payer: Self-pay

## 2020-10-04 ENCOUNTER — Telehealth: Payer: Self-pay | Admitting: Family Medicine

## 2020-10-04 NOTE — Telephone Encounter (Signed)
PT called in regarding billing statement. PT was charged a $50 no show fee. PT spoke with billing who advised him to contact us so we could remove the no show fee. He states there was an error on our part and he talked to [place] about it. Date of discrepancy for the office visit was on 4/29. Please advise.

## 2020-10-12 ENCOUNTER — Other Ambulatory Visit (HOSPITAL_BASED_OUTPATIENT_CLINIC_OR_DEPARTMENT_OTHER): Payer: Self-pay

## 2020-10-12 NOTE — Telephone Encounter (Signed)
I spoke with patient and advised him I had requested the no show fee to be removed for DOS 06/01/20.  I emailed [employer] asking fee to be removed.

## 2020-10-26 ENCOUNTER — Ambulatory Visit: Payer: Federal, State, Local not specified - PPO | Admitting: Podiatry

## 2020-10-26 ENCOUNTER — Encounter: Payer: Self-pay | Admitting: Podiatry

## 2020-10-26 ENCOUNTER — Other Ambulatory Visit: Payer: Self-pay

## 2020-10-26 DIAGNOSIS — M722 Plantar fascial fibromatosis: Secondary | ICD-10-CM | POA: Diagnosis not present

## 2020-10-26 MED ORDER — TRIAMCINOLONE ACETONIDE 10 MG/ML IJ SUSP
10.0000 mg | Freq: Once | INTRAMUSCULAR | Status: AC
  Administered 2020-10-26: 10 mg

## 2020-10-27 NOTE — Progress Notes (Signed)
Subjective:   Patient ID: William [name], male   DOB: 60 y.o.   MRN: [id number]   HPI Patient presents stating he was doing pretty well but he felt some type of a pop in his arch left and his heel is still sore but not the same way it is more inflamed.   ROS      Objective:  Physical Exam  Neurovascular status intact with patient that may have tore several fibers of the medial band of the plantar fascia but it still appears to be intact.  He does have an area of acute inflammation in the plantar medial heel and there is quite a bit of soreness associated with this and he is wanting to try to do something temporarily     Assessment:  Possibility that he may have torn fibers of the medial band of the fascia with an inflammatory condition recurring     Plan:  H&P educated him and we are gena do 1 small steroid injection around this area just to reduce the inflammatory complex and then I want him wearing rigid bottom shoes.  I do think long-term this could be of benefit to him and he agrees and understands condition and I educated him on this and carefully did inject the medial band of the fascia more distal 2 mg dexamethasone 5 mg Xylocaine.  I instructed on being easy on this for the next 1 to 2 weeks and if any issues were to occur to let us know and I will reevaluate

## 2020-10-31 ENCOUNTER — Ambulatory Visit: Payer: Federal, State, Local not specified - PPO | Attending: Internal Medicine

## 2020-10-31 ENCOUNTER — Other Ambulatory Visit: Payer: Self-pay | Admitting: Family Medicine

## 2020-10-31 ENCOUNTER — Other Ambulatory Visit (HOSPITAL_BASED_OUTPATIENT_CLINIC_OR_DEPARTMENT_OTHER): Payer: Self-pay

## 2020-10-31 DIAGNOSIS — F32A Depression, unspecified: Secondary | ICD-10-CM

## 2020-10-31 DIAGNOSIS — E113211 Type 2 diabetes mellitus with mild nonproliferative diabetic retinopathy with macular edema, right eye: Secondary | ICD-10-CM

## 2020-10-31 DIAGNOSIS — E291 Testicular hypofunction: Secondary | ICD-10-CM

## 2020-10-31 DIAGNOSIS — E1165 Type 2 diabetes mellitus with hyperglycemia: Secondary | ICD-10-CM

## 2020-10-31 DIAGNOSIS — Z23 Encounter for immunization: Secondary | ICD-10-CM

## 2020-10-31 DIAGNOSIS — Z794 Long term (current) use of insulin: Secondary | ICD-10-CM

## 2020-10-31 MED ORDER — FREESTYLE LIBRE 14 DAY SENSOR MISC
1 refills | Status: DC
  Filled 2020-10-31: qty 2, 28d supply, fill #0
  Filled 2021-01-04: qty 2, 28d supply, fill #1

## 2020-10-31 MED ORDER — TESTOSTERONE CYPIONATE 200 MG/ML IM SOLN
INTRAMUSCULAR | 1 refills | Status: DC
  Filled 2020-10-31: qty 2, 28d supply, fill #0
  Filled 2020-12-04: qty 2, 28d supply, fill #1

## 2020-10-31 MED ORDER — OZEMPIC (1 MG/DOSE) 4 MG/3ML ~~LOC~~ SOPN
1.0000 mg | PEN_INJECTOR | SUBCUTANEOUS | 0 refills | Status: DC
  Filled 2020-10-31: qty 3, 28d supply, fill #0

## 2020-10-31 MED ORDER — INFLUENZA VAC SPLIT QUAD 0.5 ML IM SUSY
PREFILLED_SYRINGE | INTRAMUSCULAR | 0 refills | Status: DC
  Filled 2020-10-31: qty 0.5, 1d supply, fill #0

## 2020-10-31 MED ORDER — SILDENAFIL CITRATE 100 MG PO TABS
ORAL_TABLET | ORAL | 0 refills | Status: DC
  Filled 2020-10-31: qty 30, 30d supply, fill #0

## 2020-10-31 MED ORDER — ALPRAZOLAM 1 MG PO TABS
ORAL_TABLET | Freq: Three times a day (TID) | ORAL | 5 refills | Status: DC | PRN
  Filled 2020-10-31: qty 90, 30d supply, fill #0
  Filled 2020-12-04: qty 90, 30d supply, fill #1
  Filled 2021-01-04: qty 90, 30d supply, fill #2
  Filled 2021-02-05: qty 90, 30d supply, fill #3
  Filled 2021-03-07: qty 90, 30d supply, fill #4
  Filled 2021-04-13: qty 90, 30d supply, fill #5

## 2020-10-31 MED ORDER — QUETIAPINE FUMARATE 25 MG PO TABS
ORAL_TABLET | Freq: Every day | ORAL | 0 refills | Status: DC
Start: 2020-10-31 — End: 2020-12-04
  Filled 2020-10-31: qty 30, 30d supply, fill #0

## 2020-10-31 NOTE — Progress Notes (Signed)
   Covid-19 Vaccination Clinic  Name:  William [name]    MRN: [id number] DOB: 11-Feb-1960  10/31/2020  [name] was observed post Covid-19 immunization for 15 minutes without incident. He was provided with Vaccine Information Sheet and instruction to access the V-Safe system.   [name] was instructed to call 911 with any severe reactions post vaccine: Difficulty breathing  Swelling of face and throat  A fast heartbeat  A bad rash all over body  Dizziness and weakness

## 2020-10-31 NOTE — Telephone Encounter (Signed)
Last OV--07/21/2020 Last RF Alprazolam---#90 with 1 refill on 10/02/2020

## 2020-10-31 NOTE — Telephone Encounter (Signed)
Requesting: testosterone 200mg /mL and alprazolam 1mg   Contract: 01/03/2016 UDS: 06/23/2017 Last Visit: 07/21/2020 Next Visit: None Last Refill on testosterone: 10/02/2020 #69mL and 0RF Last Refill on alprazolam: 10/02/2020 #90 and 0RF  Please Advise

## 2020-11-02 ENCOUNTER — Other Ambulatory Visit (HOSPITAL_BASED_OUTPATIENT_CLINIC_OR_DEPARTMENT_OTHER): Payer: Self-pay

## 2020-11-03 ENCOUNTER — Other Ambulatory Visit (HOSPITAL_BASED_OUTPATIENT_CLINIC_OR_DEPARTMENT_OTHER): Payer: Self-pay

## 2020-11-07 ENCOUNTER — Other Ambulatory Visit (HOSPITAL_BASED_OUTPATIENT_CLINIC_OR_DEPARTMENT_OTHER): Payer: Self-pay

## 2020-11-07 MED ORDER — MODERNA COVID-19 BIVAL BOOSTER 50 MCG/0.5ML IM SUSP
INTRAMUSCULAR | 0 refills | Status: DC
  Filled 2020-11-07: qty 0.5, 1d supply, fill #0

## 2020-11-10 ENCOUNTER — Ambulatory Visit: Payer: Federal, State, Local not specified - PPO | Admitting: Podiatry

## 2020-11-10 ENCOUNTER — Encounter: Payer: Self-pay | Admitting: Podiatry

## 2020-11-10 ENCOUNTER — Other Ambulatory Visit: Payer: Self-pay

## 2020-11-10 ENCOUNTER — Ambulatory Visit (INDEPENDENT_AMBULATORY_CARE_PROVIDER_SITE_OTHER): Payer: Federal, State, Local not specified - PPO

## 2020-11-10 DIAGNOSIS — M7672 Peroneal tendinitis, left leg: Secondary | ICD-10-CM

## 2020-11-10 DIAGNOSIS — M722 Plantar fascial fibromatosis: Secondary | ICD-10-CM

## 2020-11-10 DIAGNOSIS — T148XXA Other injury of unspecified body region, initial encounter: Secondary | ICD-10-CM

## 2020-11-10 MED ORDER — TRIAMCINOLONE ACETONIDE 10 MG/ML IJ SUSP
10.0000 mg | Freq: Once | INTRAMUSCULAR | Status: AC
  Administered 2020-11-10: 10 mg

## 2020-11-10 NOTE — Progress Notes (Signed)
Subjective:   Patient ID: William [name], male   DOB: 60 y.o.   MRN: [id number]   HPI Patient presents stating he still getting some pain in his arch but he is really developing more pain in the outside of his foot and into his lower leg.  States that is been very tender and that he needs to be active as he is helping his mother move currently   ROS      Objective:  Physical Exam  Neurovascular status intact with discomfort that seems to be reducing mildly in the medial fascia where I do think he tore the fascial band of the plantar fascia with quite a bit of discomfort in the lateral foot and the peroneal base and into the lower leg with inflammation     Assessment:  Probability for compensation secondary to medial band tear of the plantar fascia with inflammation of the peroneal insertion and lateral foot lower leg secondary to walking differently     Plan:  H&P x-ray reviewed and I went ahead did sterile prep and injected the lateral side 3 mg Kenalog 5 mg Xylocaine and then went ahead and placed into a air fracture walker to completely immobilize along with aggressive ice therapy.  Patient will be seen back to recheck encouraged to call with questions and I do think the fascial band will eventually heal but it generally takes 8 to 12 weeks for complete recovery  X-rays were negative for signs of a lateral fracture or other bone pathology

## 2020-11-13 ENCOUNTER — Ambulatory Visit: Payer: Federal, State, Local not specified - PPO | Admitting: Podiatry

## 2020-12-04 ENCOUNTER — Other Ambulatory Visit (HOSPITAL_BASED_OUTPATIENT_CLINIC_OR_DEPARTMENT_OTHER): Payer: Self-pay

## 2020-12-04 ENCOUNTER — Other Ambulatory Visit: Payer: Self-pay | Admitting: Family Medicine

## 2020-12-04 DIAGNOSIS — F419 Anxiety disorder, unspecified: Secondary | ICD-10-CM

## 2020-12-04 DIAGNOSIS — I10 Essential (primary) hypertension: Secondary | ICD-10-CM

## 2020-12-04 DIAGNOSIS — F32A Depression, unspecified: Secondary | ICD-10-CM

## 2020-12-04 DIAGNOSIS — Z794 Long term (current) use of insulin: Secondary | ICD-10-CM

## 2020-12-04 MED ORDER — BUPROPION HCL ER (XL) 300 MG PO TB24
ORAL_TABLET | ORAL | 0 refills | Status: DC
  Filled 2020-12-04: qty 90, 90d supply, fill #0

## 2020-12-04 MED ORDER — SILDENAFIL CITRATE 100 MG PO TABS
ORAL_TABLET | ORAL | 0 refills | Status: DC
  Filled 2020-12-04: qty 30, 30d supply, fill #0

## 2020-12-04 MED ORDER — OZEMPIC (1 MG/DOSE) 4 MG/3ML ~~LOC~~ SOPN
1.0000 mg | PEN_INJECTOR | SUBCUTANEOUS | 0 refills | Status: DC
Start: 2020-12-04 — End: 2021-01-04
  Filled 2020-12-04: qty 3, 28d supply, fill #0

## 2020-12-04 MED ORDER — QUETIAPINE FUMARATE 25 MG PO TABS
ORAL_TABLET | Freq: Every day | ORAL | 0 refills | Status: DC
  Filled 2020-12-04: qty 30, 30d supply, fill #0

## 2020-12-04 MED ORDER — SERTRALINE HCL 100 MG PO TABS
ORAL_TABLET | Freq: Two times a day (BID) | ORAL | 1 refills | Status: DC
Start: 2020-12-04 — End: 2021-04-13
  Filled 2020-12-04: qty 180, 90d supply, fill #0
  Filled 2021-01-04 – 2021-03-07 (×2): qty 180, 90d supply, fill #1

## 2020-12-04 MED ORDER — LISINOPRIL-HYDROCHLOROTHIAZIDE 10-12.5 MG PO TABS
1.0000 | ORAL_TABLET | Freq: Every day | ORAL | 0 refills | Status: DC
  Filled 2020-12-04: qty 90, 90d supply, fill #0

## 2020-12-04 NOTE — Telephone Encounter (Signed)
Due for DM visit. Ty.

## 2020-12-05 ENCOUNTER — Other Ambulatory Visit (HOSPITAL_BASED_OUTPATIENT_CLINIC_OR_DEPARTMENT_OTHER): Payer: Self-pay

## 2020-12-06 ENCOUNTER — Other Ambulatory Visit (HOSPITAL_BASED_OUTPATIENT_CLINIC_OR_DEPARTMENT_OTHER): Payer: Self-pay

## 2020-12-07 ENCOUNTER — Other Ambulatory Visit (HOSPITAL_BASED_OUTPATIENT_CLINIC_OR_DEPARTMENT_OTHER): Payer: Self-pay

## 2020-12-07 DIAGNOSIS — M546 Pain in thoracic spine: Secondary | ICD-10-CM | POA: Diagnosis not present

## 2020-12-07 DIAGNOSIS — M5451 Vertebrogenic low back pain: Secondary | ICD-10-CM | POA: Diagnosis not present

## 2020-12-07 DIAGNOSIS — M9902 Segmental and somatic dysfunction of thoracic region: Secondary | ICD-10-CM | POA: Diagnosis not present

## 2020-12-07 DIAGNOSIS — M9903 Segmental and somatic dysfunction of lumbar region: Secondary | ICD-10-CM | POA: Diagnosis not present

## 2020-12-08 ENCOUNTER — Other Ambulatory Visit (HOSPITAL_BASED_OUTPATIENT_CLINIC_OR_DEPARTMENT_OTHER): Payer: Self-pay

## 2020-12-12 ENCOUNTER — Other Ambulatory Visit (HOSPITAL_BASED_OUTPATIENT_CLINIC_OR_DEPARTMENT_OTHER): Payer: Self-pay

## 2020-12-15 ENCOUNTER — Other Ambulatory Visit (HOSPITAL_BASED_OUTPATIENT_CLINIC_OR_DEPARTMENT_OTHER): Payer: Self-pay

## 2020-12-18 ENCOUNTER — Other Ambulatory Visit (HOSPITAL_BASED_OUTPATIENT_CLINIC_OR_DEPARTMENT_OTHER): Payer: Self-pay

## 2020-12-22 ENCOUNTER — Other Ambulatory Visit (HOSPITAL_BASED_OUTPATIENT_CLINIC_OR_DEPARTMENT_OTHER): Payer: Self-pay

## 2020-12-22 ENCOUNTER — Other Ambulatory Visit: Payer: Self-pay

## 2020-12-22 ENCOUNTER — Telehealth: Payer: Self-pay | Admitting: Medical

## 2020-12-22 ENCOUNTER — Ambulatory Visit: Payer: Federal, State, Local not specified - PPO | Admitting: Medical

## 2020-12-22 VITALS — BP 119/60 | HR 100 | Temp 98.2°F | Resp 20 | Ht 72.0 in | Wt 216.0 lb

## 2020-12-22 DIAGNOSIS — J40 Bronchitis, not specified as acute or chronic: Secondary | ICD-10-CM

## 2020-12-22 DIAGNOSIS — E291 Testicular hypofunction: Secondary | ICD-10-CM

## 2020-12-22 DIAGNOSIS — E1165 Type 2 diabetes mellitus with hyperglycemia: Secondary | ICD-10-CM

## 2020-12-22 DIAGNOSIS — R051 Acute cough: Secondary | ICD-10-CM

## 2020-12-22 DIAGNOSIS — S46811A Strain of other muscles, fascia and tendons at shoulder and upper arm level, right arm, initial encounter: Secondary | ICD-10-CM

## 2020-12-22 DIAGNOSIS — E785 Hyperlipidemia, unspecified: Secondary | ICD-10-CM | POA: Diagnosis not present

## 2020-12-22 DIAGNOSIS — Z794 Long term (current) use of insulin: Secondary | ICD-10-CM

## 2020-12-22 MED ORDER — AZITHROMYCIN 250 MG PO TABS
ORAL_TABLET | ORAL | 0 refills | Status: AC
  Filled 2020-12-22: qty 6, 5d supply, fill #0

## 2020-12-22 MED ORDER — FLUTICASONE PROPIONATE 50 MCG/ACT NA SUSP
2.0000 | Freq: Every day | NASAL | 1 refills | Status: DC
  Filled 2020-12-22: qty 16, 30d supply, fill #0
  Filled 2021-04-13: qty 16, 30d supply, fill #1

## 2020-12-22 MED ORDER — PREVIDENT 5000 SENSITIVE 1.1-5 % DT GEL
DENTAL | 1 refills | Status: DC
  Filled 2020-12-22: qty 100, 30d supply, fill #0
  Filled 2021-01-04 – 2021-01-30 (×2): qty 100, 30d supply, fill #1

## 2020-12-22 MED ORDER — BENZONATATE 100 MG PO CAPS
100.0000 mg | ORAL_CAPSULE | Freq: Three times a day (TID) | ORAL | 0 refills | Status: DC | PRN
  Filled 2020-12-22: qty 30, 10d supply, fill #0
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
## 2020-12-22 NOTE — Telephone Encounter (Signed)
Free style libre II. Pt has frees style 1. He has upgrade. Repeating his ac1 today

## 2021-01-04 ENCOUNTER — Other Ambulatory Visit: Payer: Self-pay

## 2021-01-04 ENCOUNTER — Other Ambulatory Visit: Payer: Self-pay | Admitting: Family Medicine

## 2021-01-04 ENCOUNTER — Other Ambulatory Visit (HOSPITAL_BASED_OUTPATIENT_CLINIC_OR_DEPARTMENT_OTHER): Payer: Self-pay

## 2021-01-04 ENCOUNTER — Other Ambulatory Visit (INDEPENDENT_AMBULATORY_CARE_PROVIDER_SITE_OTHER): Payer: Federal, State, Local not specified - PPO

## 2021-01-04 DIAGNOSIS — Z794 Long term (current) use of insulin: Secondary | ICD-10-CM

## 2021-01-04 DIAGNOSIS — E291 Testicular hypofunction: Secondary | ICD-10-CM

## 2021-01-04 DIAGNOSIS — E1165 Type 2 diabetes mellitus with hyperglycemia: Secondary | ICD-10-CM

## 2021-01-04 DIAGNOSIS — E785 Hyperlipidemia, unspecified: Secondary | ICD-10-CM | POA: Diagnosis not present

## 2021-01-04 DIAGNOSIS — F32A Depression, unspecified: Secondary | ICD-10-CM

## 2021-01-04 MED ORDER — OZEMPIC (1 MG/DOSE) 4 MG/3ML ~~LOC~~ SOPN
1.0000 mg | PEN_INJECTOR | SUBCUTANEOUS | 0 refills | Status: DC
  Filled 2021-01-04: qty 3, 28d supply, fill #0

## 2021-01-04 NOTE — Addendum Note (Signed)
Addended by: Kelle Darting A on: 01/04/2021 03:26 PM   Modules accepted: Orders

## 2021-01-05 ENCOUNTER — Other Ambulatory Visit (HOSPITAL_BASED_OUTPATIENT_CLINIC_OR_DEPARTMENT_OTHER): Payer: Self-pay

## 2021-01-05 ENCOUNTER — Other Ambulatory Visit: Payer: Self-pay | Admitting: Family Medicine

## 2021-01-05 DIAGNOSIS — Z794 Long term (current) use of insulin: Secondary | ICD-10-CM

## 2021-01-05 DIAGNOSIS — E1165 Type 2 diabetes mellitus with hyperglycemia: Secondary | ICD-10-CM

## 2021-01-05 DIAGNOSIS — R7989 Other specified abnormal findings of blood chemistry: Secondary | ICD-10-CM

## 2021-01-05 LAB — COMPREHENSIVE METABOLIC PANEL
ALT: 24 U/L (ref 0–53)
AST: 16 U/L (ref 0–37)
Albumin: 4.4 g/dL (ref 3.5–5.2)
Alkaline Phosphatase: 73 U/L (ref 39–117)
BUN: 21 mg/dL (ref 6–23)
CO2: 26 mEq/L (ref 19–32)
Calcium: 9 mg/dL (ref 8.4–10.5)
Chloride: 101 mEq/L (ref 96–112)
Creatinine, Ser: 0.87 mg/dL (ref 0.40–1.50)
GFR: 92.43 mL/min (ref >60.00)
Glucose, Bld: 376 mg/dL — ABNORMAL HIGH (ref 70–99)
Potassium: 4.5 mEq/L (ref 3.5–5.1)
Sodium: 134 mEq/L — ABNORMAL LOW (ref 135–145)
Total Bilirubin: 0.4 mg/dL (ref 0.2–1.2)
Total Protein: 6.7 g/dL (ref 6.0–8.3)

## 2021-01-05 LAB — LIPID PANEL
Cholesterol: 123 mg/dL (ref 0–200)
HDL: 24 mg/dL — ABNORMAL LOW (ref >39.00)
Total CHOL/HDL Ratio: 5
Triglycerides: 500 mg/dL — ABNORMAL HIGH (ref 0.0–149.0)

## 2021-01-05 LAB — LDL CHOLESTEROL, DIRECT: Direct LDL: 58 mg/dL

## 2021-01-05 LAB — HEMOGLOBIN A1C: Hgb A1c MFr Bld: 9.4 % — ABNORMAL HIGH (ref 4.6–6.5)

## 2021-01-05 LAB — TESTOSTERONE: Testosterone: 112.46 ng/dL — ABNORMAL LOW (ref 300.00–890.00)

## 2021-01-05 MED ORDER — BUPROPION HCL ER (XL) 300 MG PO TB24
ORAL_TABLET | ORAL | 0 refills | Status: DC
  Filled 2021-01-05: qty 90, fill #0
  Filled 2021-03-07: qty 90, 90d supply, fill #0

## 2021-01-05 MED ORDER — TESTOSTERONE CYPIONATE 200 MG/ML IM SOLN
INTRAMUSCULAR | 1 refills | Status: DC
  Filled 2021-01-05: qty 2, 28d supply, fill #0
  Filled 2021-01-05: qty 2, fill #0
  Filled 2021-02-06: qty 2, 28d supply, fill #1

## 2021-01-08 ENCOUNTER — Encounter: Payer: Self-pay | Admitting: Family Medicine

## 2021-01-09 ENCOUNTER — Ambulatory Visit: Payer: Federal, State, Local not specified - PPO | Admitting: Family Medicine

## 2021-01-09 ENCOUNTER — Ambulatory Visit (HOSPITAL_BASED_OUTPATIENT_CLINIC_OR_DEPARTMENT_OTHER)
Admission: RE | Admit: 2021-01-09 | Discharge: 2021-01-09 | Disposition: A | Discharge status: Home / Self Care | Payer: Federal, State, Local not specified - PPO | Source: Ambulatory Visit | Attending: Family Medicine | Admitting: Family Medicine

## 2021-01-09 ENCOUNTER — Encounter: Payer: Self-pay | Admitting: Internal Medicine

## 2021-01-09 ENCOUNTER — Other Ambulatory Visit (HOSPITAL_BASED_OUTPATIENT_CLINIC_OR_DEPARTMENT_OTHER): Payer: Self-pay

## 2021-01-09 ENCOUNTER — Ambulatory Visit: Payer: Self-pay

## 2021-01-09 ENCOUNTER — Encounter: Payer: Self-pay | Admitting: Family Medicine

## 2021-01-09 ENCOUNTER — Other Ambulatory Visit: Payer: Self-pay

## 2021-01-09 VITALS — BP 108/70 | Ht 72.0 in | Wt 216.0 lb

## 2021-01-09 DIAGNOSIS — M4802 Spinal stenosis, cervical region: Secondary | ICD-10-CM | POA: Diagnosis not present

## 2021-01-09 DIAGNOSIS — M898X1 Other specified disorders of bone, shoulder: Secondary | ICD-10-CM | POA: Diagnosis not present

## 2021-01-09 DIAGNOSIS — M5412 Radiculopathy, cervical region: Secondary | ICD-10-CM | POA: Insufficient documentation

## 2021-01-09 DIAGNOSIS — Z041 Encounter for examination and observation following transport accident: Secondary | ICD-10-CM | POA: Diagnosis not present

## 2021-01-09 DIAGNOSIS — M47812 Spondylosis without myelopathy or radiculopathy, cervical region: Secondary | ICD-10-CM | POA: Diagnosis not present

## 2021-01-09 DIAGNOSIS — M7551 Bursitis of right shoulder: Secondary | ICD-10-CM | POA: Insufficient documentation
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
## 2021-01-09 NOTE — Patient Instructions (Signed)
Nice to meet you Please try heat  Please try a thera cane on the area  I will call with the results.  Please try physical therapy   Please send me a message in MyChart with any questions or updates.  Please see me back in 4 weeks.   --Dr. Raeford Razor
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
## 2021-01-10 ENCOUNTER — Telehealth: Payer: Self-pay | Admitting: Family Medicine

## 2021-01-10 ENCOUNTER — Ambulatory Visit: Payer: Federal, State, Local not specified - PPO | Admitting: Family Medicine

## 2021-01-10 NOTE — Telephone Encounter (Signed)
Informed of results.   Rosemarie Ax, MD [facility] 01/10/2021, 2:22 PM

## 2021-01-17 ENCOUNTER — Encounter: Payer: Self-pay | Admitting: Family Medicine

## 2021-01-17 ENCOUNTER — Ambulatory Visit: Payer: Federal, State, Local not specified - PPO | Admitting: Family Medicine

## 2021-01-17 ENCOUNTER — Other Ambulatory Visit: Payer: Self-pay

## 2021-01-17 ENCOUNTER — Ambulatory Visit: Payer: Federal, State, Local not specified - PPO | Attending: Family Medicine | Admitting: Physical Therapy

## 2021-01-17 ENCOUNTER — Other Ambulatory Visit (HOSPITAL_BASED_OUTPATIENT_CLINIC_OR_DEPARTMENT_OTHER): Payer: Self-pay

## 2021-01-17 VITALS — BP 120/68 | HR 116 | Temp 97.5°F | Ht 72.0 in | Wt 218.4 lb

## 2021-01-17 DIAGNOSIS — M898X1 Other specified disorders of bone, shoulder: Secondary | ICD-10-CM | POA: Diagnosis not present

## 2021-01-17 DIAGNOSIS — R293 Abnormal posture: Secondary | ICD-10-CM | POA: Diagnosis not present

## 2021-01-17 DIAGNOSIS — S21201A Unspecified open wound of right back wall of thorax without penetration into thoracic cavity, initial encounter: Secondary | ICD-10-CM

## 2021-01-17 DIAGNOSIS — R252 Cramp and spasm: Secondary | ICD-10-CM

## 2021-01-17 DIAGNOSIS — M542 Cervicalgia: Secondary | ICD-10-CM | POA: Diagnosis not present

## 2021-01-17 DIAGNOSIS — M5412 Radiculopathy, cervical region: Secondary | ICD-10-CM | POA: Insufficient documentation

## 2021-01-17 DIAGNOSIS — R600 Localized edema: Secondary | ICD-10-CM | POA: Diagnosis not present

## 2021-01-17 DIAGNOSIS — M5413 Radiculopathy, cervicothoracic region: Secondary | ICD-10-CM

## 2021-01-17 DIAGNOSIS — M25511 Pain in right shoulder: Secondary | ICD-10-CM | POA: Diagnosis not present

## 2021-01-17 DIAGNOSIS — Z634 Disappearance and death of family member: Secondary | ICD-10-CM

## 2021-01-17 DIAGNOSIS — M6281 Muscle weakness (generalized): Secondary | ICD-10-CM

## 2021-01-17 MED ORDER — FREESTYLE LIBRE 3 SENSOR MISC
1.0000 | Freq: Three times a day (TID) | 3 refills | Status: DC | PRN
  Filled 2021-01-17: qty 2, 28d supply, fill #0
  Filled 2021-02-06: qty 2, 28d supply, fill #1
  Filled 2021-03-07: qty 2, 28d supply, fill #2
  Filled 2021-04-13: qty 2, 28d supply, fill #3
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
## 2021-01-17 NOTE — Addendum Note (Signed)
Addended by: Colbert Ewing MARIE L on: 01/17/2021 10:10 AM   Modules accepted: Orders
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
## 2021-01-17 NOTE — Patient Instructions (Addendum)
Use triple antibiotic ointment twice daily for the next week. If no improvement, please let me know.   Keep the diet clean and stay active.  Give [place] 2-3 business days to get the results of your labs back.   Ice/cold pack over area for 10-15 min twice daily.  Heat (pad or rice pillow in microwave) over affected area, 10-15 minutes twice daily.   Keep the diet clean and stay active.

## 2021-01-22 ENCOUNTER — Other Ambulatory Visit: Payer: Self-pay

## 2021-01-22 ENCOUNTER — Ambulatory Visit: Payer: Federal, State, Local not specified - PPO | Admitting: Physical Therapy

## 2021-01-22 ENCOUNTER — Encounter: Payer: Self-pay | Admitting: Physical Therapy

## 2021-01-22 DIAGNOSIS — M5413 Radiculopathy, cervicothoracic region: Secondary | ICD-10-CM | POA: Diagnosis not present

## 2021-01-22 DIAGNOSIS — R600 Localized edema: Secondary | ICD-10-CM | POA: Diagnosis not present

## 2021-01-22 DIAGNOSIS — R293 Abnormal posture: Secondary | ICD-10-CM

## 2021-01-22 DIAGNOSIS — M542 Cervicalgia: Secondary | ICD-10-CM

## 2021-01-22 DIAGNOSIS — M6281 Muscle weakness (generalized): Secondary | ICD-10-CM

## 2021-01-22 DIAGNOSIS — M25511 Pain in right shoulder: Secondary | ICD-10-CM | POA: Diagnosis not present

## 2021-01-22 DIAGNOSIS — R252 Cramp and spasm: Secondary | ICD-10-CM | POA: Diagnosis not present

## 2021-01-22 DIAGNOSIS — M898X1 Other specified disorders of bone, shoulder: Secondary | ICD-10-CM | POA: Diagnosis not present

## 2021-01-22 DIAGNOSIS — M5412 Radiculopathy, cervical region: Secondary | ICD-10-CM | POA: Diagnosis not present

## 2021-01-22 NOTE — Patient Instructions (Signed)
Access Code: [email or web address] URL: [email or web address] Date: 01/22/2021 Prepared by: Glenetta Hew  Exercises Seated Scapular Retraction - 2 x daily - 7 x weekly - 1 sets - 10 reps - 5 sec hold Seated Cervical Retraction - 2 x daily - 7 x weekly - 1 sets - 10 reps - 5 sec hold Cervical Extension AROM with Strap - 2 x daily - 7 x weekly - 1 sets - 10 reps Gentle Levator Scapulae Stretch - 2 x daily - 7 x weekly - 1 sets - 3 reps - 10-15 sec hold Seated Upper Trapezius Stretch - 2 x daily - 7 x weekly - 1 sets - 3 reps - 10-15 sec hold Shoulder Rolls in Sitting - 3 x daily - 7 x weekly - 1 sets - 10 reps

## 2021-01-22 NOTE — Therapy (Signed)
[facility] [address]  [place], [place], [address] Phone: [phone]   Fax:  [phone]  Physical Therapy Treatment  Patient Details  Name: William [name] MRN: [id number] Date of Birth: 03/11/1960 Referring Provider (PT): Rosemarie Ax, MD   Encounter Date: 01/22/2021   PT End of Session - 01/22/21 0814     Visit Number 2    Number of Visits 12    Date for PT Re-Evaluation 02/28/21    Authorization Type BCBS    PT Start Time 0801    PT Stop Time 0846    PT Time Calculation (min) 45 min    Activity Tolerance Patient tolerated treatment well    Behavior During Therapy [facility] for tasks assessed/performed             Past Medical History:  Diagnosis Date   Adjustment disorder with mixed anxiety and depressed mood    Arthritis    Asthma    history   Diabetes mellitus    type 2   Elevated blood pressure    history of high blood pressure readings   History of chicken pox    Hypercholesteremia    Hypertension    Internal hemorrhoids    Tubular adenoma of colon     Past Surgical History:  Procedure Laterality Date   ELBOW SURGERY     right elbow 2010, left elbow 08/06/10 [name])   KNEE SURGERY     right knee new ACL- [phone]   KNEE SURGERY     left knee 2003   NASAL SEPTUM SURGERY     1992   PARTIAL HIP ARTHROPLASTY     right hip replacement    There were no vitals filed for this visit.   Subjective Assessment - 01/22/21 0809     Subjective Pt. is very concerned that he is unable to work due to pain.  He has a scab on his back where he says his pain is.  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  He has been seeing chiropractor as well.  Wants documentation to stay out of work.    Pertinent History DM    Limitations Lifting;House hold activities    How long can you sit comfortably? n/a    How long can you stand comfortably? n/a    How long can you walk comfortably?  n/a    Patient Stated Goals Improve pain for return to normal function    Currently in Pain? Yes    Pain Score 5     Pain Location Shoulder    Pain Orientation Right    Pain Onset More than a month ago                               [facility] Adult PT Treatment/Exercise - 01/22/21 0001       Exercises   Exercises Shoulder      Shoulder Exercises: Seated   Other Seated Exercises chin tucks x 10      Shoulder Exercises: Standing   Extension Strengthening    Theraband Level (Shoulder Extension) Level 2 (Red)    Row Strengthening;Both;20 reps;Theraband    Theraband Level (Shoulder Row) Level 2 (Red)      Shoulder Exercises: ROM/Strengthening   Nustep L5 x 6 min   cues to use both arms symmetrically     Shoulder Exercises:  Stretch   Other Shoulder Stretches UT stretch, levator stretch, shoulder rolls, scap squeezes for posture      Manual Therapy   Manual Therapy Soft tissue mobilization    Manual therapy comments Skilled palpation and assessment for TPDN    Soft tissue mobilization STM R UT, levator, cervical multifidi              Trigger Point Dry Needling - 01/22/21 0001     Muscles Treated Head and Neck Upper trapezius;Levator scapulae;Cervical multifidi    Other Dry Needling Reft side, R multifidus c4,c5, c6    Upper Trapezius Response Twitch reponse elicited;Palpable increased muscle length    Levator Scapulae Response Twitch response elicited;Palpable increased muscle length    Cervical multifidi Response Twitch reponse elicited;Palpable increased muscle length                   PT Education - 01/22/21 0859     Education Details Issued initial HEP, educated to perform small movements initially and stop if tingling worsened in arm.  Educated on importance of postural strengthening.  Educated on use of OTC pain medications for pain    Person(s) Educated Patient    Methods Explanation;Demonstration    Comprehension Verbalized  understanding;Returned demonstration                 PT Long Term Goals - 01/22/21 0815       PT LONG TERM GOAL #1   Title Pt will be independent with HEP    Time 6    Period Weeks    Status On-going    Target Date 02/28/21      PT LONG TERM GOAL #2   Title Pt will report at least 75% decrease in his pain with all activities for safe return to work and home tasks    Time 6    Period Weeks    Status On-going    Target Date 02/28/21      PT LONG TERM GOAL #3   Title Pt will have full pain free ROM in R shoulder and neck    Time 6    Period Weeks    Status On-going    Target Date 02/28/21      PT LONG TERM GOAL #4   Title Pt will have improved FOTO score to at least 56    Time 6    Period Weeks    Status On-going    Target Date 02/28/21                   Plan - 01/22/21 0902     Clinical Impression Statement Pt. reports ongoing neck/R shoulder pain and tingling down arms.  Today started with initial HEP, noted some aggravation of tingling with resisted shoulder exercises, so given only gentle stretches for HEP.  Instructed to perform smaller gentler movements or stop of starts tingling.  Manual therapy and DN performed end of session, noted decreased tightness in UT and improve ROM following.  Note provided for work.    Personal Factors and Comorbidities Age;Time since onset of injury/illness/exacerbation    Examination-Activity Limitations Reach Overhead;Lift    Examination-Participation Restrictions Occupation;Community Activity;Driving;Yard Work    Merchant [employer] officer Evolving/Moderate complexity    Rehab Potential Good    PT Frequency 2x / week    PT Duration 6 weeks    PT Treatment/Interventions ADLs/Self Care Home Management;Cryotherapy;Electrical Stimulation;Iontophoresis 4mg /ml Dexamethasone;Moist Heat;Traction;Functional mobility training;Therapeutic activities;Therapeutic exercise;Neuromuscular re-education;Manual  techniques;Orthotic Fit/Training;Patient/family education;Dry needling;Passive  range of motion;Taping;Vasopneumatic Device;Spinal Manipulations    PT Next Visit Plan Assess response to TPDN. Manual therapy and TPDN as indicated for cervical and thoracic spine and periscapular musculature (Consider TPDN with e-stim, pt is okay with aggressive treatment). Initiate neck and shoulder stretching and strengthening for postural stability. Consider traction (pt had good response to manual traction).    PT Home Exercise Plan Discussed self care with self massage/TPR at home, ice/heat    Consulted and Agree with Plan of Care Patient             Patient will benefit from skilled therapeutic intervention in order to improve the following deficits and impairments:  Decreased range of motion, Increased fascial restricitons, Increased muscle spasms, Impaired UE functional use, Pain, Improper body mechanics, Decreased activity tolerance, Decreased mobility, Decreased strength, Impaired sensation, Postural dysfunction  Visit Diagnosis: Cervicalgia  Acute pain of right shoulder  Radiculopathy, cervicothoracic region  Cramp and spasm  Abnormal posture  Muscle weakness (generalized)  Localized edema     Problem List Patient Active Problem List   Diagnosis Date Noted   Cervical radiculopathy 01/09/2021   Periscapular pain 01/09/2021   Presbycusis of both ears 11/13/2018   Need for shingles vaccine 10/28/2018   Need for immunization against influenza 10/31/2017   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 associated with type 2 diabetes mellitus ([place]) 08/12/2017   Prostate cancer screening 07/04/2017   Anxiety and depression 12/11/2016   Class 1 obesity with serious comorbidity and body mass index (BMI) of 31.0 to 31.9 in adult 03/04/2016   Visit for preventive health examination 10/24/2015   Erectile dysfunction 01/31/2013   Hypogonadism in male 07/21/2012    Type 2 diabetes mellitus with hyperglycemia, with long-term current use of insulin ([place]) 11/24/2009   Hyperlipemia 11/24/2009   Essential hypertension 11/24/2009    Rennie Natter, PT, DPT 01/22/2021, 9:05 AM  [facility] [address]  [place], [place], [address] Phone: [phone]   Fax:  [phone]  Name: William [name] MRN: [id number] Date of Birth: [birth year]

## 2021-01-25 ENCOUNTER — Ambulatory Visit: Payer: Self-pay

## 2021-01-25 ENCOUNTER — Ambulatory Visit: Payer: Federal, State, Local not specified - PPO | Admitting: Family Medicine

## 2021-01-25 VITALS — BP 110/68 | Ht 72.0 in | Wt 215.0 lb

## 2021-01-25 DIAGNOSIS — M7551 Bursitis of right shoulder: Secondary | ICD-10-CM | POA: Diagnosis not present

## 2021-01-25 DIAGNOSIS — M5412 Radiculopathy, cervical region: Secondary | ICD-10-CM | POA: Diagnosis not present

## 2021-01-25 MED ORDER — METHYLPREDNISOLONE ACETATE 40 MG/ML IJ SUSP
40.0000 mg | Freq: Once | INTRAMUSCULAR | Status: AC
  Administered 2021-01-25: 16:00:00 40 mg via INTRA_ARTICULAR

## 2021-01-25 MED ORDER — METHYLPREDNISOLONE ACETATE 80 MG/ML IJ SUSP: 80.0000 mg | Freq: Once | INTRAMUSCULAR | Status: DC

## 2021-01-25 NOTE — Addendum Note (Signed)
Addended by: Cresenciano Lick on: 01/25/2021 03:45 PM   Modules accepted: Orders
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
## 2021-01-25 NOTE — Patient Instructions (Signed)
Good to see you Please continue heat  Please continue physical therapy   Please send me a message in MyChart with any questions or updates.  Please see me back in 2-3 weeks.   --Dr. Raeford Razor
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
## 2021-01-30 ENCOUNTER — Encounter: Payer: Self-pay | Admitting: Physical Therapy

## 2021-01-30 ENCOUNTER — Ambulatory Visit: Payer: Federal, State, Local not specified - PPO | Admitting: Physical Therapy

## 2021-01-30 ENCOUNTER — Other Ambulatory Visit: Payer: Self-pay

## 2021-01-30 ENCOUNTER — Other Ambulatory Visit (HOSPITAL_BASED_OUTPATIENT_CLINIC_OR_DEPARTMENT_OTHER): Payer: Self-pay

## 2021-01-30 DIAGNOSIS — M6281 Muscle weakness (generalized): Secondary | ICD-10-CM

## 2021-01-30 DIAGNOSIS — M5413 Radiculopathy, cervicothoracic region: Secondary | ICD-10-CM

## 2021-01-30 DIAGNOSIS — M898X1 Other specified disorders of bone, shoulder: Secondary | ICD-10-CM | POA: Diagnosis not present

## 2021-01-30 DIAGNOSIS — M25511 Pain in right shoulder: Secondary | ICD-10-CM

## 2021-01-30 DIAGNOSIS — M5412 Radiculopathy, cervical region: Secondary | ICD-10-CM | POA: Diagnosis not present

## 2021-01-30 DIAGNOSIS — R293 Abnormal posture: Secondary | ICD-10-CM | POA: Diagnosis not present

## 2021-01-30 DIAGNOSIS — M542 Cervicalgia: Secondary | ICD-10-CM

## 2021-01-30 DIAGNOSIS — R600 Localized edema: Secondary | ICD-10-CM | POA: Diagnosis not present

## 2021-01-30 DIAGNOSIS — R252 Cramp and spasm: Secondary | ICD-10-CM | POA: Diagnosis not present

## 2021-01-30 NOTE — Therapy (Addendum)
PHYSICAL THERAPY DISCHARGE SUMMARY  Visits from Start of Care: 3  Current functional level related to goals / functional outcomes: See note below   Remaining deficits: See note below   Education / Equipment: HEP  Plan: Patient agrees to discharge.  Patient goals were not met. Patient was told by lawyer cannot see both chiropractor and PT for same issue, and chose to cancel all PT appointments.    Rennie Natter, PT, DPT 02/07/2021    [facility] [address]  [place], [place], [address] Phone: [phone]   Fax:  [phone]  Physical Therapy Treatment  Patient Details  Name: William [name] MRN: [id number] Date of Birth: 08/30/1960 Referring Provider (PT): Rosemarie Ax, MD   Encounter Date: 01/30/2021   PT End of Session - 01/30/21 0843     Visit Number 3    Number of Visits 12    Date for PT Re-Evaluation 02/28/21    Authorization Type BCBS    PT Start Time 0845    PT Stop Time 0930    PT Time Calculation (min) 45 min    Activity Tolerance Patient tolerated treatment well    Behavior During Therapy [facility] for tasks assessed/performed             Past Medical History:  Diagnosis Date   Adjustment disorder with mixed anxiety and depressed mood    Arthritis    Asthma    history   Diabetes mellitus    type 2   Elevated blood pressure    history of high blood pressure readings   History of chicken pox    Hypercholesteremia    Hypertension    Internal hemorrhoids    Tubular adenoma of colon     Past Surgical History:  Procedure Laterality Date   ELBOW SURGERY     right elbow 2010, left elbow 08/06/10 [name])   KNEE SURGERY     right knee new ACL- [phone]   KNEE SURGERY     left knee 2003   NASAL SEPTUM SURGERY     1992   PARTIAL HIP ARTHROPLASTY     right hip replacement    There were no vitals filed for this visit.   Subjective Assessment - 01/30/21 0845      Subjective Pt. reports he had a cortisone shot on Thursday to his R shoulder, but doesn't think it has helped.  He has been seeing chiropractor as well, which helps his neck. He has also been getting dry needling at chiro as well.  Feels neck ROM is improving.    Currently in Pain? Yes    Pain Score 3     Pain Location Shoulder                               [facility] Adult PT Treatment/Exercise - 01/30/21 0001       Shoulder Exercises: Seated   Other Seated Exercises trialed radial nerve glides, poor tolerance today    Other Seated Exercises chin tucks x 10, scap squeezes x 10 - started with RTB but decreased to isometric after complaint radicular symptoms.      Shoulder Exercises: ROM/Strengthening   UBE (Upper Arm Bike) L1 x 5 min      Shoulder Exercises: Stretch   Other Shoulder Stretches UT stretch, levator stretch, shoulder rolls, scap squeezes for posture  Manual Therapy   Manual Therapy Soft tissue mobilization    Manual therapy comments Skilled palpation and assessment for TPDN    Soft tissue mobilization STM R UT, levator, cervical multifidi              Trigger Point Dry Needling - 01/30/21 0001     Muscles Treated Head and Neck Upper trapezius;Levator scapulae;Cervical multifidi    Other Dry Needling R side, R multifidus C5, C6, C7    Upper Trapezius Response Twitch reponse elicited;Palpable increased muscle length    Levator Scapulae Response Twitch response elicited;Palpable increased muscle length    Cervical multifidi Response Twitch reponse elicited;Palpable increased muscle length                        PT Long Term Goals - 01/22/21 0815       PT LONG TERM GOAL #1   Title Pt will be independent with HEP    Time 6    Period Weeks    Status On-going    Target Date 02/28/21      PT LONG TERM GOAL #2   Title Pt will report at least 75% decrease in his pain with all activities for safe return to work and home tasks     Time 6    Period Weeks    Status On-going    Target Date 02/28/21      PT LONG TERM GOAL #3   Title Pt will have full pain free ROM in R shoulder and neck    Time 6    Period Weeks    Status On-going    Target Date 02/28/21      PT LONG TERM GOAL #4   Title Pt will have improved FOTO score to at least 56    Time 6    Period Weeks    Status On-going    Target Date 02/28/21                   Plan - 01/30/21 1149     Clinical Impression Statement Pt. continues to reports neck/R shoulder pain and radicular symptoms down arm to R thumb.   He has not been performing postural exercises/stretches, so reviewed exercises again, educated on performing to tolerance, decrease intensity if starting to have worsening of numbness/tingling.  Needed verbal and tactile throughout to perform exercises correctly.  Trialed nerve glide for nerve root irritation but poorly tolerated today.  At end of session performed manual therapy and dry needling, noted trigger points still throughout cervical multifidi and levator scapulae.  He would benefit from continued skilled therapy.    Personal Factors and Comorbidities Age;Time since onset of injury/illness/exacerbation    Examination-Activity Limitations Reach Overhead;Lift    Examination-Participation Restrictions Occupation;Community Activity;Driving;Yard Work    Merchant navy officer Evolving/Moderate complexity    Rehab Potential Good    PT Frequency 2x / week    PT Duration 6 weeks    PT Treatment/Interventions ADLs/Self Care Home Management;Cryotherapy;Electrical Stimulation;Iontophoresis 25m/ml Dexamethasone;Moist Heat;Traction;Functional mobility training;Therapeutic activities;Therapeutic exercise;Neuromuscular re-education;Manual techniques;Orthotic Fit/Training;Patient/family education;Dry needling;Passive range of motion;Taping;Vasopneumatic Device;Spinal Manipulations    PT Next Visit Plan Assess response to TPDN. Manual  therapy and TPDN as indicated for cervical and thoracic spine and periscapular musculature (Consider TPDN with e-stim, pt is okay with aggressive treatment). Initiate neck and shoulder stretching and strengthening for postural stability. Consider traction (pt had good response to manual traction).    PT Home Exercise Plan Discussed  self care with self massage/TPR at home, ice/heat    Consulted and Agree with Plan of Care Patient             Patient will benefit from skilled therapeutic intervention in order to improve the following deficits and impairments:  Decreased range of motion, Increased fascial restricitons, Increased muscle spasms, Impaired UE functional use, Pain, Improper body mechanics, Decreased activity tolerance, Decreased mobility, Decreased strength, Impaired sensation, Postural dysfunction  Visit Diagnosis: Cervicalgia  Acute pain of right shoulder  Radiculopathy, cervicothoracic region  Cramp and spasm  Abnormal posture  Muscle weakness (generalized)  Localized edema     Problem List Patient Active Problem List   Diagnosis Date Noted   Cervical radiculopathy 01/09/2021   Subacromial bursitis of right shoulder joint 01/09/2021   Presbycusis of both ears 11/13/2018   Need for shingles vaccine 10/28/2018   Need for immunization against influenza 10/31/2017   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  [place], [place], [address] Phone: [phone]   Fax:  [phone]  Name: William [name] MRN: [id number] Date of Birth: 10-Dec-1960

## 2021-01-31 ENCOUNTER — Other Ambulatory Visit (HOSPITAL_BASED_OUTPATIENT_CLINIC_OR_DEPARTMENT_OTHER): Payer: Self-pay

## 2021-02-02 ENCOUNTER — Ambulatory Visit: Payer: Federal, State, Local not specified - PPO | Admitting: Physical Therapy

## 2021-02-05 ENCOUNTER — Encounter: Payer: Self-pay | Admitting: Family Medicine

## 2021-02-05 ENCOUNTER — Other Ambulatory Visit: Payer: Self-pay | Admitting: Family Medicine

## 2021-02-05 ENCOUNTER — Other Ambulatory Visit (HOSPITAL_BASED_OUTPATIENT_CLINIC_OR_DEPARTMENT_OTHER): Payer: Self-pay

## 2021-02-05 ENCOUNTER — Other Ambulatory Visit: Payer: Self-pay | Admitting: Physician Assistant

## 2021-02-05 DIAGNOSIS — E119 Type 2 diabetes mellitus without complications: Secondary | ICD-10-CM

## 2021-02-05 DIAGNOSIS — F419 Anxiety disorder, unspecified: Secondary | ICD-10-CM

## 2021-02-05 DIAGNOSIS — F32A Depression, unspecified: Secondary | ICD-10-CM

## 2021-02-05 DIAGNOSIS — I10 Essential (primary) hypertension: Secondary | ICD-10-CM

## 2021-02-05 MED ORDER — SILDENAFIL CITRATE 100 MG PO TABS
ORAL_TABLET | ORAL | 0 refills | Status: DC
  Filled 2021-02-05: qty 30, 30d supply, fill #0

## 2021-02-05 MED ORDER — QUETIAPINE FUMARATE 25 MG PO TABS
ORAL_TABLET | Freq: Every day | ORAL | 0 refills | Status: DC
  Filled 2021-02-05: qty 30, 30d supply, fill #0

## 2021-02-05 MED ORDER — LISINOPRIL-HYDROCHLOROTHIAZIDE 10-12.5 MG PO TABS
1.0000 | ORAL_TABLET | Freq: Every day | ORAL | 0 refills | Status: DC
  Filled 2021-02-05 – 2021-03-07 (×2): qty 90, 90d supply, fill #0

## 2021-02-05 MED FILL — Insulin Glargine Soln Pen-Injector 100 Unit/ML: SUBCUTANEOUS | 60 days supply | Qty: 120 | Fill #1 | Status: CN

## 2021-02-06 ENCOUNTER — Other Ambulatory Visit (HOSPITAL_BASED_OUTPATIENT_CLINIC_OR_DEPARTMENT_OTHER): Payer: Self-pay

## 2021-02-06 ENCOUNTER — Telehealth (INDEPENDENT_AMBULATORY_CARE_PROVIDER_SITE_OTHER): Payer: Federal, State, Local not specified - PPO | Admitting: Family Medicine

## 2021-02-06 ENCOUNTER — Telehealth: Payer: Self-pay | Admitting: Family Medicine

## 2021-02-06 ENCOUNTER — Encounter: Payer: Self-pay | Admitting: Family Medicine

## 2021-02-06 DIAGNOSIS — E119 Type 2 diabetes mellitus without complications: Secondary | ICD-10-CM | POA: Diagnosis not present

## 2021-02-06 DIAGNOSIS — Z794 Long term (current) use of insulin: Secondary | ICD-10-CM | POA: Diagnosis not present

## 2021-02-06 DIAGNOSIS — E1165 Type 2 diabetes mellitus with hyperglycemia: Secondary | ICD-10-CM | POA: Diagnosis not present

## 2021-02-06 MED ORDER — OZEMPIC (2 MG/DOSE) 8 MG/3ML ~~LOC~~ SOPN
2.0000 mg | PEN_INJECTOR | SUBCUTANEOUS | 2 refills | Status: DC
  Filled 2021-02-06 – 2021-03-07 (×2): qty 6, 56d supply, fill #0
  Filled 2021-04-13 – 2021-05-10 (×2): qty 6, 56d supply, fill #1
  Filled 2021-06-08: qty 6, 56d supply, fill #2

## 2021-02-06 MED ORDER — TIRZEPATIDE 15 MG/0.5ML ~~LOC~~ SOAJ
15.0000 mg | SUBCUTANEOUS | 2 refills | Status: DC
  Filled 2021-02-06: qty 2, 28d supply, fill #0

## 2021-02-06 MED ORDER — SYNJARDY XR 5-1000 MG PO TB24
ORAL_TABLET | ORAL | 3 refills | Status: DC
  Filled 2021-02-06: qty 60, 30d supply, fill #0

## 2021-02-06 MED ORDER — BASAGLAR KWIKPEN 100 UNIT/ML ~~LOC~~ SOPN: 60.0000 [IU] | PEN_INJECTOR | Freq: Two times a day (BID) | SUBCUTANEOUS | 3 refills | Status: DC

## 2021-02-06 MED ORDER — SILDENAFIL CITRATE 100 MG PO TABS
ORAL_TABLET | ORAL | 2 refills | Status: DC
  Filled 2021-02-06: qty 90, fill #0
  Filled 2021-02-06: qty 30, 30d supply, fill #0
  Filled 2021-03-07: qty 30, 30d supply, fill #1

## 2021-02-06 MED ORDER — EMPAGLIFLOZIN-METFORMIN HCL ER 5-1000 MG PO TB24
2.0000 | ORAL_TABLET | Freq: Every day | ORAL | 1 refills | Status: DC
  Filled 2021-02-06 – 2021-02-13 (×2): qty 180, 90d supply, fill #0

## 2021-02-06 NOTE — Telephone Encounter (Signed)
Pt called and stated that new mediation (mounjaro) that was called in today was not approved by his insurance. He wants to see can he be placed back onto to the previous medication he was on since this was not approved.

## 2021-02-06 NOTE — Progress Notes (Signed)
Chief Complaint  Patient presents with   Medication Problem    Subjective: Patient is a 61 y.o. male here for f/u. Due to COVID-19 pandemic, we are interacting via telephone. I verified patient's ID using 2 identifiers. Patient agreed to proceed with visit via this method. Patient is at home, I am at office. Patient and I are present for visit.   Discussion of DM, noncompliance. Average sugars over past 2 weeks 160's on his CGM. Compliant w Synjardy 06-998 mg bid, Ozempic 1 mg/week, starting to take Lantus 80 u bid. Since compliance, sugars have been improving. Diet healthy. No low's.    Past Medical History:  Diagnosis Date   Adjustment disorder with mixed anxiety and depressed mood    Arthritis    Asthma    history   Diabetes mellitus    type 2   Elevated blood pressure    history of high blood pressure readings   History of chicken pox    Hypercholesteremia    Hypertension    Internal hemorrhoids    Tubular adenoma of colon     Objective: No conversational dyspnea Age appropriate judgment and insight Nml affect and mood  Assessment and Plan: Type 2 diabetes mellitus with hyperglycemia, with long-term current use of insulin (HCC) - Plan: Empagliflozin-metFORMIN HCl ER 06-998 MG TB24, tirzepatide (MOUNJARO) 15 MG/0.5ML Pen  Type 2 diabetes mellitus without complication, without long-term current use of insulin (HCC) - Plan: Insulin Glargine (BASAGLAR KWIKPEN) 100 UNIT/ML  Change Ozempic to Mounjaro 15 mg/week. Let me know if there are cost issues. Cont Synjardy 06-998 mg bid. Decrease Lantus 60 u bid.  Total time: 26 min The patient voiced understanding and agreement to the plan.  [place], DO 02/06/21  12:13 PM

## 2021-02-06 NOTE — Telephone Encounter (Signed)
Ozempic sent

## 2021-02-07 ENCOUNTER — Telehealth: Payer: Self-pay | Admitting: Family Medicine

## 2021-02-07 ENCOUNTER — Other Ambulatory Visit: Payer: Self-pay | Admitting: Physician Assistant

## 2021-02-07 ENCOUNTER — Encounter: Payer: Federal, State, Local not specified - PPO | Admitting: Physical Therapy

## 2021-02-07 ENCOUNTER — Other Ambulatory Visit (HOSPITAL_BASED_OUTPATIENT_CLINIC_OR_DEPARTMENT_OTHER): Payer: Self-pay

## 2021-02-07 NOTE — Telephone Encounter (Signed)
Initiated PA for Synjardy XR 5-1000MG  ER Tablets KEY:  [id number] Waiting response

## 2021-02-07 NOTE — Telephone Encounter (Signed)
Received PA approval for this medication.

## 2021-02-08 ENCOUNTER — Ambulatory Visit: Payer: Federal, State, Local not specified - PPO | Admitting: Family Medicine

## 2021-02-08 ENCOUNTER — Other Ambulatory Visit (HOSPITAL_BASED_OUTPATIENT_CLINIC_OR_DEPARTMENT_OTHER): Payer: Self-pay

## 2021-02-13 ENCOUNTER — Ambulatory Visit: Payer: Federal, State, Local not specified - PPO | Admitting: Family Medicine

## 2021-02-13 ENCOUNTER — Encounter: Payer: Self-pay | Admitting: Family Medicine

## 2021-02-13 ENCOUNTER — Other Ambulatory Visit: Payer: Self-pay | Admitting: Family Medicine

## 2021-02-13 ENCOUNTER — Other Ambulatory Visit (HOSPITAL_BASED_OUTPATIENT_CLINIC_OR_DEPARTMENT_OTHER): Payer: Self-pay

## 2021-02-13 ENCOUNTER — Encounter: Payer: Federal, State, Local not specified - PPO | Admitting: Physical Therapy

## 2021-02-13 VITALS — BP 130/80 | Ht 72.0 in | Wt 215.0 lb

## 2021-02-13 DIAGNOSIS — M5412 Radiculopathy, cervical region: Secondary | ICD-10-CM

## 2021-02-13 MED ORDER — TRUEPLUS 5-BEVEL PEN NEEDLES 29G X 12.7MM MISC
3 refills | Status: DC
  Filled 2021-02-13: qty 100, 90d supply, fill #0
  Filled 2021-03-07: qty 100, 30d supply, fill #0
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
## 2021-02-13 NOTE — Patient Instructions (Signed)
Good to see you Please call [phone] to schedule the MRI   Please send me a message in MyChart with any questions or updates.  We'll setup a virtual visit once the MRI is resulted.   --Dr. Raeford Razor

## 2021-02-14 ENCOUNTER — Other Ambulatory Visit (HOSPITAL_BASED_OUTPATIENT_CLINIC_OR_DEPARTMENT_OTHER): Payer: Self-pay

## 2021-02-15 ENCOUNTER — Other Ambulatory Visit (HOSPITAL_BASED_OUTPATIENT_CLINIC_OR_DEPARTMENT_OTHER): Payer: Self-pay

## 2021-02-16 ENCOUNTER — Encounter: Payer: Federal, State, Local not specified - PPO | Admitting: Physical Therapy

## 2021-02-20 ENCOUNTER — Encounter: Payer: Federal, State, Local not specified - PPO | Admitting: Physical Therapy

## 2021-02-22 ENCOUNTER — Encounter: Payer: Federal, State, Local not specified - PPO | Admitting: Physical Therapy

## 2021-02-23 ENCOUNTER — Encounter: Payer: Self-pay | Admitting: Family Medicine

## 2021-02-26 ENCOUNTER — Encounter: Payer: Federal, State, Local not specified - PPO | Admitting: Physical Therapy

## 2021-02-26 ENCOUNTER — Other Ambulatory Visit (HOSPITAL_BASED_OUTPATIENT_CLINIC_OR_DEPARTMENT_OTHER): Payer: Self-pay

## 2021-03-01 ENCOUNTER — Other Ambulatory Visit (HOSPITAL_BASED_OUTPATIENT_CLINIC_OR_DEPARTMENT_OTHER): Payer: Self-pay

## 2021-03-01 ENCOUNTER — Encounter: Payer: Federal, State, Local not specified - PPO | Admitting: Physical Therapy

## 2021-03-05 ENCOUNTER — Other Ambulatory Visit: Payer: Self-pay

## 2021-03-05 ENCOUNTER — Ambulatory Visit
Admission: RE | Admit: 2021-03-05 | Discharge: 2021-03-05 | Disposition: A | Discharge status: Home / Self Care | Payer: Federal, State, Local not specified - PPO | Source: Ambulatory Visit | Attending: Family Medicine | Admitting: Family Medicine

## 2021-03-05 ENCOUNTER — Encounter: Payer: Federal, State, Local not specified - PPO | Admitting: Physical Therapy

## 2021-03-05 DIAGNOSIS — R200 Anesthesia of skin: Secondary | ICD-10-CM | POA: Diagnosis not present

## 2021-03-05 DIAGNOSIS — M542 Cervicalgia: Secondary | ICD-10-CM | POA: Diagnosis not present

## 2021-03-05 DIAGNOSIS — M5412 Radiculopathy, cervical region: Secondary | ICD-10-CM

## 2021-03-05 DIAGNOSIS — M4802 Spinal stenosis, cervical region: Secondary | ICD-10-CM | POA: Diagnosis not present

## 2021-03-07 ENCOUNTER — Other Ambulatory Visit (HOSPITAL_BASED_OUTPATIENT_CLINIC_OR_DEPARTMENT_OTHER): Payer: Self-pay

## 2021-03-07 ENCOUNTER — Other Ambulatory Visit: Payer: Self-pay | Admitting: Family Medicine

## 2021-03-07 DIAGNOSIS — F419 Anxiety disorder, unspecified: Secondary | ICD-10-CM

## 2021-03-07 MED ORDER — QUETIAPINE FUMARATE 25 MG PO TABS
25.0000 mg | ORAL_TABLET | Freq: Every day | ORAL | 2 refills | Status: DC
  Filled 2021-03-07: qty 90, 90d supply, fill #0
  Filled 2021-04-13 – 2021-06-08 (×2): qty 90, 90d supply, fill #1
  Filled 2021-07-12 – 2021-09-12 (×2): qty 90, 90d supply, fill #2

## 2021-03-07 NOTE — Telephone Encounter (Signed)
Last RF 02/05/21

## 2021-03-08 ENCOUNTER — Telehealth (INDEPENDENT_AMBULATORY_CARE_PROVIDER_SITE_OTHER): Payer: Federal, State, Local not specified - PPO | Admitting: Family Medicine

## 2021-03-08 DIAGNOSIS — M5412 Radiculopathy, cervical region: Secondary | ICD-10-CM | POA: Diagnosis not present
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
## 2021-03-09 ENCOUNTER — Other Ambulatory Visit: Payer: Self-pay | Admitting: Family Medicine

## 2021-03-09 ENCOUNTER — Other Ambulatory Visit (HOSPITAL_BASED_OUTPATIENT_CLINIC_OR_DEPARTMENT_OTHER): Payer: Self-pay

## 2021-03-09 DIAGNOSIS — E119 Type 2 diabetes mellitus without complications: Secondary | ICD-10-CM

## 2021-03-12 ENCOUNTER — Ambulatory Visit
Admission: RE | Admit: 2021-03-12 | Discharge: 2021-03-12 | Disposition: A | Discharge status: Home / Self Care | Payer: Federal, State, Local not specified - PPO | Source: Ambulatory Visit | Attending: Family Medicine | Admitting: Family Medicine

## 2021-03-12 ENCOUNTER — Other Ambulatory Visit (HOSPITAL_BASED_OUTPATIENT_CLINIC_OR_DEPARTMENT_OTHER): Payer: Self-pay

## 2021-03-12 ENCOUNTER — Other Ambulatory Visit: Payer: Self-pay | Admitting: Family Medicine

## 2021-03-12 ENCOUNTER — Other Ambulatory Visit: Payer: Self-pay

## 2021-03-12 DIAGNOSIS — E119 Type 2 diabetes mellitus without complications: Secondary | ICD-10-CM

## 2021-03-12 DIAGNOSIS — M47812 Spondylosis without myelopathy or radiculopathy, cervical region: Secondary | ICD-10-CM | POA: Diagnosis not present

## 2021-03-12 DIAGNOSIS — M5412 Radiculopathy, cervical region: Secondary | ICD-10-CM

## 2021-03-12 MED ORDER — TRIAMCINOLONE ACETONIDE 40 MG/ML IJ SUSP (RADIOLOGY)
60.0000 mg | Freq: Once | INTRAMUSCULAR | Status: AC
  Administered 2021-03-12: 60 mg via EPIDURAL

## 2021-03-12 MED ORDER — IOPAMIDOL (ISOVUE-M 300) INJECTION 61%
1.0000 mL | Freq: Once | INTRAMUSCULAR | Status: AC | PRN
  Administered 2021-03-12: 1 mL via EPIDURAL

## 2021-03-12 MED ORDER — SILDENAFIL CITRATE 100 MG PO TABS
ORAL_TABLET | ORAL | 2 refills | Status: DC
  Filled 2021-03-12: qty 90, fill #0
  Filled 2021-03-12: qty 30, 30d supply, fill #0

## 2021-03-12 NOTE — Discharge Instructions (Signed)

## 2021-03-13 ENCOUNTER — Telehealth: Payer: Self-pay | Admitting: Family Medicine

## 2021-03-13 ENCOUNTER — Other Ambulatory Visit (HOSPITAL_BASED_OUTPATIENT_CLINIC_OR_DEPARTMENT_OTHER): Payer: Self-pay

## 2021-03-13 MED ORDER — TADALAFIL 20 MG PO TABS
20.0000 mg | ORAL_TABLET | Freq: Every day | ORAL | 5 refills | Status: DC | PRN
  Filled 2021-03-13: qty 20, 20d supply, fill #0

## 2021-03-13 NOTE — Telephone Encounter (Signed)
Pharmacy per patient would like to change the Sildenafil to Tadaladil. If ok provide dose and instructions/#of prescribed/RF's

## 2021-03-13 NOTE — Telephone Encounter (Signed)
Sent in

## 2021-03-14 ENCOUNTER — Other Ambulatory Visit (HOSPITAL_BASED_OUTPATIENT_CLINIC_OR_DEPARTMENT_OTHER): Payer: Self-pay

## 2021-03-14 ENCOUNTER — Other Ambulatory Visit: Payer: Self-pay | Admitting: Family Medicine

## 2021-03-14 MED ORDER — TADALAFIL 20 MG PO TABS
20.0000 mg | ORAL_TABLET | Freq: Every day | ORAL | 5 refills | Status: DC | PRN
  Filled 2021-03-14: qty 30, 30d supply, fill #0
  Filled 2021-05-10: qty 30, 30d supply, fill #1
  Filled 2021-06-08: qty 30, 30d supply, fill #2
  Filled 2021-07-12: qty 30, 30d supply, fill #3
  Filled 2021-08-15: qty 30, 30d supply, fill #4
  Filled 2021-09-04 – 2021-09-12 (×2): qty 30, 30d supply, fill #5

## 2021-03-14 NOTE — Telephone Encounter (Signed)
[place] for 30 day supply

## 2021-03-20 ENCOUNTER — Other Ambulatory Visit: Payer: Self-pay | Admitting: Family Medicine

## 2021-03-20 ENCOUNTER — Other Ambulatory Visit (HOSPITAL_BASED_OUTPATIENT_CLINIC_OR_DEPARTMENT_OTHER): Payer: Self-pay

## 2021-03-20 DIAGNOSIS — E119 Type 2 diabetes mellitus without complications: Secondary | ICD-10-CM

## 2021-03-20 MED ORDER — BASAGLAR KWIKPEN 100 UNIT/ML ~~LOC~~ SOPN
PEN_INJECTOR | SUBCUTANEOUS | 3 refills | Status: DC
  Filled 2021-03-20 (×2): qty 120, 60d supply, fill #0

## 2021-03-21 ENCOUNTER — Other Ambulatory Visit (HOSPITAL_BASED_OUTPATIENT_CLINIC_OR_DEPARTMENT_OTHER): Payer: Self-pay

## 2021-03-26 ENCOUNTER — Other Ambulatory Visit (HOSPITAL_BASED_OUTPATIENT_CLINIC_OR_DEPARTMENT_OTHER): Payer: Self-pay

## 2021-03-27 ENCOUNTER — Other Ambulatory Visit: Payer: Self-pay | Admitting: Family Medicine

## 2021-03-27 DIAGNOSIS — M5412 Radiculopathy, cervical region: Secondary | ICD-10-CM

## 2021-03-27 NOTE — Progress Notes (Signed)
Placed epidural for pain.   Rosemarie Ax, MD [facility] 03/27/2021, 1:19 PM

## 2021-04-03 ENCOUNTER — Encounter: Payer: Self-pay | Admitting: Family Medicine

## 2021-04-03 ENCOUNTER — Telehealth (INDEPENDENT_AMBULATORY_CARE_PROVIDER_SITE_OTHER): Payer: Federal, State, Local not specified - PPO | Admitting: Family Medicine

## 2021-04-03 ENCOUNTER — Other Ambulatory Visit (HOSPITAL_BASED_OUTPATIENT_CLINIC_OR_DEPARTMENT_OTHER): Payer: Self-pay

## 2021-04-03 DIAGNOSIS — J40 Bronchitis, not specified as acute or chronic: Secondary | ICD-10-CM | POA: Diagnosis not present

## 2021-04-03 MED ORDER — AZITHROMYCIN 250 MG PO TABS
ORAL_TABLET | ORAL | 0 refills | Status: DC
  Filled 2021-04-03: qty 6, 5d supply, fill #0

## 2021-04-03 NOTE — Progress Notes (Signed)
Chief Complaint  Patient presents with   Cough    congestion    William [name] here for URI complaints. Due to COVID-19 pandemic, we are interacting via web portal for an electronic face-to-face visit. I verified patient's ID using 2 identifiers. Patient agreed to proceed with visit via this method. Patient is at home, I am at office. Patient and I are present for visit.   Duration: 3 days  Associated symptoms: chest tightness and coughing Denies: sinus congestion, sinus pain, rhinorrhea, itchy watery eyes, ear pain, ear drainage, sore throat, wheezing, fevers, myalgias, and shortness of breath Treatment to date: drinking tea Sick contacts: No  Past Medical History:  Diagnosis Date   Adjustment disorder with mixed anxiety and depressed mood    Arthritis    Asthma    history   Diabetes mellitus    type 2   Elevated blood pressure    history of high blood pressure readings   History of chicken pox    Hypercholesteremia    Hypertension    Internal hemorrhoids    Tubular adenoma of colon     Objective No conversational dyspnea Age appropriate judgment and insight Nml affect and mood  Bronchitis - Plan: azithromycin (ZITHROMAX) 250 MG tablet  Wait a few days and then take med if needed. Has done well with this in the past. Continue to push fluids, practice good hand hygiene, cover mouth when coughing. F/u prn. If starting to experience fevers, shaking, or shortness of breath, seek immediate care. Pt voiced understanding and agreement to the plan.  [place], DO 04/03/21 11:47 AM

## 2021-04-04 ENCOUNTER — Other Ambulatory Visit: Payer: Self-pay | Admitting: Medical

## 2021-04-04 ENCOUNTER — Other Ambulatory Visit (HOSPITAL_BASED_OUTPATIENT_CLINIC_OR_DEPARTMENT_OTHER): Payer: Self-pay

## 2021-04-09 ENCOUNTER — Other Ambulatory Visit: Payer: Federal, State, Local not specified - PPO

## 2021-04-13 ENCOUNTER — Other Ambulatory Visit (HOSPITAL_BASED_OUTPATIENT_CLINIC_OR_DEPARTMENT_OTHER): Payer: Self-pay

## 2021-04-13 ENCOUNTER — Other Ambulatory Visit: Payer: Self-pay | Admitting: Family Medicine

## 2021-04-13 DIAGNOSIS — I10 Essential (primary) hypertension: Secondary | ICD-10-CM

## 2021-04-13 DIAGNOSIS — F419 Anxiety disorder, unspecified: Secondary | ICD-10-CM

## 2021-04-13 DIAGNOSIS — F32A Depression, unspecified: Secondary | ICD-10-CM

## 2021-04-13 MED ORDER — LISINOPRIL-HYDROCHLOROTHIAZIDE 10-12.5 MG PO TABS
1.0000 | ORAL_TABLET | Freq: Every day | ORAL | 0 refills | Status: DC
  Filled 2021-04-13 – 2021-06-08 (×2): qty 90, 90d supply, fill #0

## 2021-04-13 MED ORDER — BUPROPION HCL ER (XL) 300 MG PO TB24
ORAL_TABLET | ORAL | 0 refills | Status: DC
  Filled 2021-04-13 – 2021-06-08 (×2): qty 90, 90d supply, fill #0

## 2021-04-13 MED ORDER — SERTRALINE HCL 100 MG PO TABS
100.0000 mg | ORAL_TABLET | Freq: Two times a day (BID) | ORAL | 1 refills | Status: DC
  Filled 2021-04-13: qty 180, fill #0
  Filled 2021-06-08: qty 180, 90d supply, fill #0
  Filled 2021-07-12 – 2021-09-12 (×2): qty 180, 90d supply, fill #1

## 2021-04-16 ENCOUNTER — Ambulatory Visit: Payer: Federal, State, Local not specified - PPO | Admitting: Podiatrist

## 2021-04-16 ENCOUNTER — Other Ambulatory Visit: Payer: Self-pay

## 2021-04-16 DIAGNOSIS — M7752 Other enthesopathy of left foot: Secondary | ICD-10-CM

## 2021-04-16 DIAGNOSIS — M778 Other enthesopathies, not elsewhere classified: Secondary | ICD-10-CM

## 2021-04-16 MED ORDER — TRIAMCINOLONE ACETONIDE 10 MG/ML IJ SUSP
10.0000 mg | Freq: Once | INTRAMUSCULAR | Status: AC
  Administered 2021-04-20: 10 mg

## 2021-04-16 NOTE — Progress Notes (Unsigned)
Chief Complaint  Patient presents with   Foot Pain    Left foot pain on the outside of foot      HPI: Patient is 61 y.o. male who presents today for ***   No Known Allergies  Review of systems is negative except as noted in the HPI.  Denies nausea/ vomiting/ fevers/ chills or night sweats.   Denies difficulty breathing, denies calf pain or tenderness  Physical Exam  Patient is awake, alert, and oriented x 3.  In no acute distress.    Vascular status is intact with palpable pedal pulses DP and PT bilateral and capillary refill time less than 3 seconds bilateral.  No edema or erythema noted.   Neurological exam reveals epicritic and protective sensation grossly intact bilateral.   Dermatological exam reveals skin is supple and dry to bilateral feet.  No open lesions present.    Musculoskeletal exam: Musculature intact with dorsiflexion, plantarflexion, inversion, eversion. Ankle and First MPJ joint range of motion normal.   ***  Assessment: ***  Plan: ***

## 2021-04-17 ENCOUNTER — Ambulatory Visit
Admission: RE | Admit: 2021-04-17 | Discharge: 2021-04-17 | Disposition: A | Discharge status: Home / Self Care | Payer: Federal, State, Local not specified - PPO | Source: Ambulatory Visit | Attending: Family Medicine | Admitting: Family Medicine

## 2021-04-17 DIAGNOSIS — M47812 Spondylosis without myelopathy or radiculopathy, cervical region: Secondary | ICD-10-CM | POA: Diagnosis not present

## 2021-04-17 DIAGNOSIS — M5412 Radiculopathy, cervical region: Secondary | ICD-10-CM

## 2021-04-17 MED ORDER — IOPAMIDOL (ISOVUE-M 300) INJECTION 61%
1.0000 mL | Freq: Once | INTRAMUSCULAR | Status: AC
  Administered 2021-04-17: 1 mL via EPIDURAL

## 2021-04-17 MED ORDER — TRIAMCINOLONE ACETONIDE 40 MG/ML IJ SUSP (RADIOLOGY)
60.0000 mg | Freq: Once | INTRAMUSCULAR | Status: AC
  Administered 2021-04-17: 60 mg via EPIDURAL

## 2021-04-17 NOTE — Discharge Instructions (Signed)

## 2021-04-20 ENCOUNTER — Encounter: Payer: Self-pay | Admitting: Podiatrist

## 2021-04-20 DIAGNOSIS — M778 Other enthesopathies, not elsewhere classified: Secondary | ICD-10-CM

## 2021-05-03 DIAGNOSIS — H903 Sensorineural hearing loss, bilateral: Secondary | ICD-10-CM | POA: Diagnosis not present

## 2021-05-10 ENCOUNTER — Other Ambulatory Visit: Payer: Self-pay | Admitting: Family Medicine

## 2021-05-10 ENCOUNTER — Other Ambulatory Visit (HOSPITAL_BASED_OUTPATIENT_CLINIC_OR_DEPARTMENT_OTHER): Payer: Self-pay

## 2021-05-10 MED ORDER — ALPRAZOLAM 1 MG PO TABS
ORAL_TABLET | Freq: Three times a day (TID) | ORAL | 5 refills | Status: AC | PRN
  Filled 2021-05-10: qty 90, fill #0
  Filled 2021-05-11: qty 90, 30d supply, fill #0
  Filled 2021-06-08: qty 90, 30d supply, fill #1
  Filled 2021-07-12: qty 90, 30d supply, fill #2
  Filled 2021-08-15: qty 90, 30d supply, fill #3
  Filled 2021-09-12: qty 90, 30d supply, fill #4
  Filled 2021-10-09 – 2021-10-12 (×2): qty 90, 30d supply, fill #5

## 2021-05-11 ENCOUNTER — Other Ambulatory Visit (HOSPITAL_BASED_OUTPATIENT_CLINIC_OR_DEPARTMENT_OTHER): Payer: Self-pay

## 2021-05-22 ENCOUNTER — Encounter: Payer: Self-pay | Admitting: Family Medicine

## 2021-05-22 ENCOUNTER — Ambulatory Visit: Payer: Federal, State, Local not specified - PPO | Admitting: Family Medicine

## 2021-05-22 ENCOUNTER — Ambulatory Visit: Payer: Self-pay

## 2021-05-22 VITALS — BP 110/70 | Ht 72.0 in | Wt 215.0 lb

## 2021-05-22 DIAGNOSIS — M778 Other enthesopathies, not elsewhere classified: Secondary | ICD-10-CM

## 2021-05-22 MED ORDER — TRIAMCINOLONE ACETONIDE 40 MG/ML IJ SUSP
40.0000 mg | Freq: Once | INTRAMUSCULAR | Status: AC
  Administered 2021-05-22: 40 mg via INTRA_ARTICULAR

## 2021-05-22 NOTE — Progress Notes (Signed)
?  William [name] - 61 y.o. male MRN [id number]  Date of birth: 01/25/1961 ? ?SUBJECTIVE:  Including CC & ROS.  ?No chief complaint on file. ? ? ?William [name] is a 61 y.o. male that is  here for acute shoulder pain. He is having limitations in external rotation.  He was wanting to get back to his normal activities. ? ? ?Review of Systems ?See HPI  ? ?HISTORY: Past Medical, Surgical, Social, and Family History Reviewed & Updated per EMR.   ?Pertinent Historical Findings include: ? ?Past Medical History:  ?Diagnosis Date  ? Adjustment disorder with mixed anxiety and depressed mood   ? Arthritis   ? Asthma   ? history  ? Diabetes mellitus   ? type 2  ? Elevated blood pressure   ? history of high blood pressure readings  ? History of chicken pox   ? Hypercholesteremia   ? Hypertension   ? Internal hemorrhoids   ? Tubular adenoma of colon   ? ? ?Past Surgical History:  ?Procedure Laterality Date  ? ELBOW SURGERY    ? right elbow 2010, left elbow 08/06/10 [name])  ? KNEE SURGERY    ? right knee new ACL- 1993,2000  ? KNEE SURGERY    ? left knee 2003  ? NASAL SEPTUM SURGERY    ? 1992  ? PARTIAL HIP ARTHROPLASTY    ? right hip replacement  ? ? ? ?PHYSICAL EXAM:  ?VS: BP 110/70 (BP Location: Left Arm, Patient Position: Sitting)   Ht 6' (1.829 m)   Wt 215 lb (97.5 kg)   BMI 29.16 kg/m?  ?Physical Exam ?Gen: NAD, alert, cooperative with exam, well-appearing ?MSK:  ?Neurovascularly intact   ? ? ?Aspiration/Injection Procedure Note ?Ronita Hipps ?Jun 28, 1960 ? ?Procedure: Injection ?Indications: Right shoulder pain ? ?Procedure Details ?Consent: Risks of procedure as well as the alternatives and risks of each were explained to the (patient/caregiver).  Consent for procedure obtained. ?Time Out: Verified patient identification, verified procedure, site/side was marked, verified correct patient position, special equipment/implants available, medications/allergies/relevent history reviewed, required imaging and test results  available.  Performed.  The area was cleaned with iodine and alcohol swabs.   ? ?The right glenohumeral joint was injected using 3 cc of 1% lidocaine on a 22-gauge 3-1/2 inch needle.  The syringe was switched and a mixture containing 3 cc of 1% lidocaine,1 cc's of 40 mg Kenalog and 3 cc's of 0.25% bupivacaine was injected.  Ultrasound was used. Images were obtained in short views showing the injection.   ? ? ?A sterile dressing was applied. ? ?Patient did tolerate procedure well. ? ? ? ? ?ASSESSMENT & PLAN:  ? ?Capsulitis of right shoulder ?Acutely occurring. Having limitations in external rotation.  ?- counseled on home exercise therapy and supportive care ?- injection today  ?- could consider further imaging.  ? ? ? ? ?

## 2021-05-22 NOTE — Patient Instructions (Signed)
Good to see you ?Please use heat before exercise and ice after  ?Please try the exercises   ?Please send me a message in MyChart with any questions or updates.  ?Please see me back in 6 weeks or as needed if better.  ? ?--Dr. Raeford Razor ? ?

## 2021-05-22 NOTE — Assessment & Plan Note (Signed)
Acutely occurring. Having limitations in external rotation.  ?- counseled on home exercise therapy and supportive care ?- injection today  ?- could consider further imaging.  ?

## 2021-06-06 ENCOUNTER — Other Ambulatory Visit (HOSPITAL_BASED_OUTPATIENT_CLINIC_OR_DEPARTMENT_OTHER): Payer: Self-pay

## 2021-06-06 MED ORDER — SODIUM FLUORIDE 5000 PPM 1.1 % DT PSTE
PASTE | DENTAL | 1 refills | Status: DC
  Filled 2021-06-06: qty 100, 30d supply, fill #0
  Filled 2021-07-12: qty 100, 30d supply, fill #1

## 2021-06-08 ENCOUNTER — Other Ambulatory Visit: Payer: Self-pay | Admitting: Family Medicine

## 2021-06-08 ENCOUNTER — Other Ambulatory Visit (HOSPITAL_BASED_OUTPATIENT_CLINIC_OR_DEPARTMENT_OTHER): Payer: Self-pay

## 2021-06-08 MED ORDER — FREESTYLE LIBRE 3 SENSOR MISC
1.0000 | Freq: Three times a day (TID) | 3 refills | Status: DC | PRN
  Filled 2021-06-08: qty 2, 28d supply, fill #0

## 2021-06-13 ENCOUNTER — Other Ambulatory Visit (HOSPITAL_BASED_OUTPATIENT_CLINIC_OR_DEPARTMENT_OTHER): Payer: Self-pay

## 2021-06-18 ENCOUNTER — Encounter: Payer: Self-pay | Admitting: Family Medicine

## 2021-06-18 ENCOUNTER — Ambulatory Visit: Payer: Federal, State, Local not specified - PPO | Admitting: Internal Medicine

## 2021-06-18 ENCOUNTER — Other Ambulatory Visit (HOSPITAL_BASED_OUTPATIENT_CLINIC_OR_DEPARTMENT_OTHER): Payer: Self-pay

## 2021-06-18 ENCOUNTER — Encounter: Payer: Self-pay | Admitting: Internal Medicine

## 2021-06-18 VITALS — BP 110/72 | HR 100 | Ht 72.0 in | Wt 213.0 lb

## 2021-06-18 DIAGNOSIS — E1165 Type 2 diabetes mellitus with hyperglycemia: Secondary | ICD-10-CM | POA: Diagnosis not present

## 2021-06-18 DIAGNOSIS — Z794 Long term (current) use of insulin: Secondary | ICD-10-CM

## 2021-06-18 DIAGNOSIS — E119 Type 2 diabetes mellitus without complications: Secondary | ICD-10-CM

## 2021-06-18 DIAGNOSIS — R739 Hyperglycemia, unspecified: Secondary | ICD-10-CM

## 2021-06-18 DIAGNOSIS — E291 Testicular hypofunction: Secondary | ICD-10-CM | POA: Diagnosis not present

## 2021-06-18 LAB — POCT GLYCOSYLATED HEMOGLOBIN (HGB A1C): Hemoglobin A1C: 7.7 % — AB (ref 4.0–5.6)

## 2021-06-18 MED ORDER — FREESTYLE LIBRE 3 SENSOR MISC
1.0000 | 3 refills | Status: DC
  Filled 2021-06-18: qty 6, fill #0
  Filled 2021-07-12: qty 6, 84d supply, fill #0
  Filled 2021-10-09 – 2021-10-12 (×2): qty 6, 84d supply, fill #1
  Filled 2021-12-07 – 2022-01-09 (×2): qty 6, 84d supply, fill #2
  Filled 2022-04-04: qty 6, 84d supply, fill #3

## 2021-06-18 MED ORDER — INSULIN PEN NEEDLE 29G X 12.7MM MISC
1.0000 | Freq: Every day | 3 refills | Status: DC
  Filled 2021-06-18: qty 100, 100d supply, fill #0

## 2021-06-18 MED ORDER — SYNJARDY 12.5-1000 MG PO TABS
1.0000 | ORAL_TABLET | Freq: Two times a day (BID) | ORAL | 3 refills | Status: DC
  Filled 2021-06-18 – 2021-07-12 (×2): qty 180, 90d supply, fill #0
  Filled 2021-10-29: qty 180, 90d supply, fill #1
  Filled 2022-02-16: qty 180, 90d supply, fill #2

## 2021-06-18 MED ORDER — BASAGLAR KWIKPEN 100 UNIT/ML ~~LOC~~ SOPN
70.0000 [IU] | PEN_INJECTOR | Freq: Every day | SUBCUTANEOUS | 3 refills | Status: DC
  Filled 2021-06-18: qty 63, 90d supply, fill #0
  Filled 2021-07-12: qty 60, 85d supply, fill #0
  Filled 2021-10-29: qty 60, 85d supply, fill #1
  Filled 2022-02-16: qty 60, 85d supply, fill #2

## 2021-06-18 MED ORDER — OZEMPIC (2 MG/DOSE) 8 MG/3ML ~~LOC~~ SOPN
2.0000 mg | PEN_INJECTOR | SUBCUTANEOUS | 3 refills | Status: DC
  Filled 2021-06-18 – 2021-07-12 (×2): qty 9, 84d supply, fill #0
  Filled 2021-09-04 – 2021-10-12 (×3): qty 9, 84d supply, fill #1
  Filled 2022-01-09: qty 3, 28d supply, fill #2
  Filled 2022-02-16: qty 3, 28d supply, fill #3
  Filled 2022-02-25: qty 3, 28d supply, fill #4
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
## 2021-06-18 NOTE — Patient Instructions (Addendum)
Change Synjardy 12.06-998 mg TWO tablets every morning  ?Continue Ozempic 2 mg weekly  ?Decease Basaglar 70 units once daily  ? ? ?Please have your testosterone checked fasting, no later than 8 am  ? ? ?HOW TO TREAT LOW BLOOD SUGARS (Blood sugar LESS THAN 70 MG/DL) ?Please follow the RULE OF 15 for the treatment of hypoglycemia treatment (when your (blood sugars are less than 70 mg/dL)  ? ?STEP 1: Take 15 grams of carbohydrates when your blood sugar is low, which includes:  ?3-4 GLUCOSE TABS  OR ?3-4 OZ OF JUICE OR REGULAR SODA OR ?ONE TUBE OF GLUCOSE GEL   ? ?STEP 2: RECHECK blood sugar in 15 MINUTES ?STEP 3: If your blood sugar is still low at the 15 minute recheck --> then, go back to STEP 1 and treat AGAIN with another 15 grams of carbohydrates. ? ? ? ? ? ? ? ? ?

## 2021-06-19 ENCOUNTER — Other Ambulatory Visit (HOSPITAL_BASED_OUTPATIENT_CLINIC_OR_DEPARTMENT_OTHER): Payer: Self-pay

## 2021-06-19 ENCOUNTER — Encounter: Payer: Self-pay | Admitting: Internal Medicine

## 2021-06-20 ENCOUNTER — Other Ambulatory Visit (HOSPITAL_BASED_OUTPATIENT_CLINIC_OR_DEPARTMENT_OTHER): Payer: Self-pay

## 2021-06-20 MED ORDER — CHLORHEXIDINE GLUCONATE 0.12 % MT SOLN
OROMUCOSAL | 0 refills | Status: DC
  Filled 2021-07-12: qty 473, 30d supply, fill #0

## 2021-06-27 ENCOUNTER — Other Ambulatory Visit (INDEPENDENT_AMBULATORY_CARE_PROVIDER_SITE_OTHER): Payer: Federal, State, Local not specified - PPO

## 2021-06-27 DIAGNOSIS — E291 Testicular hypofunction: Secondary | ICD-10-CM | POA: Diagnosis not present

## 2021-06-27 LAB — LUTEINIZING HORMONE: LH: 4.12 m[IU]/mL (ref 1.50–9.30)

## 2021-06-27 LAB — PSA: PSA: 0.24 ng/mL (ref 0.10–4.00)

## 2021-07-01 LAB — TESTOSTERONE, TOTAL, LC/MS/MS: Testosterone, Total, LC-MS-MS: 197 ng/dL — ABNORMAL LOW (ref 250–1100)

## 2021-07-09 ENCOUNTER — Telehealth: Payer: Self-pay

## 2021-07-09 ENCOUNTER — Telehealth: Payer: Self-pay | Admitting: Internal Medicine

## 2021-07-09 NOTE — Telephone Encounter (Signed)
Patient would like to do the  testosterone injection and not the gel.

## 2021-07-09 NOTE — Telephone Encounter (Signed)
Left a message on 07/09/2021 at 12:30 PM   His testosterone level continues to be low and I have recommended starting topical testosterone.  Historically he has been on intramuscular injections  A portal message was sent as well  I waiting on response from the patient   Abby Nena Jordan, MD  [facility]  [facility] [address] [place], [place] [address] Phone: [phone] FAX: [phone]

## 2021-07-10 ENCOUNTER — Other Ambulatory Visit (HOSPITAL_BASED_OUTPATIENT_CLINIC_OR_DEPARTMENT_OTHER): Payer: Self-pay

## 2021-07-10 MED ORDER — TESTOSTERONE ENANTHATE 200 MG/ML IM SOLN
50.0000 mg | INTRAMUSCULAR | 0 refills | Status: DC
  Filled 2021-07-10: qty 5, 28d supply, fill #0

## 2021-07-10 MED ORDER — "BD LUER-LOK SYRINGE 22G X 1"" 3 ML MISC"
1.0000 | 3 refills | Status: DC
  Filled 2021-07-10: qty 6, 84d supply, fill #0
  Filled 2021-09-04 – 2021-10-09 (×2): qty 6, 84d supply, fill #1

## 2021-07-10 MED ORDER — TESTOSTERONE ENANTHATE 200 MG/ML IM SOLN
50.0000 mg | INTRAMUSCULAR | 2 refills | Status: DC
  Filled 2021-07-10: qty 5, 28d supply, fill #0

## 2021-07-10 NOTE — Telephone Encounter (Signed)
Testosterone needs PA

## 2021-07-10 NOTE — Telephone Encounter (Signed)
Vm and my chart message sent  ?

## 2021-07-10 NOTE — Telephone Encounter (Signed)
Testosterone Enanthate 200 MG/ML SOLN [[id number]  pharmacy states that this comes in the 5ML and not the 1ML. Will need a new script sent. Also want to make sure this is correct medication and dosage as patient was on different medication and dose.

## 2021-07-11 ENCOUNTER — Other Ambulatory Visit (HOSPITAL_BASED_OUTPATIENT_CLINIC_OR_DEPARTMENT_OTHER): Payer: Self-pay

## 2021-07-11 ENCOUNTER — Telehealth: Payer: Self-pay

## 2021-07-11 ENCOUNTER — Other Ambulatory Visit (HOSPITAL_COMMUNITY): Payer: Self-pay

## 2021-07-11 NOTE — Telephone Encounter (Signed)
Patient Advocate Encounter   Received notification that prior authorization for Testosterone Enanthate '200MG'$ /ML solution is required.   PA submitted on 07/11/2021 Key [id number] Status is pending

## 2021-07-11 NOTE — Telephone Encounter (Signed)
Patient Advocate Encounter   Received notification that prior authorization for Synjardy 12.5-'1000MG'$  tablets is required.   PA submitted on 07/11/2021 Key [id number] Status is pending

## 2021-07-12 ENCOUNTER — Other Ambulatory Visit (HOSPITAL_BASED_OUTPATIENT_CLINIC_OR_DEPARTMENT_OTHER): Payer: Self-pay

## 2021-07-12 ENCOUNTER — Other Ambulatory Visit: Payer: Self-pay | Admitting: Family Medicine

## 2021-07-12 ENCOUNTER — Encounter: Payer: Self-pay | Admitting: Internal Medicine

## 2021-07-12 DIAGNOSIS — I10 Essential (primary) hypertension: Secondary | ICD-10-CM

## 2021-07-12 DIAGNOSIS — M9903 Segmental and somatic dysfunction of lumbar region: Secondary | ICD-10-CM | POA: Diagnosis not present

## 2021-07-12 DIAGNOSIS — M5417 Radiculopathy, lumbosacral region: Secondary | ICD-10-CM | POA: Diagnosis not present

## 2021-07-12 DIAGNOSIS — M9901 Segmental and somatic dysfunction of cervical region: Secondary | ICD-10-CM | POA: Diagnosis not present

## 2021-07-12 DIAGNOSIS — M5413 Radiculopathy, cervicothoracic region: Secondary | ICD-10-CM | POA: Diagnosis not present

## 2021-07-12 DIAGNOSIS — F419 Anxiety disorder, unspecified: Secondary | ICD-10-CM

## 2021-07-12 MED ORDER — BUPROPION HCL ER (XL) 300 MG PO TB24
ORAL_TABLET | ORAL | 0 refills | Status: DC
  Filled 2021-07-12 – 2021-08-15 (×2): qty 90, 90d supply, fill #0

## 2021-07-12 MED ORDER — LISINOPRIL-HYDROCHLOROTHIAZIDE 10-12.5 MG PO TABS
1.0000 | ORAL_TABLET | Freq: Every day | ORAL | 0 refills | Status: DC
  Filled 2021-07-12 – 2021-08-15 (×2): qty 90, 90d supply, fill #0

## 2021-07-13 ENCOUNTER — Other Ambulatory Visit (HOSPITAL_BASED_OUTPATIENT_CLINIC_OR_DEPARTMENT_OTHER): Payer: Self-pay

## 2021-07-13 ENCOUNTER — Other Ambulatory Visit (HOSPITAL_COMMUNITY): Payer: Self-pay

## 2021-07-13 NOTE — Telephone Encounter (Signed)
Patient Advocate Encounter  Prior Authorization for Synjardy 12.5-'1000MG'$  tablets  has been approved.    PA# [id number] Effective dates: 06/11/2021 through 07/11/2022

## 2021-07-13 NOTE — Telephone Encounter (Signed)
Patient Advocate Encounter  Prior Authorization for Testosterone Enanthate '200MG'$ /ML solution has been approved.    PA# [id number] Effective dates: 06/11/2021 through 07/11/2022

## 2021-07-16 ENCOUNTER — Other Ambulatory Visit (HOSPITAL_BASED_OUTPATIENT_CLINIC_OR_DEPARTMENT_OTHER): Payer: Self-pay

## 2021-07-16 DIAGNOSIS — M5413 Radiculopathy, cervicothoracic region: Secondary | ICD-10-CM | POA: Diagnosis not present

## 2021-07-16 DIAGNOSIS — M5417 Radiculopathy, lumbosacral region: Secondary | ICD-10-CM | POA: Diagnosis not present

## 2021-07-16 DIAGNOSIS — M9903 Segmental and somatic dysfunction of lumbar region: Secondary | ICD-10-CM | POA: Diagnosis not present

## 2021-07-16 DIAGNOSIS — M9901 Segmental and somatic dysfunction of cervical region: Secondary | ICD-10-CM | POA: Diagnosis not present

## 2021-07-18 ENCOUNTER — Other Ambulatory Visit (HOSPITAL_BASED_OUTPATIENT_CLINIC_OR_DEPARTMENT_OTHER): Payer: Self-pay

## 2021-08-03 ENCOUNTER — Other Ambulatory Visit: Payer: Self-pay | Admitting: Family Medicine

## 2021-08-03 ENCOUNTER — Other Ambulatory Visit (HOSPITAL_BASED_OUTPATIENT_CLINIC_OR_DEPARTMENT_OTHER): Payer: Self-pay

## 2021-08-03 ENCOUNTER — Other Ambulatory Visit: Payer: Self-pay | Admitting: Internal Medicine

## 2021-08-03 DIAGNOSIS — E785 Hyperlipidemia, unspecified: Secondary | ICD-10-CM

## 2021-08-03 MED ORDER — TESTOSTERONE ENANTHATE 200 MG/ML IM SOLN
50.0000 mg | INTRAMUSCULAR | 5 refills | Status: DC
  Filled 2021-08-15: qty 5, 28d supply, fill #0
  Filled 2021-09-04 – 2021-09-12 (×2): qty 5, 28d supply, fill #1
  Filled 2021-10-09: qty 5, 28d supply, fill #2

## 2021-08-03 MED ORDER — FENOFIBRATE 145 MG PO TABS
ORAL_TABLET | Freq: Every day | ORAL | 1 refills | Status: DC
  Filled 2021-08-15: qty 90, 90d supply, fill #0
  Filled 2021-12-07: qty 90, 90d supply, fill #1

## 2021-08-09 ENCOUNTER — Other Ambulatory Visit: Payer: Self-pay | Admitting: Medical

## 2021-08-09 ENCOUNTER — Other Ambulatory Visit (HOSPITAL_BASED_OUTPATIENT_CLINIC_OR_DEPARTMENT_OTHER): Payer: Self-pay

## 2021-08-10 ENCOUNTER — Other Ambulatory Visit (HOSPITAL_BASED_OUTPATIENT_CLINIC_OR_DEPARTMENT_OTHER): Payer: Self-pay

## 2021-08-10 MED ORDER — FLUTICASONE PROPIONATE 50 MCG/ACT NA SUSP
2.0000 | Freq: Every day | NASAL | 1 refills | Status: DC
  Filled 2021-08-10: qty 16, 30d supply, fill #0
  Filled 2021-10-09: qty 16, 30d supply, fill #1

## 2021-08-13 ENCOUNTER — Other Ambulatory Visit (HOSPITAL_BASED_OUTPATIENT_CLINIC_OR_DEPARTMENT_OTHER): Payer: Self-pay

## 2021-08-15 ENCOUNTER — Other Ambulatory Visit (HOSPITAL_BASED_OUTPATIENT_CLINIC_OR_DEPARTMENT_OTHER): Payer: Self-pay

## 2021-08-16 ENCOUNTER — Other Ambulatory Visit (HOSPITAL_BASED_OUTPATIENT_CLINIC_OR_DEPARTMENT_OTHER): Payer: Self-pay

## 2021-08-16 DIAGNOSIS — M5417 Radiculopathy, lumbosacral region: Secondary | ICD-10-CM | POA: Diagnosis not present

## 2021-08-16 DIAGNOSIS — M5413 Radiculopathy, cervicothoracic region: Secondary | ICD-10-CM | POA: Diagnosis not present

## 2021-08-16 DIAGNOSIS — M9903 Segmental and somatic dysfunction of lumbar region: Secondary | ICD-10-CM | POA: Diagnosis not present

## 2021-08-16 DIAGNOSIS — M9901 Segmental and somatic dysfunction of cervical region: Secondary | ICD-10-CM | POA: Diagnosis not present

## 2021-08-20 ENCOUNTER — Other Ambulatory Visit (HOSPITAL_BASED_OUTPATIENT_CLINIC_OR_DEPARTMENT_OTHER): Payer: Self-pay

## 2021-08-23 ENCOUNTER — Other Ambulatory Visit (HOSPITAL_BASED_OUTPATIENT_CLINIC_OR_DEPARTMENT_OTHER): Payer: Self-pay

## 2021-08-23 ENCOUNTER — Ambulatory Visit (INDEPENDENT_AMBULATORY_CARE_PROVIDER_SITE_OTHER): Payer: Federal, State, Local not specified - PPO

## 2021-08-23 ENCOUNTER — Ambulatory Visit: Payer: Federal, State, Local not specified - PPO | Admitting: Podiatry

## 2021-08-23 DIAGNOSIS — M778 Other enthesopathies, not elsewhere classified: Secondary | ICD-10-CM

## 2021-08-23 DIAGNOSIS — M7672 Peroneal tendinitis, left leg: Secondary | ICD-10-CM | POA: Diagnosis not present

## 2021-08-23 DIAGNOSIS — T148XXA Other injury of unspecified body region, initial encounter: Secondary | ICD-10-CM

## 2021-08-23 NOTE — Progress Notes (Signed)
Subjective:   Patient ID: William [name], male   DOB: 61 y.o.   MRN: [id number]   HPI Patient presents stating he is getting a lot of pain in the outside of his left foot and he feels like the work environment that he is in has contributed to the pain and problems he has had   ROS      Objective:  Physical Exam  Neurovascular status intact with continuation of discomfort in the lateral side of the left foot around the peroneal tendon and history of probable tear of the plantar fascial tendon left number of months ago     Assessment:  Possibility for peroneal tear left of a split process along with fascial inflammation left that has improved with tear of the fascial band     Plan:  H&P reviewed condition and I do think long-term MRI would be best to try to rule out interstitial tear left peroneal tendon and also what might be going on plantarly with the previous pop that occurred in the arch.  Patient is having trouble with work and may not be able to continue work at the current job due to the foot structural issues that he has  X-rays do not indicate loss of the arch left it appears to be stable and appears to be soft tissue

## 2021-08-26 ENCOUNTER — Encounter: Payer: Self-pay | Admitting: Family Medicine

## 2021-08-27 ENCOUNTER — Telehealth: Payer: Self-pay

## 2021-08-27 ENCOUNTER — Telehealth: Payer: Self-pay | Admitting: *Deleted

## 2021-08-27 NOTE — Telephone Encounter (Signed)
Patient has requested another location for his MRI, [facility] [place] resolved  Called and scheduled with [facility],appointment 09/01/21'@10'$ :00,patient notified.

## 2021-08-27 NOTE — Telephone Encounter (Signed)
No additional notes are need and everything has been addressed for the patient.

## 2021-08-28 ENCOUNTER — Other Ambulatory Visit (HOSPITAL_BASED_OUTPATIENT_CLINIC_OR_DEPARTMENT_OTHER): Payer: Self-pay

## 2021-08-28 MED ORDER — PREVIDENT 5000 BOOSTER PLUS 1.1 % DT PSTE
PASTE | DENTAL | 3 refills | Status: DC
  Filled 2021-08-28: qty 100, 30d supply, fill #0
  Filled 2021-10-09: qty 100, 30d supply, fill #1
  Filled 2021-12-07: qty 100, 30d supply, fill #2

## 2021-08-28 MED ORDER — CHLORHEXIDINE GLUCONATE 0.12 % MT SOLN
OROMUCOSAL | 3 refills | Status: DC
  Filled 2021-08-28: qty 473, 16d supply, fill #0
  Filled 2021-09-04: qty 473, 16d supply, fill #1
  Filled 2021-09-30: qty 473, 16d supply, fill #2
  Filled 2021-10-29: qty 473, 16d supply, fill #3

## 2021-08-29 ENCOUNTER — Other Ambulatory Visit (HOSPITAL_BASED_OUTPATIENT_CLINIC_OR_DEPARTMENT_OTHER): Payer: Self-pay

## 2021-08-31 ENCOUNTER — Ambulatory Visit: Payer: Federal, State, Local not specified - PPO | Admitting: Family Medicine

## 2021-08-31 ENCOUNTER — Encounter: Payer: Self-pay | Admitting: Family Medicine

## 2021-08-31 VITALS — BP 112/70 | HR 102 | Temp 97.7°F | Ht 72.0 in | Wt 214.0 lb

## 2021-08-31 DIAGNOSIS — F419 Anxiety disorder, unspecified: Secondary | ICD-10-CM | POA: Diagnosis not present

## 2021-08-31 DIAGNOSIS — M79672 Pain in left foot: Secondary | ICD-10-CM | POA: Diagnosis not present

## 2021-08-31 DIAGNOSIS — F32A Depression, unspecified: Secondary | ICD-10-CM

## 2021-08-31 DIAGNOSIS — F4310 Post-traumatic stress disorder, unspecified: Secondary | ICD-10-CM | POA: Diagnosis not present

## 2021-08-31 NOTE — Progress Notes (Signed)
Chief Complaint  Patient presents with   Stress   Anxiety    Foot pain Job related    Subjective: Patient is a 61 y.o. male here for stress.  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 He is well adept at handing his depression, but anxiety levels have increased. He now has freq diarrhea associated with this. He is taking Seroquel and Xanax to sleep. Taking Zoloft 100 mg bid.  Reports compliance with no adverse effects.  Not following with a counselor or psych at this time.   Patient has a history of left foot pain for which she is following with podiatry for.  X-ray was largely unremarkable but he has an MRI scheduled for tomorrow.  He believes something needs to be excised and things have gotten worse since 2020, exacerbated by work per report.  There is no specific injury otherwise.  While he does feel more prominent tissue, there is no significant swelling, redness, or bruising.  Walking/standing for periods of time can be difficult because of this.  Past Medical History:  Diagnosis Date   Adjustment disorder with mixed anxiety and depressed mood    Arthritis    Asthma    history   Diabetes mellitus    type 2   Elevated blood pressure    history of high blood pressure readings   History of chicken pox    Hypercholesteremia    Hypertension    Internal hemorrhoids    Tubular adenoma of colon     Objective: BP 112/70   Pulse (!) 102   Temp 97.7 F (36.5 C) (Oral)   Ht 6' (1.829 m)   Wt 214 lb (97.1 kg)   SpO2 94%   BMI 29.02 kg/m  General: Awake, appears stated age MSK: Mild TTP over the base of the fifth/fourth metatarsal with prominence of the joint without significant ecchymosis, erythema, warmth, or edema Neuro: Gait appears to be normal, no cerebellar signs Lungs: No accessory muscle use Psych: Age appropriate judgment and insight, normal affect and mood  Assessment and Plan: PTSD (post-traumatic stress disorder) - Plan:  Ambulatory referral to Psychiatry  Anxiety and depression - Plan: Ambulatory referral to Psychiatry  Left foot pain  1/2.  I do think he needs to see psychiatry if he is going to involve Workmen's Comp. and try to get disability from this.  For now, we will continue Xanax nightly, Zoloft 100 mg twice daily, and Seroquel 25 mg nightly.  He does not wish to make a medication change at this time.   3.  I reiterated that disability for foot pain would be through the podiatry team and not from [place].  Appreciate podiatry input, we will await the results of his MRI. The patient voiced understanding and agreement to the plan.  I spent 50 minutes with the patient discussing the above plans in addition to reviewing his chart on the same day of the visit.  [place], DO 08/31/21  4:31 PM

## 2021-08-31 NOTE — Patient Instructions (Signed)
Stay active. Let me know what your lawyer says you need from me.   If you do not hear anything about your referral in the next 1-2 weeks, call our office and ask for an update.  Let us know if you need anything.

## 2021-09-01 ENCOUNTER — Ambulatory Visit (HOSPITAL_BASED_OUTPATIENT_CLINIC_OR_DEPARTMENT_OTHER)
Admission: RE | Admit: 2021-09-01 | Discharge: 2021-09-01 | Disposition: A | Discharge status: Home / Self Care | Payer: Federal, State, Local not specified - PPO | Source: Ambulatory Visit | Attending: Podiatry | Admitting: Podiatry

## 2021-09-01 DIAGNOSIS — T148XXA Other injury of unspecified body region, initial encounter: Secondary | ICD-10-CM | POA: Insufficient documentation

## 2021-09-01 DIAGNOSIS — M7989 Other specified soft tissue disorders: Secondary | ICD-10-CM | POA: Diagnosis not present

## 2021-09-01 DIAGNOSIS — S9032XA Contusion of left foot, initial encounter: Secondary | ICD-10-CM | POA: Diagnosis not present

## 2021-09-03 ENCOUNTER — Other Ambulatory Visit (HOSPITAL_BASED_OUTPATIENT_CLINIC_OR_DEPARTMENT_OTHER): Payer: Self-pay

## 2021-09-03 MED ORDER — AMOXICILLIN 500 MG PO CAPS
ORAL_CAPSULE | ORAL | 0 refills | Status: DC
  Filled 2021-09-03: qty 21, 7d supply, fill #0

## 2021-09-03 MED ORDER — ACETAMINOPHEN-CODEINE 300-30 MG PO TABS
1.0000 | ORAL_TABLET | Freq: Four times a day (QID) | ORAL | 0 refills | Status: DC | PRN
Start: 2021-09-03 — End: 2021-10-04
  Filled 2021-09-03: qty 6, 1d supply, fill #0
  Filled 2021-09-03: qty 10, 3d supply, fill #0

## 2021-09-04 ENCOUNTER — Other Ambulatory Visit (HOSPITAL_BASED_OUTPATIENT_CLINIC_OR_DEPARTMENT_OTHER): Payer: Self-pay

## 2021-09-05 ENCOUNTER — Ambulatory Visit: Payer: Self-pay | Admitting: Podiatry

## 2021-09-05 ENCOUNTER — Encounter: Payer: Self-pay | Admitting: Podiatry

## 2021-09-05 ENCOUNTER — Ambulatory Visit: Payer: Federal, State, Local not specified - PPO | Admitting: Podiatry

## 2021-09-05 DIAGNOSIS — T148XXA Other injury of unspecified body region, initial encounter: Secondary | ICD-10-CM | POA: Diagnosis not present

## 2021-09-05 DIAGNOSIS — S92215A Nondisplaced fracture of cuboid bone of left foot, initial encounter for closed fracture: Secondary | ICD-10-CM

## 2021-09-05 NOTE — Progress Notes (Signed)
Subjective:   Patient ID: William [name], male   DOB: 61 y.o.   MRN: [id number]   HPI Patient presents with a lot of pain and swelling in the outside of the left foot and had read his MRI indicating possibility for cuboid fracture with tendinosis of the peroneal tendon insertion   ROS      Objective:  Physical Exam  Neurovascular status intact with quite a bit of discomfort noted around the cuboid bone and the peroneal insertion base of fifth metatarsal left foot with inflammation also plantar     Assessment:  Inflammatory condition with possibility for a intra-articular fracture of the cuboid bone left     Plan:  H&P reviewed condition.  Due to the fact that there is most likely a fracture present organ to go ahead and take him out of work for 4 weeks and I want him wearing an air fracture walker to immobilize the foot and utilizing ice and reduced activity.  He then can return for the most part remote but I do want him to try to be on the foot less until we have resolution of this.  It is possible he will need a bone stimulator or other treatments but hopefully he will respond to immobilization

## 2021-09-06 ENCOUNTER — Encounter: Payer: Self-pay | Admitting: Podiatry

## 2021-09-10 NOTE — Telephone Encounter (Signed)
This should go to angela upstairs for completion

## 2021-09-12 ENCOUNTER — Other Ambulatory Visit (HOSPITAL_BASED_OUTPATIENT_CLINIC_OR_DEPARTMENT_OTHER): Payer: Self-pay

## 2021-09-12 ENCOUNTER — Other Ambulatory Visit: Payer: Self-pay | Admitting: Family Medicine

## 2021-09-12 DIAGNOSIS — E785 Hyperlipidemia, unspecified: Secondary | ICD-10-CM

## 2021-09-12 MED ORDER — ROSUVASTATIN CALCIUM 40 MG PO TABS
ORAL_TABLET | Freq: Every day | ORAL | 1 refills | Status: DC
  Filled 2021-09-12: qty 90, 90d supply, fill #0
  Filled 2021-12-07: qty 90, 90d supply, fill #1

## 2021-09-13 ENCOUNTER — Other Ambulatory Visit (HOSPITAL_BASED_OUTPATIENT_CLINIC_OR_DEPARTMENT_OTHER): Payer: Self-pay

## 2021-09-14 ENCOUNTER — Other Ambulatory Visit (HOSPITAL_BASED_OUTPATIENT_CLINIC_OR_DEPARTMENT_OTHER): Payer: Self-pay

## 2021-09-17 ENCOUNTER — Other Ambulatory Visit (HOSPITAL_BASED_OUTPATIENT_CLINIC_OR_DEPARTMENT_OTHER): Payer: Self-pay

## 2021-09-19 ENCOUNTER — Ambulatory Visit: Payer: Self-pay

## 2021-09-19 ENCOUNTER — Ambulatory Visit: Payer: Federal, State, Local not specified - PPO | Admitting: Family Medicine

## 2021-09-19 ENCOUNTER — Encounter: Payer: Self-pay | Admitting: Family Medicine

## 2021-09-19 VITALS — BP 130/72 | Ht 72.0 in | Wt 214.0 lb

## 2021-09-19 DIAGNOSIS — M7918 Myalgia, other site: Secondary | ICD-10-CM | POA: Diagnosis not present

## 2021-09-19 MED ORDER — METHYLPREDNISOLONE ACETATE 40 MG/ML IJ SUSP
40.0000 mg | Freq: Once | INTRAMUSCULAR | Status: AC
  Administered 2021-09-19: 40 mg via INTRAMUSCULAR
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
## 2021-09-19 NOTE — Patient Instructions (Signed)
Good to see you Please use heat   Please send me a message in MyChart with any questions or updates.  Please see me back as needed.   --Dr. Raeford Razor

## 2021-09-20 ENCOUNTER — Encounter (HOSPITAL_COMMUNITY): Payer: Self-pay | Admitting: Psychiatry

## 2021-09-20 ENCOUNTER — Ambulatory Visit (HOSPITAL_BASED_OUTPATIENT_CLINIC_OR_DEPARTMENT_OTHER): Payer: Federal, State, Local not specified - PPO | Admitting: Psychiatry

## 2021-09-20 ENCOUNTER — Other Ambulatory Visit (HOSPITAL_BASED_OUTPATIENT_CLINIC_OR_DEPARTMENT_OTHER): Payer: Self-pay

## 2021-09-20 VITALS — BP 173/78 | HR 94 | Resp 18 | Ht 72.0 in | Wt 215.0 lb

## 2021-09-20 DIAGNOSIS — F32A Depression, unspecified: Secondary | ICD-10-CM | POA: Diagnosis not present

## 2021-09-20 DIAGNOSIS — F4312 Post-traumatic stress disorder, chronic: Secondary | ICD-10-CM | POA: Diagnosis not present

## 2021-09-20 DIAGNOSIS — F419 Anxiety disorder, unspecified: Secondary | ICD-10-CM

## 2021-09-20 MED ORDER — TRAZODONE HCL 50 MG PO TABS
50.0000 mg | ORAL_TABLET | Freq: Every day | ORAL | 0 refills | Status: DC
  Filled 2021-09-20: qty 40, 20d supply, fill #0
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
## 2021-09-24 ENCOUNTER — Other Ambulatory Visit (HOSPITAL_BASED_OUTPATIENT_CLINIC_OR_DEPARTMENT_OTHER): Payer: Self-pay

## 2021-09-27 ENCOUNTER — Other Ambulatory Visit (HOSPITAL_BASED_OUTPATIENT_CLINIC_OR_DEPARTMENT_OTHER): Payer: Self-pay

## 2021-09-27 MED ORDER — CHLORHEXIDINE GLUCONATE 0.12 % MT SOLN
Freq: Two times a day (BID) | OROMUCOSAL | 3 refills | Status: DC
  Filled 2021-09-27: qty 473, 16d supply, fill #0
  Filled 2021-10-09 – 2021-10-12 (×2): qty 473, 16d supply, fill #1
  Filled 2021-10-29 – 2022-04-04 (×2): qty 473, 16d supply, fill #2

## 2021-09-27 NOTE — Telephone Encounter (Signed)
Please send to angela upstairs to fill out

## 2021-10-01 ENCOUNTER — Other Ambulatory Visit (HOSPITAL_BASED_OUTPATIENT_CLINIC_OR_DEPARTMENT_OTHER): Payer: Self-pay

## 2021-10-04 ENCOUNTER — Other Ambulatory Visit (HOSPITAL_BASED_OUTPATIENT_CLINIC_OR_DEPARTMENT_OTHER): Payer: Self-pay

## 2021-10-04 MED ORDER — ACETAMINOPHEN-CODEINE 300-30 MG PO TABS
1.0000 | ORAL_TABLET | Freq: Four times a day (QID) | ORAL | 0 refills | Status: DC | PRN
  Filled 2021-10-04: qty 16, 4d supply, fill #0

## 2021-10-09 ENCOUNTER — Other Ambulatory Visit: Payer: Self-pay | Admitting: Family Medicine

## 2021-10-09 ENCOUNTER — Other Ambulatory Visit (HOSPITAL_COMMUNITY): Payer: Self-pay | Admitting: Psychiatry

## 2021-10-09 ENCOUNTER — Other Ambulatory Visit (HOSPITAL_BASED_OUTPATIENT_CLINIC_OR_DEPARTMENT_OTHER): Payer: Self-pay

## 2021-10-09 DIAGNOSIS — F32A Depression, unspecified: Secondary | ICD-10-CM

## 2021-10-09 MED ORDER — SERTRALINE HCL 100 MG PO TABS
100.0000 mg | ORAL_TABLET | Freq: Two times a day (BID) | ORAL | 1 refills | Status: DC
  Filled 2021-10-09 – 2022-01-09 (×5): qty 180, 90d supply, fill #0

## 2021-10-09 MED ORDER — TADALAFIL 20 MG PO TABS
20.0000 mg | ORAL_TABLET | Freq: Every day | ORAL | 5 refills | Status: DC | PRN
  Filled 2021-10-09 – 2021-10-12 (×2): qty 30, 30d supply, fill #0
  Filled 2021-10-12: qty 7, 7d supply, fill #0
  Filled 2021-10-17: qty 23, 23d supply, fill #0
  Filled 2021-12-07: qty 30, 30d supply, fill #1

## 2021-10-10 ENCOUNTER — Other Ambulatory Visit (HOSPITAL_BASED_OUTPATIENT_CLINIC_OR_DEPARTMENT_OTHER): Payer: Self-pay

## 2021-10-11 ENCOUNTER — Encounter: Payer: Self-pay | Admitting: Podiatry

## 2021-10-11 ENCOUNTER — Ambulatory Visit: Payer: Federal, State, Local not specified - PPO | Admitting: Podiatry

## 2021-10-11 ENCOUNTER — Other Ambulatory Visit (HOSPITAL_BASED_OUTPATIENT_CLINIC_OR_DEPARTMENT_OTHER): Payer: Self-pay

## 2021-10-11 DIAGNOSIS — M722 Plantar fascial fibromatosis: Secondary | ICD-10-CM | POA: Diagnosis not present

## 2021-10-11 DIAGNOSIS — M7672 Peroneal tendinitis, left leg: Secondary | ICD-10-CM | POA: Diagnosis not present

## 2021-10-11 MED ORDER — TRIAMCINOLONE ACETONIDE 10 MG/ML IJ SUSP
10.0000 mg | Freq: Once | INTRAMUSCULAR | Status: AC
  Administered 2021-10-11: 10 mg

## 2021-10-11 NOTE — Progress Notes (Signed)
Subjective:   Patient ID: William [name], male   DOB: 61 y.o.   MRN: [id number]   HPI Patient states with the boot on his foot is feeling quite a bit better but he is getting 1 area of discomfort in the outside of the left foot stating that its been inflamed in that area.  He thinks if he can get 1 more cortisone injection it may resolve but patient has been dealing with for a number of months chronic Planter fasciitis with compensatory inflammation of the peroneal tendon with possibility for an internal fracture of the cuboid based on MRI report   ROS      Objective:  Physical Exam  Neurovascular status found to be intact with patient having 1 area of discomfort in the lateral side of the left foot base of fifth metatarsal with inflammation around this area with patient having probable tendinosis of the area with a very slight chance of a split tear but doing better with boot therapy.  Patient's work schedule does require a lot of standing a lot of moving around and at this point he is doing better with immobilization and I am concerned about returning him to the same job that was contributory towards the original Planter fasciitis compensatory peroneal tendinitis possible cuboid f fracture     Assessment:  Overall doing well but does have inflammation of the peroneal tendon at its base fifth metatarsal with immobilization still present     Plan:  We reviewed condition Rena do 1 careful steroid injection of this area and he request this understands risk and the chances for rupture and ultimate repair of the peroneal tendon may be necessary.  He wants the injection I did careful sterile prep I did explain risk to him I then injected the insertion 2 mg dexamethasone 2 mg Kenalog 5 mg Xylocaine continue complete  immobilization for several more weeks and reappoint as symptoms indicate

## 2021-10-12 ENCOUNTER — Encounter: Payer: Self-pay | Admitting: Family Medicine

## 2021-10-12 ENCOUNTER — Other Ambulatory Visit (HOSPITAL_BASED_OUTPATIENT_CLINIC_OR_DEPARTMENT_OTHER): Payer: Self-pay

## 2021-10-12 ENCOUNTER — Ambulatory Visit: Payer: Federal, State, Local not specified - PPO | Admitting: Family Medicine

## 2021-10-12 VITALS — BP 118/64 | HR 101 | Temp 98.0°F | Ht 72.0 in | Wt 217.0 lb

## 2021-10-12 DIAGNOSIS — F32A Depression, unspecified: Secondary | ICD-10-CM

## 2021-10-12 DIAGNOSIS — F419 Anxiety disorder, unspecified: Secondary | ICD-10-CM

## 2021-10-12 DIAGNOSIS — M79672 Pain in left foot: Secondary | ICD-10-CM

## 2021-10-12 DIAGNOSIS — F4310 Post-traumatic stress disorder, unspecified: Secondary | ICD-10-CM

## 2021-10-12 NOTE — Progress Notes (Signed)
Chief Complaint  Patient presents with   Follow-up    Subjective: Patient is a 61 y.o. male here for f/u.  Patient is following up for several issues that are making it very difficult for him to work in his current position.  He recently saw the podiatrist for an ongoing left foot issue.  The amount of standing/walking required for his job has been making things worse over the years.  He is currently in a boot which is helpful.  Being off of his foot has helped as well.  Patient has a history of anxiety, depression, and PTSD.  He recently established with a psychiatrist.  He is working with a psychiatrist to wean down on alprazolam.  Trazodone was recently started.  He is compliant with Wellbutrin, Zoloft, and Seroquel.  No adverse effects.  The stress of his job exacerbates his mental health issues.  The frequent standing associated with his job has been exacerbating his left foot problems as well.  Past Medical History:  Diagnosis Date   Adjustment disorder with mixed anxiety and depressed mood    Arthritis    Asthma    history   Diabetes mellitus    type 2   Elevated blood pressure    history of high blood pressure readings   History of chicken pox    Hypercholesteremia    Hypertension    Internal hemorrhoids    Tubular adenoma of colon     Objective: BP 118/64   Pulse (!) 101   Temp 98 F (36.7 C) (Oral)   Ht 6' (1.829 m)   Wt 217 lb (98.4 kg)   SpO2 96%   BMI 29.43 kg/m  General: Awake, appears stated age Neuro: Gait is w a limp 2/2 walking boot on L Lungs: No accessory muscle use Psych: Age appropriate judgment and insight, normal affect and mood  Assessment and Plan: Anxiety and depression  PTSD (post-traumatic stress disorder)  Left foot pain  We had a lengthy discussion regarding his current situation and the effect his job is having on it.  In my medical opinion, I do not believe it is conducive for the above diagnoses for him to work at his current  position or any governmental position requiring prolonged standing or physical activity involving his foot.  He has been compliant with recommendations and is following the advice of his respective specialists. The patient voiced understanding and agreement to the plan.  I spent 45 minutes with the patient discussing the above in addition to reviewing his chart and paperwork on the same day of the visit.   [place], DO 10/12/21  9:18 AM

## 2021-10-12 NOTE — Patient Instructions (Signed)
My note will be completed today.   Continue following with Dr. Paulla Dolly and Dr. Adele Schilder.   Let us know if you need anything.

## 2021-10-13 ENCOUNTER — Encounter (INDEPENDENT_AMBULATORY_CARE_PROVIDER_SITE_OTHER): Payer: Federal, State, Local not specified - PPO | Admitting: Family Medicine

## 2021-10-13 DIAGNOSIS — F32A Depression, unspecified: Secondary | ICD-10-CM

## 2021-10-13 DIAGNOSIS — F4310 Post-traumatic stress disorder, unspecified: Secondary | ICD-10-CM

## 2021-10-13 DIAGNOSIS — F419 Anxiety disorder, unspecified: Secondary | ICD-10-CM | POA: Diagnosis not present

## 2021-10-13 DIAGNOSIS — M79672 Pain in left foot: Secondary | ICD-10-CM

## 2021-10-15 ENCOUNTER — Encounter: Payer: Self-pay | Admitting: Podiatry

## 2021-10-15 ENCOUNTER — Other Ambulatory Visit (HOSPITAL_BASED_OUTPATIENT_CLINIC_OR_DEPARTMENT_OTHER): Payer: Self-pay

## 2021-10-15 NOTE — Telephone Encounter (Addendum)
Please see the MyChart message reply(ies) for my assessment and plan.  The patient gave consent for this Medical Advice Message and is aware that it may result in a bill to their insurance company as well as the possibility that this may result in a co-payment or deductible. They are an established patient, but are not seeking medical advice exclusively about a problem treated during an in person or video visit in the last 7 days. I did not recommend an in person or video visit within 7 days of my reply.  The form was completed as part of a case made by the patient to his employer for why he qualifies for disability. The main reasons are related to mental health (GAD/depression/PTSD) and his chronic left foot pain. We are not currently altering medical management of these as he is currently set up with specialists the respective issues.   I spent a total of 25 minutes cumulative time within 7 days through [employer] and filling out paperwork sent via [place].  [place], DO

## 2021-10-16 ENCOUNTER — Other Ambulatory Visit (HOSPITAL_BASED_OUTPATIENT_CLINIC_OR_DEPARTMENT_OTHER): Payer: Self-pay

## 2021-10-16 ENCOUNTER — Telehealth: Payer: Self-pay | Admitting: Family Medicine

## 2021-10-16 ENCOUNTER — Other Ambulatory Visit (HOSPITAL_COMMUNITY): Payer: Self-pay | Admitting: Psychiatry

## 2021-10-16 DIAGNOSIS — H2513 Age-related nuclear cataract, bilateral: Secondary | ICD-10-CM | POA: Diagnosis not present

## 2021-10-16 DIAGNOSIS — H5213 Myopia, bilateral: Secondary | ICD-10-CM | POA: Diagnosis not present

## 2021-10-16 DIAGNOSIS — H40013 Open angle with borderline findings, low risk, bilateral: Secondary | ICD-10-CM | POA: Diagnosis not present

## 2021-10-16 DIAGNOSIS — H524 Presbyopia: Secondary | ICD-10-CM | POA: Diagnosis not present

## 2021-10-16 DIAGNOSIS — F32A Depression, unspecified: Secondary | ICD-10-CM

## 2021-10-16 DIAGNOSIS — E119 Type 2 diabetes mellitus without complications: Secondary | ICD-10-CM | POA: Diagnosis not present

## 2021-10-16 NOTE — Telephone Encounter (Signed)
Called the patient back and did clarify paperwork.

## 2021-10-16 NOTE — Telephone Encounter (Signed)
Pt called stating that he needed clarification on the questionnaire portion on his [employer] paperwork. He stated he is unable to read the answers to the questions are illegible and needs the answers so that he can transcribe them onto another form so that the DoL can read it. Pt also stated he will be uploading a signature page into MyChart for Dr. Nani Ravens to sign in regards to this as well.

## 2021-10-17 ENCOUNTER — Other Ambulatory Visit (HOSPITAL_BASED_OUTPATIENT_CLINIC_OR_DEPARTMENT_OTHER): Payer: Self-pay

## 2021-10-29 ENCOUNTER — Ambulatory Visit: Payer: Federal, State, Local not specified - PPO | Admitting: Internal Medicine

## 2021-10-29 ENCOUNTER — Encounter: Payer: Self-pay | Admitting: Internal Medicine

## 2021-10-29 ENCOUNTER — Other Ambulatory Visit (HOSPITAL_BASED_OUTPATIENT_CLINIC_OR_DEPARTMENT_OTHER): Payer: Self-pay

## 2021-10-29 ENCOUNTER — Other Ambulatory Visit: Payer: Self-pay | Admitting: Internal Medicine

## 2021-10-29 ENCOUNTER — Other Ambulatory Visit (HOSPITAL_COMMUNITY): Payer: Self-pay | Admitting: Psychiatry

## 2021-10-29 VITALS — BP 122/80 | HR 80 | Ht 72.0 in | Wt 215.0 lb

## 2021-10-29 DIAGNOSIS — F419 Anxiety disorder, unspecified: Secondary | ICD-10-CM

## 2021-10-29 DIAGNOSIS — E1165 Type 2 diabetes mellitus with hyperglycemia: Secondary | ICD-10-CM | POA: Diagnosis not present

## 2021-10-29 DIAGNOSIS — E291 Testicular hypofunction: Secondary | ICD-10-CM

## 2021-10-29 DIAGNOSIS — E119 Type 2 diabetes mellitus without complications: Secondary | ICD-10-CM

## 2021-10-29 DIAGNOSIS — Z794 Long term (current) use of insulin: Secondary | ICD-10-CM | POA: Diagnosis not present

## 2021-10-29 LAB — POCT GLYCOSYLATED HEMOGLOBIN (HGB A1C): Hemoglobin A1C: 7.7 % — AB (ref 4.0–5.6)

## 2021-10-29 NOTE — Progress Notes (Signed)
Name: William [name]  MRN/ DOB: [id number], 1960/09/05   Age/ Sex: 61 y.o., male    PCP: Shelda Pal, DO   Reason for Endocrinology Evaluation: Type 2 Diabetes Mellitus     Date of Initial Endocrinology Visit: 06/18/2021    PATIENT IDENTIFIER: William [name] is a 61 y.o. male with a past medical history of HTN, T2DM, hypogonadism. The patient presented for initial endocrinology clinic visit on 06/18/2021 for consultative assistance with his diabetes management.    HPI: William [name] was    Diagnosed with DM 2000 Prior Medications tried/Intolerance:  Hemoglobin A1c has ranged from 6.5% in 2020, peaking at 9.9% in 2022.     Has left plantar faciitis, follows with podiatry    TESTOSTERONE HISTORY: He has been noted with low testosterone since 2013, with a nadir of 123 NG/DL in 2015. He stopped taking testosterone ~ 2 months ago because it did not help with energy help.      SUBJECTIVE:   During the last visit (06/18/2021): A1c 7.7%   Today (10/29/21): William [name] is here for a follow up on diabetes and hypogonadism management. He    checks his blood sugars multiple  times daily through freestyle libre . The patient has  had hypoglycemic episodes since the last clinic visit. He is symptomatic with these episodes   He is getting ready to retire Has plantar fasciitis  Continues to complain about high stress at work   He forgets to take insulin 30% of the time  He forgets to take 2nd dose of synjardy   Last testosterone Sundays    HOME ENDOCRINE  REGIMEN: Synjardy 12.06/998 mg 2 tabs daily Basaglar 70 units QAM  Ozempic 2 mg weekly Testosterone enanthate 200 mg/ML 50 mg Q 14 days , has been taking 0.25 ml Q weekly     Statin: Yes ACE-I/ARB: yes     CONTINUOUS GLUCOSE MONITORING RECORD INTERPRETATION    Dates of Recording:9/12-9/25/2023  Sensor description:freestyle libre   Results statistics:   CGM use % of time 96  Average and SD 152/30.7  Time  in range 70   %  % Time Above 180 23  % Time above 250 3  % Time Below target 3       Glycemic patterns summary:BG's trending down during the day and optimal overnight   Hyperglycemic episodes  postprandial   Hypoglycemic episodes occurred during the day   Overnight periods: trends down      DIABETIC COMPLICATIONS: Microvascular complications:  Nonproliferative DR Denies: CKD, neuropathy  Last eye exam: Completed 02/2020  Macrovascular complications:   Denies: CAD, PVD, CVA   PAST HISTORY: Past Medical History:  Past Medical History:  Diagnosis Date   Adjustment disorder with mixed anxiety and depressed mood    Arthritis    Asthma    history   Diabetes mellitus    type 2   Elevated blood pressure    history of high blood pressure readings   History of chicken pox    Hypercholesteremia    Hypertension    Internal hemorrhoids    Tubular adenoma of colon    Past Surgical History:  Past Surgical History:  Procedure Laterality Date   ELBOW SURGERY     right elbow 2010, left elbow 08/06/10 William [name])   KNEE SURGERY     right knee new ACL- [phone]   KNEE SURGERY     left knee 2003   NASAL SEPTUM SURGERY  William [name]     right hip replacement    Social History:  reports that he has never smoked. He has never used smokeless tobacco. He reports current alcohol use of about 7.0 - 14.0 standard drinks of alcohol per week. He reports that he does not use drugs. Family History:  Family History  Problem Relation Age of Onset   Diabetes Mother    Thyroid disease Mother        Questionable   Heart disease Father        deceased   Other Neg Hx        hypogonadism   Colon cancer Neg Hx      HOME MEDICATIONS: Allergies as of 10/29/2021   No Known Allergies      Medication List        Accurate as of October 29, 2021  3:55 PM. If you have any questions, ask your nurse or doctor.          ACCU-CHEK COMPACT CARE KIT  Kit Check blood sugar twice daily. Dx:E11.9   acetaminophen-codeine 300-30 MG tablet Commonly known as: TYLENOL #3 TAKE 1 TABLET BY MOUTH EVERY 6 HOURS AS NEEDED.   ALPRAZolam 1 MG tablet Commonly known as: XANAX TAKE 1 TABLET BY MOUTH 3 TIMES DAILY AS NEEDED FOR ANXIETY   ammonium lactate 12 % lotion Commonly known as: AmLactin Apply 1 application topically as needed for dry skin.   amoxicillin 500 MG capsule Commonly known as: AMOXIL TAKE 1 TABLET 3 TIMES PER DAY UNTIL FINISHED   B-D 3CC LUER-LOK SYR 22GX1" 22G X 1" 3 ML Misc Generic drug: SYRINGE-NEEDLE (DISP) 3 ML Use to inject testosterone   Basaglar KwikPen 100 UNIT/ML Inject 70 Units into the skin daily.   buPROPion 300 MG 24 hr tablet Commonly known as: WELLBUTRIN XL TAKE 1 TABLET BY MOUTH ONCE DAILY **NEED DIABETIC FOLLOW UP**   celecoxib 100 MG capsule Commonly known as: CELEBREX Take 1 capsule (100 mg total) by mouth 2 (two) times daily.   chlorhexidine 0.12 % solution Commonly known as: Periogard RINSE MOUTH WITH 15ML (1 CAPFUL) FOR 30 SECONDS AM AND PM AFTER TOOTHBRUSHING. EXPECTORATE AFTER RINSING, DO NOT SWALLOW   chlorhexidine 0.12 % solution Commonly known as: Periogard RINSE MOUTH WITH 15ML (1 CAPFUL) FOR 30 SECONDS AM AND PM AFTER TOOTHBRUSHING. EXPECTORATE AFTER RINSING, DO NOT SWALLOW   diclofenac 75 MG EC tablet Commonly known as: VOLTAREN Take 1 tablet (75 mg total) by mouth 2 (two) times daily.   fenofibrate 145 MG tablet Commonly known as: TRICOR TAKE 1 TABLET (145 MG TOTAL) BY MOUTH DAILY.   Fluarix Quadrivalent 0.5 ML injection Generic drug: influenza vac split quadrivalent PF Inject into the muscle.   fluticasone 50 MCG/ACT nasal spray Commonly known as: FLONASE Place 2 sprays into both nostrils daily.   FreeStyle Libre 3 Sensor Misc Use as directed every 14 days   Insulin Pen Needle 29G X 12.7MM Misc Use as directed daily in the afternoon   lisinopril-hydrochlorothiazide  10-12.5 MG tablet Commonly known as: ZESTORETIC TAKE 1 TABLET BY MOUTH ONCE DAILY   Moderna COVID-19 Bival Booster 50 MCG/0.5ML injection Generic drug: COVID-19 mRNA bivalent vaccine (Moderna) Inject into the muscle.   Moderna COVID-19 Vaccine 100 MCG/0.5ML injection Generic drug: COVID-19 mRNA vaccine (Moderna) Inject into the muscle.   ondansetron 4 MG disintegrating tablet Commonly known as: ZOFRAN-ODT Take 1 tablet (4 mg total) by mouth every 8 (eight) hours as needed for nausea  or vomiting.   Ozempic (2 MG/DOSE) 8 MG/3ML Sopn Generic drug: Semaglutide (2 MG/DOSE) Inject 2 mg into the skin once a week.   PreviDent 5000 Sensitive 1.1-5 % Gel Generic drug: Sod Fluoride-Potassium Nitrate USE AS DIRECTED ON PACKAGE   QUEtiapine 25 MG tablet Commonly known as: SEROQUEL Take 1 tablet (25 mg total) by mouth at bedtime.   rosuvastatin 40 MG tablet Commonly known as: CRESTOR TAKE 1 TABLET (40 MG TOTAL) BY MOUTH DAILY.   sertraline 100 MG tablet Commonly known as: ZOLOFT Take 1 tablet (100 mg total) by mouth 2 (two) times daily.   Sodium Fluoride 5000 Plus 1.1 % Crea dental cream Generic drug: sodium fluoride apply a pea size amount to tooth brush and brush teeth for 2 minutes preferably at bedtime   Sodium Fluoride 5000 PPM 1.1 % Pste Generic drug: Sodium Fluoride Apply a pea-sized amount of the paste to a toothbrush and brush thoroughly for 2 minutes preferably at bedtime.   Sodium Fluoride 5000 PPM 1.1 % Pste Generic drug: Sodium Fluoride USE A PEA SIZE AMOUNT 2 TIMES PER DAY AS DIRECTED   Synjardy 12.06-998 MG Tabs Generic drug: Empagliflozin-metFORMIN HCl Take 1 tablet by mouth 2 (two) times daily.   tadalafil 20 MG tablet Commonly known as: CIALIS Take 1 tablet (20 mg total) by mouth daily as needed for erectile dysfunction.   testosterone enanthate 200 MG/ML injection Commonly known as: DELATESTRYL Inject 0.25 mLs (50 mg total) into the muscle every 14  (fourteen) days.   traZODone 50 MG tablet Commonly known as: DESYREL Take 1-2 tablets (50-100 mg total) by mouth at bedtime.         ALLERGIES: No Known Allergies   REVIEW OF SYSTEMS: A comprehensive ROS was conducted with the patient and is negative except as per HPI     OBJECTIVE:   VITAL SIGNS: BP 122/80 (BP Location: Left Arm, Patient Position: Sitting, Cuff Size: Small)   Pulse 80   Ht 6' (1.829 m)   Wt 215 lb (97.5 kg)   SpO2 99%   BMI 29.16 kg/m    PHYSICAL EXAM:  General: Pt appears well and is in NAD  Lungs: Clear with good BS bilat with no rales, rhonchi, or wheezes  Heart: RRR   Extremities:  Lower extremities - No pretibial edema.  Neuro: MS is good with appropriate affect, pt is alert and Ox3    DM foot exam: 06/18/2021  The skin of the feet is intact without sores or ulcerations. The pedal pulses are 2+ on right and 2+ on left. The sensation is intact to a screening 5.07, 10 gram monofilament bilaterally   DATA REVIEWED:  Lab Results  Component Value Date   HGBA1C 7.7 (A) 10/29/2021   HGBA1C 7.7 (A) 06/18/2021   HGBA1C 9.4 (H) 01/04/2021   Lab Results  Component Value Date   MICROALBUR <0.7 06/09/2019   LDLCALC 154 (H) 12/09/2016   CREATININE 0.87 01/04/2021   Lab Results  Component Value Date   MICRALBCREAT 1.4 06/09/2019    Lab Results  Component Value Date   CHOL 123 01/04/2021   HDL 24.00 (L) 01/04/2021   LDLCALC 154 (H) 12/09/2016   LDLDIRECT 58.0 01/04/2021   TRIG (H) 01/04/2021    500.0 Triglyceride is over 400; calculations on Lipids are invalid.   CHOLHDL 5 01/04/2021        ASSESSMENT / PLAN / RECOMMENDATIONS:   1) Type 2 Diabetes Mellitus, Sub-Optimally controlled, With retinopathic complications - Most recent  A1c of 7.7  %. Goal A1c <7.0%.     -A1c remains stable and above goal -Patient has been noted with hypoglycemia as well as postprandial hyperglycemia, patient is a vegetarian -Patient also admits to  imperfect adherence to medications, he tends to forget the second dose of Synjardy, I have advised him again to take the 2 tablets of Synjardy together every morning -He also forgets to take basal insulin 30% of the time -I will reduce basal insulin as below -We did gust that if he continues with postprandial hyperglycemia we will have to discuss add-on therapy versus prandial insulin  MEDICATIONS: Change Synjardy 12.06-998 mg TWO Tabs QAM  Continue Ozempic 2 mg weekly  Decrease Basaglar 65 units daily   EDUCATION / INSTRUCTIONS: BG monitoring instructions: Patient is instructed to check his blood sugars 3 times a day, before meals . Call [facility] if: BG persistently < 70  I reviewed the Rule of 15 for the treatment of hypoglycemia in detail with the patient. Literature supplied.   2) Diabetic complications:  Eye: Does  have known diabetic retinopathy.  Neuro/ Feet: Does not have known diabetic peripheral neuropathy. Renal: Patient does not have known baseline CKD. He is not on an ACEI/ARB at present  3) Hypogonadism:  -His energy level has been stable -I have prescribed 50 mg of testosterone every 14 days,, the has been taking this every week -We will recheck testosterone, he will have his labs done on Wednesday (last dose of testosterone was yesterday and is too soon to check) -He will have this at [facility]  Medication  Testosterone 200 mg/ML, 50 mg (0.25 mL) weekly  Follow-up in 4 months      Signed electronically by: Mack Guise, MD  [facility]  [address] [place], [place] [address] Phone: [phone] FAX: [phone]   CC: Shelda Pal, [address] [place] [address] Phone: [phone]  Fax: [phone]    Return to Endocrinology clinic as below: Future Appointments  Date Time Provider [place]  11/01/2021  2:40 PM  Arfeen, Arlyce Harman, MD [facility] None  05/01/2022 11:30 AM Loanne Emery, Melanie Crazier, MD [facility] None

## 2021-10-29 NOTE — Patient Instructions (Addendum)
Continue Synjardy 12.06-998 mg TWO tablets every morning  Continue Ozempic 2 mg weekly  Decease Basaglar 65 units once daily    Labs next Wednesday please    HOW TO TREAT LOW BLOOD SUGARS (Blood sugar LESS THAN 70 MG/DL) Please follow the RULE OF 15 for the treatment of hypoglycemia treatment (when your (blood sugars are less than 70 mg/dL)   STEP 1: Take 15 grams of carbohydrates when your blood sugar is low, which includes:  3-4 GLUCOSE TABS  OR 3-4 OZ OF JUICE OR REGULAR SODA OR ONE TUBE OF GLUCOSE GEL    STEP 2: RECHECK blood sugar in 15 MINUTES STEP 3: If your blood sugar is still low at the 15 minute recheck --> then, go back to STEP 1 and treat AGAIN with another 15 grams of carbohydrates.

## 2021-10-30 ENCOUNTER — Other Ambulatory Visit (HOSPITAL_BASED_OUTPATIENT_CLINIC_OR_DEPARTMENT_OTHER): Payer: Self-pay

## 2021-10-30 ENCOUNTER — Other Ambulatory Visit (INDEPENDENT_AMBULATORY_CARE_PROVIDER_SITE_OTHER): Payer: Federal, State, Local not specified - PPO

## 2021-10-30 ENCOUNTER — Encounter: Payer: Self-pay | Admitting: Internal Medicine

## 2021-10-30 DIAGNOSIS — E291 Testicular hypofunction: Secondary | ICD-10-CM | POA: Diagnosis not present

## 2021-10-30 DIAGNOSIS — R972 Elevated prostate specific antigen [PSA]: Secondary | ICD-10-CM

## 2021-10-30 MED ORDER — FLUARIX QUADRIVALENT 0.5 ML IM SUSY
0.5000 mL | PREFILLED_SYRINGE | Freq: Once | INTRAMUSCULAR | 0 refills | Status: AC
  Filled 2021-10-30: qty 0.5, 1d supply, fill #0

## 2021-10-31 ENCOUNTER — Other Ambulatory Visit (HOSPITAL_BASED_OUTPATIENT_CLINIC_OR_DEPARTMENT_OTHER): Payer: Self-pay

## 2021-10-31 LAB — CBC
HCT: 41.1 % (ref 39.0–52.0)
Hemoglobin: 14 g/dL (ref 13.0–17.0)
MCHC: 34.1 g/dL (ref 30.0–36.0)
MCV: 84.6 fl (ref 78.0–100.0)
Platelets: 165 10*3/uL (ref 150.0–400.0)
RBC: 4.86 Mil/uL (ref 4.22–5.81)
RDW: 14.2 % (ref 11.5–15.5)
WBC: 6.7 10*3/uL (ref 4.0–10.5)

## 2021-10-31 LAB — COMPREHENSIVE METABOLIC PANEL
ALT: 20 U/L (ref 0–53)
AST: 15 U/L (ref 0–37)
Albumin: 4.4 g/dL (ref 3.5–5.2)
Alkaline Phosphatase: 72 U/L (ref 39–117)
BUN: 17 mg/dL (ref 6–23)
CO2: 25 mEq/L (ref 19–32)
Calcium: 9.3 mg/dL (ref 8.4–10.5)
Chloride: 102 mEq/L (ref 96–112)
Creatinine, Ser: 1.11 mg/dL (ref 0.40–1.50)
GFR: 70.73 mL/min (ref >60.00)
Glucose, Bld: 124 mg/dL — ABNORMAL HIGH (ref 70–99)
Potassium: 4.1 mEq/L (ref 3.5–5.1)
Sodium: 138 mEq/L (ref 135–145)
Total Bilirubin: 0.3 mg/dL (ref 0.2–1.2)
Total Protein: 6.9 g/dL (ref 6.0–8.3)

## 2021-10-31 LAB — TESTOSTERONE: Testosterone: 617.83 ng/dL (ref 300.00–890.00)

## 2021-10-31 LAB — PSA: PSA: 3.21 ng/mL (ref 0.10–4.00)

## 2021-11-01 ENCOUNTER — Other Ambulatory Visit (HOSPITAL_BASED_OUTPATIENT_CLINIC_OR_DEPARTMENT_OTHER): Payer: Self-pay

## 2021-11-01 ENCOUNTER — Encounter (HOSPITAL_COMMUNITY): Payer: Self-pay | Admitting: Psychiatry

## 2021-11-01 ENCOUNTER — Ambulatory Visit (HOSPITAL_BASED_OUTPATIENT_CLINIC_OR_DEPARTMENT_OTHER): Payer: Federal, State, Local not specified - PPO | Admitting: Psychiatry

## 2021-11-01 VITALS — BP 135/78 | HR 90 | Resp 18 | Ht 72.0 in | Wt 215.0 lb

## 2021-11-01 DIAGNOSIS — F32A Depression, unspecified: Secondary | ICD-10-CM | POA: Diagnosis not present

## 2021-11-01 DIAGNOSIS — F419 Anxiety disorder, unspecified: Secondary | ICD-10-CM

## 2021-11-01 DIAGNOSIS — F4312 Post-traumatic stress disorder, chronic: Secondary | ICD-10-CM | POA: Diagnosis not present

## 2021-11-01 MED ORDER — TRAZODONE HCL 100 MG PO TABS
100.0000 mg | ORAL_TABLET | Freq: Every day | ORAL | 0 refills | Status: DC
  Filled 2021-11-01: qty 90, 90d supply, fill #0

## 2021-11-01 NOTE — Progress Notes (Addendum)
BH MD/PA/NP OP Progress Note  11/01/2021 3:08 PM William [name]  MRN:  [id number]    HPI: William [name] came for his follow-up appointment he is a 61 year old Caucasian married, employed man who is referred from his PCP for the management of depression and anxiety and PTSD symptoms.  Today he mentioned that trazodone helps and he is sleeping better.  He cut down his Xanax and only taking 2 times a day and very rarely he need to third 1 when he is under a lot of stress.  His chronic stress are from his job.  Patient works as a Lexicographer.  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  He is going 2 days a week in person work.  He reported frustration with his supervisor who he believes is not efficient.  He is being for retirement retirement in March 06, 2002.  He is seeing his podiatrist on a regular basis for his plantar fasciitis.  He has paperwork to submit to Department of [employer] to settle the case.  He reported he is getting along better with his wife and trying much harder to improve the relationship.  He does watch movies to keep himself calm and distracted from other things.  He denies any hallucination, paranoia, suicidal thoughts.  He denies any mania or psychosis.  He is taking Seroquel 25 mg at bedtime along with Wellbutrin XL 300 mg daily, Zoloft 100 mg 2 times a day.  His long-term plan is to come off from the medication once he gets tired because he knows his stress level will go down.  He still gets some time nightmares and flashback about his past and sometime he believes the quetiapine does cause nightmares.  However he feels it helps his anxiety and does not want to change at this time.  He denies any weight gain or weight loss.  Recently he saw his endocrinologist and his hemoglobin A1c is 7.7.  He is pleased that his testosterone level is also improved and he noticed it helps him and giving confidence.  Patient denies drinking or using any illegal substances.  Visit Diagnosis:     ICD-10-CM   1. Chronic post-traumatic stress disorder (PTSD)  F43.12     2. Anxiety and depression  F41.9 traZODone (DESYREL) 100 MG tablet   F32.A       Past Psychiatric History: History of depression and anxiety.  He was also given the diagnosis of ADHD by [facility].  He tried Wellbutrin but did not continued along.  He started taking medication in year 2000 by primary care physician.  Initially tried Paxil but due to side effects which he describes shocklike buzz he stopped.  Denies any history of suicidal attempt but endorsed history of suicidal thoughts, mood swing, irritability, anger and panic attacks.  Past Medical History:  Past Medical History:  Diagnosis Date   Adjustment disorder with mixed anxiety and depressed mood    Arthritis    Asthma    history   Diabetes mellitus    type 2   Elevated blood pressure    history of high blood pressure readings   History of chicken pox    Hypercholesteremia    Hypertension    Internal hemorrhoids    Tubular adenoma of colon     Past Surgical History:  Procedure Laterality Date   ELBOW SURGERY     right elbow 2010, left elbow 08/06/10 [name])   KNEE SURGERY     right knee new  ACL- [id number]   KNEE SURGERY     left knee 2003   NASAL SEPTUM SURGERY     1992   PARTIAL HIP ARTHROPLASTY     right hip replacement    Family Psychiatric History: Reviewed.  Family History:  Family History  Problem Relation Age of Onset   Diabetes Mother    Thyroid disease Mother        Questionable   Heart disease Father        deceased   Other Neg Hx        hypogonadism   Colon cancer Neg Hx     Social History:  Social History   Socioeconomic History   Marital status: Married    Spouse name: Not on file   Number of children: Not on file   Years of education: Not on file   Highest education level: Not on file  Occupational History   Not on file  Tobacco Use   Smoking status: Never   Smokeless tobacco: Never  Substance  and Sexual Activity   Alcohol use: Yes    Alcohol/week: 7.0 - 14.0 standard drinks of alcohol    Types: 7 - 14 Glasses of wine per week   Drug use: No   Sexual activity: Not on file  Other Topics Concern   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   Married -78 marriage (2nd marriage)   daughter 57,  2 sons (2nd marriage  11,6)   [place]   Never Smoked    Alcohol use-yes   Drug use-no    Regular exercise-no   Smoking Status:  never   Does Patient Exercise:  no   Caffeine use/day:  3-4 cups coffee daily   Drug Use:  no         Social Determinants of Health   Financial Resource Strain: Not on file  Food Insecurity: Not on file  Transportation Needs: Not on file  Physical Activity: Not on file  Stress: Not on file  Social Connections: Not on file    Allergies: No Known Allergies  Metabolic Disorder Labs: Lab Results  Component Value Date   HGBA1C 7.7 (A) 10/29/2021   MPG 246 07/04/2017   MPG 174 12/09/2016   Lab Results  Component Value Date   PROLACTIN 3.4 09/27/2014   Lab Results  Component Value Date   CHOL 123 01/04/2021   TRIG (H) 01/04/2021    500.0 Triglyceride is over 400; calculations on Lipids are invalid.   HDL 24.00 (L) 01/04/2021   CHOLHDL 5 01/04/2021   VLDL 46.2 (H) 07/18/2020   LDLCALC 154 (H) 12/09/2016   LDLCALC 73 06/28/2016   Lab Results  Component Value Date   TSH 2.89 12/14/2019   TSH 3.91 06/28/2016    Therapeutic Level Labs: No results found for: "LITHIUM" No results found for: "VALPROATE" No results found for: "CBMZ"  Current Medications: Current Outpatient Medications  Medication Sig Dispense Refill   acetaminophen-codeine (TYLENOL #3) 300-30 MG tablet TAKE 1 TABLET BY MOUTH EVERY 6 HOURS AS NEEDED. 16 tablet 0   ALPRAZolam (XANAX) 1 MG tablet TAKE 1 TABLET BY MOUTH 3 TIMES DAILY AS NEEDED FOR ANXIETY 90 tablet 5   ammonium lactate (AMLACTIN) 12 % lotion Apply 1 application topically as needed for  dry skin. 400 g 0   amoxicillin (AMOXIL) 500 MG capsule TAKE 1 TABLET 3 TIMES PER DAY UNTIL FINISHED (Patient not taking: Reported on 10/29/2021) 21 capsule 0  Blood Glucose Monitoring Suppl (ACCU-CHEK COMPACT CARE KIT) KIT Check blood sugar twice daily. Dx:E11.9 1 each 0   buPROPion (WELLBUTRIN XL) 300 MG 24 hr tablet TAKE 1 TABLET BY MOUTH ONCE DAILY **NEED DIABETIC FOLLOW UP** 90 tablet 0   celecoxib (CELEBREX) 100 MG capsule Take 1 capsule (100 mg total) by mouth 2 (two) times daily. 180 capsule 1   chlorhexidine (PERIOGARD) 0.12 % solution RINSE MOUTH WITH 15ML (1 CAPFUL) FOR 30 SECONDS AM AND PM AFTER TOOTHBRUSHING. EXPECTORATE AFTER RINSING, DO NOT SWALLOW 473 mL 3   chlorhexidine (PERIOGARD) 0.12 % solution RINSE MOUTH WITH 15ML (1 CAPFUL) FOR 30 SECONDS AM AND PM AFTER TOOTHBRUSHING. EXPECTORATE AFTER RINSING, DO NOT SWALLOW 473 mL 3   Continuous Blood Gluc Sensor (FREESTYLE LIBRE 3 SENSOR) MISC Use as directed every 14 days 6 each 3   COVID-19 mRNA bivalent vaccine, Moderna, (MODERNA COVID-19 BIVAL BOOSTER) 50 MCG/0.5ML injection Inject into the muscle. 0.5 mL 0   COVID-19 mRNA vaccine, Moderna, (MODERNA COVID-19 VACCINE) 100 MCG/0.5ML injection Inject into the muscle. 0.25 mL 0   diclofenac (VOLTAREN) 75 MG EC tablet Take 1 tablet (75 mg total) by mouth 2 (two) times daily. 50 tablet 2   Empagliflozin-metFORMIN HCl (SYNJARDY) 12.06-998 MG TABS Take 1 tablet by mouth 2 (two) times daily. 180 tablet 3   fenofibrate (TRICOR) 145 MG tablet TAKE 1 TABLET (145 MG TOTAL) BY MOUTH DAILY. 90 tablet 1   fluticasone (FLONASE) 50 MCG/ACT nasal spray Place 2 sprays into both nostrils daily. 16 g 1   influenza vac split quadrivalent PF (FLUARIX) 0.5 ML injection Inject into the muscle. 0.5 mL 0   Insulin Glargine (BASAGLAR KWIKPEN) 100 UNIT/ML Inject 70 Units into the skin daily. 75 mL 3   Insulin Pen Needle 29G X 12.7MM MISC Use as directed daily in the afternoon 100 each 3    lisinopril-hydrochlorothiazide (ZESTORETIC) 10-12.5 MG tablet TAKE 1 TABLET BY MOUTH ONCE DAILY 90 tablet 0   ondansetron (ZOFRAN-ODT) 4 MG disintegrating tablet Take 1 tablet (4 mg total) by mouth every 8 (eight) hours as needed for nausea or vomiting. 20 tablet 0   QUEtiapine (SEROQUEL) 25 MG tablet Take 1 tablet (25 mg total) by mouth at bedtime. 90 tablet 2   rosuvastatin (CRESTOR) 40 MG tablet TAKE 1 TABLET (40 MG TOTAL) BY MOUTH DAILY. 90 tablet 1   Semaglutide, 2 MG/DOSE, (OZEMPIC, 2 MG/DOSE,) 8 MG/3ML SOPN Inject 2 mg into the skin once a week. 9 mL 3   sertraline (ZOLOFT) 100 MG tablet Take 1 tablet (100 mg total) by mouth 2 (two) times daily. 180 tablet 1   Sod Fluoride-Potassium Nitrate (PREVIDENT 5000 SENSITIVE) 1.1-5 % GEL USE AS DIRECTED ON PACKAGE 100 mL 1   Sodium Fluoride (PREVIDENT 5000 BOOSTER PLUS) 1.1 % PSTE USE A PEA SIZE AMOUNT 2 TIMES PER DAY AS DIRECTED 100 mL 3   sodium fluoride (PREVIDENT 5000 PLUS) 1.1 % CREA dental cream apply a pea size amount to tooth brush and brush teeth for 2 minutes preferably at bedtime 51 g 0   Sodium Fluoride 1.1 % PSTE Apply a pea-sized amount of the paste to a toothbrush and brush thoroughly for 2 minutes preferably at bedtime. 100 mL 0   SYRINGE-NEEDLE, DISP, 3 ML (B-D 3CC LUER-LOK SYR 22GX1") 22G X 1" 3 ML MISC Use to inject testosterone 10 each 3   tadalafil (CIALIS) 20 MG tablet Take 1 tablet (20 mg total) by mouth daily as needed for erectile dysfunction.  30 tablet 5   testosterone enanthate (DELATESTRYL) 200 MG/ML injection Inject 0.25 mLs (50 mg total) into the muscle every 14 (fourteen) days. 5 mL 5   traZODone (DESYREL) 100 MG tablet Take 1 tablet (100 mg total) by mouth at bedtime. 90 tablet 0   No current facility-administered medications for this visit.     Musculoskeletal: Strength & Muscle Tone: within normal limits Gait & Station:  Some difficulty in walking because of plantar fasciitis pain Patient leans:  See  above  Psychiatric Specialty Exam: Review of Systems  Blood pressure 135/78, pulse 90, resp. rate 18, height 6' (1.829 m), weight 215 lb (97.5 kg), SpO2 95 %.There is no height or weight on file to calculate BMI.  General Appearance: Meticulous and Well Groomed  Eye Contact:  Good  Speech:  Clear and Coherent  Volume:  Normal  Mood:  Anxious  Affect:  Congruent  Thought Process:  Goal Directed  Orientation:  Full (Time, Place, and Person)  Thought Content: WDL   Suicidal Thoughts:  No  Homicidal Thoughts:  No  Memory:  Immediate;   Good Recent;   Good Remote;   Good  Judgement:  Intact  Insight:  Present  Psychomotor Activity:  Normal  Concentration:  Concentration: Good and Attention Span: Good  Recall:  Good  Fund of Knowledge: Good  Language: Good  Akathisia:  No  Handed:  Right  AIMS (if indicated): not done  Assets:  Communication Skills Desire for [place] Talents/Skills Transportation  ADL's:  Intact  Cognition: WNL  Sleep:   Better   Screenings: Camera operator Row Office Visit from 08/31/2021 in [place] at [place] Visit from 07/21/2020 in [place] at [place] Video Visit from 12/06/2019 in [place] Video Visit from 06/11/2019 in [place] Office Visit from 08/13/2018 in [place]  PHQ-2 Total Score 6 0 _0 PHQ-9 Total Score 14 -- _1 Assessment and Plan: Merlyn is 61 year old Caucasian married, employed man with history of depression, anxiety and PTSD.  We added trazodone 50 mg and he noticed improvement in his anxiety, sleep.  He is also open to try higher dose since we are reducing the Xanax.  Currently he is taking Xanax 1 mg 2 times a day which is reduced from 3 times a day.  I recommend he can take Xanax now  1 a day since we are increasing trazodone 100 mg at bedtime.  Except trazodone all his medication are given by his PCP.  For now he will continue Zoloft 1 mg twice a day, Wellbutrin XL 300 mg daily and quetiapine 25 mg daily.  I reviewed blood work results, his hemoglobin A1c 7.7 and his testosterone level is therapeutic.  Patient is working with his PCP and podiatrist to fill the form for early retirement.  I recommend to call us back if is any question, concern or if he feels worsening of the symptoms.  Patient like to have a follow-up in 6 weeks.  Total time spent 30 minutes.  More than 50% of the time spent in providing direct care to the patient and which also included psychoeducation about the medication.  Collaboration of Care: Collaboration of Care: Other provider involved in patient's care AEB notes are available in epic to review.  Patient/Guardian was advised Release  of Information must be obtained prior to any record release in order to collaborate their care with an outside provider. Patient/Guardian was advised if they have not already done so to contact the registration department to sign all necessary forms in order for [place] to release information regarding their care.   Consent: Patient/Guardian gives verbal consent for treatment and assignment of benefits for services provided during this visit. Patient/Guardian expressed understanding and agreed to proceed.    Kathlee Nations, MD 11/01/2021, 3:08 PM

## 2021-11-02 ENCOUNTER — Telehealth: Payer: Self-pay

## 2021-11-02 ENCOUNTER — Telehealth: Payer: Self-pay | Admitting: Internal Medicine

## 2021-11-02 ENCOUNTER — Other Ambulatory Visit (HOSPITAL_BASED_OUTPATIENT_CLINIC_OR_DEPARTMENT_OTHER): Payer: Self-pay

## 2021-11-02 MED ORDER — TESTOSTERONE ENANTHATE 200 MG/ML IM SOLN
50.0000 mg | INTRAMUSCULAR | 5 refills | Status: DC
  Filled 2021-11-02 – 2021-12-07 (×2): qty 5, 28d supply, fill #0
  Filled 2022-01-09: qty 5, 28d supply, fill #1
  Filled 2022-02-16: qty 5, 28d supply, fill #2
  Filled 2022-04-04: qty 5, 28d supply, fill #3

## 2021-11-02 NOTE — Addendum Note (Signed)
Addended by: Dorita Sciara on: 11/02/2021 11:03 AM   Modules accepted: Orders

## 2021-11-02 NOTE — Telephone Encounter (Signed)
Attempted to contact the patient on 11/02/2021 at 11 AM to discuss the elevated PSA from 0.24 to 3.21 ng/mL   Since the elevation is more than 1.4 increase, based on the protocol I will have to refer him to urology   A portal message will be sent to the patient, as I was unable to get hold of him   He will also be advised to reduce his testosterone 50 mg every 14 days rather than every week    Abby Nena Jordan, MD  [facility]  [facility] [address] [place], [place] [address] Phone: [phone] FAX: [phone]

## 2021-11-02 NOTE — Telephone Encounter (Signed)
Patient returned your call.

## 2021-11-04 ENCOUNTER — Encounter: Payer: Self-pay | Admitting: Internal Medicine

## 2021-11-04 ENCOUNTER — Encounter: Payer: Self-pay | Admitting: Family Medicine

## 2021-11-05 ENCOUNTER — Other Ambulatory Visit: Payer: Self-pay | Admitting: Family Medicine

## 2021-11-05 DIAGNOSIS — R972 Elevated prostate specific antigen [PSA]: Secondary | ICD-10-CM

## 2021-11-05 NOTE — Telephone Encounter (Signed)
Patient notified through my chart.

## 2021-11-07 ENCOUNTER — Other Ambulatory Visit (HOSPITAL_BASED_OUTPATIENT_CLINIC_OR_DEPARTMENT_OTHER): Payer: Self-pay

## 2021-11-07 MED ORDER — CHLORHEXIDINE GLUCONATE 0.12 % MT SOLN
OROMUCOSAL | 0 refills | Status: DC
  Filled 2021-11-07: qty 473, 16d supply, fill #0

## 2021-11-07 MED ORDER — PREVIDENT 5000 BOOSTER PLUS 1.1 % DT PSTE
PASTE | DENTAL | 3 refills | Status: DC
  Filled 2021-11-07: qty 100, 30d supply, fill #0
  Filled 2021-12-07 – 2022-01-02 (×2): qty 100, 30d supply, fill #1

## 2021-11-07 MED ORDER — ACETAMINOPHEN-CODEINE 300-30 MG PO TABS
1.0000 | ORAL_TABLET | Freq: Four times a day (QID) | ORAL | 0 refills | Status: DC | PRN
  Filled 2021-11-07: qty 16, 4d supply, fill #0

## 2021-11-13 ENCOUNTER — Encounter: Payer: Self-pay | Admitting: Urology

## 2021-11-14 ENCOUNTER — Encounter: Payer: Self-pay | Admitting: Podiatry

## 2021-11-14 ENCOUNTER — Ambulatory Visit: Payer: Federal, State, Local not specified - PPO | Admitting: Podiatry

## 2021-11-14 DIAGNOSIS — M7672 Peroneal tendinitis, left leg: Secondary | ICD-10-CM | POA: Diagnosis not present

## 2021-11-14 DIAGNOSIS — S92215A Nondisplaced fracture of cuboid bone of left foot, initial encounter for closed fracture: Secondary | ICD-10-CM

## 2021-11-14 DIAGNOSIS — M722 Plantar fascial fibromatosis: Secondary | ICD-10-CM

## 2021-11-14 NOTE — Progress Notes (Signed)
Subjective:   Patient ID: William [name], male   DOB: 61 y.o.   MRN: [id number]   HPI Patient states he still experiences and suffers from chronic foot pain left and pain in the outside of his left foot.  He attributes this to standing on his foot at work the last 7 years and causing increased stress on the arch heel and compensation to the outside neurovascular status   ROS      Objective:  Physical Exam  This intact with patient continuing to have discomfort in the plantar heel arch and lateral foot chronic in nature     Assessment:  Probability for chronic fascial symptomatology that has led to compensatory peroneal pain and intra-articular fracture of the cuboid     Plan:  Reviewed condition continue doing what he is doing with support ice reduced ambulation reduce standing and patient will be seen back as needed

## 2021-11-21 ENCOUNTER — Encounter: Payer: Self-pay | Admitting: Urology

## 2021-11-28 ENCOUNTER — Encounter: Payer: Self-pay | Admitting: Podiatry

## 2021-11-28 ENCOUNTER — Ambulatory Visit (INDEPENDENT_AMBULATORY_CARE_PROVIDER_SITE_OTHER): Payer: Federal, State, Local not specified - PPO

## 2021-11-28 ENCOUNTER — Ambulatory Visit (INDEPENDENT_AMBULATORY_CARE_PROVIDER_SITE_OTHER): Payer: Federal, State, Local not specified - PPO | Admitting: Podiatry

## 2021-11-28 DIAGNOSIS — M779 Enthesopathy, unspecified: Secondary | ICD-10-CM

## 2021-11-28 DIAGNOSIS — M7672 Peroneal tendinitis, left leg: Secondary | ICD-10-CM

## 2021-11-29 ENCOUNTER — Encounter (HOSPITAL_COMMUNITY): Payer: Self-pay | Admitting: Psychiatry

## 2021-11-29 ENCOUNTER — Ambulatory Visit (HOSPITAL_COMMUNITY): Payer: Federal, State, Local not specified - PPO | Admitting: Psychiatry

## 2021-11-29 ENCOUNTER — Other Ambulatory Visit (HOSPITAL_BASED_OUTPATIENT_CLINIC_OR_DEPARTMENT_OTHER): Payer: Self-pay

## 2021-11-29 DIAGNOSIS — F419 Anxiety disorder, unspecified: Secondary | ICD-10-CM | POA: Diagnosis not present

## 2021-11-29 DIAGNOSIS — F4312 Post-traumatic stress disorder, chronic: Secondary | ICD-10-CM

## 2021-11-29 DIAGNOSIS — F32A Depression, unspecified: Secondary | ICD-10-CM | POA: Diagnosis not present

## 2021-11-29 MED ORDER — TRAZODONE HCL 100 MG PO TABS
100.0000 mg | ORAL_TABLET | Freq: Every day | ORAL | 0 refills | Status: DC
  Filled 2021-11-29 – 2022-01-23 (×3): qty 90, 90d supply, fill #0

## 2021-11-29 NOTE — Progress Notes (Signed)
BH MD/PA/NP OP Progress Note  11/29/2021 3:03 PM William [name]  MRN:  [id number]  Patient location; office Provider location; office  HPI: Patient came for his follow-up appointment.  He is taking trazodone 100 mg at bedtime.  He is counting days as 63 more days left until he retired on March 06, 2022.  Recently had a visit with his podiatrist because of his ankle.  He wanted to have a intermittent permission to go to the office as a working virtually.  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  He is sleeping good.  He is taking Seroquel, Zoloft and Wellbutrin given by PCP.  He is taking Xanax 1 mg 2 times a day and very rarely needed a third 1 if he is under a lot of stress.  His appetite is okay.  His energy level is good.  He denies any suicidal thoughts, crying spells or any feeling of hopelessness or worthlessness.  He is hoping his plans solidify for retirement is looking forward for March 06, 2022.  He has no tremors, shakes or any EPS.  He denies any worsening of nightmares or flashback.  Patient did talk about cutting down and weaning himself out from the medication in the future.  He is taking multiple psychotropic medication and he asked about possible side effects and long-term effects on the brain.  I discussed benzodiazepine dependency, long-term side effects including attention concentration and memory issues.  He is hoping to come off from these medication in the future.  He is slowly and gradually achieving his goals for medication reduction.   Visit Diagnosis:    ICD-10-CM   1. Anxiety and depression  F41.9    F32.A     2. Chronic post-traumatic stress disorder (PTSD)  F43.12       Past Psychiatric History: History of depression and anxiety.  He was also given the diagnosis of ADHD by Dr.Badawi.  He tried Wellbutrin but did not continued along.  He started taking medication in year 2000 by primary care  physician.  Initially tried Paxil but due to side effects which he describes shocklike buzz he stopped.  Denies any history of suicidal attempt but endorsed history of suicidal thoughts, mood swing, irritability, anger and panic attacks.  Past Medical History:  Past Medical History:  Diagnosis Date   Adjustment disorder with mixed anxiety and depressed mood    Arthritis    Asthma    history   Diabetes mellitus    type 2   Elevated blood pressure    history of high blood pressure readings   History of chicken pox    Hypercholesteremia    Hypertension    Internal hemorrhoids    Tubular adenoma of colon     Past Surgical History:  Procedure Laterality Date   ELBOW SURGERY     right elbow 2010, left elbow 08/06/10 ([facility])   KNEE SURGERY     right knee new ACL- 1993,2000   KNEE SURGERY     left knee 2003   NASAL SEPTUM SURGERY     1992   PARTIAL HIP ARTHROPLASTY     right hip replacement    Family Psychiatric History: Reviewed.  Family History:  Family History  Problem Relation Age of Onset   Diabetes Mother    Thyroid disease Mother        Questionable   Heart disease Father          deceased   Other Neg Hx        hypogonadism   Colon cancer Neg Hx     Social History:  Social History   Socioeconomic History   Marital status: Married    Spouse name: Not on file   Number of children: Not on file   Years of education: Not on file   Highest education level: Not on file  Occupational History   Not on file  Tobacco Use   Smoking status: Never   Smokeless tobacco: Never  Substance and Sexual Activity   Alcohol use: Yes    Alcohol/week: 7.0 - 14.0 standard drinks of alcohol    Types: 7 - 14 Glasses of wine per week   Drug use: No   Sexual activity: Not on file  Other Topics Concern   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   Married -26 marriage (2nd marriage)   daughter 13,  2 sons (2nd marriage  11,6)   [place]   Never  Smoked    Alcohol use-yes   Drug use-no    Regular exercise-no   Smoking Status:  never   Does Patient Exercise:  no   Caffeine use/day:  3-4 cups coffee daily   Drug Use:  no         Social Determinants of Health   Financial Resource Strain: Not on file  Food Insecurity: Not on file  Transportation Needs: Not on file  Physical Activity: Not on file  Stress: Not on file  Social Connections: Not on file    Allergies: No Known Allergies  Metabolic Disorder Labs: Lab Results  Component Value Date   HGBA1C 7.7 (A) 10/29/2021   MPG 246 07/04/2017   MPG 174 12/09/2016   Lab Results  Component Value Date   PROLACTIN 3.4 09/27/2014   Lab Results  Component Value Date   CHOL 123 01/04/2021   TRIG (H) 01/04/2021    500.0 Triglyceride is over 400; calculations on Lipids are invalid.   HDL 24.00 (L) 01/04/2021   CHOLHDL 5 01/04/2021   VLDL 46.2 (H) 07/18/2020   LDLCALC 154 (H) 12/09/2016   LDLCALC 73 06/28/2016   Lab Results  Component Value Date   TSH 2.89 12/14/2019   TSH 3.91 06/28/2016    Therapeutic Level Labs: No results found for: "LITHIUM" No results found for: "VALPROATE" No results found for: "CBMZ"  Current Medications: Current Outpatient Medications  Medication Sig Dispense Refill   acetaminophen-codeine (TYLENOL #3) 300-30 MG tablet Take 1 tablet by mouth every 6 (six) hours as needed. 16 tablet 0   ammonium lactate (AMLACTIN) 12 % lotion Apply 1 application topically as needed for dry skin. 400 g 0   amoxicillin (AMOXIL) 500 MG capsule TAKE 1 TABLET 3 TIMES PER DAY UNTIL FINISHED (Patient not taking: Reported on 10/29/2021) 21 capsule 0   Blood Glucose Monitoring Suppl (ACCU-CHEK COMPACT CARE KIT) KIT Check blood sugar twice daily. Dx:E11.9 1 each 0   buPROPion (WELLBUTRIN XL) 300 MG 24 hr tablet TAKE 1 TABLET BY MOUTH ONCE DAILY **NEED DIABETIC FOLLOW UP** 90 tablet 0   celecoxib (CELEBREX) 100 MG capsule Take 1 capsule (100 mg total) by mouth 2 (two)  times daily. 180 capsule 1   chlorhexidine (PERIOGARD) 0.12 % solution RINSE MOUTH WITH 15ML (1 CAPFUL) FOR 30 SECONDS AM AND PM AFTER TOOTHBRUSHING. EXPECTORATE AFTER RINSING, DO NOT SWALLOW 473 mL 3   chlorhexidine (PERIOGARD) 0.12 % solution RINSE MOUTH  WITH 15ML (1 CAPFUL) FOR 30 SECONDS AM AND PM AFTER TOOTHBRUSHING. EXPECTORATE AFTER RINSING, DO NOT SWALLOW 473 mL 3   chlorhexidine (PERIOGARD) 0.12 % solution RINSE MOUTH WITH 15ML (1 CAPFUL) FOR 30 SECONDS AM AND PM AFTER TOOTHBRUSHING. EXPECTORATE AFTER RINSING, DO NOT SWALLOW 473 mL 0   Continuous Blood Gluc Sensor (FREESTYLE LIBRE 3 SENSOR) MISC Use as directed every 14 days 6 each 3   COVID-19 mRNA bivalent vaccine, Moderna, (MODERNA COVID-19 BIVAL BOOSTER) 50 MCG/0.5ML injection Inject into the muscle. 0.5 mL 0   COVID-19 mRNA vaccine, Moderna, (MODERNA COVID-19 VACCINE) 100 MCG/0.5ML injection Inject into the muscle. 0.25 mL 0   diclofenac (VOLTAREN) 75 MG EC tablet Take 1 tablet (75 mg total) by mouth 2 (two) times daily. 50 tablet 2   Empagliflozin-metFORMIN HCl (SYNJARDY) 12.06-998 MG TABS Take 1 tablet by mouth 2 (two) times daily. 180 tablet 3   fenofibrate (TRICOR) 145 MG tablet TAKE 1 TABLET (145 MG TOTAL) BY MOUTH DAILY. 90 tablet 1   fluticasone (FLONASE) 50 MCG/ACT nasal spray Place 2 sprays into both nostrils daily. 16 g 1   influenza vac split quadrivalent PF (FLUARIX) 0.5 ML injection Inject into the muscle. 0.5 mL 0   Insulin Glargine (BASAGLAR KWIKPEN) 100 UNIT/ML Inject 70 Units into the skin daily. 75 mL 3   Insulin Pen Needle 29G X 12.7MM MISC Use as directed daily in the afternoon 100 each 3   lisinopril-hydrochlorothiazide (ZESTORETIC) 10-12.5 MG tablet TAKE 1 TABLET BY MOUTH ONCE DAILY 90 tablet 0   ondansetron (ZOFRAN-ODT) 4 MG disintegrating tablet Take 1 tablet (4 mg total) by mouth every 8 (eight) hours as needed for nausea or vomiting. 20 tablet 0   QUEtiapine (SEROQUEL) 25 MG tablet Take 1 tablet (25 mg total)  by mouth at bedtime. 90 tablet 2   rosuvastatin (CRESTOR) 40 MG tablet TAKE 1 TABLET (40 MG TOTAL) BY MOUTH DAILY. 90 tablet 1   Semaglutide, 2 MG/DOSE, (OZEMPIC, 2 MG/DOSE,) 8 MG/3ML SOPN Inject 2 mg into the skin once a week. 9 mL 3   sertraline (ZOLOFT) 100 MG tablet Take 1 tablet (100 mg total) by mouth 2 (two) times daily. 180 tablet 1   Sod Fluoride-Potassium Nitrate (PREVIDENT 5000 SENSITIVE) 1.1-5 % GEL USE AS DIRECTED ON PACKAGE 100 mL 1   Sodium Fluoride (PREVIDENT 5000 BOOSTER PLUS) 1.1 % PSTE USE A PEA SIZE AMOUNT 2 TIMES PER DAY AS DIRECTED 100 mL 3   Sodium Fluoride (PREVIDENT 5000 BOOSTER PLUS) 1.1 % PSTE USE A PEA SIZE AMOUNT TO BRUSH TEETH 2 TIMES PER DAY 100 mL 3   sodium fluoride (PREVIDENT 5000 PLUS) 1.1 % CREA dental cream apply a pea size amount to tooth brush and brush teeth for 2 minutes preferably at bedtime 51 g 0   Sodium Fluoride 1.1 % PSTE Apply a pea-sized amount of the paste to a toothbrush and brush thoroughly for 2 minutes preferably at bedtime. 100 mL 0   SYRINGE-NEEDLE, DISP, 3 ML (B-D 3CC LUER-LOK SYR 22GX1") 22G X 1" 3 ML MISC Use to inject testosterone 10 each 3   tadalafil (CIALIS) 20 MG tablet Take 1 tablet (20 mg total) by mouth daily as needed for erectile dysfunction. 30 tablet 5   testosterone enanthate (DELATESTRYL) 200 MG/ML injection Inject 0.25 mLs (50 mg total) into the muscle every 14 (fourteen) days. 5 mL 5   traZODone (DESYREL) 100 MG tablet Take 1 tablet (100 mg total) by mouth at bedtime. 90 tablet 0     No current facility-administered medications for this visit.     Musculoskeletal: Strength & Muscle Tone: within normal limits Gait & Station:  Some difficulty in walking because of plantar fasciitis pain Patient leans:  See above  Psychiatric Specialty Exam: Review of Systems  There were no vitals taken for this visit.There is no height or weight on file to calculate BMI.  General Appearance: Meticulous and Well Groomed  Eye Contact:   Good  Speech:  Clear and Coherent  Volume:  Normal  Mood:  Anxious  Affect:  Congruent  Thought Process:  Goal Directed  Orientation:  Full (Time, Place, and Person)  Thought Content: WDL   Suicidal Thoughts:  No  Homicidal Thoughts:  No  Memory:  Immediate;   Good Recent;   Good Remote;   Good  Judgement:  Intact  Insight:  Present  Psychomotor Activity:  Normal  Concentration:  Concentration: Good and Attention Span: Good  Recall:  Good  Fund of Knowledge: Good  Language: Good  Akathisia:  No  Handed:  Right  AIMS (if indicated): not done  Assets:  Communication Skills Desire for [place] Talents/Skills Transportation  ADL's:  Intact  Cognition: WNL  Sleep:   Better   Screenings: IT sales professional Office Visit from 08/31/2021 in [place] at [place] Visit from 07/21/2020 in [place] at [place] Video Visit from 12/06/2019 in [place] Video Visit from 06/11/2019 in [place] Office Visit from 08/13/2018 in [place]  PHQ-2 Total Score 6 0 _0 PHQ-9 Total Score 14 -- _1 Assessment and Plan: PTSD.  Anxiety and depression.  Recommend to continue trazodone 100 mg at bedtime.  Recommend to cut down the Xanax to try 0.5 mg twice a day up to 3 times a day.  Discussed about his retirement plan and we discussed reducing further medication after the retirement.  Patient agreed to have a follow-up in 3 months.  I recommend to call us back if is any question, concern or if he feels worsening of the symptoms.  He is taking Wellbutrin XL 300 mg daily, quetiapine 25 mg daily Zoloft 100 mg 2 times a day and Xanax from his PCP.  A new prescription of trazodone 1 mg sent to his pharmacy.  Collaboration of Care: Collaboration of Care: Other  provider involved in patient's care AEB notes are available in epic to review.  Patient/Guardian was advised Release of Information must be obtained prior to any record release in order to collaborate their care with an outside provider. Patient/Guardian was advised if they have not already done so to contact the registration department to sign all necessary forms in order for [place] to release information regarding their care.   Consent: Patient/Guardian gives verbal consent for treatment and assignment of benefits for services provided during this visit. Patient/Guardian expressed understanding and agreed to proceed.    Kathlee Nations, MD 11/29/2021, 3:03 PM

## 2021-11-30 NOTE — Progress Notes (Signed)
Subjective:   Patient ID: William [name], male   DOB: 61 y.o.   MRN: [id number]   HPI Patient states he was at work today walking and he developed some sharp pain in his foot and wanted to have this checked.  Its not in the area where he had his previous pain that is more dorsally and seems to be improving somewhat but still sore    ROS      Objective:  Physical Exam  Neurovascular status intact with patient noted to have inflammation pain of the dorsal lateral aspect of the left foot around the fourth and fifth metatarsal base and into the proximal shaft.  There is mild edema associated with it     Assessment:  Possibility for a tendinitis-like condition into this area versus stress fracture or other pathology     Plan:  H&P x-ray reviewed condition discussed.  At this point I cannot see a change on x-ray but it is possible and I discussed rigid bottom shoes reduced weightbearing ice therapy and topical anti-inflammatories.  If symptoms get worse may require immobilization injection treatment.  Patient will be seen back as needed with all questions answered today.  Still has slight Planter fasciitis pain lateral foot but those are not to the degree of what this is now presented with  X-rays were negative for signs of fracture or any indications of bony contusion currently

## 2021-12-06 ENCOUNTER — Other Ambulatory Visit (HOSPITAL_BASED_OUTPATIENT_CLINIC_OR_DEPARTMENT_OTHER): Payer: Self-pay

## 2021-12-06 MED ORDER — CHLORHEXIDINE GLUCONATE 0.12 % MT SOLN
OROMUCOSAL | 2 refills | Status: DC
  Filled 2021-12-06: qty 473, 16d supply, fill #0
  Filled 2022-01-02: qty 473, 16d supply, fill #1

## 2021-12-06 MED ORDER — ACETAMINOPHEN-CODEINE 300-30 MG PO TABS
1.0000 | ORAL_TABLET | Freq: Four times a day (QID) | ORAL | 0 refills | Status: DC | PRN
  Filled 2021-12-06: qty 16, 4d supply, fill #0

## 2021-12-07 ENCOUNTER — Other Ambulatory Visit: Payer: Self-pay | Admitting: Family Medicine

## 2021-12-07 ENCOUNTER — Other Ambulatory Visit (HOSPITAL_BASED_OUTPATIENT_CLINIC_OR_DEPARTMENT_OTHER): Payer: Self-pay

## 2021-12-07 DIAGNOSIS — F32A Depression, unspecified: Secondary | ICD-10-CM

## 2021-12-07 DIAGNOSIS — I10 Essential (primary) hypertension: Secondary | ICD-10-CM

## 2021-12-07 MED ORDER — QUETIAPINE FUMARATE 25 MG PO TABS
25.0000 mg | ORAL_TABLET | Freq: Every day | ORAL | 2 refills | Status: DC
  Filled 2021-12-07: qty 90, 90d supply, fill #0
  Filled 2022-02-16: qty 90, 90d supply, fill #1

## 2021-12-07 MED ORDER — SILDENAFIL CITRATE 100 MG PO TABS
50.0000 mg | ORAL_TABLET | Freq: Every day | ORAL | 2 refills | Status: DC | PRN
  Filled 2021-12-07: qty 90, 90d supply, fill #0
  Filled 2022-04-04: qty 90, 90d supply, fill #1
  Filled 2022-10-28: qty 90, 90d supply, fill #2

## 2021-12-07 MED ORDER — LISINOPRIL-HYDROCHLOROTHIAZIDE 10-12.5 MG PO TABS
1.0000 | ORAL_TABLET | Freq: Every day | ORAL | 11 refills | Status: DC
  Filled 2021-12-07: qty 30, 30d supply, fill #0
  Filled 2022-01-09: qty 30, 30d supply, fill #1
  Filled 2022-04-04: qty 30, 30d supply, fill #2

## 2021-12-07 MED ORDER — BUPROPION HCL ER (XL) 300 MG PO TB24
300.0000 mg | ORAL_TABLET | Freq: Every day | ORAL | 11 refills | Status: DC
  Filled 2021-12-07: qty 30, 30d supply, fill #0
  Filled 2022-01-09: qty 30, 30d supply, fill #1

## 2021-12-07 NOTE — Telephone Encounter (Signed)
This was discontinued.

## 2021-12-10 ENCOUNTER — Other Ambulatory Visit (HOSPITAL_BASED_OUTPATIENT_CLINIC_OR_DEPARTMENT_OTHER): Payer: Self-pay

## 2021-12-11 ENCOUNTER — Other Ambulatory Visit (HOSPITAL_BASED_OUTPATIENT_CLINIC_OR_DEPARTMENT_OTHER): Payer: Self-pay

## 2021-12-13 ENCOUNTER — Other Ambulatory Visit: Payer: Self-pay | Admitting: Family Medicine

## 2021-12-13 ENCOUNTER — Other Ambulatory Visit (HOSPITAL_BASED_OUTPATIENT_CLINIC_OR_DEPARTMENT_OTHER): Payer: Self-pay

## 2021-12-14 ENCOUNTER — Other Ambulatory Visit (HOSPITAL_BASED_OUTPATIENT_CLINIC_OR_DEPARTMENT_OTHER): Payer: Self-pay

## 2021-12-21 ENCOUNTER — Encounter: Payer: Self-pay | Admitting: Podiatry

## 2021-12-21 ENCOUNTER — Ambulatory Visit: Payer: Federal, State, Local not specified - PPO | Admitting: Podiatry

## 2021-12-21 DIAGNOSIS — M722 Plantar fascial fibromatosis: Secondary | ICD-10-CM

## 2021-12-21 DIAGNOSIS — M7672 Peroneal tendinitis, left leg: Secondary | ICD-10-CM

## 2021-12-21 DIAGNOSIS — S92215A Nondisplaced fracture of cuboid bone of left foot, initial encounter for closed fracture: Secondary | ICD-10-CM | POA: Diagnosis not present

## 2021-12-25 NOTE — Progress Notes (Signed)
Subjective:   Patient ID: William [name], male   DOB: 61 y.o.   MRN: [id number]   HPI Patient presents pain is still the same and he presents with paperwork for me to work on   ROS      Objective:  Physical Exam  Neurovascular status intact with discomfort in the plantar heel lateral side of the left foot which has been relative chronic and stable in nature but hard for him to be ambulatory     Assessment:  Chronic tendinitis fasciitis condition     Plan:  Reviewed work and discussed with him chronic nature of condition and patient had all questions answered today will be seen back as needed

## 2022-01-02 ENCOUNTER — Other Ambulatory Visit (HOSPITAL_BASED_OUTPATIENT_CLINIC_OR_DEPARTMENT_OTHER): Payer: Self-pay

## 2022-01-08 ENCOUNTER — Ambulatory Visit: Payer: Federal, State, Local not specified - PPO | Admitting: Urology

## 2022-01-08 NOTE — Progress Notes (Deleted)
Assessment: 1. Rising PSA level     Plan: ***  Chief Complaint: No chief complaint on file.   History of Present Illness:  William [name] is a 61 y.o. male who is seen in consultation from Shelda Pal, DO for evaluation of a rising PSA.  PSA results: 5/19 0.3 9/19 0.38 2/20 0.36 7/20 0.37 5/21 0.26 11/21 0.67 3/22 0.28 5/23 0.24 9/23 3.21   He has been in on testosterone replacement therapy with testosterone injections 50 mg every 2 weeks.  Recently, he was receiving the injections weekly. Testosterone level from 9/23: 617  Past Medical History:  Past Medical History:  Diagnosis Date   Adjustment disorder with mixed anxiety and depressed mood    Arthritis    Asthma    history   Diabetes mellitus    type 2   Elevated blood pressure    history of high blood pressure readings   History of chicken pox    Hypercholesteremia    Hypertension    Internal hemorrhoids    Tubular adenoma of colon     Past Surgical History:  Past Surgical History:  Procedure Laterality Date   ELBOW SURGERY     right elbow 2010, left elbow 08/06/10 [name])   KNEE SURGERY     right knee new ACL- [phone]   KNEE SURGERY     left knee 2003   NASAL SEPTUM SURGERY     1992   PARTIAL HIP ARTHROPLASTY     right hip replacement    Allergies:  No Known Allergies  Family History:  Family History  Problem Relation Age of Onset   Diabetes Mother    Thyroid disease Mother        Questionable   Heart disease Father        deceased   Other Neg Hx        hypogonadism   Colon cancer Neg Hx     Social History:  Social History   Tobacco Use   Smoking status: Never   Smokeless tobacco: Never  Substance Use Topics   Alcohol use: Yes    Alcohol/week: 7.0 - 14.0 standard drinks of alcohol    Types: 7 - 14 Glasses of wine per week   Drug use: No    Review of symptoms:  Constitutional:  Negative for unexplained weight loss, night sweats, fever,  chills ENT:  Negative for nose bleeds, sinus pain, painful swallowing CV:  Negative for chest pain, shortness of breath, exercise intolerance, palpitations, loss of consciousness Resp:  Negative for cough, wheezing, shortness of breath GI:  Negative for nausea, vomiting, diarrhea, bloody stools GU:  Positives noted in HPI; otherwise negative for gross hematuria, dysuria, urinary incontinence Neuro:  Negative for seizures, poor balance, limb weakness, slurred speech Psych:  Negative for lack of energy, depression, anxiety Endocrine:  Negative for polydipsia, polyuria, symptoms of hypoglycemia (dizziness, hunger, sweating) Hematologic:  Negative for anemia, purpura, petechia, prolonged or excessive bleeding, use of anticoagulants  Allergic:  Negative for difficulty breathing or choking as a result of exposure to anything; no shellfish allergy; no allergic response (rash/itch) to materials, foods  Physical exam: There were no vitals taken for this visit. GENERAL APPEARANCE:  Well appearing, well developed, well nourished, NAD HEENT: Atraumatic, Normocephalic, oropharynx clear. NECK: Supple without lymphadenopathy or thyromegaly. LUNGS: Clear to auscultation bilaterally. HEART: Regular Rate and Rhythm without murmurs, gallops, or rubs. ABDOMEN: Soft, non-tender, No Masses. EXTREMITIES: Moves all extremities well.  Without clubbing,  cyanosis, or edema. NEUROLOGIC:  Alert and oriented x 3, normal gait, CN II-XII grossly intact.  MENTAL STATUS:  Appropriate. BACK:  Non-tender to palpation.  No CVAT SKIN:  Warm, dry and intact.   GU: Penis:  {Exam; penis:5791} Meatus: {Meatus:15530} Scrotum: {pe scrotum:310183} Testis: {Exam; testicles:5790} Epididymis: {epididymis QAST:419622} Prostate: {Exam; prostate:5793} Rectum: {rectal exam:26517}'  Results: U/A:

## 2022-01-09 ENCOUNTER — Encounter: Payer: Self-pay | Admitting: Urology

## 2022-01-09 ENCOUNTER — Other Ambulatory Visit (HOSPITAL_BASED_OUTPATIENT_CLINIC_OR_DEPARTMENT_OTHER): Payer: Self-pay

## 2022-01-09 ENCOUNTER — Other Ambulatory Visit: Payer: Self-pay | Admitting: Family Medicine

## 2022-01-09 ENCOUNTER — Ambulatory Visit: Payer: Federal, State, Local not specified - PPO | Admitting: Urology

## 2022-01-09 ENCOUNTER — Ambulatory Visit (INDEPENDENT_AMBULATORY_CARE_PROVIDER_SITE_OTHER): Payer: Federal, State, Local not specified - PPO | Admitting: Urology

## 2022-01-09 VITALS — BP 131/77 | HR 86 | Ht 72.0 in | Wt 211.0 lb

## 2022-01-09 DIAGNOSIS — R3914 Feeling of incomplete bladder emptying: Secondary | ICD-10-CM

## 2022-01-09 DIAGNOSIS — R3915 Urgency of urination: Secondary | ICD-10-CM

## 2022-01-09 DIAGNOSIS — R351 Nocturia: Secondary | ICD-10-CM

## 2022-01-09 DIAGNOSIS — R399 Unspecified symptoms and signs involving the genitourinary system: Secondary | ICD-10-CM | POA: Insufficient documentation

## 2022-01-09 DIAGNOSIS — E291 Testicular hypofunction: Secondary | ICD-10-CM | POA: Diagnosis not present

## 2022-01-09 DIAGNOSIS — R972 Elevated prostate specific antigen [PSA]: Secondary | ICD-10-CM

## 2022-01-09 DIAGNOSIS — R350 Frequency of micturition: Secondary | ICD-10-CM | POA: Diagnosis not present

## 2022-01-09 DIAGNOSIS — R3912 Poor urinary stream: Secondary | ICD-10-CM

## 2022-01-09 NOTE — Progress Notes (Signed)
Assessment: 1. Rising PSA level   2. Lower urinary tract symptoms   3. Hypogonadism in male    Plan: I reviewed the patient's chart including provider notes and lab results. I discussed the potential significance of the PSA increase including the possibility of prostate cancer.  I also discussed the potential risk of continue testosterone replacement therapy in the setting of prostate cancer. PSA today - will call with results and recommendations He is not interested in any treatment for his lower urinary tract symptoms at the present time.  Chief Complaint:  Chief Complaint  Patient presents with   Elevated PSA    History of Present Illness:  William [name] is a 61 y.o. male who is seen in consultation from Cloyd Stagers, MD for evaluation of a rising PSA.  PSA results: 5/19 0.3 9/19 0.38 2/20 0.36 7/20 0.37 5/21 0.26 11/21 0.67 3/22 0.28 5/23 0.24 9/23 3.21  No history of elevated PSA.  No history of UTIs or prostatitis.  No family history of prostate cancer.  He has been in on testosterone replacement therapy with testosterone injections 50 mg every 2 weeks.  Testosterone level from 9/23: 617 His last injection was approximately 2 weeks ago. He has been on testosterone replacement therapy for approximately 10 years.  He initially used topical gel without significant benefit.  He has been receiving short acting injections for approximately 6 years.  He is currently being followed by Dr. Kelton Pillar with endocrinology.  He has lower urinary tract symptoms including frequency, urgency, nocturia x 2, decreased stream, intermittent stream, and sensation of incomplete emptying.  He has also noted occasional dysuria.  No gross hematuria. IPSS = 25 today.  Past Medical History:  Past Medical History:  Diagnosis Date   Adjustment disorder with mixed anxiety and depressed mood    Arthritis    Asthma    history   Diabetes mellitus    type 2   Elevated blood  pressure    history of high blood pressure readings   History of chicken pox    Hypercholesteremia    Hypertension    Internal hemorrhoids    Tubular adenoma of colon     Past Surgical History:  Past Surgical History:  Procedure Laterality Date   ELBOW SURGERY     right elbow 2010, left elbow 08/06/10 [name])   KNEE SURGERY     right knee new ACL- [phone]   KNEE SURGERY     left knee 2003   NASAL SEPTUM SURGERY     1992   PARTIAL HIP ARTHROPLASTY     right hip replacement    Allergies:  No Known Allergies  Family History:  Family History  Problem Relation Age of Onset   Diabetes Mother    Thyroid disease Mother        Questionable   Heart disease Father        deceased   Other Neg Hx        hypogonadism   Colon cancer Neg Hx     Social History:  Social History   Tobacco Use   Smoking status: Never   Smokeless tobacco: Never  Substance Use Topics   Alcohol use: Yes    Alcohol/week: 7.0 - 14.0 standard drinks of alcohol    Types: 7 - 14 Glasses of wine per week   Drug use: No    Review of symptoms:  Constitutional:  Negative for unexplained weight loss, night sweats, fever, chills ENT:  Negative for nose bleeds, sinus pain, painful swallowing CV:  Negative for chest pain, shortness of breath, exercise intolerance, palpitations, loss of consciousness Resp:  Negative for cough, wheezing, shortness of breath GI:  Negative for nausea, vomiting, diarrhea, bloody stools GU:  Positives noted in HPI; otherwise negative for gross hematuria, dysuria, urinary incontinence Neuro:  Negative for seizures, poor balance, limb weakness, slurred speech Psych:  Negative for lack of energy, depression, anxiety Endocrine:  Negative for polydipsia, polyuria, symptoms of hypoglycemia (dizziness, hunger, sweating) Hematologic:  Negative for anemia, purpura, petechia, prolonged or excessive bleeding, use of anticoagulants  Allergic:  Negative for difficulty breathing or  choking as a result of exposure to anything; no shellfish allergy; no allergic response (rash/itch) to materials, foods  Physical exam: BP 131/77   Pulse 86   Ht 6' (1.829 m)   Wt 211 lb (95.7 kg)   BMI 28.62 kg/m  GENERAL APPEARANCE:  Well appearing, well developed, well nourished, NAD HEENT: Atraumatic, Normocephalic, oropharynx clear. NECK: Supple without lymphadenopathy or thyromegaly. LUNGS: Clear to auscultation bilaterally. HEART: Regular Rate and Rhythm without murmurs, gallops, or rubs. ABDOMEN: Soft, non-tender, No Masses. EXTREMITIES: Moves all extremities well.  Without clubbing, cyanosis, or edema. NEUROLOGIC:  Alert and oriented x 3, normal gait, CN II-XII grossly intact.  MENTAL STATUS:  Appropriate. BACK:  Non-tender to palpation.  No CVAT SKIN:  Warm, dry and intact.   GU: Penis:  circumcised Meatus: Normal Scrotum: normal, no masses Testis: normal without masses bilateral Epididymis: normal Prostate: 30 g, NT, no nodules Rectum: Normal tone,  no masses or tenderness'  Results: U/A dipstick:  3+ glucose

## 2022-01-09 NOTE — Telephone Encounter (Signed)
Last OV--10/12/2021 Last RF---05/10/2021---#90

## 2022-01-10 ENCOUNTER — Other Ambulatory Visit (HOSPITAL_BASED_OUTPATIENT_CLINIC_OR_DEPARTMENT_OTHER): Payer: Self-pay

## 2022-01-10 ENCOUNTER — Encounter: Payer: Self-pay | Admitting: Urology

## 2022-01-10 LAB — URINALYSIS
Bilirubin, UA: NEGATIVE
Blood, UA: NEGATIVE
Glucose, UA: NEGATIVE mg/dL
Ketones, UA: NEGATIVE
Leukocytes, UA: NEGATIVE
Nitrite, UA: NEGATIVE
Protein, UA: NEGATIVE
Spec Grav, UA: 1.01 (ref 1.010–1.025)
Urobilinogen, UA: NORMAL
pH, UA: 6 (ref 5.0–8.0)

## 2022-01-10 LAB — PSA: Prostate Specific Ag, Serum: 0.7 ng/mL (ref 0.0–4.0)

## 2022-01-15 ENCOUNTER — Other Ambulatory Visit (HOSPITAL_BASED_OUTPATIENT_CLINIC_OR_DEPARTMENT_OTHER): Payer: Self-pay

## 2022-01-15 MED ORDER — PREVIDENT 5000 BOOSTER PLUS 1.1 % DT PSTE
PASTE | DENTAL | 4 refills | Status: DC
  Filled 2022-01-15 – 2022-04-04 (×2): qty 100, 30d supply, fill #0

## 2022-01-15 MED ORDER — CHLORHEXIDINE GLUCONATE 0.12 % MT SOLN
OROMUCOSAL | 4 refills | Status: DC
  Filled 2022-01-15: qty 473, 30d supply, fill #0
  Filled 2022-01-31 (×3): qty 473, 30d supply, fill #1
  Filled 2022-03-04: qty 473, 30d supply, fill #3
  Filled 2022-03-04: qty 473, 30d supply, fill #2
  Filled 2022-04-26: qty 473, 30d supply, fill #4

## 2022-01-23 ENCOUNTER — Other Ambulatory Visit: Payer: Self-pay | Admitting: Psychiatry

## 2022-01-23 ENCOUNTER — Other Ambulatory Visit (HOSPITAL_BASED_OUTPATIENT_CLINIC_OR_DEPARTMENT_OTHER): Payer: Self-pay

## 2022-01-23 DIAGNOSIS — M546 Pain in thoracic spine: Secondary | ICD-10-CM | POA: Diagnosis not present

## 2022-01-23 DIAGNOSIS — M9902 Segmental and somatic dysfunction of thoracic region: Secondary | ICD-10-CM | POA: Diagnosis not present

## 2022-01-23 DIAGNOSIS — M5417 Radiculopathy, lumbosacral region: Secondary | ICD-10-CM | POA: Diagnosis not present

## 2022-01-23 DIAGNOSIS — M9903 Segmental and somatic dysfunction of lumbar region: Secondary | ICD-10-CM | POA: Diagnosis not present

## 2022-01-31 ENCOUNTER — Other Ambulatory Visit (HOSPITAL_BASED_OUTPATIENT_CLINIC_OR_DEPARTMENT_OTHER): Payer: Self-pay

## 2022-02-06 ENCOUNTER — Other Ambulatory Visit (HOSPITAL_BASED_OUTPATIENT_CLINIC_OR_DEPARTMENT_OTHER): Payer: Self-pay

## 2022-02-06 ENCOUNTER — Other Ambulatory Visit: Payer: Self-pay | Admitting: Psychiatry

## 2022-02-07 ENCOUNTER — Ambulatory Visit: Payer: Federal, State, Local not specified - PPO | Admitting: Family Medicine

## 2022-02-14 ENCOUNTER — Other Ambulatory Visit (HOSPITAL_BASED_OUTPATIENT_CLINIC_OR_DEPARTMENT_OTHER): Payer: Self-pay

## 2022-02-14 MED ORDER — COMIRNATY 30 MCG/0.3ML IM SUSY
PREFILLED_SYRINGE | INTRAMUSCULAR | 0 refills | Status: DC
  Filled 2022-02-14: qty 0.3, 1d supply, fill #0

## 2022-02-16 ENCOUNTER — Other Ambulatory Visit (HOSPITAL_BASED_OUTPATIENT_CLINIC_OR_DEPARTMENT_OTHER): Payer: Self-pay

## 2022-02-16 ENCOUNTER — Other Ambulatory Visit (HOSPITAL_COMMUNITY): Payer: Self-pay | Admitting: Psychiatry

## 2022-02-16 DIAGNOSIS — F32A Depression, unspecified: Secondary | ICD-10-CM

## 2022-02-19 ENCOUNTER — Other Ambulatory Visit (HOSPITAL_BASED_OUTPATIENT_CLINIC_OR_DEPARTMENT_OTHER): Payer: Self-pay

## 2022-02-25 ENCOUNTER — Other Ambulatory Visit: Payer: Self-pay | Admitting: Internal Medicine

## 2022-02-25 ENCOUNTER — Other Ambulatory Visit (HOSPITAL_BASED_OUTPATIENT_CLINIC_OR_DEPARTMENT_OTHER): Payer: Self-pay

## 2022-02-26 ENCOUNTER — Other Ambulatory Visit (HOSPITAL_BASED_OUTPATIENT_CLINIC_OR_DEPARTMENT_OTHER): Payer: Self-pay

## 2022-02-26 MED ORDER — OZEMPIC (2 MG/DOSE) 8 MG/3ML ~~LOC~~ SOPN
2.0000 mg | PEN_INJECTOR | SUBCUTANEOUS | 3 refills | Status: DC
  Filled 2022-02-26 – 2022-04-04 (×2): qty 9, 84d supply, fill #0
  Filled 2022-07-02: qty 9, 84d supply, fill #1
  Filled 2022-10-04: qty 9, 84d supply, fill #2

## 2022-02-28 ENCOUNTER — Other Ambulatory Visit (HOSPITAL_BASED_OUTPATIENT_CLINIC_OR_DEPARTMENT_OTHER): Payer: Self-pay

## 2022-03-04 ENCOUNTER — Other Ambulatory Visit (HOSPITAL_BASED_OUTPATIENT_CLINIC_OR_DEPARTMENT_OTHER): Payer: Self-pay

## 2022-03-07 ENCOUNTER — Telehealth (HOSPITAL_COMMUNITY): Payer: Self-pay | Admitting: *Deleted

## 2022-03-07 ENCOUNTER — Ambulatory Visit (HOSPITAL_COMMUNITY): Payer: Federal, State, Local not specified - PPO | Admitting: Psychiatry

## 2022-03-07 ENCOUNTER — Other Ambulatory Visit (HOSPITAL_COMMUNITY): Payer: Self-pay | Admitting: *Deleted

## 2022-03-07 ENCOUNTER — Other Ambulatory Visit (HOSPITAL_BASED_OUTPATIENT_CLINIC_OR_DEPARTMENT_OTHER): Payer: Self-pay

## 2022-03-07 DIAGNOSIS — F419 Anxiety disorder, unspecified: Secondary | ICD-10-CM

## 2022-03-07 MED ORDER — TRAZODONE HCL 100 MG PO TABS
100.0000 mg | ORAL_TABLET | Freq: Every day | ORAL | 0 refills | Status: DC
  Filled 2022-03-07: qty 6, 6d supply, fill #0

## 2022-03-07 MED ORDER — TRAZODONE HCL 100 MG PO TABS
100.0000 mg | ORAL_TABLET | Freq: Every day | ORAL | 0 refills | Status: DC
  Filled 2022-03-07: qty 35, 35d supply, fill #0

## 2022-03-07 NOTE — Telephone Encounter (Signed)
I will not do Xanax since it is prescribed by PCP.  I can forward my note to Dr. Nani Ravens and message him if he can prescribe Xanax.

## 2022-03-07 NOTE — Telephone Encounter (Signed)
Writer spoke to pt to advise that we do not prescribe Xanax and was offered Klonopin 0.5 mg #15. Med ed initiated and reinforced. Pt does not want to change medications as of now and requests that you reach out to Dr. Nani Ravens to verify pt has been getting Xanax 1 mg #60 1-2 QHS. PRN. Pt states he's weaning himself off the Xanax and does not want to change medications now. Please review and advise.

## 2022-03-07 NOTE — Telephone Encounter (Signed)
Patient was offered a virtual appointment but he insists to be seen in person.  His Zoloft, quetiapine, Xanax and Wellbutrin is given by PCP.  He is cutting down his Xanax and if he need to refill the Xanax he need to contact his PCP.  We can provide trazodone 100 mg at bedtime which is given by our office until his next appointment.  We can offer Klonopin 0.5 mg to take as needed for severe anxiety #15 if he does not want Xanax from the PCP.

## 2022-03-07 NOTE — Telephone Encounter (Signed)
Pt does not want to change medications. He requests that you reach out to Dr. Nani Ravens to ok to you that pt can have Xanax . See encounter please.

## 2022-03-07 NOTE — Telephone Encounter (Signed)
Pt called requesting refills on Trazodone and Xanax. Xanax has been prescribed by another provider. Pt says he's going on vacation and needs refills. Pt last seen on 11/30/22. Pt had an appointment for today that has been rescheduled for 04/10/22. Please review.

## 2022-03-07 NOTE — Telephone Encounter (Signed)
My understanding is Dr. Adele Schilder was trying to wean off of his substantial Xanax usage which all 3 parties were in agreement with. If this transition is part of that, I would encourage the patient to work with Dr. Adele Schilder to continue this medically appropriate change. Ty.

## 2022-03-08 NOTE — Telephone Encounter (Signed)
Writer spoke with pt who asked if Dr. Nani Ravens was going to fill script. Writer reiterated that Dr. Nani Ravens is referring this to Dr. Adele Schilder as pt is now being seen in our clinic. Pt again refuesd the Klonopin.0.5 mg. BID. Pt will f/u on next appointment with D r.Arfeen on 04/10/22.

## 2022-03-08 NOTE — Telephone Encounter (Signed)
Dr. Nani Ravens recommends transitioning to Klonopin under the care of Dr. Adele Schilder

## 2022-03-09 ENCOUNTER — Encounter: Payer: Self-pay | Admitting: Family Medicine

## 2022-03-09 NOTE — Telephone Encounter (Signed)
He was referred for medication management.  Not necessarily to prescribe Xanax.  I have contacted his PCP and he is aware about our decision.

## 2022-03-11 ENCOUNTER — Other Ambulatory Visit (HOSPITAL_BASED_OUTPATIENT_CLINIC_OR_DEPARTMENT_OTHER): Payer: Self-pay

## 2022-03-11 ENCOUNTER — Other Ambulatory Visit: Payer: Self-pay | Admitting: Family Medicine

## 2022-03-11 MED ORDER — ALPRAZOLAM 1 MG PO TABS
1.0000 mg | ORAL_TABLET | Freq: Two times a day (BID) | ORAL | 0 refills | Status: DC | PRN
  Filled 2022-03-11 – 2022-04-04 (×2): qty 60, 30d supply, fill #0

## 2022-03-19 ENCOUNTER — Other Ambulatory Visit (HOSPITAL_BASED_OUTPATIENT_CLINIC_OR_DEPARTMENT_OTHER): Payer: Self-pay

## 2022-04-02 ENCOUNTER — Other Ambulatory Visit (HOSPITAL_BASED_OUTPATIENT_CLINIC_OR_DEPARTMENT_OTHER): Payer: Self-pay

## 2022-04-04 ENCOUNTER — Other Ambulatory Visit (HOSPITAL_BASED_OUTPATIENT_CLINIC_OR_DEPARTMENT_OTHER): Payer: Self-pay

## 2022-04-04 ENCOUNTER — Other Ambulatory Visit: Payer: Self-pay | Admitting: Family Medicine

## 2022-04-04 ENCOUNTER — Other Ambulatory Visit: Payer: Self-pay | Admitting: Internal Medicine

## 2022-04-04 DIAGNOSIS — E785 Hyperlipidemia, unspecified: Secondary | ICD-10-CM

## 2022-04-04 MED ORDER — ROSUVASTATIN CALCIUM 40 MG PO TABS
40.0000 mg | ORAL_TABLET | Freq: Every day | ORAL | 1 refills | Status: DC
  Filled 2022-04-04: qty 90, 90d supply, fill #0

## 2022-04-04 MED ORDER — FENOFIBRATE 145 MG PO TABS
145.0000 mg | ORAL_TABLET | Freq: Every day | ORAL | 1 refills | Status: DC
  Filled 2022-04-04: qty 90, 90d supply, fill #0

## 2022-04-05 ENCOUNTER — Other Ambulatory Visit (HOSPITAL_BASED_OUTPATIENT_CLINIC_OR_DEPARTMENT_OTHER): Payer: Self-pay

## 2022-04-08 ENCOUNTER — Other Ambulatory Visit (HOSPITAL_BASED_OUTPATIENT_CLINIC_OR_DEPARTMENT_OTHER): Payer: Self-pay

## 2022-04-10 ENCOUNTER — Ambulatory Visit (HOSPITAL_COMMUNITY): Payer: Federal, State, Local not specified - PPO | Admitting: Psychiatry

## 2022-04-11 ENCOUNTER — Other Ambulatory Visit (HOSPITAL_BASED_OUTPATIENT_CLINIC_OR_DEPARTMENT_OTHER): Payer: Self-pay

## 2022-04-16 ENCOUNTER — Other Ambulatory Visit (HOSPITAL_BASED_OUTPATIENT_CLINIC_OR_DEPARTMENT_OTHER): Payer: Self-pay

## 2022-04-19 ENCOUNTER — Other Ambulatory Visit (HOSPITAL_BASED_OUTPATIENT_CLINIC_OR_DEPARTMENT_OTHER): Payer: Self-pay

## 2022-04-23 ENCOUNTER — Other Ambulatory Visit (HOSPITAL_BASED_OUTPATIENT_CLINIC_OR_DEPARTMENT_OTHER): Payer: Self-pay

## 2022-04-25 ENCOUNTER — Encounter (HOSPITAL_COMMUNITY): Payer: Self-pay | Admitting: Psychiatry

## 2022-04-25 ENCOUNTER — Ambulatory Visit (HOSPITAL_BASED_OUTPATIENT_CLINIC_OR_DEPARTMENT_OTHER): Payer: Federal, State, Local not specified - PPO | Admitting: Psychiatry

## 2022-04-25 ENCOUNTER — Other Ambulatory Visit (HOSPITAL_BASED_OUTPATIENT_CLINIC_OR_DEPARTMENT_OTHER): Payer: Self-pay

## 2022-04-25 VITALS — BP 129/83 | HR 93 | Ht 72.0 in | Wt 204.0 lb

## 2022-04-25 DIAGNOSIS — F32A Depression, unspecified: Secondary | ICD-10-CM

## 2022-04-25 DIAGNOSIS — F419 Anxiety disorder, unspecified: Secondary | ICD-10-CM | POA: Diagnosis not present

## 2022-04-25 DIAGNOSIS — F4312 Post-traumatic stress disorder, chronic: Secondary | ICD-10-CM | POA: Diagnosis not present

## 2022-04-25 MED ORDER — SERTRALINE HCL 100 MG PO TABS
100.0000 mg | ORAL_TABLET | Freq: Every day | ORAL | 1 refills | Status: DC
  Filled 2022-04-25: qty 90, 90d supply, fill #0

## 2022-04-25 MED ORDER — TRAZODONE HCL 100 MG PO TABS
100.0000 mg | ORAL_TABLET | Freq: Every day | ORAL | 0 refills | Status: DC
  Filled 2022-04-25: qty 90, 90d supply, fill #0

## 2022-04-25 NOTE — Progress Notes (Addendum)
BH MD/PA/NP OP Progress Note  04/25/2022 3:26 PM William [name]  MRN:  [id number]  Patient location; office Provider location; office  HPI: Patient came in for his follow-up appointment.  He is relieved because he is now officially retired.  He did had a trip to [place] by himself.  Patient ask his wife but she refused so he decided to go by himself.  Patient really enjoyed his time.  He is more relaxed and he cut down his Zoloft and taking only 1 pill a day.  He is still required Xanax 0.5 mg 1 to 2 tablet as needed at bedtime.  Patient feels that he has urge to take because it keep him calm.  He also takes the trazodone at bedtime.  He has not taken quetiapine for a while.  He is happy his wife agreed to go to a cruise trip in May for 10 days to [place].  Patient told his wife has been stressed about his parents and church activities.  Patient is hoping that she will resign from church responsibilities soon.  Patient denies any anger, mania, crying spells or any feeling of hopelessness or worthlessness.  He hired an Pensions consultant to address the issue with the Clinical cytogeneticist and this case may take up to 20 months.  However he is very optimistic about it.  He has a plan to visit [place] in June for his school project and is looking forward for that trip.  He has no tremors, shakes or any EPS.  He denies any anhedonia or any feeling of hopelessness.  His long-term plan is to come off from the medication.  Visit Diagnosis:    ICD-10-CM   1. Anxiety and depression  F41.9 sertraline (ZOLOFT) 100 MG tablet   F32.A traZODone (DESYREL) 100 MG tablet      Past Psychiatric History: History of depression and anxiety.  He was also given the diagnosis of ADHD by [facility].  He tried Wellbutrin but did not continued along.  He started taking medication in year 2000 by primary care physician.  Initially tried Paxil but due to side effects which he describes shocklike buzz he stopped.  Denies any history of  suicidal attempt but endorsed history of suicidal thoughts, mood swing, irritability, anger and panic attacks.  Past Medical History:  Past Medical History:  Diagnosis Date   Adjustment disorder with mixed anxiety and depressed mood    Arthritis    Asthma    history   Diabetes mellitus    type 2   Elevated blood pressure    history of high blood pressure readings   History of chicken pox    Hypercholesteremia    Hypertension    Internal hemorrhoids    Tubular adenoma of colon     Past Surgical History:  Procedure Laterality Date   ELBOW SURGERY     right elbow 2010, left elbow 08/06/10 Wendall Mola)   KNEE SURGERY     right knee new ACL- 4098,1191   KNEE SURGERY     left knee 2003   NASAL SEPTUM SURGERY     1992   PARTIAL HIP ARTHROPLASTY     right hip replacement    Family Psychiatric History: Reviewed.  Family History:  Family History  Problem Relation Age of Onset   Diabetes Mother    Thyroid disease Mother        Questionable   Heart disease Father        deceased   Other Neg  Hx        hypogonadism   Colon cancer Neg Hx     Social History:  Social History   Socioeconomic History   Marital status: Married    Spouse name: Not on file   Number of children: Not on file   Years of education: Not on file   Highest education level: Not on file  Occupational History   Not on file  Tobacco Use   Smoking status: Never   Smokeless tobacco: Never  Substance and Sexual Activity   Alcohol use: Yes    Alcohol/week: 7.0 - 14.0 standard drinks of alcohol    Types: 7 - 14 Glasses of wine per week   Drug use: No   Sexual activity: Not on file  Other Topics Concern   Not on file  Social History Narrative   Occupation: Real Therapist, occupational   Married -15 marriage (2nd marriage)   daughter 15,  2 sons (2nd marriage  11,6)   [place]   Never Smoked    Alcohol use-yes   Drug use-no    Regular exercise-no   Smoking Status:  never   Does Patient Exercise:  no    Caffeine use/day:  3-4 cups coffee daily   Drug Use:  no         Social Determinants of Health   Financial Resource Strain: Not on file  Food Insecurity: Not on file  Transportation Needs: Not on file  Physical Activity: Not on file  Stress: Not on file  Social Connections: Not on file    Allergies: No Known Allergies  Metabolic Disorder Labs: Lab Results  Component Value Date   HGBA1C 7.7 (A) 10/29/2021   MPG 246 07/04/2017   MPG 174 12/09/2016   Lab Results  Component Value Date   PROLACTIN 3.4 09/27/2014   Lab Results  Component Value Date   CHOL 123 01/04/2021   TRIG (H) 01/04/2021    500.0 Triglyceride is over 400; calculations on Lipids are invalid.   HDL 24.00 (L) 01/04/2021   CHOLHDL 5 01/04/2021   VLDL 46.2 (H) 07/18/2020   LDLCALC 154 (H) 12/09/2016   LDLCALC 73 06/28/2016   Lab Results  Component Value Date   TSH 2.89 12/14/2019   TSH 3.91 06/28/2016    Therapeutic Level Labs: No results found for: "LITHIUM" No results found for: "VALPROATE" No results found for: "CBMZ"  Current Medications: Current Outpatient Medications  Medication Sig Dispense Refill   acetaminophen-codeine (TYLENOL #3) 300-30 MG tablet Take 1 tablet by mouth every 6 (six) hours as needed. 16 tablet 0   ALPRAZolam (XANAX) 1 MG tablet Take 1 tablet (1 mg total) by mouth 2 (two) times daily as needed for anxiety. 60 tablet 0   ammonium lactate (AMLACTIN) 12 % lotion Apply 1 application topically as needed for dry skin. 400 g 0   amoxicillin (AMOXIL) 500 MG capsule TAKE 1 TABLET 3 TIMES PER DAY UNTIL FINISHED 21 capsule 0   Blood Glucose Monitoring Suppl (ACCU-CHEK COMPACT CARE KIT) KIT Check blood sugar twice daily. Dx:E11.9 1 each 0   buPROPion (WELLBUTRIN XL) 300 MG 24 hr tablet Take 1 tablet (300 mg total) by mouth daily.**NEED DIABETIC FOLLOW UP** 30 tablet 11   celecoxib (CELEBREX) 100 MG capsule Take 1 capsule (100 mg total) by mouth 2 (two) times daily. 180 capsule 1    chlorhexidine (PERIOGARD) 0.12 % solution RINSE MOUTH WITH (1 CAPFUL) FOR 30 SECONDS AM AND PM AFTER TOOTHBRUSHING. EXPECTORATE AFTER  RINSING, DO NOT SWALLOW 473 mL 3   chlorhexidine (PERIOGARD) 0.12 % solution RINSE MOUTH WITH (1 CAPFUL) FOR 30 SECONDS AM AND PM AFTER TOOTHBRUSHING. EXPECTORATE AFTER RINSING, DO NOT SWALLOW 473 mL 3   chlorhexidine (PERIOGARD) 0.12 % solution RINSE MOUTH WITH (1 CAPFUL) FOR 30 SECONDS AM AND PM AFTER TOOTHBRUSHING. EXPECTORATE AFTER RINSING, DO NOT SWALLOW 473 mL 0   chlorhexidine (PERIOGARD) 0.12 % solution RINSE MOUTH WITH (1 CAPFUL) FOR 30 SECONDS MORNING AND EVENING AFTER TOOTHBRUSHING. EXPECTORATE AFTER RINSING, DO NOT SWALLOW. 473 mL 2   chlorhexidine (PERIOGARD) 0.12 % solution RINSE MOUTH WITH (1 CAPFUL) FOR 30 SECONDS MORNING AND EVENING AFTER TOOTHBRUSHING. EXPECTORATE AFTER RINSING, DO NOT SWALLOW. 473 mL 4   Continuous Blood Gluc Sensor (FREESTYLE LIBRE 3 SENSOR) MISC Use as directed every 14 days 6 each 3   COVID-19 mRNA bivalent vaccine, Moderna, (MODERNA COVID-19 BIVAL BOOSTER) 50 MCG/0.5ML injection Inject into the muscle. 0.5 mL 0   COVID-19 mRNA vaccine 2023-2024 (COMIRNATY) syringe Inject into the muscle. 0.3 mL 0   COVID-19 mRNA vaccine, Moderna, (MODERNA COVID-19 VACCINE) 100 MCG/0.5ML injection Inject into the muscle. 0.25 mL 0   diclofenac (VOLTAREN) 75 MG EC tablet Take 1 tablet (75 mg total) by mouth 2 (two) times daily. 50 tablet 2   Empagliflozin-metFORMIN HCl (SYNJARDY) 12.06-998 MG TABS Take 1 tablet by mouth 2 (two) times daily. 180 tablet 3   fenofibrate (TRICOR) 145 MG tablet Take 1 tablet (145 mg total) by mouth daily. 90 tablet 1   fluticasone (FLONASE) 50 MCG/ACT nasal spray Place 2 sprays into both nostrils daily. 16 g 1   influenza vac split quadrivalent PF (FLUARIX) 0.5 ML injection Inject into the muscle. 0.5 mL 0   Insulin Glargine (BASAGLAR KWIKPEN) 100 UNIT/ML Inject 70 Units into the skin daily. 75 mL  3   Insulin Pen Needle 29G X 12.7MM MISC Use as directed daily in the afternoon 100 each 3   lisinopril-hydrochlorothiazide (ZESTORETIC) 10-12.5 MG tablet Take 1 tablet by mouth daily. 30 tablet 11   ondansetron (ZOFRAN-ODT) 4 MG disintegrating tablet Take 1 tablet (4 mg total) by mouth every 8 (eight) hours as needed for nausea or vomiting. 20 tablet 0   QUEtiapine (SEROQUEL) 25 MG tablet Take 1 tablet (25 mg total) by mouth at bedtime. 90 tablet 2   rosuvastatin (CRESTOR) 40 MG tablet Take 1 tablet (40 mg total) by mouth daily. 90 tablet 1   Semaglutide, 2 MG/DOSE, (OZEMPIC, 2 MG/DOSE,) 8 MG/3ML SOPN Inject 2 mg into the skin once a week. 9 mL 3   sertraline (ZOLOFT) 100 MG tablet Take 1 tablet (100 mg total) by mouth 2 (two) times daily. 180 tablet 1   sildenafil (VIAGRA) 100 MG tablet Take 0.5-1 tablets (50-100 mg total) by mouth daily as needed for erectile dysfunction. *NEED APPT FOR FURTHER REFILLS* 90 tablet 2   Sod Fluoride-Potassium Nitrate (PREVIDENT 5000 SENSITIVE) 1.1-5 % GEL USE AS DIRECTED ON PACKAGE 100 mL 1   Sodium Fluoride (PREVIDENT 5000 BOOSTER PLUS) 1.1 % PSTE USE A PEA SIZE AMOUNT 2 TIMES PER DAY AS DIRECTED 100 mL 3   Sodium Fluoride (PREVIDENT 5000 BOOSTER PLUS) 1.1 % PSTE USE A PEA SIZE AMOUNT TO BRUSH TEETH 2 TIMES PER DAY 100 mL 3   Sodium Fluoride (PREVIDENT 5000 BOOSTER PLUS) 1.1 % PSTE USE A PEA SIZE AMOUNT 2 TIMES PER DAY TO BRUSH. 100 mL 4   sodium fluoride (PREVIDENT 5000 PLUS) 1.1 %  CREA dental cream apply a pea size amount to tooth brush and brush teeth for 2 minutes preferably at bedtime 51 g 0   Sodium Fluoride 1.1 % PSTE Apply a pea-sized amount of the paste to a toothbrush and brush thoroughly for 2 minutes preferably at bedtime. 100 mL 0   SYRINGE-NEEDLE, DISP, 3 ML (B-D 3CC LUER-LOK SYR 22GX1") 22G X 1" 3 ML MISC Use to inject testosterone 10 each 3   testosterone enanthate (DELATESTRYL) 200 MG/ML injection Inject 0.25 mLs (50 mg total) into the muscle every  14 (fourteen) days. 5 mL 5   traZODone (DESYREL) 100 MG tablet Take 1 tablet (100 mg total) by mouth at bedtime. bridge 35 tablet 0   No current facility-administered medications for this visit.     Musculoskeletal: Strength & Muscle Tone: within normal limits Gait & Station:  Some difficulty in walking because of plantar fasciitis pain Patient leans:  See above  Psychiatric Specialty Exam: Physical Exam  Review of Systems  Blood pressure 129/83, pulse 93, height 6' (1.829 m), weight 204 lb (92.5 kg).There is no height or weight on file to calculate BMI.  General Appearance: Well Groomed  Eye Contact:  Good  Speech:  Clear and Coherent and Normal Rate  Volume:  Normal  Mood:  Euthymic  Affect:  Congruent  Thought Process:  Goal Directed  Orientation:  Full (Time, Place, and Person)  Thought Content:  Logical  Suicidal Thoughts:  No  Homicidal Thoughts:  No  Memory:  Immediate;   Good Recent;   Good Remote;   Good  Judgement:  Intact  Insight:  Good  Psychomotor Activity:  Normal  Concentration:  Concentration: Good and Attention Span: Good  Recall:  Good  Fund of Knowledge:  Good  Language:  Good  Akathisia:  No  Handed:  Right  AIMS (if indicated):     Assets:  Communication Skills Desire for Improvement Housing Resilience Social Support Transportation  ADL's:  Intact  Cognition:  WNL  Sleep:        Screenings:   Equities trader Office Visit from 08/31/2021 in [facility] Office Visit from 07/21/2020 in [facility] Video Visit from 12/06/2019 in [facility] Video Visit from 06/11/2019 in [facility] Visit from 08/13/2018 in [facility] Total Score 6 0 1 1 1   PHQ-9 Total Score 14 -- 1 2 4         Assessment and Plan: PTSD.   Anxiety and depression.  I reviewed blood work results which was done few months ago.  Labs are stable.  Discuss reducing the medication as stress level is not as intense.  Patient agreed with the plan.  He will take the Zoloft 100 mg daily along with Wellbutrin XL 300 mg daily prescribed by PCP.  Recommend to discontinue quetiapine as he not taking on a regular basis.  I explained it is antipsychotic medication and patient believes it causes sometimes bad dreams.  He still like to keep the Xanax which he is taking 1 to 2 mg at bedtime.  I recommend he can cut down 0.5 to take as needed and his long-term plan should become off of the medication.  Continue trazodone 100 mg at bedtime.  Discussed medication side effects and benefits.  Recommend to call us back  if is any question or any concern.  Follow-up in 6 months.  Collaboration of Care: Collaboration of Care: Other provider involved in patient's care AEB notes are available in epic to review.  Patient/Guardian was advised Release of Information must be obtained prior to any record release in order to collaborate their care with an outside provider. Patient/Guardian was advised if they have not already done so to contact the registration department to sign all necessary forms in order for [place] to release information regarding their care.   Consent: Patient/Guardian gives verbal consent for treatment and assignment of benefits for services provided during this visit. Patient/Guardian expressed understanding and agreed to proceed.    Cleotis Nipper, MD 04/25/2022, 3:26 PM

## 2022-04-26 ENCOUNTER — Other Ambulatory Visit (HOSPITAL_BASED_OUTPATIENT_CLINIC_OR_DEPARTMENT_OTHER): Payer: Self-pay

## 2022-05-01 ENCOUNTER — Other Ambulatory Visit: Payer: Self-pay

## 2022-05-01 ENCOUNTER — Encounter: Payer: Self-pay | Admitting: Internal Medicine

## 2022-05-01 ENCOUNTER — Ambulatory Visit: Payer: Federal, State, Local not specified - PPO | Admitting: Internal Medicine

## 2022-05-01 VITALS — BP 124/72 | HR 100 | Ht 72.0 in | Wt 207.0 lb

## 2022-05-01 DIAGNOSIS — Z794 Long term (current) use of insulin: Secondary | ICD-10-CM

## 2022-05-01 DIAGNOSIS — E1165 Type 2 diabetes mellitus with hyperglycemia: Secondary | ICD-10-CM

## 2022-05-01 DIAGNOSIS — E291 Testicular hypofunction: Secondary | ICD-10-CM | POA: Diagnosis not present

## 2022-05-01 LAB — POCT GLYCOSYLATED HEMOGLOBIN (HGB A1C): Hemoglobin A1C: 7.7 % — AB (ref 4.0–5.6)

## 2022-05-01 NOTE — Patient Instructions (Addendum)
Continue Synjardy 12.06-998 mg TWO tablets every morning  Continue Ozempic 2 mg weekly  Decease Basaglar 55 units once daily    Please have labs at [place] this Friday    HOW TO TREAT LOW BLOOD SUGARS (Blood sugar LESS THAN 70 MG/DL) Please follow the RULE OF 15 for the treatment of hypoglycemia treatment (when your (blood sugars are less than 70 mg/dL)   STEP 1: Take 15 grams of carbohydrates when your blood sugar is low, which includes:  3-4 GLUCOSE TABS  OR 3-4 OZ OF JUICE OR REGULAR SODA OR ONE TUBE OF GLUCOSE GEL    STEP 2: RECHECK blood sugar in 15 MINUTES STEP 3: If your blood sugar is still low at the 15 minute recheck --> then, go back to STEP 1 and treat AGAIN with another 15 grams of carbohydrates.

## 2022-05-01 NOTE — Progress Notes (Unsigned)
Name: William [name]  MRN/ DOB: [id number], 1960-03-17   Age/ Sex: 62 y.o., male    PCP: Shelda Pal, DO   Reason for Endocrinology Evaluation: Type 2 Diabetes Mellitus     Date of Initial Endocrinology Visit: 06/18/2021    PATIENT IDENTIFIER: William [name] is a 62 y.o. male with a past medical history of HTN, T2DM, hypogonadism. The patient presented for initial endocrinology clinic visit on 06/18/2021 for consultative assistance with his diabetes management.    HPI: William [name] was    Diagnosed with DM 2000 Prior Medications tried/Intolerance:  Hemoglobin A1c has ranged from 6.5% in 2020, peaking at 9.9% in 2022.     Has left plantar faciitis, follows with podiatry    TESTOSTERONE HISTORY: He has been noted with low testosterone since 2013, with a nadir of 123 NG/DL in 2015. He stopped taking testosterone ~ 2 months ago because it did not help with energy help.   SUBJECTIVE:   During the last visit (10/29/2021): A1c 7.7%   Today (05/01/22): William [name] is here for a follow up on diabetes and hypogonadism management. He    checks his blood sugars multiple  times daily through freestyle libre . The patient has had hypoglycemic episodes since the last clinic visit. He is symptomatic with these episodes   The patient has been evaluated by urology for an elevated PSA while on testosterone therapy.  He sees Dr. Michaelle Birks 01/09/2022, the risk of prostate cancer in the setting of testosterone therapy was explained to the patient, the patient expressed no interest in treating his lower urinary symptoms, no prostate nodules were detected  He continues to follow-up with behavioral health for anxiety and depression Follows with podiatry, last visit 12/21/2021 for plantar fasciitis  He stopped Synjardy for 3 days because he is trying to test some medications out  and how they make him feel ?   He retired 1/26th ,2024  Last testosterone dose was Monday  Urine  frequency is improving  He is a vegetarian    HOME ENDOCRINE  REGIMEN: Synjardy 12.06/998 mg 2 tabs daily Basaglar 65 units QAM - takes 60 units  Ozempic 2 mg weekly Testosterone enanthate 200 mg/ML 50 mg Q 14 days ( Sunday)    Statin: Yes ACE-I/ARB: yes     CONTINUOUS GLUCOSE MONITORING RECORD INTERPRETATION    Dates of Recording:9/12-9/25/2023  Sensor description:freestyle libre   Results statistics:   CGM use % of time 96  Average and SD 152/30.7  Time in range 70   %  % Time Above 180 23  % Time above 250 3  % Time Below target 3       Glycemic patterns summary:BG's trending down during the day and optimal overnight   Hyperglycemic episodes  postprandial   Hypoglycemic episodes occurred during the day   Overnight periods: trends down      DIABETIC COMPLICATIONS: Microvascular complications:  Nonproliferative DR Denies: CKD, neuropathy  Last eye exam: Completed 02/2020  Macrovascular complications:   Denies: CAD, PVD, CVA   PAST HISTORY: Past Medical History:  Past Medical History:  Diagnosis Date   Adjustment disorder with mixed anxiety and depressed mood    Arthritis    Asthma    history   Diabetes mellitus    type 2   Elevated blood pressure    history of high blood pressure readings   History of chicken pox    Hypercholesteremia    Hypertension    Internal  hemorrhoids    Tubular adenoma of colon    Past Surgical History:  Past Surgical History:  Procedure Laterality Date   ELBOW SURGERY     right elbow 2010, left elbow 08/06/10 [name])   KNEE SURGERY     right knee new ACL- [phone]   KNEE SURGERY     left knee 2003   NASAL SEPTUM SURGERY     1992   PARTIAL HIP ARTHROPLASTY     right hip replacement    Social History:  reports that he has never smoked. He has never used smokeless tobacco. He reports current alcohol use of about 7.0 - 14.0 standard drinks of alcohol per week. He reports that he does not use  drugs. Family History:  Family History  Problem Relation Age of Onset   Diabetes Mother    Thyroid disease Mother        Questionable   Heart disease Father        deceased   Other Neg Hx        hypogonadism   Colon cancer Neg Hx      HOME MEDICATIONS: Allergies as of 05/01/2022   No Known Allergies      Medication List        Accurate as of May 01, 2022 11:45 AM. If you have any questions, ask your nurse or doctor.          ACCU-CHEK COMPACT CARE KIT Kit Check blood sugar twice daily. Dx:E11.9   acetaminophen-codeine 300-30 MG tablet Commonly known as: TYLENOL #3 Take 1 tablet by mouth every 6 (six) hours as needed.   ALPRAZolam 1 MG tablet Commonly known as: XANAX Take 1 tablet (1 mg total) by mouth 2 (two) times daily as needed for anxiety.   ammonium lactate 12 % lotion Commonly known as: AmLactin Apply 1 application topically as needed for dry skin.   amoxicillin 500 MG capsule Commonly known as: AMOXIL TAKE 1 TABLET 3 TIMES PER DAY UNTIL FINISHED   B-D 3CC LUER-LOK SYR 22GX1" 22G X 1" 3 ML Misc Generic drug: SYRINGE-NEEDLE (DISP) 3 ML Use to inject testosterone   Basaglar KwikPen 100 UNIT/ML Inject 70 Units into the skin daily.   buPROPion 300 MG 24 hr tablet Commonly known as: WELLBUTRIN XL Take 1 tablet (300 mg total) by mouth daily.**NEED DIABETIC FOLLOW UP**   celecoxib 100 MG capsule Commonly known as: CELEBREX Take 1 capsule (100 mg total) by mouth 2 (two) times daily.   chlorhexidine 0.12 % solution Commonly known as: Periogard RINSE MOUTH WITH 15ML (1 CAPFUL) FOR 30 SECONDS AM AND PM AFTER TOOTHBRUSHING. EXPECTORATE AFTER RINSING, DO NOT SWALLOW   chlorhexidine 0.12 % solution Commonly known as: Periogard RINSE MOUTH WITH 15ML (1 CAPFUL) FOR 30 SECONDS AM AND PM AFTER TOOTHBRUSHING. EXPECTORATE AFTER RINSING, DO NOT SWALLOW   chlorhexidine 0.12 % solution Commonly known as: Periogard RINSE MOUTH WITH 15ML (1 CAPFUL) FOR 30  SECONDS AM AND PM AFTER TOOTHBRUSHING. EXPECTORATE AFTER RINSING, DO NOT SWALLOW   chlorhexidine 0.12 % solution Commonly known as: Periogard RINSE MOUTH WITH 15ML (1 CAPFUL) FOR 30 SECONDS MORNING AND EVENING AFTER TOOTHBRUSHING. EXPECTORATE AFTER RINSING, DO NOT SWALLOW.   chlorhexidine 0.12 % solution Commonly known as: Periogard RINSE MOUTH WITH 15ML (1 CAPFUL) FOR 30 SECONDS MORNING AND EVENING AFTER TOOTHBRUSHING. EXPECTORATE AFTER RINSING, DO NOT SWALLOW.   diclofenac 75 MG EC tablet Commonly known as: VOLTAREN Take 1 tablet (75 mg total) by mouth 2 (two) times daily.  fenofibrate 145 MG tablet Commonly known as: TRICOR Take 1 tablet (145 mg total) by mouth daily.   Fluarix Quadrivalent 0.5 ML injection Generic drug: influenza vac split quadrivalent PF Inject into the muscle.   fluticasone 50 MCG/ACT nasal spray Commonly known as: FLONASE Place 2 sprays into both nostrils daily.   FreeStyle Libre 3 Sensor Misc Use as directed every 14 days   Insulin Pen Needle 29G X 12.7MM Misc Use as directed daily in the afternoon   lisinopril-hydrochlorothiazide 10-12.5 MG tablet Commonly known as: ZESTORETIC Take 1 tablet by mouth daily.   Moderna COVID-19 Bival Booster 50 MCG/0.5ML injection Generic drug: COVID-19 mRNA bivalent vaccine (Moderna) Inject into the muscle.   Moderna COVID-19 Vaccine 100 MCG/0.5ML injection Generic drug: COVID-19 mRNA vaccine (Moderna) Inject into the muscle.   Comirnaty syringe Generic drug: COVID-19 mRNA vaccine 2023-2024 Inject into the muscle.   ondansetron 4 MG disintegrating tablet Commonly known as: ZOFRAN-ODT Take 1 tablet (4 mg total) by mouth every 8 (eight) hours as needed for nausea or vomiting.   Ozempic (2 MG/DOSE) 8 MG/3ML Sopn Generic drug: Semaglutide (2 MG/DOSE) Inject 2 mg into the skin once a week.   PreviDent 5000 Sensitive 1.1-5 % Gel Generic drug: Sod Fluoride-Potassium Nitrate USE AS DIRECTED ON PACKAGE    QUEtiapine 25 MG tablet Commonly known as: SEROQUEL Take 1 tablet (25 mg total) by mouth at bedtime.   rosuvastatin 40 MG tablet Commonly known as: CRESTOR Take 1 tablet (40 mg total) by mouth daily.   sertraline 100 MG tablet Commonly known as: ZOLOFT Take 1 tablet (100 mg total) by mouth daily.   sildenafil 100 MG tablet Commonly known as: VIAGRA Take 0.5-1 tablets (50-100 mg total) by mouth daily as needed for erectile dysfunction. *NEED APPT FOR FURTHER REFILLS*   Sodium Fluoride 5000 Plus 1.1 % Crea dental cream Generic drug: sodium fluoride apply a pea size amount to tooth brush and brush teeth for 2 minutes preferably at bedtime   Sodium Fluoride 5000 PPM 1.1 % Pste Generic drug: Sodium Fluoride Apply a pea-sized amount of the paste to a toothbrush and brush thoroughly for 2 minutes preferably at bedtime.   Sodium Fluoride 5000 PPM 1.1 % Pste Generic drug: Sodium Fluoride USE A PEA SIZE AMOUNT 2 TIMES PER DAY AS DIRECTED   Sodium Fluoride 5000 PPM 1.1 % Pste Generic drug: Sodium Fluoride USE A PEA SIZE AMOUNT TO BRUSH TEETH 2 TIMES PER DAY   Sodium Fluoride 5000 PPM 1.1 % Pste Generic drug: Sodium Fluoride USE A PEA SIZE AMOUNT 2 TIMES PER DAY TO BRUSH.   Synjardy 12.06-998 MG Tabs Generic drug: Empagliflozin-metFORMIN HCl Take 1 tablet by mouth 2 (two) times daily.   testosterone enanthate 200 MG/ML injection Commonly known as: DELATESTRYL Inject 0.25 mLs (50 mg total) into the muscle every 14 (fourteen) days.   traZODone 100 MG tablet Commonly known as: DESYREL Take 1 tablet (100 mg total) by mouth at bedtime. bridge         ALLERGIES: No Known Allergies   REVIEW OF SYSTEMS: A comprehensive ROS was conducted with the patient and is negative except as per HPI     OBJECTIVE:   VITAL SIGNS: BP 124/72 (BP Location: Left Arm, Patient Position: Sitting, Cuff Size: Large)   Pulse 100   Ht 6' (1.829 m)   Wt 207 lb (93.9 kg)   SpO2 98%   BMI 28.07  kg/m    PHYSICAL EXAM:  General: Pt appears well and is  in NAD  Lungs: Clear with good BS bilat with no rales, rhonchi, or wheezes  Heart: RRR   Extremities:  Lower extremities - No pretibial edema.  Neuro: MS is good with appropriate affect, pt is alert and Ox3    DM foot exam:12/21/2021 Per podiatry   The skin of the feet is intact without sores or ulcerations. The pedal pulses are 2+ on right and 2+ on left. The sensation is intact to a screening 5.07, 10 gram monofilament bilaterally   DATA REVIEWED:  Lab Results  Component Value Date   HGBA1C 7.7 (A) 05/01/2022   HGBA1C 7.7 (A) 10/29/2021   HGBA1C 7.7 (A) 06/18/2021   Lab Results  Component Value Date   MICROALBUR <0.7 06/09/2019   LDLCALC 154 (H) 12/09/2016   CREATININE 1.11 10/30/2021   Lab Results  Component Value Date   MICRALBCREAT 1.4 06/09/2019    Lab Results  Component Value Date   CHOL 123 01/04/2021   HDL 24.00 (L) 01/04/2021   LDLCALC 154 (H) 12/09/2016   LDLDIRECT 58.0 01/04/2021   TRIG (H) 01/04/2021    500.0 Triglyceride is over 400; calculations on Lipids are invalid.   CHOLHDL 5 01/04/2021        ASSESSMENT / PLAN / RECOMMENDATIONS:   1) Type 2 Diabetes Mellitus, Sub-Optimally controlled, With retinopathic complications - Most recent A1c of 7.7  %. Goal A1c <7.0%.     -A1c remains stable and above goal -Patient has been noted with hypoglycemia as well as postprandial hyperglycemia, patient is a vegetarian -Patient also admits to imperfect adherence to medications, he tends to forget the second dose of Synjardy, I have advised him again to take the 2 tablets of Synjardy together every morning -He also forgets to take basal insulin 30% of the time -I will reduce basal insulin as below -We did gust that if he continues with postprandial hyperglycemia we will have to discuss add-on therapy versus prandial insulin  MEDICATIONS: Change Synjardy 12.06-998 mg TWO Tabs QAM  Continue Ozempic 2  mg weekly  Decrease Basaglar 65 units daily   EDUCATION / INSTRUCTIONS: BG monitoring instructions: Patient is instructed to check his blood sugars 3 times a day, before meals . Call [facility] if: BG persistently < 70  I reviewed the Rule of 15 for the treatment of hypoglycemia in detail with the patient. Literature supplied.   2) Diabetic complications:  Eye: Does  have known diabetic retinopathy.  Neuro/ Feet: Does not have known diabetic peripheral neuropathy. Renal: Patient does not have known baseline CKD. He is not on an ACEI/ARB at present  3) Hypogonadism:  -His energy level has been stable -I have prescribed 50 mg of testosterone every 14 days,, the has been taking this every week -We will recheck testosterone, he will have his labs done on Wednesday (last dose of testosterone was yesterday and is too soon to check) -He will have this at [facility]  Medication  Testosterone 200 mg/ML, 50 mg (0.25 mL) weekly  Follow-up in 4 months      Signed electronically by: Mack Guise, MD  [facility]  [address] [place], [place] [address] Phone: [phone] FAX: [phone]   CC: Shelda Pal, [address] [place] [address] Phone: [phone]  Fax: [phone]    Return to Endocrinology clinic as below: Future Appointments  Date Time Provider [place]  10/31/2022  3:40 PM  Arfeen, Arlyce Harman, MD [facility] None

## 2022-05-02 ENCOUNTER — Encounter: Payer: Self-pay | Admitting: Internal Medicine

## 2022-05-10 DIAGNOSIS — H1132 Conjunctival hemorrhage, left eye: Secondary | ICD-10-CM | POA: Diagnosis not present

## 2022-05-13 DIAGNOSIS — H2513 Age-related nuclear cataract, bilateral: Secondary | ICD-10-CM | POA: Diagnosis not present

## 2022-05-13 DIAGNOSIS — H40013 Open angle with borderline findings, low risk, bilateral: Secondary | ICD-10-CM | POA: Diagnosis not present

## 2022-05-13 DIAGNOSIS — E119 Type 2 diabetes mellitus without complications: Secondary | ICD-10-CM | POA: Diagnosis not present

## 2022-05-20 ENCOUNTER — Encounter: Payer: Self-pay | Admitting: *Deleted

## 2022-05-27 ENCOUNTER — Ambulatory Visit: Payer: Federal, State, Local not specified - PPO | Admitting: Podiatry

## 2022-05-27 ENCOUNTER — Encounter: Payer: Self-pay | Admitting: Podiatry

## 2022-05-27 ENCOUNTER — Other Ambulatory Visit (HOSPITAL_BASED_OUTPATIENT_CLINIC_OR_DEPARTMENT_OTHER): Payer: Self-pay

## 2022-05-27 DIAGNOSIS — M722 Plantar fascial fibromatosis: Secondary | ICD-10-CM | POA: Diagnosis not present

## 2022-05-27 MED ORDER — TRIAMCINOLONE ACETONIDE 10 MG/ML IJ SUSP
20.0000 mg | Freq: Once | INTRAMUSCULAR | Status: AC
  Administered 2022-05-27: 20 mg

## 2022-05-27 MED ORDER — DICLOFENAC SODIUM 75 MG PO TBEC
75.0000 mg | DELAYED_RELEASE_TABLET | Freq: Two times a day (BID) | ORAL | 2 refills | Status: DC
  Filled 2022-05-27: qty 50, 25d supply, fill #0

## 2022-05-28 NOTE — Progress Notes (Signed)
Subjective:   Patient ID: William [name], male   DOB: 62 y.o.   MRN: [id number]   HPI Patient states his heels have started to bother him again and states that it is hurting on the inside and that he is getting ready to go on a cruise   ROS      Objective:  Physical Exam  Neurovascular status intact with inflammation of the medial fascial band bilateral with fluid buildup with relatively chronic pain in his feet especially at night     Assessment:  Acute fasciitis bilateral with also the possibility there may be low-grade neuropathy or just chronic inflammation of feet in general     Plan:  H&P reviewed and I went ahead today did sterile prep and injected the medial band of the fascia bilateral 3 mg Kenalog 5 mg Xylocaine and instructed on continued insert usage support shoes.  Also wrote him a prescription for diclofenac to try to reduce the acute inflammation

## 2022-06-03 ENCOUNTER — Ambulatory Visit (HOSPITAL_BASED_OUTPATIENT_CLINIC_OR_DEPARTMENT_OTHER)
Admission: RE | Admit: 2022-06-03 | Discharge: 2022-06-03 | Disposition: A | Discharge status: Home / Self Care | Payer: Federal, State, Local not specified - PPO | Source: Ambulatory Visit | Attending: Family | Admitting: Family

## 2022-06-03 ENCOUNTER — Ambulatory Visit: Payer: Federal, State, Local not specified - PPO | Admitting: Family

## 2022-06-03 ENCOUNTER — Other Ambulatory Visit: Payer: Self-pay | Admitting: Internal Medicine

## 2022-06-03 ENCOUNTER — Other Ambulatory Visit: Payer: Self-pay | Admitting: Family Medicine

## 2022-06-03 ENCOUNTER — Telehealth: Payer: Self-pay | Admitting: *Deleted

## 2022-06-03 ENCOUNTER — Other Ambulatory Visit (HOSPITAL_BASED_OUTPATIENT_CLINIC_OR_DEPARTMENT_OTHER): Payer: Self-pay

## 2022-06-03 VITALS — BP 135/90 | HR 92 | Temp 97.4°F | Resp 16 | Wt 208.0 lb

## 2022-06-03 DIAGNOSIS — I10 Essential (primary) hypertension: Secondary | ICD-10-CM | POA: Diagnosis not present

## 2022-06-03 DIAGNOSIS — S0990XA Unspecified injury of head, initial encounter: Secondary | ICD-10-CM

## 2022-06-03 DIAGNOSIS — R220 Localized swelling, mass and lump, head: Secondary | ICD-10-CM | POA: Diagnosis not present

## 2022-06-03 NOTE — Telephone Encounter (Signed)
Requesting: alprazolam 1mg   Contract: None UDS: None Last Visit: 10/12/21 Next Visit: None Last Refill: 03/11/22 #60 and 0RF   Please Advise
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
## 2022-06-03 NOTE — Assessment & Plan Note (Addendum)
BP was elevated today initially. Follow up bp is better.   BP Readings from Last 3 Encounters:  06/03/22 (!) 135/90  05/01/22 124/72  01/09/22 131/77

## 2022-06-03 NOTE — Telephone Encounter (Signed)
Last OV--10/12/2021 Last RF--#60 with 0 refills on 03/11/22.

## 2022-06-03 NOTE — Progress Notes (Signed)
Subjective:   By signing my name below, I, William [name], attest that this documentation has been prepared under the direction and in the presence of [name], NP. 06/03/2022   Patient ID: William [name], male    DOB: Nov 12, 1960, 62 y.o.   MRN: [id number]  Chief Complaint  Patient presents with   Fall    Patient reports falling yesterday and hitting the back of his head    Fall   Patient is in today for a office visit.   He reports getting up yesterday at 3:45 am and losing balance after getting up and hitting the back of his head on his dresser. He notes seeing dots following the fall but regained full vision shortly after. He thinks he may have a mild concussion. He denies losing consciousness before, during or after the fall. He developed a bump on the back of head that was hit during the fall.    Past Medical History:  Diagnosis Date   Adjustment disorder with mixed anxiety and depressed mood    Arthritis    Asthma    history   Diabetes mellitus    type 2   Elevated blood pressure    history of high blood pressure readings   History of chicken pox    Hypercholesteremia    Hypertension    Internal hemorrhoids    Tubular adenoma of colon     Past Surgical History:  Procedure Laterality Date   ELBOW SURGERY     right elbow 2010, left elbow 08/06/10 Wendall Mola)   KNEE SURGERY     right knee new ACL- [phone]   KNEE SURGERY     left knee 2003   NASAL SEPTUM SURGERY     1992   PARTIAL HIP ARTHROPLASTY     right hip replacement    Family History  Problem Relation Age of Onset   Diabetes Mother    Thyroid disease Mother        Questionable   Heart disease Father        deceased   Other Neg Hx        hypogonadism   Colon cancer Neg Hx     Social History   Socioeconomic History   Marital status: Married    Spouse name: Not on file   Number of children: Not on file   Years of education: Not on file   Highest education level: Not on file   Occupational History   Not on file  Tobacco Use   Smoking status: Never   Smokeless tobacco: Never  Substance and Sexual Activity   Alcohol use: Yes    Alcohol/week: 7.0 - 14.0 standard drinks of alcohol    Types: 7 - 14 Glasses of wine per week   Drug use: No   Sexual activity: Not on file  Other Topics Concern   Not on file  Social History Narrative   Occupation: Real Therapist, occupational   Married -15 marriage (2nd marriage)   daughter 62,  2 sons (2nd marriage  11,6)   [place]   Never Smoked    Alcohol use-yes   Drug use-no    Regular exercise-no   Smoking Status:  never   Does Patient Exercise:  no   Caffeine use/day:  3-4 cups coffee daily   Drug Use:  no         Social Determinants of Corporate investment banker Strain: Not on file  Food Insecurity: Not on file  Transportation Needs: Not on file  Physical Activity: Not on file  Stress: Not on file  Social Connections: Not on file  Intimate Partner Violence: Not on file    Outpatient Medications Prior to Visit  Medication Sig Dispense Refill   acetaminophen-codeine (TYLENOL #3) 300-30 MG tablet Take 1 tablet by mouth every 6 (six) hours as needed. 16 tablet 0   ALPRAZolam (XANAX) 1 MG tablet Take 1 tablet (1 mg total) by mouth 2 (two) times daily as needed for anxiety. 60 tablet 0   Blood Glucose Monitoring Suppl (ACCU-CHEK COMPACT CARE KIT) KIT Check blood sugar twice daily. Dx:E11.9 1 each 0   celecoxib (CELEBREX) 100 MG capsule Take 1 capsule (100 mg total) by mouth 2 (two) times daily. 180 capsule 1   chlorhexidine (PERIOGARD) 0.12 % solution RINSE MOUTH WITH (1 CAPFUL) FOR 30 SECONDS AM AND PM AFTER TOOTHBRUSHING. EXPECTORATE AFTER RINSING, DO NOT SWALLOW 473 mL 3   Continuous Blood Gluc Sensor (FREESTYLE LIBRE 3 SENSOR) MISC Use as directed every 14 days 6 each 3   COVID-19 mRNA vaccine, Moderna, (MODERNA COVID-19 VACCINE) 100 MCG/0.5ML injection Inject into the muscle. 0.25 mL 0   diclofenac (VOLTAREN) 75 MG  EC tablet Take 1 tablet (75 mg total) by mouth 2 (two) times daily. 50 tablet 2   Empagliflozin-metFORMIN HCl (SYNJARDY) 12.06-998 MG TABS Take 1 tablet by mouth 2 (two) times daily. 180 tablet 3   fluticasone (FLONASE) 50 MCG/ACT nasal spray Place 2 sprays into both nostrils daily. 16 g 1   Insulin Glargine (BASAGLAR KWIKPEN) 100 UNIT/ML Inject 70 Units into the skin daily. 75 mL 3   Insulin Pen Needle 29G X 12.7MM MISC Use as directed daily in the afternoon 100 each 3   ondansetron (ZOFRAN-ODT) 4 MG disintegrating tablet Take 1 tablet (4 mg total) by mouth every 8 (eight) hours as needed for nausea or vomiting. 20 tablet 0   rosuvastatin (CRESTOR) 40 MG tablet Take 1 tablet (40 mg total) by mouth daily. 90 tablet 1   Semaglutide, 2 MG/DOSE, (OZEMPIC, 2 MG/DOSE,) 8 MG/3ML SOPN Inject 2 mg into the skin once a week. 9 mL 3   sertraline (ZOLOFT) 100 MG tablet Take 1 tablet (100 mg total) by mouth daily. 90 tablet 1   sildenafil (VIAGRA) 100 MG tablet Take 0.5-1 tablets (50-100 mg total) by mouth daily as needed for erectile dysfunction. *NEED APPT FOR FURTHER REFILLS* 90 tablet 2   Sod Fluoride-Potassium Nitrate (PREVIDENT 5000 SENSITIVE) 1.1-5 % GEL USE AS DIRECTED ON PACKAGE 100 mL 1   Sodium Fluoride 1.1 % PSTE Apply a pea-sized amount of the paste to a toothbrush and brush thoroughly for 2 minutes preferably at bedtime. 100 mL 0   SYRINGE-NEEDLE, DISP, 3 ML (B-D 3CC LUER-LOK SYR 22GX1") 22G X 1" 3 ML MISC Use to inject testosterone 10 each 3   testosterone enanthate (DELATESTRYL) 200 MG/ML injection Inject 0.25 mLs (50 mg total) into the muscle every 14 (fourteen) days. 5 mL 5   ammonium lactate (AMLACTIN) 12 % lotion Apply 1 application topically as needed for dry skin. 400 g 0   amoxicillin (AMOXIL) 500 MG capsule TAKE 1 TABLET 3 TIMES PER DAY UNTIL FINISHED 21 capsule 0   buPROPion (WELLBUTRIN XL) 300 MG 24 hr tablet Take 1 tablet (300 mg total) by mouth daily.**NEED DIABETIC FOLLOW UP** 30  tablet 11   chlorhexidine (PERIOGARD) 0.12 % solution RINSE MOUTH WITH (1 CAPFUL) FOR 30 SECONDS AM AND PM AFTER  TOOTHBRUSHING. EXPECTORATE AFTER RINSING, DO NOT SWALLOW 473 mL 3   chlorhexidine (PERIOGARD) 0.12 % solution RINSE MOUTH WITH (1 CAPFUL) FOR 30 SECONDS AM AND PM AFTER TOOTHBRUSHING. EXPECTORATE AFTER RINSING, DO NOT SWALLOW 473 mL 0   chlorhexidine (PERIOGARD) 0.12 % solution RINSE MOUTH WITH (1 CAPFUL) FOR 30 SECONDS MORNING AND EVENING AFTER TOOTHBRUSHING. EXPECTORATE AFTER RINSING, DO NOT SWALLOW. 473 mL 2   chlorhexidine (PERIOGARD) 0.12 % solution RINSE MOUTH WITH (1 CAPFUL) FOR 30 SECONDS MORNING AND EVENING AFTER TOOTHBRUSHING. EXPECTORATE AFTER RINSING, DO NOT SWALLOW. 473 mL 4   COVID-19 mRNA bivalent vaccine, Moderna, (MODERNA COVID-19 BIVAL BOOSTER) 50 MCG/0.5ML injection Inject into the muscle. 0.5 mL 0   COVID-19 mRNA vaccine 2023-2024 (COMIRNATY) syringe Inject into the muscle. 0.3 mL 0   diclofenac (VOLTAREN) 75 MG EC tablet Take 1 tablet (75 mg total) by mouth 2 (two) times daily. 50 tablet 2   fenofibrate (TRICOR) 145 MG tablet Take 1 tablet (145 mg total) by mouth daily. 90 tablet 1   influenza vac split quadrivalent PF (FLUARIX) 0.5 ML injection Inject into the muscle. 0.5 mL 0   QUEtiapine (SEROQUEL) 25 MG tablet Take 1 tablet (25 mg total) by mouth at bedtime. 90 tablet 2   Sodium Fluoride (PREVIDENT 5000 BOOSTER PLUS) 1.1 % PSTE USE A PEA SIZE AMOUNT 2 TIMES PER DAY AS DIRECTED 100 mL 3   Sodium Fluoride (PREVIDENT 5000 BOOSTER PLUS) 1.1 % PSTE USE A PEA SIZE AMOUNT TO BRUSH TEETH 2 TIMES PER DAY 100 mL 3   Sodium Fluoride (PREVIDENT 5000 BOOSTER PLUS) 1.1 % PSTE USE A PEA SIZE AMOUNT 2 TIMES PER DAY TO BRUSH. 100 mL 4   sodium fluoride (PREVIDENT 5000 PLUS) 1.1 % CREA dental cream apply a pea size amount to tooth brush and brush teeth for 2 minutes preferably at bedtime 51 g 0   lisinopril-hydrochlorothiazide (ZESTORETIC) 10-12.5 MG tablet Take 1  tablet by mouth daily. (Patient not taking: Reported on 06/03/2022) 30 tablet 11   traZODone (DESYREL) 100 MG tablet Take 1 tablet (100 mg total) by mouth at bedtime. bridge (Patient not taking: Reported on 06/03/2022) 90 tablet 0   No facility-administered medications prior to visit.    No Known Allergies  Review of Systems  Musculoskeletal:  Positive for falls (fall hitting back of head).       Objective:    Physical Exam Constitutional:      General: He is not in acute distress.    Appearance: Normal appearance. He is not ill-appearing.  HENT:     Head: Normocephalic and atraumatic.     Right Ear: External ear normal.     Left Ear: External ear normal.  Eyes:     Extraocular Movements: Extraocular movements intact.     Pupils: Pupils are equal, round, and reactive to light.  Cardiovascular:     Rate and Rhythm: Normal rate and regular rhythm.     Heart sounds: Normal heart sounds. No murmur heard.    No gallop.  Pulmonary:     Effort: Pulmonary effort is normal. No respiratory distress.     Breath sounds: Normal breath sounds. No wheezing or rales.  Musculoskeletal:     Comments: Hematoma left occipital scalp  Skin:    General: Skin is warm and dry.  Neurological:     Mental Status: He is alert and oriented to person, place, and time.     Comments: Cranial nerves are intact  Psychiatric:  Judgment: Judgment normal.     BP (!) 148/96 (BP Location: Right Arm, Patient Position: Sitting, Cuff Size: Small)   Pulse 92   Temp (!) 97.4 F (36.3 C) (Oral)   Resp 16   Wt 208 lb (94.3 kg)   SpO2 98%   BMI 28.21 kg/m  Wt Readings from Last 3 Encounters:  06/03/22 208 lb (94.3 kg)  05/01/22 207 lb (93.9 kg)  01/09/22 211 lb (95.7 kg)       Assessment & Plan:  Injury of head, initial encounter Assessment & Plan: New.  Will obtain CT head to rule out bleed.  I do suspect that he suffered a concussion and we discussed common symptoms following discussion. He is  advised that if severe/worsening symptoms to go to the ER.  If symptoms do not improve he will let me know and we can refer him to the [facility].   Orders: -     CT HEAD WO CONTRAST ( ); Future    I, Lemont Fillers, NP, personally preformed the services described in this documentation.  All medical record entries made by the scribe were at my direction and in my presence.  I have reviewed the chart and discharge instructions (if applicable) and agree that the record reflects my personal performance and is accurate and complete. 06/03/2022   I,William [name],acting as a Neurosurgeon for Lemont Fillers, NP.,have documented all relevant documentation on the behalf of Lemont Fillers, NP,as directed by  Lemont Fillers, NP while in the presence of Lemont Fillers, NP.   Lemont Fillers, NP

## 2022-06-04 ENCOUNTER — Other Ambulatory Visit (HOSPITAL_BASED_OUTPATIENT_CLINIC_OR_DEPARTMENT_OTHER): Payer: Self-pay

## 2022-06-04 MED ORDER — TESTOSTERONE ENANTHATE 200 MG/ML IM SOLN
50.0000 mg | INTRAMUSCULAR | 5 refills | Status: DC
  Filled 2022-06-04: qty 5, 28d supply, fill #0
  Filled 2022-10-04: qty 5, 28d supply, fill #1
  Filled 2022-10-28: qty 5, 28d supply, fill #2
  Filled ????-??-??: fill #2

## 2022-06-05 ENCOUNTER — Other Ambulatory Visit (HOSPITAL_BASED_OUTPATIENT_CLINIC_OR_DEPARTMENT_OTHER): Payer: Self-pay

## 2022-06-05 ENCOUNTER — Encounter (HOSPITAL_BASED_OUTPATIENT_CLINIC_OR_DEPARTMENT_OTHER): Payer: Self-pay

## 2022-06-06 ENCOUNTER — Other Ambulatory Visit (HOSPITAL_BASED_OUTPATIENT_CLINIC_OR_DEPARTMENT_OTHER): Payer: Self-pay

## 2022-06-10 ENCOUNTER — Other Ambulatory Visit (HOSPITAL_BASED_OUTPATIENT_CLINIC_OR_DEPARTMENT_OTHER): Payer: Self-pay

## 2022-06-11 ENCOUNTER — Other Ambulatory Visit (HOSPITAL_BASED_OUTPATIENT_CLINIC_OR_DEPARTMENT_OTHER): Payer: Self-pay

## 2022-06-12 ENCOUNTER — Other Ambulatory Visit (HOSPITAL_BASED_OUTPATIENT_CLINIC_OR_DEPARTMENT_OTHER): Payer: Self-pay

## 2022-06-14 ENCOUNTER — Other Ambulatory Visit (HOSPITAL_BASED_OUTPATIENT_CLINIC_OR_DEPARTMENT_OTHER): Payer: Self-pay

## 2022-06-17 ENCOUNTER — Other Ambulatory Visit (HOSPITAL_BASED_OUTPATIENT_CLINIC_OR_DEPARTMENT_OTHER): Payer: Self-pay

## 2022-06-25 ENCOUNTER — Other Ambulatory Visit (HOSPITAL_BASED_OUTPATIENT_CLINIC_OR_DEPARTMENT_OTHER): Payer: Self-pay

## 2022-06-25 DIAGNOSIS — M5415 Radiculopathy, thoracolumbar region: Secondary | ICD-10-CM | POA: Diagnosis not present

## 2022-06-25 DIAGNOSIS — M9902 Segmental and somatic dysfunction of thoracic region: Secondary | ICD-10-CM | POA: Diagnosis not present

## 2022-06-25 DIAGNOSIS — M546 Pain in thoracic spine: Secondary | ICD-10-CM | POA: Diagnosis not present

## 2022-06-25 DIAGNOSIS — M9903 Segmental and somatic dysfunction of lumbar region: Secondary | ICD-10-CM | POA: Diagnosis not present

## 2022-06-26 ENCOUNTER — Other Ambulatory Visit (HOSPITAL_BASED_OUTPATIENT_CLINIC_OR_DEPARTMENT_OTHER): Payer: Self-pay

## 2022-06-26 ENCOUNTER — Telehealth (INDEPENDENT_AMBULATORY_CARE_PROVIDER_SITE_OTHER): Payer: Federal, State, Local not specified - PPO | Admitting: Family Medicine

## 2022-06-26 ENCOUNTER — Encounter: Payer: Self-pay | Admitting: Family Medicine

## 2022-06-26 ENCOUNTER — Ambulatory Visit: Payer: Federal, State, Local not specified - PPO | Admitting: Family Medicine

## 2022-06-26 ENCOUNTER — Ambulatory Visit (INDEPENDENT_AMBULATORY_CARE_PROVIDER_SITE_OTHER): Payer: Federal, State, Local not specified - PPO

## 2022-06-26 VITALS — BP 142/84 | HR 125 | Temp 98.6°F | Ht 72.0 in | Wt 208.0 lb

## 2022-06-26 DIAGNOSIS — R051 Acute cough: Secondary | ICD-10-CM

## 2022-06-26 DIAGNOSIS — U071 COVID-19: Secondary | ICD-10-CM | POA: Diagnosis not present

## 2022-06-26 LAB — POC COVID19 BINAXNOW: SARS Coronavirus 2 Ag: POSITIVE — AB

## 2022-06-26 MED ORDER — PREDNISONE 20 MG PO TABS
40.0000 mg | ORAL_TABLET | Freq: Every day | ORAL | 0 refills | Status: AC
  Filled 2022-06-26: qty 10, 5d supply, fill #0

## 2022-06-26 MED ORDER — NIRMATRELVIR/RITONAVIR (PAXLOVID)TABLET
3.0000 | ORAL_TABLET | Freq: Two times a day (BID) | ORAL | 0 refills | Status: AC
  Filled 2022-06-26: qty 30, 5d supply, fill #0

## 2022-06-26 MED ORDER — PROMETHAZINE-DM 6.25-15 MG/5ML PO SYRP
5.0000 mL | ORAL_SOLUTION | Freq: Four times a day (QID) | ORAL | 0 refills | Status: DC | PRN
  Filled 2022-06-26: qty 118, 6d supply, fill #0

## 2022-06-26 NOTE — Progress Notes (Signed)
Pt here today per Dr. Carmelia Roller for a COVID swab and vital check   Pt had two positive COVID test at home, both test were expired.   BP- 142/84 Pulse- 125 O2- 96 Temp- 98.6 F

## 2022-06-26 NOTE — Progress Notes (Signed)
Chief Complaint  Patient presents with   Covid Positive    06/26/22    William [name] here for URI complaints. We are interacting via web portal for an electronic face-to-face visit. I verified patient's ID using 2 identifiers. Patient agreed to proceed with visit via this method. Patient is at home, I am at office. Patient and I are present for visit.   Duration: 2 d Associated symptoms: sinus congestion, rhinorrhea, sore throat, fullness over neck, myalgia, and productive cough Denies: sinus pain, itchy watery eyes, ear pain, ear drainage, wheezing, N/V/D, and shortness of breath Treatment to date: none Sick contacts: No; did just return from a cruise ship. Tested + for covid x 2.   Past Medical History:  Diagnosis Date   Adjustment disorder with mixed anxiety and depressed mood    Arthritis    Asthma    history   Diabetes mellitus    type 2   Elevated blood pressure    history of high blood pressure readings   History of chicken pox    Hypercholesteremia    Hypertension    Internal hemorrhoids    Tubular adenoma of colon     Objective No conversational dyspnea Age appropriate judgment and insight Nml affect and mood  COVID-19 - Plan: nirmatrelvir/ritonavir (PAXLOVID) 20 x 150 MG & 10 x 100MG  TABS, predniSONE (DELTASONE) 20 MG tablet, promethazine-dextromethorphan (PROMETHAZINE-DM) 6.25-15 MG/5ML syrup  Continue to push fluids, practice good hand hygiene, cover mouth when coughing.  Cough syrup, prednisone for throat fullness, Paxlovid. Discussed CDC quarantining recs. He wanted to come in. We will have him come in for NV to swab for covid and get VS.  F/u prn. If starting to experience irreplaceable fluid loss, shaking, or shortness of breath, seek immediate care. Pt voiced understanding and agreement to the plan.  Jilda Roche [place], DO 06/26/22 2:03 PM

## 2022-07-02 ENCOUNTER — Other Ambulatory Visit: Payer: Self-pay | Admitting: Family Medicine

## 2022-07-02 ENCOUNTER — Encounter: Payer: Self-pay | Admitting: Family Medicine

## 2022-07-02 ENCOUNTER — Other Ambulatory Visit (HOSPITAL_BASED_OUTPATIENT_CLINIC_OR_DEPARTMENT_OTHER): Payer: Self-pay

## 2022-07-02 ENCOUNTER — Ambulatory Visit: Payer: Federal, State, Local not specified - PPO | Admitting: Family Medicine

## 2022-07-02 NOTE — Telephone Encounter (Signed)
Last refilled by Endo, ok to fill?

## 2022-07-03 ENCOUNTER — Other Ambulatory Visit (HOSPITAL_BASED_OUTPATIENT_CLINIC_OR_DEPARTMENT_OTHER): Payer: Self-pay

## 2022-07-03 ENCOUNTER — Other Ambulatory Visit: Payer: Self-pay | Admitting: Internal Medicine

## 2022-07-03 ENCOUNTER — Ambulatory Visit: Payer: Federal, State, Local not specified - PPO | Admitting: Family Medicine

## 2022-07-03 ENCOUNTER — Encounter: Payer: Self-pay | Admitting: Family Medicine

## 2022-07-03 ENCOUNTER — Telehealth: Payer: Self-pay

## 2022-07-03 ENCOUNTER — Other Ambulatory Visit: Payer: Self-pay

## 2022-07-03 ENCOUNTER — Other Ambulatory Visit: Payer: Self-pay | Admitting: Family Medicine

## 2022-07-03 VITALS — BP 142/80 | HR 84 | Temp 97.7°F | Ht 72.0 in | Wt 208.0 lb

## 2022-07-03 DIAGNOSIS — U071 COVID-19: Secondary | ICD-10-CM

## 2022-07-03 DIAGNOSIS — I10 Essential (primary) hypertension: Secondary | ICD-10-CM

## 2022-07-03 DIAGNOSIS — F4310 Post-traumatic stress disorder, unspecified: Secondary | ICD-10-CM

## 2022-07-03 MED ORDER — FREESTYLE LIBRE 3 SENSOR MISC
1.0000 | 3 refills | Status: DC
  Filled 2022-07-03: qty 6, 84d supply, fill #0
  Filled 2022-10-04: qty 2, 28d supply, fill #1
  Filled 2022-10-28: qty 2, 28d supply, fill #2
  Filled 2022-12-06: qty 2, 28d supply, fill #3
  Filled 2023-01-17 (×2): qty 2, 28d supply, fill #4
  Filled 2023-01-22 (×2): qty 1, 14d supply, fill #5
  Filled 2023-02-06 – 2023-02-07 (×2): qty 6, 84d supply, fill #5
  Filled 2023-02-25: qty 1, 14d supply, fill #6

## 2022-07-03 MED ORDER — ALPRAZOLAM 1 MG PO TABS
1.0000 mg | ORAL_TABLET | Freq: Every evening | ORAL | 1 refills | Status: DC | PRN
  Filled 2022-07-03: qty 90, 90d supply, fill #0
  Filled 2022-10-28: qty 90, 90d supply, fill #1

## 2022-07-03 NOTE — Telephone Encounter (Signed)
Spoke with [facility] and they state that they have prescription and this request is not needed.

## 2022-07-03 NOTE — Progress Notes (Signed)
Chief Complaint  Patient presents with   Follow-up    Covid     Subjective: Patient is a 62 y.o. male here for f/u covid.  Evaluated 1 week ago, tested + for covid. Feeling better. Having some lingering congestion and post nasal drip. Felt Paxlovid was very helpful.  He also finished a course of prednisone.  Since he is not coughing, he is no longer taking the cough medicine.  No fevers, sore throat, sinus pain, ear pain/drainage, wheezing, or shortness of breath.  Hypertension Patient presents for hypertension follow up. He does not monitor home blood pressures. He is not on his Zesterotic 10-12.5 mg.d  He is usually adhering to a healthy diet overall. No Cp or SOB.   PTSD He is weaned himself from his sertraline, BuSpar, and Seroquel.  He has been taking Xanax to help himself calm down.  He is no longer working so has much less stress.  He is not currently following with a psychiatrist.  Past Medical History:  Diagnosis Date   Adjustment disorder with mixed anxiety and depressed mood    Arthritis    Asthma    history   Diabetes mellitus    type 2   Elevated blood pressure    history of high blood pressure readings   History of chicken pox    Hypercholesteremia    Hypertension    Internal hemorrhoids    Tubular adenoma of colon     Objective: BP (!) 142/80 (BP Location: Left Arm, Cuff Size: Normal)   Pulse 84   Temp 97.7 F (36.5 C) (Oral)   Ht 6' (1.829 m)   Wt 208 lb (94.3 kg)   SpO2 96%   BMI 28.21 kg/m  General: Awake, appears stated age Heart: RRR, no LE edema Lungs: CTAB, no rales, wheezes or rhonchi. No accessory muscle use Psych: Age appropriate judgment and insight, normal affect and mood  Assessment and Plan: PTSD (post-traumatic stress disorder)  Essential hypertension  COVID-19  Chronic, unsure if stable.  I am okay with him taking Xanax daily as needed but do not want him taking it more than 3 times per week.  If he is requiring this, we will  get him back with the psychiatry team or have him start on a daily medication. Chronic, not currently stable.  He did stop his blood pressure medicine as things were getting better.  He will monitor his blood pressure over the next few weeks and let us know if it does not normalize.  If it does, we can stay off of the medication but if it does not we will have to resume lisinopril.  Counseled on diet and exercise. Status post Paxlovid.  No additional medicine needed.  No need to recheck.  He can go mass close starting tomorrow assuming he stays fever free and does not have any new symptoms. I will tentatively see him in 6 months for physical.  He was also told to schedule his colonoscopy.  The number to the GI team was provided. The patient voiced understanding and agreement to the plan.  I spent 40 minutes with the patient discussing the above plans in addition to reviewing his chart on the same day of the visit.  Jilda Roche [place], DO 07/03/22  12:31 PM

## 2022-07-03 NOTE — Patient Instructions (Addendum)
Starting tomorrow, you can go out into public with no mask.   Check your blood pressures 2-3 times per week, alternating the time of day you check it. If it is high, considering waiting 1-2 minutes and rechecking. If it gets higher, your anxiety is likely creeping up and we should avoid rechecking.   I want your blood pressure less than 140 on the top and less than 90 on the bottom consistently. Both goals must be met (ie, 150/70 is too high even though the 70 on the bottom is desirable).   Keep the diet clean and stay active.  Please contact the GI team at: [phone]  The goal for the Xanax is to take sparingly, hopefully less than once per 3 days. If it ends up being more than this, we will discuss a daily medication.   Let us know if you need anything.

## 2022-07-08 ENCOUNTER — Other Ambulatory Visit (HOSPITAL_BASED_OUTPATIENT_CLINIC_OR_DEPARTMENT_OTHER): Payer: Self-pay

## 2022-07-08 ENCOUNTER — Other Ambulatory Visit: Payer: Self-pay | Admitting: Internal Medicine

## 2022-07-08 ENCOUNTER — Other Ambulatory Visit: Payer: Self-pay | Admitting: Family Medicine

## 2022-07-08 DIAGNOSIS — E119 Type 2 diabetes mellitus without complications: Secondary | ICD-10-CM

## 2022-07-08 MED ORDER — COMIRNATY 30 MCG/0.3ML IM SUSY
0.3000 mL | PREFILLED_SYRINGE | Freq: Once | INTRAMUSCULAR | 0 refills | Status: AC
  Filled 2022-07-08: qty 0.3, 1d supply, fill #0

## 2022-07-08 MED ORDER — CHLORHEXIDINE GLUCONATE 0.12 % MT SOLN
15.0000 mL | OROMUCOSAL | 3 refills | Status: DC
  Filled 2022-07-08: qty 473, 16d supply, fill #0
  Filled 2022-07-26: qty 473, 16d supply, fill #1
  Filled 2022-10-04: qty 473, 16d supply, fill #2
  Filled 2022-10-28: qty 473, 16d supply, fill #3

## 2022-07-08 NOTE — Telephone Encounter (Signed)
Previously prescribed with another provider.

## 2022-07-09 ENCOUNTER — Other Ambulatory Visit: Payer: Self-pay

## 2022-07-09 ENCOUNTER — Other Ambulatory Visit (HOSPITAL_BASED_OUTPATIENT_CLINIC_OR_DEPARTMENT_OTHER): Payer: Self-pay

## 2022-07-09 DIAGNOSIS — M5415 Radiculopathy, thoracolumbar region: Secondary | ICD-10-CM | POA: Diagnosis not present

## 2022-07-09 DIAGNOSIS — M9903 Segmental and somatic dysfunction of lumbar region: Secondary | ICD-10-CM | POA: Diagnosis not present

## 2022-07-09 DIAGNOSIS — M546 Pain in thoracic spine: Secondary | ICD-10-CM | POA: Diagnosis not present

## 2022-07-09 DIAGNOSIS — M9902 Segmental and somatic dysfunction of thoracic region: Secondary | ICD-10-CM | POA: Diagnosis not present

## 2022-07-09 MED ORDER — BASAGLAR KWIKPEN 100 UNIT/ML ~~LOC~~ SOPN
55.0000 [IU] | PEN_INJECTOR | Freq: Every day | SUBCUTANEOUS | 1 refills | Status: AC
Start: 2022-07-09 — End: ?
  Filled 2022-07-09: qty 45, 81d supply, fill #0
  Filled 2022-10-04: qty 45, 81d supply, fill #1

## 2022-07-11 ENCOUNTER — Other Ambulatory Visit: Payer: Self-pay

## 2022-07-11 ENCOUNTER — Other Ambulatory Visit (HOSPITAL_BASED_OUTPATIENT_CLINIC_OR_DEPARTMENT_OTHER): Payer: Self-pay

## 2022-07-11 ENCOUNTER — Encounter: Payer: Self-pay | Admitting: Family

## 2022-07-11 ENCOUNTER — Ambulatory Visit: Payer: Federal, State, Local not specified - PPO | Admitting: Family

## 2022-07-11 VITALS — BP 114/68 | HR 93 | Ht 73.0 in | Wt 205.0 lb

## 2022-07-11 DIAGNOSIS — L0390 Cellulitis, unspecified: Secondary | ICD-10-CM

## 2022-07-11 DIAGNOSIS — R0989 Other specified symptoms and signs involving the circulatory and respiratory systems: Secondary | ICD-10-CM

## 2022-07-11 MED ORDER — SILVER SULFADIAZINE 1 % EX CREA
1.0000 | TOPICAL_CREAM | Freq: Two times a day (BID) | CUTANEOUS | 0 refills | Status: DC
  Filled 2022-07-11 (×2): qty 50, 25d supply, fill #0

## 2022-07-11 MED ORDER — DOXYCYCLINE HYCLATE 100 MG PO TABS
100.0000 mg | ORAL_TABLET | Freq: Two times a day (BID) | ORAL | 0 refills | Status: DC
  Filled 2022-07-11: qty 20, 10d supply, fill #0

## 2022-07-12 ENCOUNTER — Other Ambulatory Visit: Payer: Self-pay

## 2022-07-12 NOTE — Progress Notes (Signed)
William [name] is a 62 y.o. 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) 08/12/2017   Prostate cancer screening 07/04/2017   Anxiety and depression 12/11/2016   Class 1 obesity with serious comorbidity and body mass index (BMI) of 31.0 to 31.9 in adult 03/04/2016   Visit for preventive health examination 10/24/2015   Erectile dysfunction 01/31/2013   Hypogonadism in male 07/21/2012   Type 2 diabetes mellitus with hyperglycemia, with long-term current use of insulin (HCC) 11/24/2009   Hyperlipemia 11/24/2009   Essential hypertension 11/24/2009    Current Outpatient Medications  Medication Sig Dispense Refill   ALPRAZolam (XANAX) 1 MG tablet Take 1 tablet (1 mg total) by mouth at bedtime as needed for anxiety. 90 tablet 1   chlorhexidine (PERIOGARD) 0.12 % solution RINSE MOUTH WITH (1 CAPFUL) FOR 30 SECONDS AM AND PM AFTER TOOTHBRUSHING. SPIT AFTER RINSING, DO NOT SWALLOW 473 mL 3   Continuous Glucose Sensor (FREESTYLE LIBRE 3 SENSOR) MISC Use as directed every 14 days 6 each 3   diclofenac (VOLTAREN) 75 MG EC tablet Take 1 tablet (75 mg total) by mouth 2 (two) times daily. 50 tablet 2   doxycycline (VIBRA-TABS) 100 MG tablet Take 1 tablet (100 mg total) by mouth 2 (two) times daily. 20 tablet 0   Empagliflozin-metFORMIN HCl (SYNJARDY) 12.06-998 MG TABS Take 1 tablet by mouth 2 (two) times  daily. 180 tablet 3   fluticasone (FLONASE) 50 MCG/ACT nasal spray Place 2 sprays into both nostrils daily. 16 g 1   Insulin Glargine (BASAGLAR KWIKPEN) 100 UNIT/ML Inject 55 Units into the skin daily. 75 mL 1   Insulin Pen Needle 29G X 12.7MM MISC Use as directed daily in the afternoon 100 each 3   rosuvastatin (CRESTOR) 40 MG tablet Take 1 tablet (40 mg total) by mouth daily. 90 tablet 1   Semaglutide, 2 MG/DOSE, (OZEMPIC, 2 MG/DOSE,) 8 MG/3ML SOPN Inject 2 mg into the skin once a week. 9 mL 3   sildenafil (VIAGRA) 100 MG tablet Take 0.5-1 tablets (50-100 mg total) by mouth daily as needed for erectile dysfunction. *NEED APPT FOR FURTHER REFILLS* 90 tablet 2   silver sulfADIAZINE (SILVADENE) 1 % cream Apply 1 Application topically 2 (two) times daily. 50 g 0   Sod Fluoride-Potassium Nitrate (PREVIDENT 5000 SENSITIVE) 1.1-5 % GEL USE AS DIRECTED ON PACKAGE 100 mL 1   Sodium Fluoride 1.1 % PSTE Apply a pea-sized amount of the paste to a toothbrush and brush thoroughly for 2 minutes preferably at bedtime. 100 mL 0   SYRINGE-NEEDLE, DISP, 3 ML (B-D 3CC LUER-LOK SYR 22GX1") 22G X 1" 3 ML MISC Use to inject testosterone 10 each 3   testosterone enanthate (DELATESTRYL) 200 MG/ML injection Inject 0.25 mLs (50 mg total) into the muscle every 14 (fourteen) days. 5 mL 5   No current facility-administered medications for this visit.    Allergies: Patient has no known allergies.  Past Medical History:  Diagnosis Date   Adjustment disorder with mixed anxiety and depressed mood    Arthritis    Asthma    history   Diabetes mellitus    type 2   Elevated blood pressure    history of high blood pressure readings   History of chicken pox    Hypercholesteremia    Hypertension    Internal hemorrhoids    Tubular adenoma of colon     Past Surgical History:  Procedure Laterality Date   ELBOW SURGERY     right elbow 2010, left elbow 08/06/10 Wendall Mola)   KNEE SURGERY     right knee new ACL-  [phone]   KNEE SURGERY     left knee 2003   NASAL SEPTUM SURGERY     1992   PARTIAL HIP ARTHROPLASTY     right hip replacement    Family History  Problem Relation Age of Onset   Diabetes Mother    Thyroid disease Mother        Questionable   Heart disease Father        deceased   Other Neg Hx        hypogonadism   Colon cancer Neg Hx     Social History   Tobacco Use   Smoking status: Never   Smokeless tobacco: Never  Substance Use Topics   Alcohol use: Yes    Alcohol/week: 7.0 - 14.0 standard drinks of alcohol    Types: 7 - 14 Glasses of wine per week    Subjective:   2 sores noted on upper shoulders bilaterally- no known injury or trauma; has been told has MRSA in the past;  Also notes that had an episode on Thursday morning where he felt like he was choking/ "airway closed" but was able to swallow water; no coughing, chest pain;   Objective:  Vitals:   07/11/22 1509  BP: 114/68  Pulse: 93  SpO2: 97%  Weight: 205 lb (93 kg)  Height: 6\' 1"  (1.854 m)    General: Well developed, well nourished, in no acute distress  Skin : Warm and dry. 2 scabbed lesions noted on bilateral shoulders;  Head: Normocephalic and atraumatic  Lungs: Respirations unlabored;  Neurologic: Alert and oriented; speech intact; face symmetrical; moves all extremities well; CNII-XII intact without focal deficit   Assessment:  1. Cellulitis, unspecified cellulitis site   2. Choking episode     Plan:  Suspect MRSA; update culture; Rx for Doxycycline 100 mg bid x 7 days; refill on Bactroban per patient request;  Physical exam today is reassuring; discussed referral to GI and patient defers at this time but will call back if subsequent episodes occur.   No follow-ups on file.  Orders Placed This Encounter  Procedures   WOUND CULTURE    Order Specific Question:   Source    Answer:   skin upper shoulder    Requested Prescriptions   Signed Prescriptions Disp Refills   doxycycline  (VIBRA-TABS) 100 MG tablet 20 tablet 0    Sig: Take 1 tablet (100 mg total) by mouth 2 (two) times daily.   silver sulfADIAZINE (SILVADENE) 1 % cream 50 g 0    Sig: Apply 1 Application topically 2 (two) times daily.

## 2022-07-13 LAB — WOUND CULTURE

## 2022-07-15 DIAGNOSIS — M9903 Segmental and somatic dysfunction of lumbar region: Secondary | ICD-10-CM | POA: Diagnosis not present

## 2022-07-15 DIAGNOSIS — M9902 Segmental and somatic dysfunction of thoracic region: Secondary | ICD-10-CM | POA: Diagnosis not present

## 2022-07-15 DIAGNOSIS — M5415 Radiculopathy, thoracolumbar region: Secondary | ICD-10-CM | POA: Diagnosis not present

## 2022-07-15 DIAGNOSIS — M546 Pain in thoracic spine: Secondary | ICD-10-CM | POA: Diagnosis not present

## 2022-07-16 LAB — WOUND CULTURE
MICRO NUMBER:: 15049997
RESULT:: NO GROWTH
SPECIMEN QUALITY:: ADEQUATE

## 2022-07-19 ENCOUNTER — Other Ambulatory Visit (HOSPITAL_COMMUNITY): Payer: Self-pay

## 2022-07-19 ENCOUNTER — Telehealth: Payer: Self-pay

## 2022-07-19 ENCOUNTER — Encounter: Payer: Self-pay | Admitting: Family Medicine

## 2022-07-19 NOTE — Telephone Encounter (Signed)
*  Endo  PA request received for Testosterone Enanthate 200MG /ML solution  PA submitted to [employer] via CMM and is pending additional questions/determination  Key: [id number]

## 2022-07-22 ENCOUNTER — Other Ambulatory Visit: Payer: Self-pay | Admitting: Family Medicine

## 2022-07-22 DIAGNOSIS — F32A Depression, unspecified: Secondary | ICD-10-CM

## 2022-07-22 MED ORDER — BUPROPION HCL ER (XL) 300 MG PO TB24
300.0000 mg | ORAL_TABLET | Freq: Every day | ORAL | 11 refills | Status: DC
Start: 2022-07-22 — End: 2022-12-11

## 2022-07-22 MED ORDER — SERTRALINE HCL 100 MG PO TABS: 100.0000 mg | ORAL_TABLET | Freq: Two times a day (BID) | ORAL | 1 refills | Status: DC

## 2022-07-23 NOTE — Telephone Encounter (Signed)
PA has been APPROVED from 07/19/2022-07/18/2023 

## 2022-07-26 ENCOUNTER — Other Ambulatory Visit (HOSPITAL_BASED_OUTPATIENT_CLINIC_OR_DEPARTMENT_OTHER): Payer: Self-pay

## 2022-07-29 ENCOUNTER — Encounter: Payer: Self-pay | Admitting: Podiatry

## 2022-07-29 ENCOUNTER — Ambulatory Visit: Payer: Federal, State, Local not specified - PPO | Admitting: Podiatry

## 2022-07-29 DIAGNOSIS — M722 Plantar fascial fibromatosis: Secondary | ICD-10-CM | POA: Diagnosis not present

## 2022-07-30 NOTE — Progress Notes (Signed)
Subjective:   Patient ID: William [name], male   DOB: 62 y.o.   MRN: [id number]   HPI Patient presents concerned about discomfort he still has and also that he is concerned about his work status    ROS      Objective:  Physical Exam  Neurovascular status intact mild discomfort currently but he is walking better as he is not working currently     Assessment:  Chronic Planter fasciitis which is gradually improving with no work with history of probable stress fracture of the cuboid bone left     Plan:  Reviewed condition I think reasonably without work he is doing well now but I cannot give him any guarantee if he did have to return that the pain would not recur to a significant nature

## 2022-07-31 ENCOUNTER — Other Ambulatory Visit (HOSPITAL_BASED_OUTPATIENT_CLINIC_OR_DEPARTMENT_OTHER): Payer: Self-pay

## 2022-10-04 ENCOUNTER — Other Ambulatory Visit: Payer: Self-pay | Admitting: Internal Medicine

## 2022-10-04 ENCOUNTER — Other Ambulatory Visit: Payer: Self-pay

## 2022-10-04 ENCOUNTER — Other Ambulatory Visit (HOSPITAL_BASED_OUTPATIENT_CLINIC_OR_DEPARTMENT_OTHER): Payer: Self-pay

## 2022-10-04 ENCOUNTER — Other Ambulatory Visit: Payer: Self-pay | Admitting: Family Medicine

## 2022-10-04 ENCOUNTER — Other Ambulatory Visit (HOSPITAL_COMMUNITY): Payer: Self-pay | Admitting: Psychiatry

## 2022-10-04 DIAGNOSIS — F419 Anxiety disorder, unspecified: Secondary | ICD-10-CM

## 2022-10-04 MED ORDER — BUPROPION HCL ER (XL) 300 MG PO TB24
300.0000 mg | ORAL_TABLET | Freq: Every day | ORAL | 11 refills | Status: DC
Start: 2022-10-04 — End: 2023-10-04
  Filled 2022-10-04: qty 30, 30d supply, fill #0
  Filled 2022-10-28: qty 30, 30d supply, fill #1

## 2022-10-04 MED ORDER — SYNJARDY 12.5-1000 MG PO TABS
1.0000 | ORAL_TABLET | Freq: Two times a day (BID) | ORAL | 3 refills | Status: DC
  Filled 2022-10-04 – 2022-11-01 (×3): qty 180, 90d supply, fill #0
  Filled 2023-03-10: qty 180, 90d supply, fill #1

## 2022-10-08 ENCOUNTER — Other Ambulatory Visit (HOSPITAL_BASED_OUTPATIENT_CLINIC_OR_DEPARTMENT_OTHER): Payer: Self-pay

## 2022-10-09 ENCOUNTER — Other Ambulatory Visit (HOSPITAL_BASED_OUTPATIENT_CLINIC_OR_DEPARTMENT_OTHER): Payer: Self-pay

## 2022-10-10 ENCOUNTER — Other Ambulatory Visit (HOSPITAL_BASED_OUTPATIENT_CLINIC_OR_DEPARTMENT_OTHER): Payer: Self-pay

## 2022-10-14 ENCOUNTER — Other Ambulatory Visit (HOSPITAL_BASED_OUTPATIENT_CLINIC_OR_DEPARTMENT_OTHER): Payer: Self-pay

## 2022-10-15 ENCOUNTER — Other Ambulatory Visit (HOSPITAL_BASED_OUTPATIENT_CLINIC_OR_DEPARTMENT_OTHER): Payer: Self-pay

## 2022-10-16 ENCOUNTER — Other Ambulatory Visit (HOSPITAL_BASED_OUTPATIENT_CLINIC_OR_DEPARTMENT_OTHER): Payer: Self-pay

## 2022-10-17 ENCOUNTER — Other Ambulatory Visit (HOSPITAL_BASED_OUTPATIENT_CLINIC_OR_DEPARTMENT_OTHER): Payer: Self-pay

## 2022-10-21 ENCOUNTER — Other Ambulatory Visit (HOSPITAL_BASED_OUTPATIENT_CLINIC_OR_DEPARTMENT_OTHER): Payer: Self-pay

## 2022-10-22 ENCOUNTER — Other Ambulatory Visit (HOSPITAL_BASED_OUTPATIENT_CLINIC_OR_DEPARTMENT_OTHER): Payer: Self-pay

## 2022-10-28 ENCOUNTER — Other Ambulatory Visit (HOSPITAL_BASED_OUTPATIENT_CLINIC_OR_DEPARTMENT_OTHER): Payer: Self-pay

## 2022-10-28 ENCOUNTER — Other Ambulatory Visit: Payer: Self-pay

## 2022-10-28 ENCOUNTER — Other Ambulatory Visit: Payer: Self-pay | Admitting: Internal Medicine

## 2022-10-28 MED ORDER — "BD LUER-LOK SYRINGE 22G X 1"" 3 ML MISC"
1.0000 | 3 refills | Status: AC
  Filled 2022-10-28: qty 10, 140d supply, fill #0
  Filled 2023-02-07: qty 10, 140d supply, fill #1
  Filled 2023-05-28: qty 4, 56d supply, fill #2
  Filled ????-??-??: fill #2

## 2022-10-29 ENCOUNTER — Other Ambulatory Visit (HOSPITAL_BASED_OUTPATIENT_CLINIC_OR_DEPARTMENT_OTHER): Payer: Self-pay

## 2022-10-31 ENCOUNTER — Other Ambulatory Visit: Payer: Self-pay

## 2022-10-31 ENCOUNTER — Ambulatory Visit (HOSPITAL_COMMUNITY): Payer: Federal, State, Local not specified - PPO | Admitting: Psychiatry

## 2022-10-31 ENCOUNTER — Encounter (HOSPITAL_COMMUNITY): Payer: Self-pay | Admitting: Psychiatry

## 2022-10-31 VITALS — BP 126/77 | HR 98 | Ht 72.0 in | Wt 212.0 lb

## 2022-10-31 DIAGNOSIS — F4312 Post-traumatic stress disorder, chronic: Secondary | ICD-10-CM

## 2022-10-31 DIAGNOSIS — F419 Anxiety disorder, unspecified: Secondary | ICD-10-CM

## 2022-10-31 DIAGNOSIS — F32A Depression, unspecified: Secondary | ICD-10-CM | POA: Diagnosis not present

## 2022-10-31 NOTE — Progress Notes (Signed)
BH MD/PA/NP OP Progress Note  10/31/2022 3:52 PM William [name]  MRN:  [id number]  Patient location; office Provider location; office  HPI: Patient came in for his appointment.  He was last seen 6 months ago.  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  He stayed 2 months.  He is back in August and he noticed some time lack of motivation to do things.  He reported has significant cut down his psychotropic medication.  He takes Xanax half tablet only as needed when he feels nervous or anxious.  He is also not taking Zoloft and Wellbutrin on a regular basis.  He admitted sometimes take when he feel he is sad or depressed but realize it is a placebo effect.  Sometime he takes quetiapine 25 mg when he cannot sleep.  We have recommended not to take the quetiapine because he was taking trazodone.  He feel he does not need the medicine because sleeps good but he still have chronic memories about his work at [employer].  When he think about his job at [employer] he get frustrated, irritable and overwhelmed.  He has chronic PTSD related to his job at [employer].  He is now retired but still waiting for his compensation from [employer].  He has attorney who is working on his case.  He is optimistic but not sure when it will happen.  He denies any hallucination, paranoia, suicidal thoughts.  He has planned to go back to [place] and December.  He is hoping once his compensation money settle then he can invest money in [place].  Patient also show pictures when he was in [place] with the group of children and he was very happy.  He denies any anger or any violence.  He admitted few pounds weight gain but hoping to get better and his physical health once he is more motivated to do things.   Visit Diagnosis:    ICD-10-CM   1. Chronic post-traumatic stress disorder (PTSD)  F43.12     2. Anxiety and depression  F41.9    F32.A        Past Psychiatric  History: History of depression and anxiety.  He was also given the diagnosis of ADHD by [facility].  He tried Wellbutrin but did not continued along.  He started taking medication in year 2000 by primary care physician.  Initially tried Paxil but due to side effects which he describes shocklike buzz he stopped.  Denies any history of suicidal attempt but endorsed history of suicidal thoughts, mood swing, irritability, anger and panic attacks.  Past Medical History:  Past Medical History:  Diagnosis Date   Adjustment disorder with mixed anxiety and depressed mood    Arthritis    Asthma    history   Diabetes mellitus    type 2   Elevated blood pressure    history of high blood pressure readings   History of chicken pox    Hypercholesteremia    Hypertension    Internal hemorrhoids    Tubular adenoma of colon     Past Surgical History:  Procedure Laterality Date   ELBOW SURGERY     right elbow 2010, left elbow 08/06/10 Wendall Mola)   KNEE SURGERY     right knee new ACL- [phone]   KNEE SURGERY     left knee 2003   NASAL SEPTUM SURGERY     1992  PARTIAL HIP ARTHROPLASTY     right hip replacement    Family Psychiatric History: Reviewed.  Family History:  Family History  Problem Relation Age of Onset   Diabetes Mother    Thyroid disease Mother        Questionable   Heart disease Father        deceased   Other Neg Hx        hypogonadism   Colon cancer Neg Hx     Social History:  Social History   Socioeconomic History   Marital status: Married    Spouse name: Not on file   Number of children: Not on file   Years of education: Not on file   Highest education level: Not on file  Occupational History   Not on file  Tobacco Use   Smoking status: Never   Smokeless tobacco: Never  Substance and Sexual Activity   Alcohol use: Yes    Alcohol/week: 7.0 - 14.0 standard drinks of alcohol    Types: 7 - 14 Glasses of wine per week   Drug use: No   Sexual activity: Not  on file  Other Topics Concern   Not on file  Social History Narrative   Occupation: Real Therapist, occupational   Married -15 marriage (2nd marriage)   daughter 72,  2 sons (2nd marriage  11,6)   [place]   Never Smoked    Alcohol use-yes   Drug use-no    Regular exercise-no   Smoking Status:  never   Does Patient Exercise:  no   Caffeine use/day:  3-4 cups coffee daily   Drug Use:  no         Social Determinants of Health   Financial Resource Strain: Not on file  Food Insecurity: Not on file  Transportation Needs: Not on file  Physical Activity: Not on file  Stress: Not on file  Social Connections: Not on file    Allergies: No Known Allergies  Metabolic Disorder Labs: Lab Results  Component Value Date   HGBA1C 7.7 (A) 05/01/2022   MPG 246 07/04/2017   MPG 174 12/09/2016   Lab Results  Component Value Date   PROLACTIN 3.4 09/27/2014   Lab Results  Component Value Date   CHOL 123 01/04/2021   TRIG (H) 01/04/2021    500.0 Triglyceride is over 400; calculations on Lipids are invalid.   HDL 24.00 (L) 01/04/2021   CHOLHDL 5 01/04/2021   VLDL 46.2 (H) 07/18/2020   LDLCALC 154 (H) 12/09/2016   LDLCALC 73 06/28/2016   Lab Results  Component Value Date   TSH 2.89 12/14/2019   TSH 3.91 06/28/2016    Therapeutic Level Labs: No results found for: "LITHIUM" No results found for: "VALPROATE" No results found for: "CBMZ"  Current Medications: Current Outpatient Medications  Medication Sig Dispense Refill   buPROPion (WELLBUTRIN XL) 300 MG 24 hr tablet Take 1 tablet (300 mg total) by mouth daily. 30 tablet 11   chlorhexidine (PERIOGARD) 0.12 % solution RINSE MOUTH WITH (1 CAPFUL) FOR 30 SECONDS AM AND PM AFTER TOOTHBRUSHING. SPIT AFTER RINSING, DO NOT SWALLOW 473 mL 3   Continuous Glucose Sensor (FREESTYLE LIBRE 3 SENSOR) MISC Use as directed every 14 days 6 each 3   fluticasone (FLONASE) 50 MCG/ACT nasal spray Place 2 sprays into both nostrils daily. 16 g 1   Insulin  Glargine (BASAGLAR KWIKPEN) 100 UNIT/ML Inject 55 Units into the skin daily. 75 mL 1   Insulin Pen Needle 29G  X 12. MISC Use as directed daily in the afternoon 100 each 3   Semaglutide, 2 MG/DOSE, (OZEMPIC, 2 MG/DOSE,) 8 MG/3ML SOPN Inject 2 mg into the skin once a week. 9 mL 3   sertraline (ZOLOFT) 100 MG tablet Take 1 tablet (100 mg total) by mouth 2 (two) times daily. 180 tablet 1   sildenafil (VIAGRA) 100 MG tablet Take 0.5-1 tablets (50-100 mg total) by mouth daily as needed for erectile dysfunction. *NEED APPT FOR FURTHER REFILLS* 90 tablet 2   silver sulfADIAZINE (SILVADENE) 1 % cream Apply 1 Application topically 2 (two) times daily. 50 g 0   Sod Fluoride-Potassium Nitrate (PREVIDENT 5000 SENSITIVE) 1.1-5 % GEL USE AS DIRECTED ON PACKAGE 100 mL 1   SYRINGE-NEEDLE, DISP, 3 ML (B-D 3CC LUER-LOK SYR 22GX1") 22G X 1" 3 ML MISC Use to inject testosterone 10 each 3   testosterone enanthate (DELATESTRYL) 200 MG/ML injection Inject 0.25 mLs (50 mg total) into the muscle every 14 (fourteen) days. 5 mL 5   ALPRAZolam (XANAX) 1 MG tablet Take 1 tablet (1 mg total) by mouth at bedtime as needed for anxiety. 90 tablet 1   buPROPion (WELLBUTRIN XL) 300 MG 24 hr tablet Take 1 tablet (300 mg total) by mouth daily.**NEED DIABETIC FOLLOW UP** 30 tablet 11   diclofenac (VOLTAREN) 75 MG EC tablet Take 1 tablet (75 mg total) by mouth 2 (two) times daily. (Patient not taking: Reported on 10/31/2022) 50 tablet 2   doxycycline (VIBRA-TABS) 100 MG tablet Take 1 tablet (100 mg total) by mouth 2 (two) times daily. (Patient not taking: Reported on 10/31/2022) 20 tablet 0   Empagliflozin-metFORMIN HCl (SYNJARDY) 12.06-998 MG TABS Take 1 tablet by mouth 2 (two) times daily. 180 tablet 3   rosuvastatin (CRESTOR) 40 MG tablet Take 1 tablet (40 mg total) by mouth daily. (Patient not taking: Reported on 10/31/2022) 90 tablet 1   Sodium Fluoride 1.1 % PSTE Apply a pea-sized amount of the paste to a toothbrush and brush  thoroughly for 2 minutes preferably at bedtime. 100 mL 0   No current facility-administered medications for this visit.     Musculoskeletal: Strength & Muscle Tone: within normal limits Gait & Station: normal Patient leans: N/A  Psychiatric Specialty Exam: Physical Exam  Review of Systems  Blood pressure 126/77, pulse 98, height 6' (1.829 m), weight 212 lb (96.2 kg).Body mass index is 28.75 kg/m.  General Appearance: Well Groomed  Eye Contact:  Good  Speech:  Clear and Coherent and Normal Rate  Volume:  Normal  Mood:  Euthymic  Affect:  Congruent  Thought Process:  Goal Directed  Orientation:  Full (Time, Place, and Person)  Thought Content:  Logical  Suicidal Thoughts:  No  Homicidal Thoughts:  No  Memory:  Immediate;   Good Recent;   Good Remote;   Good  Judgement:  Intact  Insight:  Good  Psychomotor Activity:  Normal  Concentration:  Concentration: Good and Attention Span: Good  Recall:  Good  Fund of Knowledge:  Good  Language:  Good  Akathisia:  No  Handed:  Right  AIMS (if indicated):     Assets:  Communication Skills Desire for Improvement Housing Resilience Social Support Transportation  ADL's:  Intact  Cognition:  WNL  Sleep:   ok     Screenings:   Equities trader Office Visit from 08/31/2021 in [facility] at [facility] Office Visit from 07/21/2020 in [facility] at [facility] Video Visit from 12/06/2019 in [facility] at [facility] Video Visit from 06/11/2019 in [facility] at [facility] Visit from 08/13/2018 in [facility] at [facility] Total Score 6 0 1 1 1   PHQ-9 Total Score 14 -- 1 2 4         Assessment and Plan: Chronic PTSD.  Anxiety and depression.  I review his medication.  He is only taking Xanax half tablet as needed every few weeks when he feels nervous or anxious.  I  recommend to discontinue quetiapine again since he like to wean himself slowly and gradually from the medication.  He is also not taking consistently Wellbutrin and sertraline prescribed by PCP.  We talk in detail about how to wean himself off from the medication.  It would be easier since he is not taking consistently and not getting therapeutic level of Wellbutrin and Zoloft.  I do encourage to consider seeing a therapist to help his chronic PTSD symptoms from trauma working at [employer].  He is hoping his legal case settled soon and his attorney.  Like to have records sent to his returning and I am happy to do that once we kept the consent from him.  No new medication given to him from this encounter.  He like to have future visit as needed basis which I agree.  I recommend to call us back if he feel worsening of symptoms or need help in the future.  I wish him good luck.  Patient promised he will consider the recommendation of therapy to help his chronic PTSD.   Collaboration of Care: Collaboration of Care: Other provider involved in patient's care AEB notes are available in epic to review.  Patient/Guardian was advised Release of Information must be obtained prior to any record release in order to collaborate their care with an outside provider. Patient/Guardian was advised if they have not already done so to contact the registration department to sign all necessary forms in order for [place] to release information regarding their care.   Consent: Patient/Guardian gives verbal consent for treatment and assignment of benefits for services provided during this visit. Patient/Guardian expressed understanding and agreed to proceed.    Cleotis Nipper, MD 10/31/2022, 3:52 PM

## 2022-11-01 ENCOUNTER — Encounter: Payer: Self-pay | Admitting: Internal Medicine

## 2022-11-01 ENCOUNTER — Ambulatory Visit: Payer: Federal, State, Local not specified - PPO | Admitting: Internal Medicine

## 2022-11-01 ENCOUNTER — Other Ambulatory Visit (HOSPITAL_BASED_OUTPATIENT_CLINIC_OR_DEPARTMENT_OTHER): Payer: Self-pay

## 2022-11-01 ENCOUNTER — Telehealth: Payer: Self-pay | Admitting: Internal Medicine

## 2022-11-01 VITALS — BP 132/80 | HR 100 | Wt 207.0 lb

## 2022-11-01 DIAGNOSIS — E1165 Type 2 diabetes mellitus with hyperglycemia: Secondary | ICD-10-CM | POA: Diagnosis not present

## 2022-11-01 DIAGNOSIS — Z794 Long term (current) use of insulin: Secondary | ICD-10-CM | POA: Diagnosis not present

## 2022-11-01 DIAGNOSIS — E291 Testicular hypofunction: Secondary | ICD-10-CM | POA: Diagnosis not present

## 2022-11-01 DIAGNOSIS — E785 Hyperlipidemia, unspecified: Secondary | ICD-10-CM

## 2022-11-01 LAB — POCT GLYCOSYLATED HEMOGLOBIN (HGB A1C): Hemoglobin A1C: 7.2 % — AB (ref 4.0–5.6)

## 2022-11-01 MED ORDER — BASAGLAR KWIKPEN 100 UNIT/ML ~~LOC~~ SOPN
70.0000 [IU] | PEN_INJECTOR | Freq: Every day | SUBCUTANEOUS | 3 refills | Status: DC
Start: 2022-11-01 — End: 2023-03-10
  Filled 2022-11-01: qty 75, 107d supply, fill #0
  Filled 2023-01-30: qty 33, 48d supply, fill #0
  Filled 2023-01-30 – 2023-02-06 (×3): qty 30, 42d supply, fill #0
  Filled 2023-02-06: qty 33, 48d supply, fill #0

## 2022-11-01 MED ORDER — TIRZEPATIDE 5 MG/0.5ML ~~LOC~~ SOAJ
5.0000 mg | SUBCUTANEOUS | 3 refills | Status: DC
  Filled 2022-11-01: qty 6, 84d supply, fill #0
  Filled 2023-01-17: qty 2, 28d supply, fill #0
  Filled 2023-02-12: qty 6, 84d supply, fill #0

## 2022-11-01 MED ORDER — INSULIN PEN NEEDLE 32G X 4 MM MISC
1.0000 | Freq: Every day | 3 refills | Status: DC
  Filled 2022-11-01: qty 100, 90d supply, fill #0
  Filled 2023-01-30: qty 100, 30d supply, fill #0

## 2022-11-01 MED ORDER — TESTOSTERONE ENANTHATE 200 MG/ML IM SOLN
50.0000 mg | INTRAMUSCULAR | 5 refills | Status: DC
  Filled 2022-11-01: qty 5, 28d supply, fill #0
  Filled 2022-11-01: qty 5, 280d supply, fill #0
  Filled 2023-01-22: qty 5, 28d supply, fill #1
  Filled 2023-03-10: qty 5, 28d supply, fill #2

## 2022-11-01 NOTE — Telephone Encounter (Signed)
Patient is calling to say that per the pharmacy Synjardy 12.06/998 mg 2 tabs daily needs a prior authorization.

## 2022-11-01 NOTE — Progress Notes (Unsigned)
Name: JAMIYL FLIGHT  MRN/ DOB: [id number], Oct 02, 1960   Age/ Sex: 62 y.o., male    PCP: Sharlene Dory, DO   Reason for Endocrinology Evaluation: Type 2 Diabetes Mellitus     Date of Initial Endocrinology Visit: 06/18/2021    PATIENT IDENTIFIER: Mr. COSIMO DOCTOR is a 62 y.o. male with a past medical history of HTN, T2DM, hypogonadism. The patient presented for initial endocrinology clinic visit on 06/18/2021 for consultative assistance with his diabetes management.    HPI: Mr. Raysor was    Diagnosed with DM 2000 Prior Medications tried/Intolerance:  Hemoglobin A1c has ranged from 6.5% in 2020, peaking at 9.9% in 2022.  On his initial visit to our clinic and an A1c of 7.7%, he was on Oronoque, Ozempic, and Basaglar  TESTOSTERONE HISTORY: He has been noted with low testosterone since 2013, with a nadir of 123 NG/DL in 4782. He stopped taking testosterone ~ 2 months ago because it did not help with energy help.    He was evaluated by urology 01/2022 due to elevated PSA in the setting of testosterone therapy, no intervention was offered at the time, no prostate nodules were detected.  SUBJECTIVE:   During the last visit (05/01/2022): A1c 7.7%   Today (11/01/22): Mr. Boyack is here for a follow up on diabetes and hypogonadism management. He    checks his blood sugars multiple  times daily through freestyle libre . The patient has not had hypoglycemia .   He continues to follow-up with behavioral health for anxiety and depression Follows with podiatry, last visit 07/29/2022  Noted hyperglycemia after returning from [place] in July,2024  Stays up late at night, no excessive ETOH   Has been out of [place]  since end of August   Last Testosterone was 2 Sundays ago      HOME ENDOCRINE  REGIMEN: Synjardy 12.06/998 mg 2 tabs daily Basaglar 55 units QAM- 70 units  Ozempic 2 mg weekly Testosterone enanthate 200 mg/ML 50 mg (0.25 mL) Q 14 days ( Sunday)    Statin:  Yes ACE-I/ARB: yes     CONTINUOUS GLUCOSE MONITORING RECORD INTERPRETATION    Dates of Recording:9/14-9/27/2024  Sensor description:freestyle libre   Results statistics:   CGM use % of time 97  Average and SD 228/27  Time in range 25%  % Time Above 180 38  % Time above 250 37  % Time Below target 0       Glycemic patterns summary: BGs have been high throughout the night and most of the day  Hyperglycemic episodes  postprandial   Hypoglycemic episodes occurred N/A  Overnight periods: High     DIABETIC COMPLICATIONS: Microvascular complications:  Nonproliferative DR Denies: CKD, neuropathy  Last eye exam: Completed 02/2020  Macrovascular complications:   Denies: CAD, PVD, CVA   PAST HISTORY: Past Medical History:  Past Medical History:  Diagnosis Date   Adjustment disorder with mixed anxiety and depressed mood    Arthritis    Asthma    history   Diabetes mellitus    type 2   Elevated blood pressure    history of high blood pressure readings   History of chicken pox    Hypercholesteremia    Hypertension    Internal hemorrhoids    Tubular adenoma of colon    Past Surgical History:  Past Surgical History:  Procedure Laterality Date   ELBOW SURGERY     right elbow 2010, left elbow 08/06/10 Wendall Mola)   KNEE SURGERY  right knee new ACL- [id number]   KNEE SURGERY     left knee 2003   NASAL SEPTUM SURGERY     1992   PARTIAL HIP ARTHROPLASTY     right hip replacement    Social History:  reports that he has never smoked. He has never used smokeless tobacco. He reports current alcohol use of about 7.0 - 14.0 standard drinks of alcohol per week. He reports that he does not use drugs. Family History:  Family History  Problem Relation Age of Onset   Diabetes Mother    Thyroid disease Mother        Questionable   Heart disease Father        deceased   Other Neg Hx        hypogonadism   Colon cancer Neg Hx      HOME  MEDICATIONS: Allergies as of 11/01/2022   No Known Allergies      Medication List        Accurate as of November 01, 2022  2:09 PM. If you have any questions, ask your nurse or doctor.          ALPRAZolam 1 MG tablet Commonly known as: XANAX Take 1 tablet (1 mg total) by mouth at bedtime as needed for anxiety. What changed: additional instructions   B-D 3CC LUER-LOK SYR 22GX1" 22G X 1" 3 ML Misc Generic drug: SYRINGE-NEEDLE (DISP) 3 ML Use to inject testosterone   Basaglar KwikPen 100 UNIT/ML Inject 55 Units into the skin daily. What changed: how much to take   buPROPion 300 MG 24 hr tablet Commonly known as: WELLBUTRIN XL Take 1 tablet (300 mg total) by mouth daily.   chlorhexidine 0.12 % solution Commonly known as: Periogard RINSE MOUTH WITH (1 CAPFUL) FOR 30 SECONDS AM AND PM AFTER TOOTHBRUSHING. SPIT AFTER RINSING, DO NOT SWALLOW   diclofenac 75 MG EC tablet Commonly known as: VOLTAREN Take 1 tablet (75 mg total) by mouth 2 (two) times daily.   doxycycline 100 MG tablet Commonly known as: VIBRA-TABS Take 1 tablet (100 mg total) by mouth 2 (two) times daily.   fluticasone 50 MCG/ACT nasal spray Commonly known as: FLONASE Place 2 sprays into both nostrils daily.   FreeStyle Libre 3 Sensor Misc Use as directed every 14 days   Insulin Pen Needle 29G X 12.7MM Misc Use as directed daily in the afternoon   Ozempic (2 MG/DOSE) 8 MG/3ML Sopn Generic drug: Semaglutide (2 MG/DOSE) Inject 2 mg into the skin once a week.   PreviDent 5000 Sensitive 1.1-5 % Gel Generic drug: Sod Fluoride-Potassium Nitrate USE AS DIRECTED ON PACKAGE   rosuvastatin 40 MG tablet Commonly known as: CRESTOR Take 1 tablet (40 mg total) by mouth daily.   sertraline 100 MG tablet Commonly known as: ZOLOFT Take 1 tablet (100 mg total) by mouth 2 (two) times daily.   sildenafil 100 MG tablet Commonly known as: VIAGRA Take 0.5-1 tablets (50-100 mg total) by mouth daily as  needed for erectile dysfunction. *NEED APPT FOR FURTHER REFILLS*   Sodium Fluoride 5000 PPM 1.1 % Pste Generic drug: Sodium Fluoride Apply a pea-sized amount of the paste to a toothbrush and brush thoroughly for 2 minutes preferably at bedtime.   SSD 1 % cream Generic drug: silver sulfADIAZINE Apply 1 Application topically 2 (two) times daily.   Synjardy 12.06-998 MG Tabs Generic drug: Empagliflozin-metFORMIN HCl Take 1 tablet by mouth 2 (two) times daily.   testosterone enanthate 200 MG/ML injection Commonly  known as: DELATESTRYL Inject 0.25 mLs (50 mg total) into the muscle every 14 (fourteen) days.         ALLERGIES: No Known Allergies   REVIEW OF SYSTEMS: A comprehensive ROS was conducted with the patient and is negative except as per HPI     OBJECTIVE:   VITAL SIGNS: BP 132/80 (BP Location: Left Arm, Patient Position: Sitting, Cuff Size: Small)   Pulse 100   Wt 207 lb (93.9 kg) Comment: patient reported  SpO2 96%   BMI 28.07 kg/m    PHYSICAL EXAM:  General: Pt appears well and is in NAD  Lungs: Clear with good BS bilat  Heart: RRR   Extremities:  Lower extremities - No pretibial edema.  Neuro: MS is good with appropriate affect, pt is alert and Ox3    DM foot exam:07/29/2022 Per podiatry      DATA REVIEWED:  Lab Results  Component Value Date   HGBA1C 7.7 (A) 05/01/2022   HGBA1C 7.7 (A) 10/29/2021   HGBA1C 7.7 (A) 06/18/2021   Lab Results  Component Value Date   MICROALBUR <0.7 06/09/2019   LDLCALC 154 (H) 12/09/2016   CREATININE 1.11 10/30/2021   Lab Results  Component Value Date   MICRALBCREAT 1.4 06/09/2019    Lab Results  Component Value Date   CHOL 123 01/04/2021   HDL 24.00 (L) 01/04/2021   LDLCALC 154 (H) 12/09/2016   LDLDIRECT 58.0 01/04/2021   TRIG (H) 01/04/2021    500.0 Triglyceride is over 400; calculations on Lipids are invalid.   CHOLHDL 5 01/04/2021        ASSESSMENT / PLAN / RECOMMENDATIONS:   1) Type 2 Diabetes  Mellitus, Sub-Optimally controlled, With retinopathic complications - Most recent A1c of 7.2  %. Goal A1c <7.0%.     -Patient endorses an A1c of 6.5% while in [place] back in July but in reviewing his glucose data over the past month he has been noted with severe hyperglycemia which is an usual, I suspect medication nonadherence, patient states that the only thing he has been out of his Synjardy over the past month -Discussed the importance of restarting Synjardy, he will remain on the higher dose of Basaglar at this time -I have also recommended switching Ozempic to [place], I did explain to the patient that this is a small dose of Mounjaro and the purpose of starting dose is to be able to avoid side effects with higher doses, we will increase as tolerated   MEDICATIONS: Restart Synjardy 12.06-998 mg TWO Tabs QAM  Switch Ozempic 2 mg weekly to Mounjaro 5 mg weekly Continue Basaglar 70 units daily   EDUCATION / INSTRUCTIONS: BG monitoring instructions: Patient is instructed to check his blood sugars 3 times a day, before meals . Call [facility] if: BG persistently < 70  I reviewed the Rule of 15 for the treatment of hypoglycemia in detail with the patient. Literature supplied.   2) Diabetic complications:  Eye: Does  have known diabetic retinopathy.  Neuro/ Feet: Does not have known diabetic peripheral neuropathy. Renal: Patient does not have known baseline CKD. He is not on an ACEI/ARB at present  3) Hypogonadism:  -Patient still not clear how much testosterone he draws out of the vial, he continues to tell me he takes half of a vial of testosterone, today he was shown by my CMA how to draw 0.25 mL of testosterone and a 3 mL syringe -He was evaluated by urology 01/2022 for elevated PSA by >  1.4 , no prostate nodules were detected -We again discussed the increased risk of prostate cancer and cardiovascular events with testosterone use and the importance of following  instructions -Testosterone pending today  Medication  Testosterone 200 mg/ML, 50 mg (0.25 mL) weekly    Follow-up in 6 months   I spent 36 minutes preparing to see the patient by review of recent labs, imaging and procedures, obtaining and reviewing separately obtained history, communicating with the patient, ordering medications, tests or procedures, and documenting clinical information in the EHR including the differential Dx, treatment, and any further evaluation and other management     Signed electronically by: Lyndle Herrlich, MD  [facility] Endocrinology  [facility] [address] [place], [place] [address] Phone: [phone] FAX: [phone]   CC: Sharlene Dory, DO [address] [place] [address] Phone: [phone]  Fax: [phone]    Return to Endocrinology clinic as below: No future appointments.

## 2022-11-01 NOTE — Patient Instructions (Addendum)
Restart Synjardy 12.06-998 mg TWO tablets every morning  Switch Ozempic to mounjaro 5 mg weekly  Continue Basaglar 70 units once daily       HOW TO TREAT LOW BLOOD SUGARS (Blood sugar LESS THAN 70 MG/DL) Please follow the RULE OF 15 for the treatment of hypoglycemia treatment (when your (blood sugars are less than 70 mg/dL)   STEP 1: Take 15 grams of carbohydrates when your blood sugar is low, which includes:  3-4 GLUCOSE TABS  OR 3-4 OZ OF JUICE OR REGULAR SODA OR ONE TUBE OF GLUCOSE GEL    STEP 2: RECHECK blood sugar in 15 MINUTES STEP 3: If your blood sugar is still low at the 15 minute recheck --> then, go back to STEP 1 and treat AGAIN with another 15 grams of carbohydrates.

## 2022-11-02 LAB — MICROALBUMIN / CREATININE URINE RATIO
Creatinine, Urine: 59 mg/dL (ref 20–320)
Microalb Creat Ratio: 5 mg/g{creat} (ref <30)
Microalb, Ur: 0.3 mg/dL

## 2022-11-04 ENCOUNTER — Other Ambulatory Visit (HOSPITAL_COMMUNITY): Payer: Self-pay

## 2022-11-04 ENCOUNTER — Encounter: Payer: Self-pay | Admitting: Internal Medicine

## 2022-11-04 ENCOUNTER — Telehealth: Payer: Self-pay

## 2022-11-04 ENCOUNTER — Other Ambulatory Visit (HOSPITAL_BASED_OUTPATIENT_CLINIC_OR_DEPARTMENT_OTHER): Payer: Self-pay

## 2022-11-04 LAB — BASIC METABOLIC PANEL
BUN: 15 mg/dL (ref 7–25)
CO2: 24 mmol/L (ref 20–32)
Calcium: 9.7 mg/dL (ref 8.6–10.3)
Chloride: 102 mmol/L (ref 98–110)
Creat: 0.92 mg/dL (ref 0.70–1.35)
Glucose, Bld: 279 mg/dL — ABNORMAL HIGH (ref 65–99)
Potassium: 4.5 mmol/L (ref 3.5–5.3)
Sodium: 136 mmol/L (ref 135–146)

## 2022-11-04 LAB — CBC
HCT: 48.4 % (ref 38.5–50.0)
Hemoglobin: 15.8 g/dL (ref 13.2–17.1)
MCH: 29 pg (ref 27.0–33.0)
MCHC: 32.6 g/dL (ref 32.0–36.0)
MCV: 89 fL (ref 80.0–100.0)
MPV: 11.2 fL (ref 7.5–12.5)
Platelets: 205 10*3/uL (ref 140–400)
RBC: 5.44 10*6/uL (ref 4.20–5.80)
RDW: 14.6 % (ref 11.0–15.0)
WBC: 7.6 10*3/uL (ref 3.8–10.8)

## 2022-11-04 LAB — LIPID PANEL
Cholesterol: 233 mg/dL — ABNORMAL HIGH (ref <200)
HDL: 33 mg/dL — ABNORMAL LOW (ref >=40)
Non-HDL Cholesterol (Calc): 200 mg/dL — ABNORMAL HIGH (ref <130)
Total CHOL/HDL Ratio: 7.1 (calc) — ABNORMAL HIGH (ref <5.0)
Triglycerides: 452 mg/dL — ABNORMAL HIGH (ref <150)

## 2022-11-04 LAB — TESTOSTERONE, TOTAL, LC/MS/MS: Testosterone, Total, LC-MS-MS: 848 ng/dL (ref 250–1100)

## 2022-11-04 LAB — PSA: PSA: 0.54 ng/mL (ref <=4.00)

## 2022-11-04 MED ORDER — FENOFIBRATE 200 MG PO CAPS
200.0000 mg | ORAL_CAPSULE | Freq: Every day | ORAL | 3 refills | Status: DC
Start: 2022-11-04 — End: 2023-10-28
  Filled 2022-11-04 – 2022-11-06 (×2): qty 90, 90d supply, fill #0
  Filled 2023-01-30: qty 90, 90d supply, fill #1
  Filled 2023-05-28 (×2): qty 90, 90d supply, fill #2
  Filled 2023-08-15: qty 64, 64d supply, fill #3
  Filled ????-??-?? (×2): fill #2

## 2022-11-04 MED ORDER — ROSUVASTATIN CALCIUM 40 MG PO TABS
40.0000 mg | ORAL_TABLET | Freq: Every day | ORAL | 3 refills | Status: DC
  Filled 2022-11-04 – 2022-11-06 (×2): qty 90, 90d supply, fill #0
  Filled 2023-01-30: qty 90, 90d supply, fill #1
  Filled 2023-05-22: qty 90, 90d supply, fill #2
  Filled 2023-08-15: qty 64, 64d supply, fill #3

## 2022-11-04 NOTE — Telephone Encounter (Signed)
Pharmacy Patient Advocate Encounter   Received notification from Pt Calls Messages that prior authorization for William [name] is required/requested.   Per test claim: PA required; PA submitted to [facility] via CoverMyMeds Key/confirmation #/[id number] Status is pending

## 2022-11-04 NOTE — Telephone Encounter (Signed)
Pharmacy Patient Advocate Encounter  Received notification from [facility] federal empl.  that Prior Authorization for Synjardy 12.06-998 has been APPROVED from 10/05/22 to 11/04/23 Fill already in progress @ [facility]- $10 copay   PA #/Case ID/Reference #: PA Case ID #: [id number]

## 2022-11-05 ENCOUNTER — Ambulatory Visit: Payer: Federal, State, Local not specified - PPO | Admitting: Internal Medicine

## 2022-11-05 ENCOUNTER — Other Ambulatory Visit (HOSPITAL_BASED_OUTPATIENT_CLINIC_OR_DEPARTMENT_OTHER): Payer: Self-pay

## 2022-11-05 DIAGNOSIS — M2392 Unspecified internal derangement of left knee: Secondary | ICD-10-CM | POA: Diagnosis not present

## 2022-11-05 DIAGNOSIS — M25562 Pain in left knee: Secondary | ICD-10-CM | POA: Diagnosis not present

## 2022-11-06 ENCOUNTER — Other Ambulatory Visit (HOSPITAL_BASED_OUTPATIENT_CLINIC_OR_DEPARTMENT_OTHER): Payer: Self-pay

## 2022-11-08 NOTE — Telephone Encounter (Signed)
Mr Nadeem is aware

## 2022-11-12 DIAGNOSIS — M2392 Unspecified internal derangement of left knee: Secondary | ICD-10-CM | POA: Diagnosis not present

## 2022-11-18 DIAGNOSIS — I4510 Unspecified right bundle-branch block: Secondary | ICD-10-CM | POA: Diagnosis not present

## 2022-11-18 DIAGNOSIS — E785 Hyperlipidemia, unspecified: Secondary | ICD-10-CM | POA: Diagnosis not present

## 2022-11-18 DIAGNOSIS — F332 Major depressive disorder, recurrent severe without psychotic features: Secondary | ICD-10-CM | POA: Diagnosis not present

## 2022-11-18 DIAGNOSIS — T887XXA Unspecified adverse effect of drug or medicament, initial encounter: Secondary | ICD-10-CM | POA: Diagnosis not present

## 2022-11-18 DIAGNOSIS — R4182 Altered mental status, unspecified: Secondary | ICD-10-CM | POA: Diagnosis not present

## 2022-11-18 DIAGNOSIS — E875 Hyperkalemia: Secondary | ICD-10-CM | POA: Diagnosis not present

## 2022-11-18 DIAGNOSIS — F22 Delusional disorders: Secondary | ICD-10-CM | POA: Diagnosis not present

## 2022-11-18 DIAGNOSIS — R0602 Shortness of breath: Secondary | ICD-10-CM | POA: Diagnosis not present

## 2022-11-18 DIAGNOSIS — A419 Sepsis, unspecified organism: Secondary | ICD-10-CM | POA: Diagnosis not present

## 2022-11-18 DIAGNOSIS — T424X2A Poisoning by benzodiazepines, intentional self-harm, initial encounter: Secondary | ICD-10-CM | POA: Diagnosis not present

## 2022-11-18 DIAGNOSIS — F061 Catatonic disorder due to known physiological condition: Secondary | ICD-10-CM | POA: Diagnosis not present

## 2022-11-18 DIAGNOSIS — E11649 Type 2 diabetes mellitus with hypoglycemia without coma: Secondary | ICD-10-CM | POA: Diagnosis not present

## 2022-11-18 DIAGNOSIS — R55 Syncope and collapse: Secondary | ICD-10-CM | POA: Diagnosis not present

## 2022-11-18 DIAGNOSIS — Z4682 Encounter for fitting and adjustment of non-vascular catheter: Secondary | ICD-10-CM | POA: Diagnosis not present

## 2022-11-18 DIAGNOSIS — J9601 Acute respiratory failure with hypoxia: Secondary | ICD-10-CM | POA: Diagnosis not present

## 2022-11-18 DIAGNOSIS — G928 Other toxic encephalopathy: Secondary | ICD-10-CM | POA: Diagnosis not present

## 2022-11-18 DIAGNOSIS — Z794 Long term (current) use of insulin: Secondary | ICD-10-CM | POA: Diagnosis not present

## 2022-11-18 DIAGNOSIS — I2699 Other pulmonary embolism without acute cor pulmonale: Secondary | ICD-10-CM | POA: Diagnosis not present

## 2022-11-18 DIAGNOSIS — E870 Hyperosmolality and hypernatremia: Secondary | ICD-10-CM | POA: Diagnosis not present

## 2022-11-18 DIAGNOSIS — R4020 Unspecified coma: Secondary | ICD-10-CM | POA: Diagnosis not present

## 2022-11-18 DIAGNOSIS — R402431 Glasgow coma scale score 3-8, in the field [EMT or ambulance]: Secondary | ICD-10-CM | POA: Diagnosis not present

## 2022-11-18 DIAGNOSIS — E1165 Type 2 diabetes mellitus with hyperglycemia: Secondary | ICD-10-CM | POA: Diagnosis not present

## 2022-11-18 DIAGNOSIS — I119 Hypertensive heart disease without heart failure: Secondary | ICD-10-CM | POA: Diagnosis not present

## 2022-11-18 DIAGNOSIS — R000 Tachycardia, unspecified: Secondary | ICD-10-CM | POA: Diagnosis not present

## 2022-11-19 ENCOUNTER — Other Ambulatory Visit (HOSPITAL_BASED_OUTPATIENT_CLINIC_OR_DEPARTMENT_OTHER): Payer: Self-pay

## 2022-11-19 DIAGNOSIS — R4182 Altered mental status, unspecified: Secondary | ICD-10-CM | POA: Diagnosis not present

## 2022-11-19 DIAGNOSIS — I517 Cardiomegaly: Secondary | ICD-10-CM | POA: Diagnosis not present

## 2022-11-19 DIAGNOSIS — I50810 Right heart failure, unspecified: Secondary | ICD-10-CM | POA: Diagnosis not present

## 2022-11-20 DIAGNOSIS — G929 Unspecified toxic encephalopathy: Secondary | ICD-10-CM | POA: Diagnosis not present

## 2022-11-21 DIAGNOSIS — G929 Unspecified toxic encephalopathy: Secondary | ICD-10-CM | POA: Diagnosis not present

## 2022-11-22 DIAGNOSIS — G929 Unspecified toxic encephalopathy: Secondary | ICD-10-CM | POA: Diagnosis not present

## 2022-11-22 DIAGNOSIS — R4182 Altered mental status, unspecified: Secondary | ICD-10-CM | POA: Diagnosis not present

## 2022-11-22 DIAGNOSIS — A4101 Sepsis due to Methicillin susceptible Staphylococcus aureus: Secondary | ICD-10-CM | POA: Diagnosis not present

## 2022-11-22 DIAGNOSIS — R4189 Other symptoms and signs involving cognitive functions and awareness: Secondary | ICD-10-CM | POA: Diagnosis not present

## 2022-11-22 DIAGNOSIS — G9341 Metabolic encephalopathy: Secondary | ICD-10-CM | POA: Diagnosis not present

## 2022-11-23 DIAGNOSIS — G9341 Metabolic encephalopathy: Secondary | ICD-10-CM | POA: Diagnosis not present

## 2022-11-23 DIAGNOSIS — G929 Unspecified toxic encephalopathy: Secondary | ICD-10-CM | POA: Diagnosis not present

## 2022-11-23 DIAGNOSIS — R4182 Altered mental status, unspecified: Secondary | ICD-10-CM | POA: Diagnosis not present

## 2022-11-23 DIAGNOSIS — R4189 Other symptoms and signs involving cognitive functions and awareness: Secondary | ICD-10-CM | POA: Diagnosis not present

## 2022-11-23 DIAGNOSIS — A4101 Sepsis due to Methicillin susceptible Staphylococcus aureus: Secondary | ICD-10-CM | POA: Diagnosis not present

## 2022-11-24 DIAGNOSIS — J9811 Atelectasis: Secondary | ICD-10-CM | POA: Diagnosis not present

## 2022-11-24 DIAGNOSIS — R4189 Other symptoms and signs involving cognitive functions and awareness: Secondary | ICD-10-CM | POA: Diagnosis not present

## 2022-11-24 DIAGNOSIS — J9690 Respiratory failure, unspecified, unspecified whether with hypoxia or hypercapnia: Secondary | ICD-10-CM | POA: Diagnosis not present

## 2022-11-24 DIAGNOSIS — G929 Unspecified toxic encephalopathy: Secondary | ICD-10-CM | POA: Diagnosis not present

## 2022-11-24 DIAGNOSIS — R4182 Altered mental status, unspecified: Secondary | ICD-10-CM | POA: Diagnosis not present

## 2022-11-24 DIAGNOSIS — G9341 Metabolic encephalopathy: Secondary | ICD-10-CM | POA: Diagnosis not present

## 2022-11-24 DIAGNOSIS — Z4682 Encounter for fitting and adjustment of non-vascular catheter: Secondary | ICD-10-CM | POA: Diagnosis not present

## 2022-11-24 DIAGNOSIS — J9600 Acute respiratory failure, unspecified whether with hypoxia or hypercapnia: Secondary | ICD-10-CM | POA: Diagnosis not present

## 2022-11-24 DIAGNOSIS — A4101 Sepsis due to Methicillin susceptible Staphylococcus aureus: Secondary | ICD-10-CM | POA: Diagnosis not present

## 2022-11-25 DIAGNOSIS — G929 Unspecified toxic encephalopathy: Secondary | ICD-10-CM | POA: Diagnosis not present

## 2022-11-26 DIAGNOSIS — R4182 Altered mental status, unspecified: Secondary | ICD-10-CM | POA: Diagnosis not present

## 2022-11-26 DIAGNOSIS — G929 Unspecified toxic encephalopathy: Secondary | ICD-10-CM | POA: Diagnosis not present

## 2022-11-26 DIAGNOSIS — J9811 Atelectasis: Secondary | ICD-10-CM | POA: Diagnosis not present

## 2022-11-26 DIAGNOSIS — Z4682 Encounter for fitting and adjustment of non-vascular catheter: Secondary | ICD-10-CM | POA: Diagnosis not present

## 2022-11-27 DIAGNOSIS — R490 Dysphonia: Secondary | ICD-10-CM | POA: Diagnosis not present

## 2022-11-27 DIAGNOSIS — J3802 Paralysis of vocal cords and larynx, bilateral: Secondary | ICD-10-CM | POA: Diagnosis not present

## 2022-11-27 DIAGNOSIS — R4182 Altered mental status, unspecified: Secondary | ICD-10-CM | POA: Diagnosis not present

## 2022-11-28 DIAGNOSIS — R4182 Altered mental status, unspecified: Secondary | ICD-10-CM | POA: Diagnosis not present

## 2022-11-28 DIAGNOSIS — I4510 Unspecified right bundle-branch block: Secondary | ICD-10-CM | POA: Diagnosis not present

## 2022-11-28 DIAGNOSIS — J9811 Atelectasis: Secondary | ICD-10-CM | POA: Diagnosis not present

## 2022-11-28 DIAGNOSIS — R079 Chest pain, unspecified: Secondary | ICD-10-CM | POA: Diagnosis not present

## 2022-11-28 DIAGNOSIS — R1310 Dysphagia, unspecified: Secondary | ICD-10-CM | POA: Diagnosis not present

## 2022-11-28 DIAGNOSIS — F32A Depression, unspecified: Secondary | ICD-10-CM | POA: Diagnosis not present

## 2022-11-29 DIAGNOSIS — F419 Anxiety disorder, unspecified: Secondary | ICD-10-CM | POA: Diagnosis not present

## 2022-11-29 DIAGNOSIS — I10 Essential (primary) hypertension: Secondary | ICD-10-CM | POA: Diagnosis not present

## 2022-11-29 DIAGNOSIS — F32A Depression, unspecified: Secondary | ICD-10-CM | POA: Diagnosis not present

## 2022-11-29 DIAGNOSIS — I2699 Other pulmonary embolism without acute cor pulmonale: Secondary | ICD-10-CM | POA: Diagnosis not present

## 2022-11-29 DIAGNOSIS — R0902 Hypoxemia: Secondary | ICD-10-CM | POA: Diagnosis not present

## 2022-11-29 DIAGNOSIS — J384 Edema of larynx: Secondary | ICD-10-CM | POA: Diagnosis not present

## 2022-11-29 DIAGNOSIS — I4510 Unspecified right bundle-branch block: Secondary | ICD-10-CM | POA: Diagnosis not present

## 2022-11-29 DIAGNOSIS — R4182 Altered mental status, unspecified: Secondary | ICD-10-CM | POA: Diagnosis not present

## 2022-11-29 DIAGNOSIS — I2510 Atherosclerotic heart disease of native coronary artery without angina pectoris: Secondary | ICD-10-CM | POA: Diagnosis not present

## 2022-11-29 DIAGNOSIS — J90 Pleural effusion, not elsewhere classified: Secondary | ICD-10-CM | POA: Diagnosis not present

## 2022-11-30 DIAGNOSIS — J9602 Acute respiratory failure with hypercapnia: Secondary | ICD-10-CM | POA: Diagnosis not present

## 2022-11-30 DIAGNOSIS — Z7409 Other reduced mobility: Secondary | ICD-10-CM | POA: Diagnosis not present

## 2022-11-30 DIAGNOSIS — I2699 Other pulmonary embolism without acute cor pulmonale: Secondary | ICD-10-CM | POA: Diagnosis not present

## 2022-11-30 DIAGNOSIS — J3802 Paralysis of vocal cords and larynx, bilateral: Secondary | ICD-10-CM | POA: Diagnosis not present

## 2022-11-30 DIAGNOSIS — T1491XA Suicide attempt, initial encounter: Secondary | ICD-10-CM | POA: Diagnosis not present

## 2022-12-01 DIAGNOSIS — T424X2A Poisoning by benzodiazepines, intentional self-harm, initial encounter: Secondary | ICD-10-CM | POA: Diagnosis not present

## 2022-12-01 DIAGNOSIS — F32A Depression, unspecified: Secondary | ICD-10-CM | POA: Diagnosis not present

## 2022-12-01 DIAGNOSIS — F419 Anxiety disorder, unspecified: Secondary | ICD-10-CM | POA: Diagnosis not present

## 2022-12-01 DIAGNOSIS — R4182 Altered mental status, unspecified: Secondary | ICD-10-CM | POA: Diagnosis not present

## 2022-12-02 DIAGNOSIS — R4182 Altered mental status, unspecified: Secondary | ICD-10-CM | POA: Diagnosis not present

## 2022-12-02 DIAGNOSIS — I4510 Unspecified right bundle-branch block: Secondary | ICD-10-CM | POA: Diagnosis not present

## 2022-12-02 DIAGNOSIS — F332 Major depressive disorder, recurrent severe without psychotic features: Secondary | ICD-10-CM | POA: Diagnosis not present

## 2022-12-03 DIAGNOSIS — R4182 Altered mental status, unspecified: Secondary | ICD-10-CM | POA: Diagnosis not present

## 2022-12-03 DIAGNOSIS — F332 Major depressive disorder, recurrent severe without psychotic features: Secondary | ICD-10-CM | POA: Diagnosis not present

## 2022-12-04 DIAGNOSIS — F322 Major depressive disorder, single episode, severe without psychotic features: Secondary | ICD-10-CM | POA: Diagnosis not present

## 2022-12-04 DIAGNOSIS — I4510 Unspecified right bundle-branch block: Secondary | ICD-10-CM | POA: Diagnosis not present

## 2022-12-04 DIAGNOSIS — E162 Hypoglycemia, unspecified: Secondary | ICD-10-CM | POA: Diagnosis not present

## 2022-12-04 DIAGNOSIS — E1165 Type 2 diabetes mellitus with hyperglycemia: Secondary | ICD-10-CM | POA: Diagnosis not present

## 2022-12-04 DIAGNOSIS — Z794 Long term (current) use of insulin: Secondary | ICD-10-CM | POA: Diagnosis not present

## 2022-12-05 DIAGNOSIS — F322 Major depressive disorder, single episode, severe without psychotic features: Secondary | ICD-10-CM | POA: Diagnosis not present

## 2022-12-06 ENCOUNTER — Other Ambulatory Visit: Payer: Self-pay

## 2022-12-06 ENCOUNTER — Other Ambulatory Visit (HOSPITAL_BASED_OUTPATIENT_CLINIC_OR_DEPARTMENT_OTHER): Payer: Self-pay

## 2022-12-06 MED ORDER — LISINOPRIL 10 MG PO TABS
10.0000 mg | ORAL_TABLET | Freq: Every day | ORAL | 0 refills | Status: DC
  Filled 2022-12-06: qty 30, 30d supply, fill #0

## 2022-12-06 MED ORDER — BENZONATATE 100 MG PO CAPS
100.0000 mg | ORAL_CAPSULE | Freq: Three times a day (TID) | ORAL | 0 refills | Status: DC | PRN
  Filled 2022-12-06: qty 20, 7d supply, fill #0

## 2022-12-06 MED ORDER — ALBUTEROL SULFATE HFA 108 (90 BASE) MCG/ACT IN AERS
2.0000 | INHALATION_SPRAY | Freq: Four times a day (QID) | RESPIRATORY_TRACT | 0 refills | Status: DC | PRN
  Filled 2022-12-06: qty 6.7, 25d supply, fill #0

## 2022-12-06 MED ORDER — TRAZODONE HCL 50 MG PO TABS
50.0000 mg | ORAL_TABLET | Freq: Every evening | ORAL | 0 refills | Status: DC
  Filled 2022-12-06: qty 30, 30d supply, fill #0

## 2022-12-06 MED ORDER — ELIQUIS 5 MG PO TABS
5.0000 mg | ORAL_TABLET | Freq: Two times a day (BID) | ORAL | 0 refills | Status: DC
  Filled 2022-12-06: qty 60, 30d supply, fill #0

## 2022-12-06 MED ORDER — SERTRALINE HCL 50 MG PO TABS
50.0000 mg | ORAL_TABLET | Freq: Every day | ORAL | 0 refills | Status: DC
  Filled 2022-12-06: qty 30, 30d supply, fill #0

## 2022-12-06 MED ORDER — CARVEDILOL 12.5 MG PO TABS
12.5000 mg | ORAL_TABLET | Freq: Two times a day (BID) | ORAL | 0 refills | Status: DC
  Filled 2022-12-06: qty 60, 30d supply, fill #0

## 2022-12-06 MED ORDER — MELATONIN 3 MG PO TABS
3.0000 mg | ORAL_TABLET | Freq: Every evening | ORAL | 0 refills | Status: DC
  Filled 2022-12-06: qty 60, 60d supply, fill #0

## 2022-12-06 MED ORDER — IPRATROPIUM-ALBUTEROL 0.5-2.5 (3) MG/3ML IN SOLN
3.0000 mL | Freq: Four times a day (QID) | RESPIRATORY_TRACT | 0 refills | Status: DC
  Filled 2022-12-06: qty 180, 15d supply, fill #0

## 2022-12-06 MED ORDER — BASAGLAR KWIKPEN 100 UNIT/ML ~~LOC~~ SOPN
PEN_INJECTOR | SUBCUTANEOUS | 0 refills | Status: DC
  Filled 2023-01-30: qty 45, 90d supply, fill #0

## 2022-12-06 MED ORDER — FAMOTIDINE 20 MG PO TABS
20.0000 mg | ORAL_TABLET | Freq: Two times a day (BID) | ORAL | 0 refills | Status: DC
  Filled 2022-12-06: qty 60, 30d supply, fill #0

## 2022-12-08 ENCOUNTER — Encounter (HOSPITAL_COMMUNITY): Payer: Self-pay

## 2022-12-09 ENCOUNTER — Telehealth (HOSPITAL_COMMUNITY): Payer: Self-pay

## 2022-12-09 NOTE — Telephone Encounter (Signed)
Patient sent a message via mychart stating that he needs therapy and a visit with Dr Lolly Mustache after attempting suicide. Patient is okay now, he has been discharged and is staying with a friend. Patient has been scheduled for 11/11 with Dr. Lolly Mustache

## 2022-12-10 DIAGNOSIS — J40 Bronchitis, not specified as acute or chronic: Secondary | ICD-10-CM | POA: Diagnosis not present

## 2022-12-10 DIAGNOSIS — Z7901 Long term (current) use of anticoagulants: Secondary | ICD-10-CM | POA: Diagnosis not present

## 2022-12-10 DIAGNOSIS — R0602 Shortness of breath: Secondary | ICD-10-CM | POA: Diagnosis not present

## 2022-12-10 DIAGNOSIS — Z7985 Long-term (current) use of injectable non-insulin antidiabetic drugs: Secondary | ICD-10-CM | POA: Diagnosis not present

## 2022-12-10 DIAGNOSIS — R062 Wheezing: Secondary | ICD-10-CM | POA: Diagnosis not present

## 2022-12-10 DIAGNOSIS — I2699 Other pulmonary embolism without acute cor pulmonale: Secondary | ICD-10-CM | POA: Diagnosis not present

## 2022-12-10 DIAGNOSIS — R058 Other specified cough: Secondary | ICD-10-CM | POA: Diagnosis not present

## 2022-12-10 DIAGNOSIS — R490 Dysphonia: Secondary | ICD-10-CM | POA: Diagnosis not present

## 2022-12-10 DIAGNOSIS — R918 Other nonspecific abnormal finding of lung field: Secondary | ICD-10-CM | POA: Diagnosis not present

## 2022-12-11 ENCOUNTER — Other Ambulatory Visit: Payer: Self-pay

## 2022-12-11 ENCOUNTER — Other Ambulatory Visit: Payer: Self-pay | Admitting: Family Medicine

## 2022-12-11 ENCOUNTER — Encounter: Payer: Self-pay | Admitting: Podiatry

## 2022-12-11 ENCOUNTER — Other Ambulatory Visit (HOSPITAL_BASED_OUTPATIENT_CLINIC_OR_DEPARTMENT_OTHER): Payer: Self-pay

## 2022-12-11 ENCOUNTER — Ambulatory Visit: Payer: Federal, State, Local not specified - PPO | Admitting: Podiatry

## 2022-12-11 ENCOUNTER — Ambulatory Visit: Payer: Federal, State, Local not specified - PPO | Admitting: Family Medicine

## 2022-12-11 ENCOUNTER — Encounter: Payer: Self-pay | Admitting: Family Medicine

## 2022-12-11 VITALS — BP 134/82 | HR 100 | Temp 98.0°F | Resp 16 | Ht 73.0 in | Wt 200.0 lb

## 2022-12-11 DIAGNOSIS — J029 Acute pharyngitis, unspecified: Secondary | ICD-10-CM

## 2022-12-11 DIAGNOSIS — F419 Anxiety disorder, unspecified: Secondary | ICD-10-CM | POA: Diagnosis not present

## 2022-12-11 DIAGNOSIS — M7751 Other enthesopathy of right foot: Secondary | ICD-10-CM | POA: Diagnosis not present

## 2022-12-11 DIAGNOSIS — Z794 Long term (current) use of insulin: Secondary | ICD-10-CM

## 2022-12-11 DIAGNOSIS — F4310 Post-traumatic stress disorder, unspecified: Secondary | ICD-10-CM | POA: Diagnosis not present

## 2022-12-11 DIAGNOSIS — T424X2A Poisoning by benzodiazepines, intentional self-harm, initial encounter: Secondary | ICD-10-CM | POA: Diagnosis not present

## 2022-12-11 DIAGNOSIS — R0602 Shortness of breath: Secondary | ICD-10-CM | POA: Diagnosis not present

## 2022-12-11 DIAGNOSIS — M7752 Other enthesopathy of left foot: Secondary | ICD-10-CM

## 2022-12-11 DIAGNOSIS — E1165 Type 2 diabetes mellitus with hyperglycemia: Secondary | ICD-10-CM

## 2022-12-11 DIAGNOSIS — R490 Dysphonia: Secondary | ICD-10-CM | POA: Diagnosis not present

## 2022-12-11 DIAGNOSIS — F32A Depression, unspecified: Secondary | ICD-10-CM

## 2022-12-11 DIAGNOSIS — R918 Other nonspecific abnormal finding of lung field: Secondary | ICD-10-CM | POA: Diagnosis not present

## 2022-12-11 MED ORDER — TUBING/WING TIP MISC: 8 refills | Status: AC

## 2022-12-11 MED ORDER — CHLORHEXIDINE GLUCONATE 0.12 % MT SOLN
15.0000 mL | OROMUCOSAL | 3 refills | Status: DC
  Filled 2022-12-11: qty 473, 30d supply, fill #0
  Filled 2022-12-13: qty 473, 30d supply, fill #1
  Filled 2023-01-24: qty 473, 30d supply, fill #2
  Filled 2023-03-10 – 2023-05-22 (×2): qty 473, 30d supply, fill #3

## 2022-12-11 MED ORDER — IPRATROPIUM BROMIDE 0.02 % IN SOLN: 0.5000 mg | Freq: Four times a day (QID) | RESPIRATORY_TRACT | 12 refills | Status: AC

## 2022-12-11 MED ORDER — TRIAMCINOLONE ACETONIDE 10 MG/ML IJ SUSP
10.0000 mg | Freq: Once | INTRAMUSCULAR | Status: AC
  Administered 2022-12-11: 10 mg via INTRA_ARTICULAR
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
## 2022-12-11 NOTE — Patient Instructions (Addendum)
The throat culture should return by the end of the week.   I am glad you are getting the help you need.   Consider throat lozenges, salt water gargles and an air humidifier for symptomatic care.   Let us know if you need anything.
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
## 2022-12-13 ENCOUNTER — Other Ambulatory Visit (HOSPITAL_BASED_OUTPATIENT_CLINIC_OR_DEPARTMENT_OTHER): Payer: Self-pay

## 2022-12-13 ENCOUNTER — Telehealth: Payer: Self-pay | Admitting: Family Medicine

## 2022-12-13 LAB — CULTURE, GROUP A STREP
Micro Number: 15694356
SPECIMEN QUALITY:: ADEQUATE

## 2022-12-13 NOTE — Telephone Encounter (Signed)
Sent the pt message letting him know it is [facility].

## 2022-12-13 NOTE — Telephone Encounter (Signed)
Pt called and stated that Dr. Carmelia Roller mentioned to him that there is a medical supply store nearby with durable medical equipment. Pt Is unsure of the name and requested for pcp to call him or send a message of the name. Please advise pt.

## 2022-12-16 ENCOUNTER — Telehealth (HOSPITAL_BASED_OUTPATIENT_CLINIC_OR_DEPARTMENT_OTHER): Payer: Federal, State, Local not specified - PPO | Admitting: Psychiatry

## 2022-12-16 ENCOUNTER — Encounter (HOSPITAL_COMMUNITY): Payer: Self-pay | Admitting: Psychiatry

## 2022-12-16 VITALS — Wt 200.0 lb

## 2022-12-16 DIAGNOSIS — F32A Depression, unspecified: Secondary | ICD-10-CM

## 2022-12-16 DIAGNOSIS — F4312 Post-traumatic stress disorder, chronic: Secondary | ICD-10-CM

## 2022-12-16 DIAGNOSIS — F419 Anxiety disorder, unspecified: Secondary | ICD-10-CM
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
## 2023-01-16 DIAGNOSIS — I10 Essential (primary) hypertension: Secondary | ICD-10-CM | POA: Diagnosis not present

## 2023-01-16 DIAGNOSIS — Z7985 Long-term (current) use of injectable non-insulin antidiabetic drugs: Secondary | ICD-10-CM | POA: Diagnosis not present

## 2023-01-16 DIAGNOSIS — Z794 Long term (current) use of insulin: Secondary | ICD-10-CM | POA: Diagnosis not present

## 2023-01-16 DIAGNOSIS — F419 Anxiety disorder, unspecified: Secondary | ICD-10-CM | POA: Diagnosis not present

## 2023-01-16 DIAGNOSIS — Z9151 Personal history of suicidal behavior: Secondary | ICD-10-CM | POA: Diagnosis not present

## 2023-01-16 DIAGNOSIS — Z760 Encounter for issue of repeat prescription: Secondary | ICD-10-CM | POA: Diagnosis not present

## 2023-01-16 DIAGNOSIS — F32A Depression, unspecified: Secondary | ICD-10-CM | POA: Diagnosis not present

## 2023-01-16 DIAGNOSIS — E119 Type 2 diabetes mellitus without complications: Secondary | ICD-10-CM | POA: Diagnosis not present

## 2023-01-16 DIAGNOSIS — R410 Disorientation, unspecified: Secondary | ICD-10-CM | POA: Diagnosis not present

## 2023-01-16 DIAGNOSIS — Z7984 Long term (current) use of oral hypoglycemic drugs: Secondary | ICD-10-CM | POA: Diagnosis not present

## 2023-01-16 DIAGNOSIS — Z79899 Other long term (current) drug therapy: Secondary | ICD-10-CM | POA: Diagnosis not present

## 2023-01-17 ENCOUNTER — Other Ambulatory Visit: Payer: Self-pay

## 2023-01-17 ENCOUNTER — Encounter: Payer: Self-pay | Admitting: Family

## 2023-01-17 ENCOUNTER — Ambulatory Visit: Payer: Federal, State, Local not specified - PPO | Admitting: Family

## 2023-01-17 ENCOUNTER — Other Ambulatory Visit (HOSPITAL_BASED_OUTPATIENT_CLINIC_OR_DEPARTMENT_OTHER): Payer: Self-pay

## 2023-01-17 VITALS — BP 134/78 | HR 91 | Ht 73.0 in

## 2023-01-17 DIAGNOSIS — F32A Depression, unspecified: Secondary | ICD-10-CM | POA: Diagnosis not present

## 2023-01-17 DIAGNOSIS — I10 Essential (primary) hypertension: Secondary | ICD-10-CM | POA: Diagnosis not present

## 2023-01-17 DIAGNOSIS — F419 Anxiety disorder, unspecified: Secondary | ICD-10-CM | POA: Diagnosis not present

## 2023-01-17 MED ORDER — CARVEDILOL 12.5 MG PO TABS
12.5000 mg | ORAL_TABLET | Freq: Two times a day (BID) | ORAL | 0 refills | Status: DC
  Filled 2023-01-17 – 2023-01-30 (×3): qty 28, 14d supply, fill #0

## 2023-01-17 MED ORDER — LAMOTRIGINE 100 MG PO TABS
100.0000 mg | ORAL_TABLET | Freq: Every day | ORAL | 0 refills | Status: DC
  Filled 2023-01-17: qty 14, 14d supply, fill #0

## 2023-01-17 MED ORDER — APIXABAN 5 MG PO TABS
5.0000 mg | ORAL_TABLET | Freq: Two times a day (BID) | ORAL | 0 refills | Status: DC
  Filled 2023-01-17 – 2023-02-05 (×5): qty 28, 14d supply, fill #0

## 2023-01-17 MED ORDER — SILVER SULFADIAZINE 1 % EX CREA
1.0000 | TOPICAL_CREAM | Freq: Two times a day (BID) | CUTANEOUS | 0 refills | Status: DC
  Filled 2023-01-17: qty 50, 25d supply, fill #0

## 2023-01-17 MED ORDER — FAMOTIDINE 20 MG PO TABS
20.0000 mg | ORAL_TABLET | Freq: Two times a day (BID) | ORAL | 0 refills | Status: DC
  Filled 2023-01-17: qty 60, 30d supply, fill #0

## 2023-01-17 MED ORDER — TAMSULOSIN HCL 0.4 MG PO CAPS
0.4000 mg | ORAL_CAPSULE | Freq: Every day | ORAL | 0 refills | Status: DC
  Filled 2023-01-17 – 2023-01-30 (×3): qty 14, 14d supply, fill #0

## 2023-01-17 MED ORDER — QUETIAPINE FUMARATE 200 MG PO TABS
200.0000 mg | ORAL_TABLET | Freq: Every day | ORAL | 0 refills | Status: DC
  Filled 2023-01-17 (×2): qty 14, 14d supply, fill #0

## 2023-01-17 MED ORDER — LISINOPRIL 10 MG PO TABS
10.0000 mg | ORAL_TABLET | Freq: Every day | ORAL | 0 refills | Status: DC
  Filled 2023-01-17: qty 14, 14d supply, fill #0

## 2023-01-17 NOTE — Patient Instructions (Signed)
Please send a message to both Dr. Carmelia Roller and Dr. Lolly Mustache to discuss long term management of your medications.

## 2023-01-17 NOTE — Progress Notes (Signed)
William [name] is a 62 y.o. male with the following history as recorded in EpicCare:  Patient Active Problem List   Diagnosis Date Noted   PTSD (post-traumatic stress disorder) 12/11/2022   Suicide attempt by benzodiazepine overdose (HCC) 12/11/2022   Head injury 06/03/2022   Rising PSA level 01/09/2022   Lower urinary tract symptoms 01/09/2022   Myofascial pain dysfunction syndrome 09/19/2021   Capsulitis of right shoulder 05/22/2021   Cervical radiculopathy 01/09/2021   Subacromial bursitis of right shoulder joint 01/09/2021   Presbycusis of both ears 11/13/2018   Need for shingles vaccine 10/28/2018   Need for immunization against influenza 10/31/2017   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) 08/12/2017   Prostate cancer screening 07/04/2017   Anxiety and depression 12/11/2016   Class 1 obesity with serious comorbidity and body mass index (BMI) of 31.0 to 31.9 in adult 03/04/2016   Visit for preventive health examination 10/24/2015   Erectile dysfunction 01/31/2013   Hypogonadism in male 07/21/2012   Type 2 diabetes mellitus with hyperglycemia, with long-term current use of insulin (HCC) 11/24/2009   Hyperlipemia 11/24/2009   Essential hypertension 11/24/2009    Current Outpatient Medications  Medication Sig Dispense Refill   albuterol (VENTOLIN HFA) 108 (90 Base) MCG/ACT inhaler Inhale 2 puffs into the lungs every 6 (six) hours as needed for wheezing 6.7 g 0   chlorhexidine (PERIOGARD) 0.12 % solution RINSE MOUTH WITH (1 CAPFUL) FOR 30 SECONDS AM AND PM AFTER TOOTHBRUSHING. SPIT AFTER RINSING, DO NOT SWALLOW 473 mL 3   Continuous Glucose Sensor (FREESTYLE LIBRE 3 SENSOR) MISC Use as directed every 14 days 6 each 3   Empagliflozin-metFORMIN HCl (SYNJARDY) 12.06-998 MG TABS Take 1 tablet by mouth 2 (two) times daily. 180 tablet 3   fenofibrate micronized (LOFIBRA) 200 MG capsule Take 1 capsule  (200 mg total) by mouth daily before breakfast. 90 capsule 3   Insulin Glargine (BASAGLAR KWIKPEN) 100 UNIT/ML Inject 70 Units into the skin daily. 75 mL 3   Insulin Glargine (BASAGLAR KWIKPEN) 100 UNIT/ML Inject into the skin. 45 mL 0   Insulin Pen Needle 32G X 4 MM MISC Use as directed to inject insulin in the afternoon 100 each 3   melatonin 3 MG TABS tablet Take 1 tablet (3 mg total) by mouth every evening. (Patient taking differently: Take 3 mg by mouth every evening. 10 mg) 60 tablet 0   rosuvastatin (CRESTOR) 40 MG tablet Take 1 tablet (40 mg total) by mouth daily. 90 tablet 3   sildenafil (VIAGRA) 100 MG tablet Take 0.5-1 tablets (50-100 mg total) by mouth daily as needed for erectile dysfunction. *NEED APPT FOR FURTHER REFILLS* 90 tablet 2   testosterone enanthate (DELATESTRYL) 200 MG/ML injection Inject 0.25 mLs (50 mg total) into the muscle every 14 (fourteen) days. 5 mL 5   apixaban (ELIQUIS) 5 MG TABS tablet Take 1 tablet (5 mg total) by mouth 2 (two) times daily. 28 tablet 0   carvedilol (COREG) 12.5 MG tablet Take 1 tablet (12.5 mg total) by mouth in the morning and 1 tablet (12.5 mg total) in the evening. Take with meals. 28 tablet 0   diclofenac (VOLTAREN) 75 MG EC tablet Take 1 tablet (75 mg total) by mouth 2 (two) times daily. (Patient not taking: Reported on 01/17/2023) 50 tablet 2   famotidine (PEPCID) 20 MG tablet Take 1 tablet (20 mg total) by mouth 2 (two) times daily. 60  tablet 0   fluticasone (FLONASE) 50 MCG/ACT nasal spray Place 2 sprays into both nostrils daily. (Patient not taking: Reported on 01/17/2023) 16 g 1   ipratropium (ATROVENT) 0.02 % nebulizer solution Take 2.5 mLs (0.5 mg total) by nebulization 4 (four) times daily. (Patient not taking: Reported on 01/17/2023) 75 mL 12   ipratropium-albuterol (DUONEB) 0.5-2.5 (3) MG/3ML SOLN Take 3 mL by nebulization every 6 (six) hours as needed for wheezing. (Patient not taking: Reported on 01/17/2023) 180 mL 0   lamoTRIgine  (LAMICTAL) 100 MG tablet Take 1 tablet (100 mg total) by mouth daily. 14 tablet 0   lisinopril (ZESTRIL) 10 MG tablet Take 1 tablet (10 mg total) by mouth daily. 14 tablet 0   QUEtiapine (SEROQUEL) 200 MG tablet Take 1 tablet (200 mg total) by mouth at bedtime. 14 tablet 0   Respiratory Therapy Supplies (TUBING/WING TIP) MISC Once daily (Patient not taking: Reported on 01/17/2023) 1 each 8   silver sulfADIAZINE (SILVADENE) 1 % cream Apply 1 Application topically 2 (two) times daily. 50 g 0   Sod Fluoride-Potassium Nitrate (PREVIDENT 5000 SENSITIVE) 1.1-5 % GEL USE AS DIRECTED ON PACKAGE (Patient not taking: Reported on 01/17/2023) 100 mL 1   Sodium Fluoride 1.1 % PSTE Apply a pea-sized amount of the paste to a toothbrush and brush thoroughly for 2 minutes preferably at bedtime. (Patient not taking: Reported on 01/17/2023) 100 mL 0   SYRINGE-NEEDLE, DISP, 3 ML (B-D 3CC LUER-LOK SYR 22GX1") 22G X 1" 3 ML MISC Use to inject testosterone (Patient not taking: Reported on 01/17/2023) 10 each 3   tamsulosin (FLOMAX) 0.4 MG CAPS capsule Take 1 capsule (0.4 mg total) by mouth daily. 14 capsule 0   tirzepatide (MOUNJARO) 5 MG/0.5ML Pen Inject 5 mg into the skin once a week. (Patient not taking: Reported on 01/17/2023) 6 mL 3   No current facility-administered medications for this visit.    Allergies: Patient has no known allergies.  Past Medical History:  Diagnosis Date   Adjustment disorder with mixed anxiety and depressed mood    Arthritis    Asthma    history   Diabetes mellitus    type 2   Elevated blood pressure    history of high blood pressure readings   History of chicken pox    Hypercholesteremia    Hypertension    Internal hemorrhoids    Tubular adenoma of colon     Past Surgical History:  Procedure Laterality Date   ELBOW SURGERY     right elbow 2010, left elbow 08/06/10 Wendall Mola)   KNEE SURGERY     right knee new ACL- [phone]   KNEE SURGERY     left knee 2003   NASAL  SEPTUM SURGERY     1992   PARTIAL HIP ARTHROPLASTY     right hip replacement    Family History  Problem Relation Age of Onset   Diabetes Mother    Thyroid disease Mother        Questionable   Heart disease Father        deceased   Other Neg Hx        hypogonadism   Colon cancer Neg Hx     Social History   Tobacco Use   Smoking status: Never   Smokeless tobacco: Never  Substance Use Topics   Alcohol use: Yes    Alcohol/week: 7.0 - 14.0 standard drinks of alcohol    Types: 7 - 14 Glasses of wine per week  Subjective:   Patient was recently discharged from hospital and had numerous medication changes; his PCP is unavailable today- asking for short term refills until able to see providers; Is in an intensive outpatient program here locally- very optimistic about medication changes that have been made for him;   Objective:  Vitals:   01/17/23 1533  BP: 134/78  Pulse: 91  SpO2: 97%  Height: 6\' 1"  (1.854 m)    General: Well developed, well nourished, in no acute distress  Skin : Warm and dry.  Head: Normocephalic and atraumatic  Lungs: Respirations unlabored;  Neurologic: Alert and oriented; speech intact; face symmetrical; moves all extremities well; CNII-XII intact without focal deficit   Assessment:  1. Essential hypertension   2. Anxiety and depression     Plan:  Short term refills given on chronic medication requests that have previously been managed by his current PCP; he understands he needs to reach out to his PCP and psychiatrist to discuss long term medication refills.   Time spent 30 minutes  No follow-ups on file.  No orders of the defined types were placed in this encounter.   Requested Prescriptions   Signed Prescriptions Disp Refills   QUEtiapine (SEROQUEL) 200 MG tablet 14 tablet 0    Sig: Take 1 tablet (200 mg total) by mouth at bedtime.   apixaban (ELIQUIS) 5 MG TABS tablet 28 tablet 0    Sig: Take 1 tablet (5 mg total) by mouth 2 (two)  times daily.   carvedilol (COREG) 12.5 MG tablet 28 tablet 0    Sig: Take 1 tablet (12.5 mg total) by mouth in the morning and 1 tablet (12.5 mg total) in the evening. Take with meals.   lamoTRIgine (LAMICTAL) 100 MG tablet 14 tablet 0    Sig: Take 1 tablet (100 mg total) by mouth daily.   lisinopril (ZESTRIL) 10 MG tablet 14 tablet 0    Sig: Take 1 tablet (10 mg total) by mouth daily.   tamsulosin (FLOMAX) 0.4 MG CAPS capsule 14 capsule 0    Sig: Take 1 capsule (0.4 mg total) by mouth daily.   famotidine (PEPCID) 20 MG tablet 60 tablet 0    Sig: Take 1 tablet (20 mg total) by mouth 2 (two) times daily.   silver sulfADIAZINE (SILVADENE) 1 % cream 50 g 0    Sig: Apply 1 Application topically 2 (two) times daily.

## 2023-01-20 ENCOUNTER — Other Ambulatory Visit (HOSPITAL_COMMUNITY): Payer: Self-pay

## 2023-01-20 ENCOUNTER — Other Ambulatory Visit (HOSPITAL_BASED_OUTPATIENT_CLINIC_OR_DEPARTMENT_OTHER): Payer: Self-pay

## 2023-01-20 ENCOUNTER — Other Ambulatory Visit: Payer: Self-pay

## 2023-01-20 DIAGNOSIS — M5415 Radiculopathy, thoracolumbar region: Secondary | ICD-10-CM | POA: Diagnosis not present

## 2023-01-20 DIAGNOSIS — M9903 Segmental and somatic dysfunction of lumbar region: Secondary | ICD-10-CM | POA: Diagnosis not present

## 2023-01-20 DIAGNOSIS — M546 Pain in thoracic spine: Secondary | ICD-10-CM | POA: Diagnosis not present

## 2023-01-20 DIAGNOSIS — F3175 Bipolar disorder, in partial remission, most recent episode depressed: Secondary | ICD-10-CM | POA: Diagnosis not present

## 2023-01-20 DIAGNOSIS — M9902 Segmental and somatic dysfunction of thoracic region: Secondary | ICD-10-CM | POA: Diagnosis not present

## 2023-01-21 ENCOUNTER — Other Ambulatory Visit (HOSPITAL_BASED_OUTPATIENT_CLINIC_OR_DEPARTMENT_OTHER): Payer: Self-pay

## 2023-01-21 ENCOUNTER — Ambulatory Visit: Payer: Federal, State, Local not specified - PPO | Admitting: Family Medicine

## 2023-01-21 DIAGNOSIS — F3175 Bipolar disorder, in partial remission, most recent episode depressed: Secondary | ICD-10-CM | POA: Diagnosis not present

## 2023-01-22 ENCOUNTER — Other Ambulatory Visit (HOSPITAL_BASED_OUTPATIENT_CLINIC_OR_DEPARTMENT_OTHER): Payer: Self-pay

## 2023-01-22 DIAGNOSIS — F3175 Bipolar disorder, in partial remission, most recent episode depressed: Secondary | ICD-10-CM | POA: Diagnosis not present

## 2023-01-22 LAB — HM DIABETES EYE EXAM

## 2023-01-23 ENCOUNTER — Other Ambulatory Visit (HOSPITAL_BASED_OUTPATIENT_CLINIC_OR_DEPARTMENT_OTHER): Payer: Self-pay

## 2023-01-23 DIAGNOSIS — F3175 Bipolar disorder, in partial remission, most recent episode depressed: Secondary | ICD-10-CM | POA: Diagnosis not present

## 2023-01-24 ENCOUNTER — Other Ambulatory Visit (HOSPITAL_COMMUNITY): Payer: Self-pay | Admitting: Psychiatry

## 2023-01-24 ENCOUNTER — Other Ambulatory Visit (HOSPITAL_BASED_OUTPATIENT_CLINIC_OR_DEPARTMENT_OTHER): Payer: Self-pay

## 2023-01-24 DIAGNOSIS — F3175 Bipolar disorder, in partial remission, most recent episode depressed: Secondary | ICD-10-CM | POA: Diagnosis not present

## 2023-01-27 ENCOUNTER — Ambulatory Visit: Payer: Federal, State, Local not specified - PPO | Admitting: Internal Medicine

## 2023-01-27 ENCOUNTER — Other Ambulatory Visit (HOSPITAL_BASED_OUTPATIENT_CLINIC_OR_DEPARTMENT_OTHER): Payer: Self-pay

## 2023-01-27 DIAGNOSIS — F3175 Bipolar disorder, in partial remission, most recent episode depressed: Secondary | ICD-10-CM | POA: Diagnosis not present

## 2023-01-28 ENCOUNTER — Other Ambulatory Visit (HOSPITAL_BASED_OUTPATIENT_CLINIC_OR_DEPARTMENT_OTHER): Payer: Self-pay

## 2023-01-28 DIAGNOSIS — F3175 Bipolar disorder, in partial remission, most recent episode depressed: Secondary | ICD-10-CM | POA: Diagnosis not present

## 2023-01-30 ENCOUNTER — Other Ambulatory Visit (HOSPITAL_BASED_OUTPATIENT_CLINIC_OR_DEPARTMENT_OTHER): Payer: Self-pay

## 2023-01-30 ENCOUNTER — Other Ambulatory Visit: Payer: Self-pay | Admitting: Family

## 2023-01-30 ENCOUNTER — Other Ambulatory Visit: Payer: Self-pay | Admitting: Family Medicine

## 2023-01-30 ENCOUNTER — Other Ambulatory Visit: Payer: Self-pay

## 2023-01-30 DIAGNOSIS — F3175 Bipolar disorder, in partial remission, most recent episode depressed: Secondary | ICD-10-CM | POA: Diagnosis not present

## 2023-01-30 MED ORDER — LAMOTRIGINE 100 MG PO TABS
100.0000 mg | ORAL_TABLET | Freq: Every day | ORAL | 0 refills | Status: DC
  Filled 2023-01-30: qty 30, 30d supply, fill #0

## 2023-01-30 MED ORDER — LISINOPRIL 10 MG PO TABS
10.0000 mg | ORAL_TABLET | Freq: Every day | ORAL | 1 refills | Status: DC
  Filled 2023-01-30: qty 90, 90d supply, fill #0
  Filled 2023-03-12: qty 90, 90d supply, fill #1

## 2023-01-30 MED ORDER — FAMOTIDINE 20 MG PO TABS
20.0000 mg | ORAL_TABLET | Freq: Two times a day (BID) | ORAL | 1 refills | Status: DC
  Filled 2023-01-30: qty 180, 90d supply, fill #0
  Filled 2023-05-22: qty 180, 90d supply, fill #1

## 2023-01-30 MED ORDER — QUETIAPINE FUMARATE 200 MG PO TABS
200.0000 mg | ORAL_TABLET | Freq: Every day | ORAL | 0 refills | Status: DC
  Filled 2023-01-30: qty 30, 30d supply, fill #0

## 2023-01-30 NOTE — Telephone Encounter (Signed)
Please let pt know I sent 90 d refills of what we prescribe and a 30 d supply of medicine to hold him over until he gets in with psychiatry. Ty.

## 2023-01-31 ENCOUNTER — Telehealth: Payer: Self-pay | Admitting: Family Medicine

## 2023-01-31 ENCOUNTER — Telehealth: Payer: Self-pay

## 2023-01-31 ENCOUNTER — Other Ambulatory Visit (HOSPITAL_BASED_OUTPATIENT_CLINIC_OR_DEPARTMENT_OTHER): Payer: Self-pay

## 2023-01-31 MED ORDER — PENICILLIN V POTASSIUM 500 MG PO TABS
500.0000 mg | ORAL_TABLET | Freq: Four times a day (QID) | ORAL | 0 refills | Status: DC
  Filled 2023-01-31: qty 28, 7d supply, fill #0

## 2023-01-31 MED ORDER — CLINPRO 5000 1.1 % DT PSTE
PASTE | DENTAL | 3 refills | Status: AC
  Filled 2023-05-22: qty 100, 30d supply, fill #0
  Filled 2023-10-28: qty 100, 30d supply, fill #1

## 2023-01-31 MED ORDER — CARVEDILOL 12.5 MG PO TABS
12.5000 mg | ORAL_TABLET | Freq: Two times a day (BID) | ORAL | 1 refills | Status: DC
  Filled 2023-01-31 – 2023-02-03 (×3): qty 180, 90d supply, fill #0
  Filled 2023-03-12: qty 180, 90d supply, fill #1

## 2023-01-31 NOTE — Telephone Encounter (Signed)
Letter has been faxed.

## 2023-01-31 NOTE — Telephone Encounter (Signed)
Would prefer not to hold prior to 03/01/23. After that, would hold for 2 d prior to procedure. Restart when no bleeding as determined by proceduralist.

## 2023-01-31 NOTE — Telephone Encounter (Signed)
See my other note

## 2023-01-31 NOTE — Telephone Encounter (Signed)
Copied from CRM [id number]. Topic: Clinical - Medication Question >> Jan 31, 2023 11:27 AM Maxwell Marion wrote: Reason for CRM: Pt is at the dentist office, actually in the chair to get a dental procedure done, the doctor is wanting to know if it will be okay for the patient to come off of the apixaban for a few days. Call back to office is [phone]

## 2023-01-31 NOTE — Telephone Encounter (Signed)
William [name] from dr Brayton [facility] wants to know if it is ok for patient to go off of his apixaban (ELIQUIS) 5 MG TABS tablet  medication and how long can he stay off it and they would like a response sent back through to there fax machine

## 2023-02-03 ENCOUNTER — Telehealth: Payer: Self-pay | Admitting: Emergency Medicine

## 2023-02-03 ENCOUNTER — Other Ambulatory Visit (HOSPITAL_BASED_OUTPATIENT_CLINIC_OR_DEPARTMENT_OTHER): Payer: Self-pay

## 2023-02-03 ENCOUNTER — Other Ambulatory Visit: Payer: Self-pay

## 2023-02-03 DIAGNOSIS — F3175 Bipolar disorder, in partial remission, most recent episode depressed: Secondary | ICD-10-CM | POA: Diagnosis not present

## 2023-02-03 MED ORDER — QUETIAPINE FUMARATE 200 MG PO TABS
200.0000 mg | ORAL_TABLET | Freq: Every day | ORAL | 0 refills | Status: DC
  Filled 2023-02-03 – 2023-03-04 (×2): qty 90, 90d supply, fill #0

## 2023-02-03 MED ORDER — LAMOTRIGINE 100 MG PO TABS
100.0000 mg | ORAL_TABLET | Freq: Every day | ORAL | 0 refills | Status: DC
  Filled 2023-02-03 – 2023-03-04 (×2): qty 90, 90d supply, fill #0

## 2023-02-03 NOTE — Telephone Encounter (Signed)
Copied from CRM [id number]. Topic: Clinical - Medical Advice >> Feb 03, 2023  9:46 AM Efraim Kaufmann C wrote: Reason for CRM: Received call from patient's dentist, Dr. Josetta Huddle. They were inquiring as to why patient is on apixaban (ELIQUIS) 5 MG TABS tablet. If it is completely necessary, they may need to take him off of it for a few days for a procedure. Please respond.

## 2023-02-04 ENCOUNTER — Other Ambulatory Visit: Payer: Self-pay | Admitting: Family Medicine

## 2023-02-04 ENCOUNTER — Other Ambulatory Visit (HOSPITAL_BASED_OUTPATIENT_CLINIC_OR_DEPARTMENT_OTHER): Payer: Self-pay

## 2023-02-04 DIAGNOSIS — I2699 Other pulmonary embolism without acute cor pulmonale: Secondary | ICD-10-CM

## 2023-02-04 DIAGNOSIS — F3175 Bipolar disorder, in partial remission, most recent episode depressed: Secondary | ICD-10-CM | POA: Diagnosis not present

## 2023-02-04 NOTE — Telephone Encounter (Signed)
 They are free to take him off with Dr. Junie Spencer medical decision-making. He had a pulmonary embolism during an October hospitalization of this year.

## 2023-02-05 ENCOUNTER — Other Ambulatory Visit: Payer: Self-pay | Admitting: Family

## 2023-02-05 ENCOUNTER — Encounter (HOSPITAL_BASED_OUTPATIENT_CLINIC_OR_DEPARTMENT_OTHER): Payer: Self-pay

## 2023-02-06 ENCOUNTER — Other Ambulatory Visit: Payer: Self-pay | Admitting: Family Medicine

## 2023-02-06 ENCOUNTER — Other Ambulatory Visit: Payer: Self-pay

## 2023-02-06 ENCOUNTER — Other Ambulatory Visit (HOSPITAL_BASED_OUTPATIENT_CLINIC_OR_DEPARTMENT_OTHER): Payer: Self-pay

## 2023-02-06 DIAGNOSIS — F3175 Bipolar disorder, in partial remission, most recent episode depressed: Secondary | ICD-10-CM | POA: Diagnosis not present

## 2023-02-06 MED ORDER — APIXABAN 5 MG PO TABS
5.0000 mg | ORAL_TABLET | Freq: Two times a day (BID) | ORAL | 0 refills | Status: DC
  Filled 2023-05-28 (×2): qty 60, 30d supply, fill #0
  Filled ????-??-?? (×2): fill #0

## 2023-02-06 MED ORDER — TAMSULOSIN HCL 0.4 MG PO CAPS
0.4000 mg | ORAL_CAPSULE | Freq: Every day | ORAL | 1 refills | Status: DC
  Filled 2023-02-06 – 2023-02-10 (×2): qty 90, 90d supply, fill #0

## 2023-02-06 NOTE — Telephone Encounter (Signed)
 I know he was supposed to be on it for at least 3 months. Let's get him in with the hematology team to determine if he needs longer. I will place the referral, please update pt. Refill 30 d if he is running out. Ty.

## 2023-02-06 NOTE — Telephone Encounter (Signed)
 Pt has appt on 02/12/23

## 2023-02-07 ENCOUNTER — Other Ambulatory Visit (HOSPITAL_BASED_OUTPATIENT_CLINIC_OR_DEPARTMENT_OTHER): Payer: Self-pay

## 2023-02-07 DIAGNOSIS — F3175 Bipolar disorder, in partial remission, most recent episode depressed: Secondary | ICD-10-CM | POA: Diagnosis not present

## 2023-02-07 NOTE — Telephone Encounter (Signed)
 Called the number back to spoke with someone from Dr Junie Spencer and the number don't work.

## 2023-02-10 ENCOUNTER — Other Ambulatory Visit (HOSPITAL_BASED_OUTPATIENT_CLINIC_OR_DEPARTMENT_OTHER): Payer: Self-pay

## 2023-02-10 DIAGNOSIS — F3175 Bipolar disorder, in partial remission, most recent episode depressed: Secondary | ICD-10-CM | POA: Diagnosis not present

## 2023-02-11 ENCOUNTER — Telehealth: Payer: Self-pay

## 2023-02-11 ENCOUNTER — Other Ambulatory Visit (HOSPITAL_BASED_OUTPATIENT_CLINIC_OR_DEPARTMENT_OTHER): Payer: Self-pay

## 2023-02-11 DIAGNOSIS — F3175 Bipolar disorder, in partial remission, most recent episode depressed: Secondary | ICD-10-CM | POA: Diagnosis not present

## 2023-02-11 NOTE — Telephone Encounter (Signed)
 Called pt and sent mychart message to see if he could  come in at 11:30.

## 2023-02-11 NOTE — Telephone Encounter (Signed)
 Message sent to pt about changing appt time.

## 2023-02-12 ENCOUNTER — Ambulatory Visit: Payer: Federal, State, Local not specified - PPO | Admitting: Family Medicine

## 2023-02-12 ENCOUNTER — Encounter: Payer: Self-pay | Admitting: Family Medicine

## 2023-02-12 ENCOUNTER — Other Ambulatory Visit (HOSPITAL_BASED_OUTPATIENT_CLINIC_OR_DEPARTMENT_OTHER): Payer: Self-pay

## 2023-02-12 ENCOUNTER — Other Ambulatory Visit: Payer: Self-pay | Admitting: Family Medicine

## 2023-02-12 VITALS — BP 130/74 | HR 84 | Temp 98.0°F | Resp 16 | Ht 73.0 in | Wt 205.0 lb

## 2023-02-12 DIAGNOSIS — R350 Frequency of micturition: Secondary | ICD-10-CM

## 2023-02-12 DIAGNOSIS — J342 Deviated nasal septum: Secondary | ICD-10-CM | POA: Diagnosis not present

## 2023-02-12 DIAGNOSIS — I2699 Other pulmonary embolism without acute cor pulmonale: Secondary | ICD-10-CM | POA: Diagnosis not present

## 2023-02-12 DIAGNOSIS — N401 Enlarged prostate with lower urinary tract symptoms: Secondary | ICD-10-CM

## 2023-02-12 DIAGNOSIS — F3175 Bipolar disorder, in partial remission, most recent episode depressed: Secondary | ICD-10-CM | POA: Diagnosis not present

## 2023-02-12 MED ORDER — TAMSULOSIN HCL 0.4 MG PO CAPS
0.8000 mg | ORAL_CAPSULE | Freq: Every day | ORAL | 1 refills | Status: DC
  Filled 2023-02-12 – 2023-03-13 (×4): qty 180, 90d supply, fill #0
  Filled 2023-05-28: qty 180, 90d supply, fill #1
  Filled ????-??-??: fill #1
  Filled ????-??-??: fill #0
  Filled ????-??-??: fill #1

## 2023-02-12 MED ORDER — MELATONIN 10 MG PO TABS: 10.0000 mg | ORAL_TABLET | Freq: Every evening | ORAL | Status: DC

## 2023-02-12 NOTE — Telephone Encounter (Signed)
 Pt comes at 415 today.

## 2023-02-12 NOTE — Patient Instructions (Addendum)
 If you do not hear anything about your referral in the next 1-2 weeks, call our office and ask for an update.  We will decide about the Eliquis after your hematology visit.   Let us know if you need anything.

## 2023-02-12 NOTE — Progress Notes (Signed)
 Chief Complaint  Patient presents with   Follow-up    Follow up    Subjective: Patient is a 63 y.o. male here for follow-up.  Patient had a suicidal attempt in October.  He went to an inpatient facility and was started on Seroquel  200 mg nightly and Lamictal  100 mg daily.  He reports much improvement in his mood.  He is in outpatient therapy in the area.  He is rekindling his marriage.  He is content where things are at.  He has a deviated nasal septum that was repaired many years ago.  He saw an ENT in the hospital who recommended repeat repair.  He would like to be referred to that physician.  The patient is having more urinary frequency.  He is compliant with Flomax  0.4 mg daily.  No adverse effects.  No bleeding, discharge, or pain.  The patient has a history of a pulmonary embolism during his hospitalization.  No history of clots.  There was no trauma or recent surgery.  He is compliant with Eliquis  5 mg twice daily.  He was not told how long he needs to be on this.  He has an appointment with the hematology team at the end of the month.  Past Medical History:  Diagnosis Date   Adjustment disorder with mixed anxiety and depressed mood    Arthritis    Asthma    history   Diabetes mellitus    type 2   Elevated blood pressure    history of high blood pressure readings   History of chicken pox    Hypercholesteremia    Hypertension    Internal hemorrhoids    Tubular adenoma of colon     Objective: BP 130/74   Pulse 84   Temp 98 F (36.7 C) (Oral)   Resp 16   Ht 6' 1 (1.854 m)   Wt 205 lb (93 kg)   SpO2 97%   BMI 27.05 kg/m  General: Awake, appears stated age Nose: Near the nares, there is deviation of the septum to the left, higher the nasal passage it deviates to the right.  There is no complete obstruction.  I do not appreciate any polyps.  No rhinorrhea. Heart: RRR, no LE edema Lungs: CTAB, no rales, wheezes or rhonchi. 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 Burnie in the hospital upon further review.  Reports Dr. Burnie did recommend future repair.  Will refer to [facility] ENT. Chronic, unstable.  Increase Flomax  from 0.4 mg daily to 0.8 mg daily. I have a tough time discerning whether this was provoked or not.  He was down for a while which could have contributed to developing an embolism.  No obvious surgery, trauma, or history of thromboembolism.  Appreciate hematology team, he will see them on 1/27.  He leaves for [place] on 2/3.  I will send in another 30 days of Eliquis  5 mg to take twice daily until then.  1/14 will mark for 73-month point.  He did miss a couple days for dental procedure. We also went over all of his medications for which she needs refills of.  He will also follow-up with his endocrinologist prior  to going to [place].  Endocrinology is managing his diabetes, cholesterol, and testosterone . He is now stable on Seroquel  200 mg nightly and Lamictal  100 mg daily.  Appreciate psychiatry team. The patient voiced understanding and agreement to the plan.  I spent 42 minutes with the patient discussing the above plans in addition to reviewing his chart and outside hospitalization records on the same day of the visit.  Mabel [place], DO 02/12/23  4:57 PM

## 2023-02-13 ENCOUNTER — Other Ambulatory Visit (HOSPITAL_BASED_OUTPATIENT_CLINIC_OR_DEPARTMENT_OTHER): Payer: Self-pay

## 2023-02-13 DIAGNOSIS — F3175 Bipolar disorder, in partial remission, most recent episode depressed: Secondary | ICD-10-CM | POA: Diagnosis not present

## 2023-02-14 ENCOUNTER — Other Ambulatory Visit (HOSPITAL_BASED_OUTPATIENT_CLINIC_OR_DEPARTMENT_OTHER): Payer: Self-pay

## 2023-02-14 DIAGNOSIS — F3175 Bipolar disorder, in partial remission, most recent episode depressed: Secondary | ICD-10-CM | POA: Diagnosis not present

## 2023-02-17 ENCOUNTER — Other Ambulatory Visit (HOSPITAL_BASED_OUTPATIENT_CLINIC_OR_DEPARTMENT_OTHER): Payer: Self-pay

## 2023-02-17 DIAGNOSIS — F3175 Bipolar disorder, in partial remission, most recent episode depressed: Secondary | ICD-10-CM | POA: Diagnosis not present

## 2023-02-18 ENCOUNTER — Other Ambulatory Visit (HOSPITAL_BASED_OUTPATIENT_CLINIC_OR_DEPARTMENT_OTHER): Payer: Self-pay

## 2023-02-18 DIAGNOSIS — F3175 Bipolar disorder, in partial remission, most recent episode depressed: Secondary | ICD-10-CM | POA: Diagnosis not present

## 2023-02-19 ENCOUNTER — Other Ambulatory Visit (HOSPITAL_BASED_OUTPATIENT_CLINIC_OR_DEPARTMENT_OTHER): Payer: Self-pay

## 2023-02-20 ENCOUNTER — Other Ambulatory Visit (HOSPITAL_BASED_OUTPATIENT_CLINIC_OR_DEPARTMENT_OTHER): Payer: Self-pay

## 2023-02-20 DIAGNOSIS — F3175 Bipolar disorder, in partial remission, most recent episode depressed: Secondary | ICD-10-CM | POA: Diagnosis not present

## 2023-02-21 ENCOUNTER — Other Ambulatory Visit (HOSPITAL_BASED_OUTPATIENT_CLINIC_OR_DEPARTMENT_OTHER): Payer: Self-pay

## 2023-02-21 DIAGNOSIS — F3175 Bipolar disorder, in partial remission, most recent episode depressed: Secondary | ICD-10-CM | POA: Diagnosis not present

## 2023-02-24 ENCOUNTER — Other Ambulatory Visit (HOSPITAL_BASED_OUTPATIENT_CLINIC_OR_DEPARTMENT_OTHER): Payer: Self-pay

## 2023-02-24 ENCOUNTER — Telehealth: Payer: Self-pay

## 2023-02-24 ENCOUNTER — Other Ambulatory Visit (HOSPITAL_COMMUNITY): Payer: Self-pay

## 2023-02-24 DIAGNOSIS — F3175 Bipolar disorder, in partial remission, most recent episode depressed: Secondary | ICD-10-CM | POA: Diagnosis not present

## 2023-02-24 NOTE — Telephone Encounter (Signed)
Pharmacy Patient Advocate Encounter   Received notification from CoverMyMeds that prior authorization for Testosterone Enanthate 200MG /ML solution is required/requested.   Insurance verification completed.   The patient is insured through [facility] .   Per test claim: The current 90 day co-pay is, $22.50.  No PA needed at this time. This test claim was processed through [facility]- copay amounts may vary at other pharmacies due to pharmacy/plan contracts, or as the patient moves through the different stages of their insurance plan.     PA on file expires 07/19/2023

## 2023-02-24 NOTE — Telephone Encounter (Signed)
Pharmacy Patient Advocate Encounter   Received notification from CoverMyMeds that prior authorization for [facility] is required/requested.   Insurance verification completed.   The patient is insured through [facility] .   Per test claim: PA required; PA submitted to above mentioned insurance via CoverMyMeds Key/confirmation #/EOC [facility] Status is pending

## 2023-02-24 NOTE — Telephone Encounter (Signed)
Pharmacy Patient Advocate Encounter  Received notification from [facility] that Prior Authorization for William [name] has been APPROVED through 02/24/2024   PA #/Case ID/Reference #: [id number]

## 2023-02-25 ENCOUNTER — Other Ambulatory Visit (HOSPITAL_BASED_OUTPATIENT_CLINIC_OR_DEPARTMENT_OTHER): Payer: Self-pay

## 2023-02-26 DIAGNOSIS — F3175 Bipolar disorder, in partial remission, most recent episode depressed: Secondary | ICD-10-CM | POA: Diagnosis not present

## 2023-02-27 DIAGNOSIS — F3175 Bipolar disorder, in partial remission, most recent episode depressed: Secondary | ICD-10-CM | POA: Diagnosis not present

## 2023-03-03 ENCOUNTER — Encounter: Payer: Federal, State, Local not specified - PPO | Admitting: Family

## 2023-03-03 ENCOUNTER — Inpatient Hospital Stay: Payer: Federal, State, Local not specified - PPO

## 2023-03-04 ENCOUNTER — Other Ambulatory Visit: Payer: Self-pay

## 2023-03-04 ENCOUNTER — Other Ambulatory Visit (HOSPITAL_BASED_OUTPATIENT_CLINIC_OR_DEPARTMENT_OTHER): Payer: Self-pay

## 2023-03-04 DIAGNOSIS — F3175 Bipolar disorder, in partial remission, most recent episode depressed: Secondary | ICD-10-CM | POA: Diagnosis not present

## 2023-03-05 DIAGNOSIS — F3175 Bipolar disorder, in partial remission, most recent episode depressed: Secondary | ICD-10-CM | POA: Diagnosis not present

## 2023-03-06 ENCOUNTER — Other Ambulatory Visit (HOSPITAL_BASED_OUTPATIENT_CLINIC_OR_DEPARTMENT_OTHER): Payer: Self-pay

## 2023-03-06 DIAGNOSIS — F3175 Bipolar disorder, in partial remission, most recent episode depressed: Secondary | ICD-10-CM | POA: Diagnosis not present

## 2023-03-10 ENCOUNTER — Encounter: Payer: Self-pay | Admitting: Internal Medicine

## 2023-03-10 ENCOUNTER — Encounter (HOSPITAL_COMMUNITY): Payer: Self-pay | Admitting: Psychiatry

## 2023-03-10 ENCOUNTER — Ambulatory Visit: Payer: Federal, State, Local not specified - PPO | Admitting: Internal Medicine

## 2023-03-10 ENCOUNTER — Other Ambulatory Visit (HOSPITAL_BASED_OUTPATIENT_CLINIC_OR_DEPARTMENT_OTHER): Payer: Self-pay

## 2023-03-10 ENCOUNTER — Other Ambulatory Visit: Payer: Self-pay

## 2023-03-10 ENCOUNTER — Telehealth (HOSPITAL_BASED_OUTPATIENT_CLINIC_OR_DEPARTMENT_OTHER): Payer: Federal, State, Local not specified - PPO | Admitting: Psychiatry

## 2023-03-10 VITALS — Wt 205.0 lb

## 2023-03-10 VITALS — BP 124/76 | HR 94 | Ht 73.0 in | Wt 200.0 lb

## 2023-03-10 DIAGNOSIS — E785 Hyperlipidemia, unspecified: Secondary | ICD-10-CM | POA: Diagnosis not present

## 2023-03-10 DIAGNOSIS — E291 Testicular hypofunction: Secondary | ICD-10-CM

## 2023-03-10 DIAGNOSIS — Z7985 Long-term (current) use of injectable non-insulin antidiabetic drugs: Secondary | ICD-10-CM | POA: Diagnosis not present

## 2023-03-10 DIAGNOSIS — F4312 Post-traumatic stress disorder, chronic: Secondary | ICD-10-CM | POA: Diagnosis not present

## 2023-03-10 DIAGNOSIS — F331 Major depressive disorder, recurrent, moderate: Secondary | ICD-10-CM | POA: Diagnosis not present

## 2023-03-10 DIAGNOSIS — E119 Type 2 diabetes mellitus without complications: Secondary | ICD-10-CM

## 2023-03-10 DIAGNOSIS — Z794 Long term (current) use of insulin: Secondary | ICD-10-CM | POA: Diagnosis not present

## 2023-03-10 LAB — POCT GLYCOSYLATED HEMOGLOBIN (HGB A1C): Hemoglobin A1C: 6.7 % — AB (ref 4.0–5.6)

## 2023-03-10 MED ORDER — INSULIN PEN NEEDLE 29G X 12.7MM MISC
1.0000 | Freq: Every day | 3 refills | Status: AC
  Filled 2023-03-10 (×2): qty 100, 90d supply, fill #0
  Filled 2023-05-22: qty 100, 90d supply, fill #1

## 2023-03-10 MED ORDER — SYNJARDY 12.5-1000 MG PO TABS
1.0000 | ORAL_TABLET | Freq: Two times a day (BID) | ORAL | 3 refills | Status: DC
  Filled 2023-03-10: qty 180, 90d supply, fill #0
  Filled 2023-05-28: qty 180, 90d supply, fill #1
  Filled 2023-08-15: qty 106, 53d supply, fill #2
  Filled 2023-10-28: qty 180, 90d supply, fill #3
  Filled ????-??-?? (×2): fill #1

## 2023-03-10 MED ORDER — TESTOSTERONE ENANTHATE 200 MG/ML IM SOLN
50.0000 mg | INTRAMUSCULAR | 0 refills | Status: DC
  Filled 2023-03-10: qty 10, 60d supply, fill #0
  Filled 2023-03-10: qty 6, 168d supply, fill #0

## 2023-03-10 MED ORDER — QUETIAPINE FUMARATE 200 MG PO TABS
200.0000 mg | ORAL_TABLET | Freq: Every day | ORAL | 0 refills | Status: DC
  Filled 2023-03-10: qty 90, 90d supply, fill #0

## 2023-03-10 MED ORDER — TIRZEPATIDE 5 MG/0.5ML ~~LOC~~ SOAJ
5.0000 mg | SUBCUTANEOUS | 3 refills | Status: DC
  Filled 2023-03-10 – 2023-05-28 (×2): qty 6, 84d supply, fill #0
  Filled 2023-08-15: qty 4, 56d supply, fill #1

## 2023-03-10 MED ORDER — LAMOTRIGINE 150 MG PO TABS
150.0000 mg | ORAL_TABLET | Freq: Every day | ORAL | 0 refills | Status: DC
  Filled 2023-03-10: qty 90, 90d supply, fill #0

## 2023-03-10 MED ORDER — FREESTYLE LIBRE 3 PLUS SENSOR MISC
1.0000 | 3 refills | Status: AC
  Filled 2023-03-10: qty 6, 84d supply, fill #0
  Filled 2023-05-22: qty 6, 90d supply, fill #0
  Filled 2023-08-15: qty 4, 64d supply, fill #1
  Filled 2023-10-28: qty 6, 90d supply, fill #2
  Filled 2024-01-27: qty 6, 90d supply, fill #3

## 2023-03-10 MED ORDER — BASAGLAR KWIKPEN 100 UNIT/ML ~~LOC~~ SOPN
35.0000 [IU] | PEN_INJECTOR | Freq: Every day | SUBCUTANEOUS | 3 refills | Status: DC
  Filled 2023-03-10: qty 45, 128d supply, fill #0
  Filled 2023-06-17 (×2): qty 30, 85d supply, fill #0
  Filled 2023-06-17: qty 12, 30d supply, fill #0

## 2023-03-10 NOTE — Progress Notes (Signed)
[facility] MD Virtual Progress Note   Patient Location: Home Provider Location: Home Office  I connect with patient by Video and verified that I am speaking with correct person by using two identifiers. I discussed the limitations of evaluation and management by telemedicine and the availability of in person appointments. I also discussed with the patient that there may be a patient responsible charge related to this service. The patient expressed understanding and agreed to proceed.  William [name] [id number] 63 y.o.  03/10/2023 9:28 AM  History of Present Illness:  Patient is evaluated by video session.  It was an urgent appointment because patient is going to [place] next week and needed the refills.  Patient told he is doing IOP at [facility] located in [place].  He reported earlier he finished a program in [place] and then he went to [place] and now he is in [place] started program since December 8.  Patient told he finished PHP and now stepdown to IOP.  Patient told next week he is going to [place] to visit his religious place [employer], building services) reported things are going very well since he started the Lamictal.  He is not taking 100 but like to go up on higher dose.  He is sleeping good with the help of melatonin 10 mg.  He is no longer taking Ambien, trazodone, Zoloft.  He reported his irritability, anger, depression is better.  He reported his family life is much better and recently they bought a new house.  His plan is to run weight the house once he come back from [place] in 2 months.  His plan is to see marriage counselor and he is also building much better relationship with his son and yesterday he met with his son and his therapist.  Patient told group therapy has changed his life and he is taking his life differently.  He wants to build a relationship with his family member.  He denies any anger, irritability, crying spells or any feeling of hopelessness or worthlessness.  He  is very hopeful as like to spend time in [place] to connect with his r holy place.  He reported he was out of the medication after his insurance accidentally canceled.  His struggle with medication but now he is taking his medication regularly.  He is taking Mounjaro but is helping his blood sugar and his last hemoglobin A1c was 6.9 which dropped from 7.1.  He has no desire to harm himself.  He regret about his at that requires hospitalization.  He wants to continue treatment once he returns from [place] and like to get individual counselor and med management appointments.  He is trying to lose weight.  He has no rash, itching, tremors or shakes.  Past Psychiatric History: H/O depression, anxiety and ADHD diagnosed by Dr. Jodi Marble.  Tried Wellbutrin but did not continued for a long time.  Started medication in 2000 by PCP.  Took Paxil but stopped due to sexual side effects, took Seroquel, Xanax, Zoloft and Seroquel from PCP.  History of PTSD. H/O inpatient at [facility] in October 2024 after intentional overdose on Xanax.  He did PHP at [facility] in [place] and then in [place] and recently in [place].   Outpatient Encounter Medications as of 03/10/2023  Medication Sig   [DISCONTINUED] sertraline (ZOLOFT) 50 MG tablet Take 1 tablet by mouth daily.   apixaban (ELIQUIS) 5 MG TABS tablet Take 1 tablet (5 mg total) by mouth 2 (two) times daily.   carvedilol (  COREG) 12.5 MG tablet Take 1 tablet (12.5 mg total) by mouth in the morning and 1 tablet (12.5 mg total) in the evening. Take with meals.   chlorhexidine (PERIOGARD) 0.12 % solution RINSE MOUTH WITH (1 CAPFUL) FOR 30 SECONDS AM AND PM AFTER TOOTHBRUSHING. SPIT AFTER RINSING, DO NOT SWALLOW   Continuous Glucose Sensor (FREESTYLE LIBRE 3 SENSOR) MISC Use as directed every 14 days   diclofenac (VOLTAREN) 75 MG EC tablet Take 1 tablet (75 mg total) by mouth 2 (two) times daily.   Empagliflozin-metFORMIN HCl (SYNJARDY) 12.06-998 MG TABS Take 1 tablet by  mouth 2 (two) times daily.   famotidine (PEPCID) 20 MG tablet Take 1 tablet (20 mg total) by mouth 2 (two) times daily.   fenofibrate micronized (LOFIBRA) 200 MG capsule Take 1 capsule (200 mg total) by mouth daily before breakfast.   fluticasone (FLONASE) 50 MCG/ACT nasal spray Place 2 sprays into both nostrils daily.   Insulin Glargine (BASAGLAR KWIKPEN) 100 UNIT/ML Inject 70 Units into the skin daily.   Insulin Pen Needle 32G X 4 MM MISC Use as directed to inject insulin in the afternoon   ipratropium (ATROVENT) 0.02 % nebulizer solution Take 2.5 mLs (0.5 mg total) by nebulization 4 (four) times daily.   ipratropium-albuterol (DUONEB) 0.5-2.5 (3) MG/3ML SOLN Take 3 mL by nebulization every 6 (six) hours as needed for wheezing.   lamoTRIgine (LAMICTAL) 100 MG tablet Take 1 tablet (100 mg total) by mouth daily.   lisinopril (ZESTRIL) 10 MG tablet Take 1 tablet (10 mg total) by mouth daily.   Melatonin 10 MG TABS Take 10 mg by mouth at bedtime.   QUEtiapine (SEROQUEL) 200 MG tablet Take 1 tablet (200 mg total) by mouth at bedtime.   Respiratory Therapy Supplies (TUBING/WING TIP) MISC Once daily   rosuvastatin (CRESTOR) 40 MG tablet Take 1 tablet (40 mg total) by mouth daily.   sildenafil (VIAGRA) 100 MG tablet Take 0.5-1 tablets (50-100 mg total) by mouth daily as needed for erectile dysfunction. *NEED APPT FOR FURTHER REFILLS*   Sodium Fluoride (CLINPRO 5000) 1.1 % PSTE Use pea size amount of toothpaste and brush for 2 mins and spit. Do not eat/drink/rinse for 30 mins   SYRINGE-NEEDLE, DISP, 3 ML (B-D 3CC LUER-LOK SYR 22GX1") 22G X 1" 3 ML MISC Use to inject testosterone   tamsulosin (FLOMAX) 0.4 MG CAPS capsule Take 2 capsules (0.8 mg total) by mouth daily.   testosterone enanthate (DELATESTRYL) 200 MG/ML injection Inject 0.25 mLs (50 mg total) into the muscle every 14 (fourteen) days.   tirzepatide [facility]) 5 MG/0.5ML Pen Inject 5 mg into the skin once a week.   [DISCONTINUED] traZODone  (DESYREL) 100 MG tablet Take 100 mg by mouth at bedtime.   No facility-administered encounter medications on file as of 03/10/2023.    Recent Results (from the past 2160 hours)  Culture, Group A Strep     Status: None   Collection Time: 12/11/22  4:55 PM   Specimen: Throat  Result Value Ref Range   Micro Number [id number]    SPECIMEN QUALITY: Adequate    SOURCE: NOT GIVEN    STATUS: FINAL    RESULT: No group A Streptococcus isolated   HM DIABETES EYE EXAM     Status: None   Collection Time: 01/22/23 12:13 PM  Result Value Ref Range   HM Diabetic Eye Exam No Retinopathy No Retinopathy    Comment: Abstracted by HIM     Psychiatric Specialty Exam: Physical Exam  Review of Systems  Weight 205 lb (93 kg).There is no height or weight on file to calculate BMI.  General Appearance: Casual  Eye Contact:  Fair  Speech:  Slow  Volume:  Decreased  Mood:  Euthymic  Affect:  Congruent  Thought Process:  Goal Directed  Orientation:  Full (Time, Place, and Person)  Thought Content:  Rumination  Suicidal Thoughts:  No  Homicidal Thoughts:  No  Memory:  Immediate;   Good Recent;   Good Remote;   Good  Judgement:  Intact  Insight:  Present  Psychomotor Activity:  Normal  Concentration:  Concentration: Good and Attention Span: Good  Recall:  Good  Fund of Knowledge:  Good  Language:  Good  Akathisia:  No  Handed:  Right  AIMS (if indicated):     Assets:  Communication Skills Desire for Improvement Housing Physical Health Resilience Social Support Talents/Skills Transportation  ADL's:  Intact  Cognition:  WNL  Sleep:  ok with Melatonin 10 mg     Assessment/Plan: MDD (major depressive disorder), recurrent episode, moderate (HCC) - Plan: lamoTRIgine (LAMICTAL) 150 MG tablet, QUEtiapine (SEROQUEL) 200 MG tablet  Chronic post-traumatic stress disorder (PTSD) - Plan: lamoTRIgine (LAMICTAL) 150 MG tablet, QUEtiapine (SEROQUEL) 200 MG tablet  I reviewed current medication.  His  medicines are changed while he was in [facility].  He is no longer taking trazodone, Zoloft, Ambien.  He is taking Seroquel 200 mg at bedtime along with Lamictal 100 mg daily.  He feels his mood is much better and he does not get upset.  He is less emotional.  He like to go up on Lamictal since he noticed improvement.  So far no rash, itching, tremors shakes or any EPS.  Recommend he can try Lamictal 150 but he need to watch carefully if he has any side effects.  We talked about therapy which he promised once he returned from [place] we will consider.  He like to build a relationship with his family and like to renovate his house which he recently bought.  He reported much improved communication with his wife and his son.  Continue Seroquel 200 mg at bedtime.  Taking melatonin 10 mg at bedtime he will try Lamictal 150 daily.  I recommend to call us back if is any question, concerns briefly worsening of the symptoms.  Patient told he has needs to connect with [place] from [place] if needed.  Follow-up in 10 weeks in person.   Follow Up Instructions:     I discussed the assessment and treatment plan with the patient. The patient was provided an opportunity to ask questions and all were answered. The patient agreed with the plan and demonstrated an understanding of the instructions.   The patient was advised to call back or seek an in-person evaluation if the symptoms worsen or if the condition fails to improve as anticipated.    Collaboration of Care: Other provider involved in patient's care AEB notes are available in epic to review  Patient/Guardian was advised Release of Information must be obtained prior to any record release in order to collaborate their care with an outside provider. Patient/Guardian was advised if they have not already done so to contact the registration department to sign all necessary forms in order for [place] to release information regarding their care.   Consent: Patient/Guardian  gives verbal consent for treatment and assignment of benefits for services provided during this visit. Patient/Guardian expressed understanding and agreed to proceed.  I provided 28 minutes of non face to face time during this encounter.  Note: This document was prepared by [employer] voice dictation technology and any errors that results from this process are unintentional.    Cleotis Nipper, MD 03/10/2023

## 2023-03-10 NOTE — Patient Instructions (Signed)
Continue Synjardy 12.06-998 mg TWO tablets every morning  Continue  mounjaro 5 mg weekly  Continue Basaglar 35 units once daily       HOW TO TREAT LOW BLOOD SUGARS (Blood sugar LESS THAN 70 MG/DL) Please follow the RULE OF 15 for the treatment of hypoglycemia treatment (when your (blood sugars are less than 70 mg/dL)   STEP 1: Take 15 grams of carbohydrates when your blood sugar is low, which includes:  3-4 GLUCOSE TABS  OR 3-4 OZ OF JUICE OR REGULAR SODA OR ONE TUBE OF GLUCOSE GEL    STEP 2: RECHECK blood sugar in 15 MINUTES STEP 3: If your blood sugar is still low at the 15 minute recheck --> then, go back to STEP 1 and treat AGAIN with another 15 grams of carbohydrates.

## 2023-03-10 NOTE — Progress Notes (Signed)
Name: William [name]  MRN/ DOB: [id number], 23-May-1960   Age/ Sex: 63 y.o., male    PCP: Sharlene Dory, DO   Reason for Endocrinology Evaluation: Type 2 Diabetes Mellitus     Date of Initial Endocrinology Visit: 06/18/2021    PATIENT IDENTIFIER: William [name] is a 63 y.o. male with a past medical history of HTN, T2DM, hypogonadism. The patient presented for initial endocrinology clinic visit on 06/18/2021 for consultative assistance with his diabetes management.    HPI: William [name] was    Diagnosed with DM 2000 Prior Medications tried/Intolerance:  Hemoglobin A1c has ranged from 6.5% in 2020, peaking at 9.9% in 2022.  On his initial visit to our clinic and an A1c of 7.7%, he was on [place], [place], and Hospital doctor  We switch Ozempic to [facility] 10/2022  TESTOSTERONE HISTORY: He has been noted with low testosterone since 2013, with a nadir of 123 NG/DL in 0272. He stopped taking testosterone ~ 2 months ago because it did not help with energy help.    He was evaluated by urology 01/2022 due to elevated PSA in the setting of testosterone therapy, no intervention was offered at the time, no prostate nodules were detected.  SUBJECTIVE:   During the last visit (11/01/2022): A1c 7.2%   Today (03/10/23): William [name] is here for a follow up on diabetes and hypogonadism management. He    checks his blood sugars multiple  times daily through freestyle libre . The patient has not had hypoglycemia .   Denies nausea or vomiting  Going to [place]  Monday Denies constipation     HOME ENDOCRINE  REGIMEN: Synjardy 12.06/998 mg 2 tabs daily Basaglar 35 units QAM Mounjaro 5 mg weekly Testosterone enanthate 200 mg/ML 50 mg (0.25 mL) weekly    Statin: Yes ACE-I/ARB: yes     CONTINUOUS GLUCOSE MONITORING RECORD INTERPRETATION: Patient declined download so he does not get charged   DIABETIC COMPLICATIONS: Microvascular complications:  Nonproliferative DR Denies: CKD,  neuropathy  Last eye exam: Completed 02/2020  Macrovascular complications:   Denies: CAD, PVD, CVA   PAST HISTORY: Past Medical History:  Past Medical History:  Diagnosis Date   Adjustment disorder with mixed anxiety and depressed mood    Arthritis    Asthma    history   Diabetes mellitus    type 2   Elevated blood pressure    history of high blood pressure readings   History of chicken pox    Hypercholesteremia    Hypertension    Internal hemorrhoids    Tubular adenoma of colon    Past Surgical History:  Past Surgical History:  Procedure Laterality Date   ELBOW SURGERY     right elbow 2010, left elbow 08/06/10 William [name])   KNEE SURGERY     right knee new ACL- 5366,4403   KNEE SURGERY     left knee 2003   NASAL SEPTUM SURGERY     1992   PARTIAL HIP ARTHROPLASTY     right hip replacement    Social History:  reports that he has never smoked. He has never used smokeless tobacco. He reports current alcohol use of about 7.0 - 14.0 standard drinks of alcohol per week. He reports that he does not use drugs. Family History:  Family History  Problem Relation Age of Onset   Diabetes Mother    Thyroid disease Mother        Questionable   Heart disease Father  deceased   Other Neg Hx        hypogonadism   Colon cancer Neg Hx      HOME MEDICATIONS: Allergies as of 03/10/2023   No Known Allergies      Medication List        Accurate as of March 10, 2023  3:03 PM. If you have any questions, ask your nurse or doctor.          STOP taking these medications    sertraline 50 MG tablet Commonly known as: ZOLOFT Stopped by: William [name]   traZODone 100 MG tablet Commonly known as: DESYREL Stopped by: William [name]       TAKE these medications    apixaban 5 MG Tabs tablet Commonly known as: Eliquis Take 1 tablet (5 mg total) by mouth 2 (two) times daily.   B-D 3CC LUER-LOK SYR 22GX1" 22G X 1" 3 ML Misc Generic drug: SYRINGE-NEEDLE (DISP)  3 ML Use to inject testosterone   Basaglar KwikPen 100 UNIT/ML Inject 35 Units into the skin daily.   carvedilol 12.5 MG tablet Commonly known as: COREG Take 1 tablet (12.5 mg total) by mouth in the morning and 1 tablet (12.5 mg total) in the evening. Take with meals.   chlorhexidine 0.12 % solution Commonly known as: Periogard RINSE MOUTH WITH (1 CAPFUL) FOR 30 SECONDS AM AND PM AFTER TOOTHBRUSHING. SPIT AFTER RINSING, DO NOT SWALLOW   Clinpro 5000 1.1 % Pste Generic drug: Sodium Fluoride Use pea size amount of toothpaste and brush for 2 mins and spit. Do not eat/drink/rinse for 30 mins   diclofenac 75 MG EC tablet Commonly known as: VOLTAREN Take 1 tablet (75 mg total) by mouth 2 (two) times daily.   famotidine 20 MG tablet Commonly known as: PEPCID Take 1 tablet (20 mg total) by mouth 2 (two) times daily.   fenofibrate micronized 200 MG capsule Commonly known as: LOFIBRA Take 1 capsule (200 mg total) by mouth daily before breakfast.   fluticasone 50 MCG/ACT nasal spray Commonly known as: FLONASE Place 2 sprays into both nostrils daily.   FreeStyle Libre 3 Plus Sensor Misc 1 Device by Other route every 14 (fourteen) days. Change sensor every 15 days. What changed:  how to take this additional instructions Changed by: William [name]   Insulin Pen Needle 32G X 4 MM Misc Use as directed to inject insulin in the afternoon   ipratropium 0.02 % nebulizer solution Commonly known as: ATROVENT Take 2.5 mLs (0.5 mg total) by nebulization 4 (four) times daily.   ipratropium-albuterol 0.5-2.5 (3) MG/3ML Soln Commonly known as: DUONEB Take 3 mL by nebulization every 6 (six) hours as needed for wheezing.   lamoTRIgine 150 MG tablet Commonly known as: LAMICTAL Take 1 tablet (150 mg total) by mouth daily. What changed:  medication strength how much to take Changed by: William [name]   lisinopril 10 MG tablet Commonly known as: ZESTRIL Take 1 tablet (10 mg  total) by mouth daily.   Melatonin 10 MG Tabs Take 10 mg by mouth at bedtime.   QUEtiapine 200 MG tablet Commonly known as: SEROQUEL Take 1 tablet (200 mg total) by mouth at bedtime.   rosuvastatin 40 MG tablet Commonly known as: CRESTOR Take 1 tablet (40 mg total) by mouth daily.   sildenafil 100 MG tablet Commonly known as: VIAGRA Take 0.5-1 tablets (50-100 mg total) by mouth daily as needed for erectile dysfunction. *NEED APPT FOR FURTHER REFILLS*   Synjardy  12.06-998 MG Tabs Generic drug: Empagliflozin-metFORMIN HCl Take 1 tablet by mouth 2 (two) times daily.   tamsulosin 0.4 MG Caps capsule Commonly known as: FLOMAX Take 2 capsules (0.8 mg total) by mouth daily.   testosterone enanthate 200 MG/ML injection Commonly known as: DELATESTRYL Inject 0.25 mLs (50 mg total) into the muscle once a week. What changed: when to take this Changed by: William Ou Jaloni Davoli   tirzepatide 5 MG/0.5ML Pen Commonly known as: MOUNJARO Inject 5 mg into the skin once a week.   Tubing/Wing Tip Misc Once daily         ALLERGIES: No Known Allergies   REVIEW OF SYSTEMS: A comprehensive ROS was conducted with the patient and is negative except as per HPI     OBJECTIVE:   VITAL SIGNS: BP 124/76 (BP Location: Left Arm, Patient Position: Sitting, Cuff Size: Normal)   Pulse 94   Ht 6\' 1"  (1.854 m)   Wt 200 lb (90.7 kg)   SpO2 98%   BMI 26.39 kg/m    PHYSICAL EXAM:  General: Pt appears well and is in NAD  Lungs: Clear with good BS bilat  Heart: RRR   Extremities:  Lower extremities - No pretibial edema.  Neuro: MS is good with appropriate affect, pt is alert and Ox3    DM foot exam:07/29/2022 Per podiatry      DATA REVIEWED:  Lab Results  Component Value Date   HGBA1C 6.7 (A) 03/10/2023   HGBA1C 7.2 (A) 11/01/2022   HGBA1C 7.7 (A) 05/01/2022     Latest Reference Range & Units 11/01/22 14:40  Testosterone, Total, LC-MS-MS 250 - 1,100 ng/dL 811     Latest  Reference Range & Units 11/01/22 14:40  Sodium 135 - 146 mmol/L 136  Potassium 3.5 - 5.3 mmol/L 4.5  Chloride 98 - 110 mmol/L 102  CO2 20 - 32 mmol/L 24  Glucose 65 - 99 mg/dL 914 (H)  BUN 7 - 25 mg/dL 15  Creatinine 7.82 - 9.56 mg/dL 2.13  Calcium 8.6 - 08.6 mg/dL 9.7  BUN/Creatinine Ratio 6 - 22 (calc) SEE NOTE:  Total CHOL/HDL Ratio <5.0 (calc) 7.1 (H)  Cholesterol <200 mg/dL 578 (H)  HDL Cholesterol > OR = 40 mg/dL 33 (L)  LDL Cholesterol (Calc) mg/dL (calc) Pend  Non-HDL Cholesterol (Calc) <130 mg/dL (calc) 469 (H)  Triglycerides <150 mg/dL 629 (H)    Latest Reference Range & Units 11/01/22 14:40  WBC 3.8 - 10.8 Thousand/uL 7.6  RBC 4.20 - 5.80 Million/uL 5.44  Hemoglobin 13.2 - 17.1 g/dL 52.8  HCT 41.3 - 24.4 % 48.4  MCV 80.0 - 100.0 fL 89.0  MCH 27.0 - 33.0 pg 29.0  MCHC 32.0 - 36.0 g/dL 01.0  RDW 27.2 - 53.6 % 14.6  Platelets 140 - 400 Thousand/uL 205  MPV 7.5 - 12.5 fL 11.2    Latest Reference Range & Units 11/01/22 14:40  PSA < OR = 4.00 ng/mL 0.54    Latest Reference Range & Units 11/01/22 14:53  Microalb, Ur mg/dL 0.3  MICROALB/CREAT RATIO <30 mg/g creat 5  Creatinine, Urine 20 - 320 mg/dL 59    ASSESSMENT / PLAN / RECOMMENDATIONS:   1) Type 2 Diabetes Mellitus, Optimally controlled, With retinopathic complications - Most recent A1c of 6.7 %. Goal A1c <7.0%.     -A1c optimal -Encouraged patient to continue with lifestyle changes and weight loss -He held his insulin yesterday, and today he took 35 units due to hypoglycemia, will continue current dose of  insulin, patient advised to reduce insulin dose by 4-5 units with hypoglycemic episodes -He opted to remain on current dose of Mounjaro  MEDICATIONS: Continue Synjardy 12.06-998 mg TWO Tabs QAM  Continue Mounjaro 5 mg weekly Take Basaglar 35 units daily   EDUCATION / INSTRUCTIONS: BG monitoring instructions: Patient is instructed to check his blood sugars 3 times a day, before meals . Call [place]  Endocrinology clinic if: BG persistently < 70  I reviewed the Rule of 15 for the treatment of hypoglycemia in detail with the patient. Literature supplied.   2) Diabetic complications:  Eye: Does  have known diabetic retinopathy.  Neuro/ Feet: Does not have known diabetic peripheral neuropathy. Renal: Patient does not have known baseline CKD. He is not on an ACEI/ARB at present  3) Hypogonadism:  -He was evaluated by urology 01/2022 for elevated PSA by >1.4 , no prostate nodules were detected -Last testosterone was within normal range, no change  Medication  Testosterone 200 mg/ML, 50 mg (0.25 mL) weekly  4) Dyslipidemia :  -Triglycerides have been  above goal -Patient on rosuvastatin, and  fenofibrate   Medication  rosuvastatin 40 mg daily fenofibrate 200 mg daily  Follow-up in 6 months     Signed electronically by: Lyndle Herrlich, MD  [facility] Endocrinology  [facility] [address] [place] [place], [place] [address] Phone: [phone] FAX: [phone]   CC: Sharlene Dory, DO [address] [place] [address] Phone: [phone]  Fax: [phone]    Return to Endocrinology clinic as below: Future Appointments  Date Time Provider Department Center  03/12/2023  1:00 PM Rushie Chestnut, PA-C [facility] None  03/12/2023  2:00 PM [facility] LAB [facility] None  09/08/2023  2:00 PM Jennfer Gassen, Konrad Dolores, MD [facility] None

## 2023-03-11 ENCOUNTER — Other Ambulatory Visit (HOSPITAL_BASED_OUTPATIENT_CLINIC_OR_DEPARTMENT_OTHER): Payer: Self-pay

## 2023-03-11 DIAGNOSIS — F3175 Bipolar disorder, in partial remission, most recent episode depressed: Secondary | ICD-10-CM | POA: Diagnosis not present

## 2023-03-12 ENCOUNTER — Inpatient Hospital Stay: Payer: Federal, State, Local not specified - PPO

## 2023-03-12 ENCOUNTER — Other Ambulatory Visit (HOSPITAL_BASED_OUTPATIENT_CLINIC_OR_DEPARTMENT_OTHER): Payer: Self-pay

## 2023-03-12 ENCOUNTER — Inpatient Hospital Stay: Payer: Federal, State, Local not specified - PPO | Attending: Medical Oncology | Admitting: Medical Oncology

## 2023-03-12 VITALS — BP 131/72 | HR 86 | Temp 98.0°F | Resp 17 | Ht 72.0 in | Wt 201.0 lb

## 2023-03-12 DIAGNOSIS — Z79899 Other long term (current) drug therapy: Secondary | ICD-10-CM | POA: Insufficient documentation

## 2023-03-12 DIAGNOSIS — Z8719 Personal history of other diseases of the digestive system: Secondary | ICD-10-CM | POA: Diagnosis not present

## 2023-03-12 DIAGNOSIS — Z8349 Family history of other endocrine, nutritional and metabolic diseases: Secondary | ICD-10-CM | POA: Insufficient documentation

## 2023-03-12 DIAGNOSIS — I2699 Other pulmonary embolism without acute cor pulmonale: Secondary | ICD-10-CM | POA: Diagnosis not present

## 2023-03-12 DIAGNOSIS — Z7289 Other problems related to lifestyle: Secondary | ICD-10-CM | POA: Insufficient documentation

## 2023-03-12 DIAGNOSIS — Z860101 Personal history of adenomatous and serrated colon polyps: Secondary | ICD-10-CM | POA: Insufficient documentation

## 2023-03-12 DIAGNOSIS — Z7901 Long term (current) use of anticoagulants: Secondary | ICD-10-CM | POA: Diagnosis not present

## 2023-03-12 DIAGNOSIS — F3175 Bipolar disorder, in partial remission, most recent episode depressed: Secondary | ICD-10-CM | POA: Diagnosis not present

## 2023-03-12 DIAGNOSIS — Z833 Family history of diabetes mellitus: Secondary | ICD-10-CM | POA: Diagnosis not present

## 2023-03-12 DIAGNOSIS — Z8249 Family history of ischemic heart disease and other diseases of the circulatory system: Secondary | ICD-10-CM | POA: Insufficient documentation

## 2023-03-12 DIAGNOSIS — I10 Essential (primary) hypertension: Secondary | ICD-10-CM | POA: Diagnosis not present

## 2023-03-12 MED ORDER — APIXABAN 5 MG PO TABS
5.0000 mg | ORAL_TABLET | Freq: Two times a day (BID) | ORAL | 0 refills | Status: DC
  Filled 2023-03-12: qty 60, 30d supply, fill #0
  Filled 2023-03-12: qty 180, 90d supply, fill #0

## 2023-03-12 NOTE — Progress Notes (Signed)
 [facility] [phone]   [phone]  INITIAL CONSULT NOTE  Patient Care Team: Frann Mabel Mt, DO as PCP - General (Family Medicine)  CHIEF COMPLAINTS/PURPOSE OF CONSULTATION:  Pulmonary Embolism  HISTORY OF PRESENTING ILLNESS:  William [name] 63 y.o. male is referred to our office by their PCP Dr. Frann for evaluation and duration advisement of anticoagulation for a recently diagnosed pulmonary embolism.   He was diagnosed with a pulmonary embolism on 11/29/2023 at [facility]. Prior to that he had been inpatient from 11/18/2022-12/06/2022 for altered mental status following a benzodiazepine overdose. During this stay he had an event of acute hypoxic respiratory failure and was found to have a PE without acute cor pulmonale. He was started on Eliquis  10 mg BID x 7 days then 5mg  BID. He had a repeat CTA on 12/11/2022 which showed resolution of the PE. He has had CBC/CMP since which were unremarkable.   He reports that he has been taking his Eliquis  as directed. He denies any side effects to this medication.   There has been no bleeding to his knowledge: denies epistaxis, gingivitis, hemoptysis, hematemesis, hematuria, melena, excessive bruising, blood donation.   In terms of additional risk factors, He is not a smoker and fortunately has no history of cancer. He is however currently taking testosterone  supplementation. This is managed by Dr. Sam with endocrinology.    No clotting diseases in the family No history of MI,stroke,PE/DVT prior.   MEDICAL HISTORY:  Past Medical History:  Diagnosis Date   Adjustment disorder with mixed anxiety and depressed mood    Arthritis    Asthma    history   Diabetes mellitus    type 2   Elevated blood pressure    history of high blood pressure readings   History of chicken pox    Hypercholesteremia    Hypertension    Internal hemorrhoids    Tubular adenoma of colon     SURGICAL  HISTORY: Past Surgical History:  Procedure Laterality Date   ELBOW SURGERY     right elbow 2010, left elbow 08/06/10 Rodman Schimke)   KNEE SURGERY     right knee new ACL- [phone]   KNEE SURGERY     left knee 2003   NASAL SEPTUM SURGERY     1992   PARTIAL HIP ARTHROPLASTY     right hip replacement    SOCIAL HISTORY: Social History   Socioeconomic History   Marital status: Married    Spouse name: Not on file   Number of children: Not on file   Years of education: Not on file   Highest education level: Not on file  Occupational History   Not on file  Tobacco Use   Smoking status: Never   Smokeless tobacco: Never  Substance and Sexual Activity   Alcohol use: Yes    Alcohol/week: 7.0 - 14.0 standard drinks of alcohol    Types: 7 - 14 Glasses of wine per week   Drug use: No   Sexual activity: Not on file  Other Topics Concern   Not on file  Social History Narrative   Occupation: Real Therapist, Occupational   Married -15 marriage (2nd marriage)   daughter 69,  2 sons (2nd marriage  11,6)   [place]   Never Smoked    Alcohol use-yes   Drug use-no    Regular exercise-no   Smoking Status:  never   Does Patient Exercise:  no   Caffeine use/day:  3-4 cups  coffee daily   Drug Use:  no         Social Drivers of Corporate Investment Banker Strain: Not on file  Food Insecurity: Low Risk  (11/27/2022)   Received from [facility]   Hunger Vital Sign    Worried About Running Out of Food in the Last Year: Never true    Ran Out of Food in the Last Year: Never true  Transportation Needs: No Transportation Needs (11/27/2022)   Received from [facility]    In the past 12 months, has lack of reliable transportation kept you from medical appointments, meetings, work or from getting things needed for daily living? : No  Physical Activity: Not on file  Stress: Not on file  Social Connections: Not on file  Intimate Partner Violence: Not on file    FAMILY  HISTORY: Family History  Problem Relation Age of Onset   Diabetes Mother    Thyroid  disease Mother        Questionable   Heart disease Father        deceased   Other Neg Hx        hypogonadism   Colon cancer Neg Hx     ALLERGIES:  has no known allergies.  MEDICATIONS:  Current Outpatient Medications  Medication Sig Dispense Refill   apixaban  (ELIQUIS ) 5 MG TABS tablet Take 1 tablet (5 mg total) by mouth 2 (two) times daily. 60 tablet 0   apixaban  (ELIQUIS ) 5 MG TABS tablet Take 1 tablet (5 mg total) by mouth 2 (two) times daily. 180 tablet 0   carvedilol  (COREG ) 12.5 MG tablet Take 1 tablet (12.5 mg total) by mouth in the morning and 1 tablet (12.5 mg total) in the evening. Take with meals. 180 tablet 1   chlorhexidine  (PERIOGARD ) 0.12 % solution RINSE MOUTH WITH (1 CAPFUL) FOR 30 SECONDS AM AND PM AFTER TOOTHBRUSHING. SPIT AFTER RINSING, DO NOT SWALLOW 473 mL 3   Continuous Glucose Sensor (FREESTYLE LIBRE 3 PLUS SENSOR) MISC 1 Device by Other route every 14 (fourteen) days. Change sensor every 15 days. 6 each 3   diclofenac  (VOLTAREN ) 75 MG EC tablet Take 1 tablet (75 mg total) by mouth 2 (two) times daily. 50 tablet 2   Empagliflozin -metFORMIN  HCl (SYNJARDY ) 12.06-998 MG TABS Take 1 tablet by mouth 2 (two) times daily. 180 tablet 3   famotidine  (PEPCID ) 20 MG tablet Take 1 tablet (20 mg total) by mouth 2 (two) times daily. 180 tablet 1   fenofibrate  micronized (LOFIBRA) 200 MG capsule Take 1 capsule (200 mg total) by mouth daily before breakfast. 90 capsule 3   fluticasone  (FLONASE ) 50 MCG/ACT nasal spray Place 2 sprays into both nostrils daily. 16 g 1   Insulin  Glargine (BASAGLAR  KWIKPEN) 100 UNIT/ML Inject 35 Units into the skin daily. 45 mL 3   Insulin  Pen Needle 29G X 12.7MM MISC Use as directed for insulin  injection. 100 each 3   ipratropium (ATROVENT ) 0.02 % nebulizer solution Take 2.5 mLs (0.5 mg total) by nebulization 4 (four) times daily. 75 mL 12    ipratropium-albuterol  (DUONEB) 0.5-2.5 (3) MG/3ML SOLN Take 3 mL by nebulization every 6 (six) hours as needed for wheezing. 180 mL 0   lamoTRIgine  (LAMICTAL ) 150 MG tablet Take 1 tablet (150 mg total) by mouth daily. 90 tablet 0   lisinopril  (ZESTRIL ) 10 MG tablet Take 1 tablet (10 mg total) by mouth daily. 90 tablet 1   Melatonin 10 MG TABS Take  10 mg by mouth at bedtime.     QUEtiapine  (SEROQUEL ) 200 MG tablet Take 1 tablet (200 mg total) by mouth at bedtime. 90 tablet 0   Respiratory Therapy Supplies (TUBING/WING TIP) MISC Once daily 1 each 8   rosuvastatin  (CRESTOR ) 40 MG tablet Take 1 tablet (40 mg total) by mouth daily. 90 tablet 3   sildenafil  (VIAGRA ) 100 MG tablet Take 0.5-1 tablets (50-100 mg total) by mouth daily as needed for erectile dysfunction. *NEED APPT FOR FURTHER REFILLS* 90 tablet 2   Sodium Fluoride  (CLINPRO  5000) 1.1 % PSTE Use pea size amount of toothpaste and brush for 2 mins and spit. Do not eat/drink/rinse for 30 mins 100 mL 3   SYRINGE-NEEDLE, DISP, 3 ML (B-D 3CC LUER-LOK SYR 22GX1) 22G X 1 3 ML MISC Use to inject testosterone  10 each 3   tamsulosin  (FLOMAX ) 0.4 MG CAPS capsule Take 2 capsules (0.8 mg total) by mouth daily. 180 capsule 1   testosterone  enanthate (DELATESTRYL ) 200 MG/ML injection Inject 0.25 mLs (50 mg total) into the muscle once a week. 15 mL 0   tirzepatide  (MOUNJARO ) 5 MG/0.5ML Pen Inject 5 mg into the skin once a week. 6 mL 3   No current facility-administered medications for this visit.    REVIEW OF SYSTEMS:   Constitutional: ( - ) fevers, ( - )  chills , ( - ) night sweats Eyes: ( - ) blurriness of vision, ( - ) double vision, ( - ) watery eyes Ears, nose, mouth, throat, and face: ( - ) mucositis, ( - ) sore throat Respiratory: ( - ) cough, ( - ) dyspnea, ( - ) wheezes Cardiovascular: ( - ) palpitation, ( - ) chest discomfort, ( - ) lower extremity swelling Gastrointestinal:  ( - ) nausea, ( - ) heartburn, ( - ) change in bowel habits Skin:  ( - ) abnormal skin rashes Lymphatics: ( - ) new lymphadenopathy, ( - ) easy bruising Neurological: ( - ) numbness, ( - ) tingling, ( - ) new weaknesses Behavioral/Psych: ( - ) mood change, ( - ) new changes  All other systems were reviewed with the patient and are negative.  PHYSICAL EXAMINATION: ECOG PERFORMANCE STATUS: 0 - Asymptomatic  Vitals:   03/12/23 1248  BP: 131/72  Pulse: 86  Resp: 17  Temp: 98 F (36.7 C)  SpO2: 100%   Filed Weights   03/12/23 1248  Weight: 201 lb (91.2 kg)    GENERAL: well appearing male in NAD  SKIN: skin color, texture, turgor are normal, no rashes or significant lesions EYES: conjunctiva are pink and non-injected, sclera clear OROPHARYNX: Lips, buccal mucosa, and tongue normal  NECK: supple, non-tender LYMPH:  no palpable lymphadenopathy in the cervical, axillary or supraclavicular lymph nodes.  LUNGS: clear to auscultation and percussion with normal breathing effort HEART: regular rate & rhythm and no murmurs and no lower extremity edema ABDOMEN: soft, non-tender, non-distended, Musculoskeletal: no cyanosis of digits and no clubbing  PSYCH: alert & oriented x 3, fluent speech NEURO: no focal motor/sensory deficits   ASSESSMENT & PLAN William [name] is a 63 y.o. male who was referred to us  for discussion of Eliquis  use duration.   I have advised patient that I would recommend 1 year of anticoagulation therapy as this appears to be a provoked clotting event. I did discuss with him that his testosterone  use can increase his likelihood of clotting events so I will discuss this further with his endocrinologist at the time  of potential discontinuation. We also discussed obtaining a hypercoagulable work up prior to discontinuation as well.   We will plan on seeing him back in the fall with labs.    All questions were answered. The patient knows to call the clinic with any problems, questions or concerns.  I have spent a total of 40 minutes  minutes of face-to-face and non-face-to-face time, preparing to see the patient, obtaining and/or reviewing separately obtained history, performing a medically appropriate examination, counseling and educating the patient, ordering medications/tests/procedures, referring and communicating with other health care professionals, documenting clinical information in the electronic health record, independently interpreting results and communicating results to the patient, and care coordination.    Lauraine Dais PA-C Department of Hematology/Oncology [facility] at [facility]

## 2023-03-13 ENCOUNTER — Other Ambulatory Visit (HOSPITAL_BASED_OUTPATIENT_CLINIC_OR_DEPARTMENT_OTHER): Payer: Self-pay

## 2023-03-13 DIAGNOSIS — F3175 Bipolar disorder, in partial remission, most recent episode depressed: Secondary | ICD-10-CM | POA: Diagnosis not present

## 2023-04-03 ENCOUNTER — Encounter: Payer: Self-pay | Admitting: Podiatry

## 2023-04-08 NOTE — Telephone Encounter (Signed)
 Yes he can try Lamictal 200 mg daily.  He needs to watch carefully if he has a rash then he need to inform us immediately.  Please call the medication to his pharmacy if he agreed to take 200 mg.

## 2023-05-22 ENCOUNTER — Other Ambulatory Visit: Payer: Self-pay | Admitting: Internal Medicine

## 2023-05-22 ENCOUNTER — Other Ambulatory Visit (HOSPITAL_BASED_OUTPATIENT_CLINIC_OR_DEPARTMENT_OTHER): Payer: Self-pay

## 2023-05-22 ENCOUNTER — Other Ambulatory Visit: Payer: Self-pay | Admitting: Family Medicine

## 2023-05-22 MED ORDER — SILDENAFIL CITRATE 100 MG PO TABS
50.0000 mg | ORAL_TABLET | Freq: Every day | ORAL | 2 refills | Status: DC | PRN
  Filled 2023-05-22 (×2): qty 90, 90d supply, fill #0

## 2023-05-23 ENCOUNTER — Other Ambulatory Visit (HOSPITAL_BASED_OUTPATIENT_CLINIC_OR_DEPARTMENT_OTHER): Payer: Self-pay

## 2023-05-26 ENCOUNTER — Other Ambulatory Visit (HOSPITAL_BASED_OUTPATIENT_CLINIC_OR_DEPARTMENT_OTHER): Payer: Self-pay

## 2023-05-26 MED ORDER — TESTOSTERONE ENANTHATE 200 MG/ML IM SOLN
50.0000 mg | INTRAMUSCULAR | 5 refills | Status: DC
  Filled 2023-05-26: qty 5, 90d supply, fill #0
  Filled 2023-08-28: qty 5, 30d supply, fill #1

## 2023-05-27 ENCOUNTER — Other Ambulatory Visit (HOSPITAL_BASED_OUTPATIENT_CLINIC_OR_DEPARTMENT_OTHER): Payer: Self-pay

## 2023-05-28 ENCOUNTER — Other Ambulatory Visit (HOSPITAL_COMMUNITY): Payer: Self-pay | Admitting: Psychiatry

## 2023-05-28 ENCOUNTER — Other Ambulatory Visit (HOSPITAL_BASED_OUTPATIENT_CLINIC_OR_DEPARTMENT_OTHER): Payer: Self-pay

## 2023-05-28 ENCOUNTER — Other Ambulatory Visit: Payer: Self-pay

## 2023-05-28 ENCOUNTER — Other Ambulatory Visit: Payer: Self-pay | Admitting: Family Medicine

## 2023-05-28 DIAGNOSIS — F331 Major depressive disorder, recurrent, moderate: Secondary | ICD-10-CM

## 2023-05-28 DIAGNOSIS — F4312 Post-traumatic stress disorder, chronic: Secondary | ICD-10-CM

## 2023-05-28 MED ORDER — LISINOPRIL 10 MG PO TABS
10.0000 mg | ORAL_TABLET | Freq: Every day | ORAL | 1 refills | Status: DC
  Filled 2023-05-28: qty 90, 90d supply, fill #0

## 2023-05-28 MED ORDER — CARVEDILOL 12.5 MG PO TABS
12.5000 mg | ORAL_TABLET | Freq: Two times a day (BID) | ORAL | 1 refills | Status: DC
  Filled 2023-05-28: qty 180, 90d supply, fill #0
  Filled 2023-08-19 (×2): qty 180, 90d supply, fill #1
  Filled 2023-08-19: qty 120, 60d supply, fill #1

## 2023-05-29 ENCOUNTER — Other Ambulatory Visit (HOSPITAL_BASED_OUTPATIENT_CLINIC_OR_DEPARTMENT_OTHER): Payer: Self-pay

## 2023-06-02 DIAGNOSIS — E119 Type 2 diabetes mellitus without complications: Secondary | ICD-10-CM | POA: Diagnosis not present

## 2023-06-02 DIAGNOSIS — H40013 Open angle with borderline findings, low risk, bilateral: Secondary | ICD-10-CM | POA: Diagnosis not present

## 2023-06-02 DIAGNOSIS — H2513 Age-related nuclear cataract, bilateral: Secondary | ICD-10-CM | POA: Diagnosis not present

## 2023-06-06 ENCOUNTER — Encounter (HOSPITAL_COMMUNITY): Payer: Self-pay

## 2023-06-06 ENCOUNTER — Other Ambulatory Visit (HOSPITAL_BASED_OUTPATIENT_CLINIC_OR_DEPARTMENT_OTHER): Payer: Self-pay

## 2023-06-06 ENCOUNTER — Telehealth (HOSPITAL_COMMUNITY): Admitting: Psychiatry

## 2023-06-06 ENCOUNTER — Encounter (HOSPITAL_COMMUNITY): Payer: Self-pay | Admitting: Psychiatry

## 2023-06-06 VITALS — Wt 205.0 lb

## 2023-06-06 DIAGNOSIS — F331 Major depressive disorder, recurrent, moderate: Secondary | ICD-10-CM

## 2023-06-06 DIAGNOSIS — F4312 Post-traumatic stress disorder, chronic: Secondary | ICD-10-CM | POA: Diagnosis not present

## 2023-06-06 DIAGNOSIS — Z630 Problems in relationship with spouse or partner: Secondary | ICD-10-CM

## 2023-06-06 MED ORDER — QUETIAPINE FUMARATE 200 MG PO TABS
200.0000 mg | ORAL_TABLET | Freq: Every day | ORAL | 0 refills | Status: DC
Start: 2023-06-06 — End: 2023-08-21
  Filled 2023-06-06: qty 90, 90d supply, fill #0

## 2023-06-06 MED ORDER — LAMOTRIGINE 200 MG PO TABS
200.0000 mg | ORAL_TABLET | Freq: Every day | ORAL | 0 refills | Status: DC
  Filled 2023-06-06: qty 90, 90d supply, fill #0

## 2023-06-06 NOTE — Progress Notes (Signed)
 [facility] MD Virtual Progress Note   Patient Location: Home Provider Location: Home Office  I connect with patient by video and verified that I am speaking with correct person by using two identifiers. I discussed the limitations of evaluation and management by telemedicine and the availability of in person appointments. I also discussed with the patient that there may be a patient responsible charge related to this service. The patient expressed understanding and agreed to proceed.  William [name] [id number] 63 y.o.  06/06/2023 11:40 AM  History of Present Illness:  Patient is evaluated by video session.  He now moved into town home is living separately from his wife because at this time wife does not want to live with him.  However patient is still very close to his wife and have did not almost every night and they go together to places.  Patient also comes to home and take care of the house, pets and if wife need to go then he stays home.  Patient told he also started marriage counseling.  Patient told he realized that in 20 years of marriage he was very difficult on his wife but now trying to have a better relationship with the wife.  He reported making progress slowly and gradually.  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  He feels very good about his ACT.  Patient told he is sleeping better with the help of Seroquel  and Lamictal .  We have increased the Lamictal  on the last visit and he noticed helping his mood.  Patient told his son who has psychiatric issues is now seeing therapist.  Patient told he has some issues with his son but hoping he will get better.  Patient told he told his son to consider taking the Lamictal  since he noticed much improvement in his mood.  Patient also like to have his son to be seen by psychiatrist.  Denies any suicidal thoughts or homicidal thoughts.  He denies any mania,  anger, irritability or any feeling of hopelessness.  He is on medication to help his blood sugar and recently his hemoglobin A1c is 6.7.  Patient told his birthday is coming and he is hoping to start going to gym on a regular basis.  He denies any nightmares or flashback.  He has no rash or any itching.  Past Psychiatric History: H/O depression, anxiety and ADHD diagnosed by Dr. Onesimo Bijou.  Tried Wellbutrin  but did not continued for a long time.  Started medication in 2000 by PCP.  Took Paxil but stopped due to sexual side effects, took Seroquel , Xanax , Zoloft  and Seroquel  from PCP.  History of PTSD. H/O inpatient at [facility] in October 2024 after intentional overdose on Xanax .  He did PHP at [facility] in [place]  and then in [place]  and recently in [place].  Ambien, trazodone  and Zoloft  discontinued in PHP.   Outpatient Encounter Medications as of 06/06/2023  Medication Sig   apixaban  (ELIQUIS ) 5 MG TABS tablet Take 1 tablet (5 mg total) by mouth 2 (two) times daily.   apixaban  (ELIQUIS ) 5 MG TABS tablet Take 1 tablet (5 mg total) by mouth 2 (two) times daily.   carvedilol  (COREG ) 12.5 MG tablet Take 1 tablet (12.5 mg total) by mouth in the morning and 1 tablet (12.5 mg total) in the evening. Take with meals.   chlorhexidine  (PERIOGARD ) 0.12 % solution RINSE MOUTH WITH (1 CAPFUL)  FOR 30 SECONDS AM AND PM AFTER TOOTHBRUSHING. SPIT AFTER RINSING, DO NOT SWALLOW   Continuous Glucose Sensor (FREESTYLE LIBRE 3 PLUS SENSOR) MISC Use 1 sensor every 15 days as directed.   diclofenac  (VOLTAREN ) 75 MG EC tablet Take 1 tablet (75 mg total) by mouth 2 (two) times daily.   Empagliflozin -metFORMIN  HCl (SYNJARDY ) 12.06-998 MG TABS Take 1 tablet by mouth 2 (two) times daily.   famotidine  (PEPCID ) 20 MG tablet Take 1 tablet (20 mg total) by mouth 2 (two) times daily.   fenofibrate  micronized (LOFIBRA) 200 MG capsule Take 1 capsule (200 mg total) by mouth daily before breakfast.   fluticasone  (FLONASE ) 50  MCG/ACT nasal spray Place 2 sprays into both nostrils daily.   Insulin  Glargine (BASAGLAR  KWIKPEN) 100 UNIT/ML Inject 35 Units into the skin daily.   Insulin  Pen Needle 29G X 12.7MM MISC Use as directed for insulin  injection.   ipratropium (ATROVENT ) 0.02 % nebulizer solution Take 2.5 mLs (0.5 mg total) by nebulization 4 (four) times daily.   ipratropium-albuterol  (DUONEB) 0.5-2.5 (3) MG/3ML SOLN Take 3 mL by nebulization every 6 (six) hours as needed for wheezing.   lamoTRIgine  (LAMICTAL ) 150 MG tablet Take 1 tablet (150 mg total) by mouth daily.   lisinopril  (ZESTRIL ) 10 MG tablet Take 1 tablet (10 mg total) by mouth daily.   Melatonin 10 MG TABS Take 10 mg by mouth at bedtime.   QUEtiapine  (SEROQUEL ) 200 MG tablet Take 1 tablet (200 mg total) by mouth at bedtime.   Respiratory Therapy Supplies (TUBING/WING TIP) MISC Once daily   rosuvastatin  (CRESTOR ) 40 MG tablet Take 1 tablet (40 mg total) by mouth daily.   sildenafil  (VIAGRA ) 100 MG tablet Take 0.5-1 tablets (50-100 mg total) by mouth daily as needed for erectile dysfunction. *NEED APPT FOR FURTHER REFILLS*   Sodium Fluoride  (CLINPRO  5000) 1.1 % PSTE Use pea size amount of toothpaste and brush for 2 mins and spit. Do not eat/drink/rinse for 30 mins   SYRINGE-NEEDLE, DISP, 3 ML (B-D 3CC LUER-LOK SYR 22GX1") 22G X 1" 3 ML MISC Use to inject testosterone    tamsulosin  (FLOMAX ) 0.4 MG CAPS capsule Take 2 capsules (0.8 mg total) by mouth daily.   testosterone  enanthate (DELATESTRYL ) 200 MG/ML injection Inject 0.25 mLs (50 mg total) into the muscle once a week.   tirzepatide  (MOUNJARO ) 5 MG/0.5ML Pen Inject 5 mg into the skin once a week.   No facility-administered encounter medications on file as of 06/06/2023.    Recent Results (from the past 2160 hours)  POCT glycosylated hemoglobin (Hb A1C)     Status: Abnormal   Collection Time: 03/10/23  2:04 PM  Result Value Ref Range   Hemoglobin A1C 6.7 (A) 4.0 - 5.6 %   HbA1c POC (<> result, manual  entry)     HbA1c, POC (prediabetic range)     HbA1c, POC (controlled diabetic range)       Psychiatric Specialty Exam: Physical Exam  Review of Systems  Weight 205 lb (93 kg).There is no height or weight on file to calculate BMI.  General Appearance: Well Groomed  Eye Contact:  Good  Speech:  Clear and Coherent and Normal Rate  Volume:  Normal  Mood:  Euthymic  Affect:  Appropriate  Thought Process:  Goal Directed  Orientation:  Full (Time, Place, and Person)  Thought Content:  Logical  Suicidal Thoughts:  No  Homicidal Thoughts:  No  Memory:  Immediate;   Good Recent;   Good Remote;   Good  Judgement:  Good  Insight:  Good  Psychomotor Activity:  Normal  Concentration:  Concentration: Good and Attention Span: Good  Recall:  Good  Fund of Knowledge:  Good  Language:  Good  Akathisia:  No  Handed:  Right  AIMS (if indicated):     Assets:  Communication Skills Desire for Improvement Housing Resilience Social Support Transportation  ADL's:  Intact  Cognition:  WNL  Sleep:  ok       02/12/2023    4:11 PM 08/31/2021    1:46 PM 07/21/2020    3:59 PM 12/06/2019   10:56 AM 06/11/2019    4:01 PM  Depression screen PHQ 2/9  Decreased Interest 0 3 0 1 1  Down, Depressed, Hopeless 0 3 0 0 0  PHQ - 2 Score 0 6 0 1 1  Altered sleeping 0 3  0 1  Tired, decreased energy 0 2  0 0  Change in appetite 0 1  0 0  Feeling bad or failure about yourself  0 0  0 0  Trouble concentrating 0 2  0 0  Moving slowly or fidgety/restless 0 0  0 0  Suicidal thoughts 0 0  0 0  PHQ-9 Score 0 14  1 2   Difficult doing work/chores Not difficult at all Very difficult  Not difficult at all Not difficult at all    Assessment/Plan: Chronic post-traumatic stress disorder (PTSD) - Plan: lamoTRIgine  (LAMICTAL ) 200 MG tablet, QUEtiapine  (SEROQUEL ) 200 MG tablet  MDD (major depressive disorder), recurrent episode, moderate (HCC) - Plan: lamoTRIgine  (LAMICTAL ) 200 MG tablet, QUEtiapine  (SEROQUEL ) 200 MG  tablet  Marital problem - Plan: lamoTRIgine  (LAMICTAL ) 200 MG tablet  Discussed marital issues but patient is in marriage counseling.  His son is also seeing a therapist and he like his son to be seen by psychiatrist in our office.  So far no major concerns or issues with the medication.  He like to increase Lamictal  to help his mood and his stress to be get relieved due to medical problems.  I recommend to try Lamictal  200 mg as sometimes he breaks the medication 100 mg to take 1-1/2 and not sure if he is getting enough medication.  Continues Seroquel  200 mg at bedtime.  Patient like to have a visit in person.  Will schedule 3 months in person visit.  I encouraged to call us  back with any question or any concern.  I reviewed blood work results.  Follow-up in 3 months   Follow Up Instructions:     I discussed the assessment and treatment plan with the patient. The patient was provided an opportunity to ask questions and all were answered. The patient agreed with the plan and demonstrated an understanding of the instructions.   The patient was advised to call back or seek an in-person evaluation if the symptoms worsen or if the condition fails to improve as anticipated.    Collaboration of Care: Other provider involved in patient's care AEB notes are available in epic to review  Patient/Guardian was advised Release of Information must be obtained prior to any record release in order to collaborate their care with an outside provider. Patient/Guardian was advised if they have not already done so to contact the registration department to sign all necessary forms in order for us  to release information regarding their care.   Consent: Patient/Guardian gives verbal consent for treatment and assignment of benefits for services provided during this visit. Patient/Guardian expressed understanding and agreed to proceed.  Total encounter time 29 minutes which includes face-to-face time, chart  reviewed, care coordination, order entry and documentation during this encounter.   Note: This document was prepared by [employer] voice dictation technology and any errors that results from this process are unintentional.    Arturo Late, MD 06/06/2023

## 2023-06-10 ENCOUNTER — Encounter: Payer: Self-pay | Admitting: Family Medicine

## 2023-06-10 ENCOUNTER — Other Ambulatory Visit (HOSPITAL_BASED_OUTPATIENT_CLINIC_OR_DEPARTMENT_OTHER): Payer: Self-pay

## 2023-06-10 ENCOUNTER — Ambulatory Visit (INDEPENDENT_AMBULATORY_CARE_PROVIDER_SITE_OTHER): Admitting: Family Medicine

## 2023-06-10 VITALS — BP 116/76 | HR 95 | Temp 98.0°F | Resp 16 | Ht 73.0 in | Wt 210.0 lb

## 2023-06-10 DIAGNOSIS — Z0001 Encounter for general adult medical examination with abnormal findings: Secondary | ICD-10-CM

## 2023-06-10 DIAGNOSIS — Z23 Encounter for immunization: Secondary | ICD-10-CM | POA: Diagnosis not present

## 2023-06-10 DIAGNOSIS — I10 Essential (primary) hypertension: Secondary | ICD-10-CM | POA: Diagnosis not present

## 2023-06-10 DIAGNOSIS — Z1211 Encounter for screening for malignant neoplasm of colon: Secondary | ICD-10-CM

## 2023-06-10 DIAGNOSIS — K644 Residual hemorrhoidal skin tags: Secondary | ICD-10-CM

## 2023-06-10 DIAGNOSIS — L989 Disorder of the skin and subcutaneous tissue, unspecified: Secondary | ICD-10-CM

## 2023-06-10 DIAGNOSIS — R040 Epistaxis: Secondary | ICD-10-CM

## 2023-06-10 DIAGNOSIS — Z0000 Encounter for general adult medical examination without abnormal findings: Secondary | ICD-10-CM

## 2023-06-10 DIAGNOSIS — Z125 Encounter for screening for malignant neoplasm of prostate: Secondary | ICD-10-CM | POA: Diagnosis not present

## 2023-06-10 DIAGNOSIS — E291 Testicular hypofunction: Secondary | ICD-10-CM | POA: Diagnosis not present

## 2023-06-10 LAB — LIPID PANEL
Cholesterol: 78 mg/dL (ref 0–200)
HDL: 20.9 mg/dL — ABNORMAL LOW (ref >39.00)
LDL Cholesterol: 23 mg/dL (ref 0–99)
NonHDL: 57.35
Total CHOL/HDL Ratio: 4
Triglycerides: 173 mg/dL — ABNORMAL HIGH (ref 0.0–149.0)
VLDL: 34.6 mg/dL (ref 0.0–40.0)

## 2023-06-10 LAB — CBC
HCT: 37.1 % — ABNORMAL LOW (ref 39.0–52.0)
Hemoglobin: 12.7 g/dL — ABNORMAL LOW (ref 13.0–17.0)
MCHC: 34.1 g/dL (ref 30.0–36.0)
MCV: 89.6 fl (ref 78.0–100.0)
Platelets: 138 10*3/uL — ABNORMAL LOW (ref 150.0–400.0)
RBC: 4.14 Mil/uL — ABNORMAL LOW (ref 4.22–5.81)
RDW: 14 % (ref 11.5–15.5)
WBC: 5.1 10*3/uL (ref 4.0–10.5)

## 2023-06-10 LAB — PSA: PSA: 0.49 ng/mL (ref 0.10–4.00)

## 2023-06-10 LAB — COMPREHENSIVE METABOLIC PANEL WITH GFR
ALT: 28 U/L (ref 0–53)
AST: 23 U/L (ref 0–37)
Albumin: 4.7 g/dL (ref 3.5–5.2)
Alkaline Phosphatase: 48 U/L (ref 39–117)
BUN: 20 mg/dL (ref 6–23)
CO2: 26 meq/L (ref 19–32)
Calcium: 9.4 mg/dL (ref 8.4–10.5)
Chloride: 106 meq/L (ref 96–112)
Creatinine, Ser: 1.1 mg/dL (ref 0.40–1.50)
GFR: 70.7 mL/min (ref >60.00)
Glucose, Bld: 144 mg/dL — ABNORMAL HIGH (ref 70–99)
Potassium: 4.5 meq/L (ref 3.5–5.1)
Sodium: 141 meq/L (ref 135–145)
Total Bilirubin: 0.4 mg/dL (ref 0.2–1.2)
Total Protein: 6.7 g/dL (ref 6.0–8.3)

## 2023-06-10 LAB — TESTOSTERONE: Testosterone: 120.43 ng/dL — ABNORMAL LOW (ref 300.00–890.00)

## 2023-06-10 MED ORDER — HYDROCORTISONE (PERIANAL) 2.5 % EX CREA
1.0000 | TOPICAL_CREAM | Freq: Two times a day (BID) | CUTANEOUS | 0 refills | Status: DC
  Filled 2023-06-10: qty 30, 10d supply, fill #0

## 2023-06-10 NOTE — Progress Notes (Signed)
 Chief Complaint  Patient presents with   Annual Exam    CPE    Well Male William [name] is here for a complete physical.   His last physical was >1 year ago.  Current diet: in general, a "healthy" diet.   Current exercise: walking Weight trend: increased Fatigue out of ordinary? No. Seat belt? Yes.   Advanced directive? Yes  Health maintenance Shingrix - Yes Colonoscopy- Due Tetanus- Yes Hep C- Yes Pneumonia vaccine- Due  Skin lesion On back of head, he has 3 lesions he thinks are cysts.  These have been there for the past 6 months.  No pain, itching, drainage, spreading. They do bother him. Started after his head being propped up in the ICU. Has not tried anything at home.   Hemorrhoids BRBPR intermittently usually after BM's.  This has been going on for the past several months.  No pain or itching. Usually does not strain. Last CCS was 2017 by Dr. Bridgett Camps. He is due. No trauma.   Nosebleeds Every other day has been bleeding from both nostrils. No trauma. Has not tried anything. Does not use a humidifier.   Past Medical History:  Diagnosis Date   Adjustment disorder with mixed anxiety and depressed mood    Arthritis    Asthma    history   Diabetes mellitus    type 2   Elevated blood pressure    history of high blood pressure readings   History of chicken pox    Hypercholesteremia    Hypertension    Internal hemorrhoids    Tubular adenoma of colon      Past Surgical History:  Procedure Laterality Date   ELBOW SURGERY     right elbow 2010, left elbow 08/06/10 [facility])   KNEE SURGERY     right knee new ACL- [phone]   KNEE SURGERY     left knee 2003   NASAL SEPTUM SURGERY     1992   PARTIAL HIP ARTHROPLASTY     right hip replacement    Medications  Current Outpatient Medications on File Prior to Visit  Medication Sig Dispense Refill   apixaban  (ELIQUIS ) 5 MG TABS tablet Take 1 tablet (5 mg total) by mouth 2 (two) times daily. 60 tablet 0   apixaban   (ELIQUIS ) 5 MG TABS tablet Take 1 tablet (5 mg total) by mouth 2 (two) times daily. 180 tablet 0   carvedilol  (COREG ) 12.5 MG tablet Take 1 tablet (12.5 mg total) by mouth in the morning and 1 tablet (12.5 mg total) in the evening. Take with meals. 180 tablet 1   chlorhexidine  (PERIOGARD ) 0.12 % solution RINSE MOUTH WITH (1 CAPFUL) FOR 30 SECONDS AM AND PM AFTER TOOTHBRUSHING. SPIT AFTER RINSING, DO NOT SWALLOW 473 mL 3   Continuous Glucose Sensor (FREESTYLE LIBRE 3 PLUS SENSOR) MISC Use 1 sensor every 15 days as directed. 6 each 3   diclofenac  (VOLTAREN ) 75 MG EC tablet Take 1 tablet (75 mg total) by mouth 2 (two) times daily. 50 tablet 2   Empagliflozin -metFORMIN  HCl (SYNJARDY ) 12.06-998 MG TABS Take 1 tablet by mouth 2 (two) times daily. 180 tablet 3   famotidine  (PEPCID ) 20 MG tablet Take 1 tablet (20 mg total) by mouth 2 (two) times daily. 180 tablet 1   fenofibrate  micronized (LOFIBRA) 200 MG capsule Take 1 capsule (200 mg total) by mouth daily before breakfast. 90 capsule 3   fluticasone  (FLONASE ) 50 MCG/ACT nasal spray Place 2 sprays into both nostrils daily.  16 g 1   Insulin  Glargine (BASAGLAR  KWIKPEN) 100 UNIT/ML Inject 35 Units into the skin daily. 45 mL 3   Insulin  Pen Needle 29G X 12.7MM MISC Use as directed for insulin  injection. 100 each 3   ipratropium (ATROVENT ) 0.02 % nebulizer solution Take 2.5 mLs (0.5 mg total) by nebulization 4 (four) times daily. 75 mL 12   ipratropium-albuterol  (DUONEB) 0.5-2.5 (3) MG/3ML SOLN Take 3 mL by nebulization every 6 (six) hours as needed for wheezing. 180 mL 0   lamoTRIgine  (LAMICTAL ) 200 MG tablet Take 1 tablet (200 mg total) by mouth daily. 90 tablet 0   lisinopril  (ZESTRIL ) 10 MG tablet Take 1 tablet (10 mg total) by mouth daily. 90 tablet 1   Melatonin 10 MG TABS Take 10 mg by mouth at bedtime.     QUEtiapine  (SEROQUEL ) 200 MG tablet Take 1 tablet (200 mg total) by mouth at bedtime. 90 tablet 0   Respiratory Therapy Supplies (TUBING/WING  TIP) MISC Once daily 1 each 8   rosuvastatin  (CRESTOR ) 40 MG tablet Take 1 tablet (40 mg total) by mouth daily. 90 tablet 3   sildenafil  (VIAGRA ) 100 MG tablet Take 0.5-1 tablets (50-100 mg total) by mouth daily as needed for erectile dysfunction. *NEED APPT FOR FURTHER REFILLS* 90 tablet 2   Sodium Fluoride  (CLINPRO  5000) 1.1 % PSTE Use pea size amount of toothpaste and brush for 2 mins and spit. Do not eat/drink/rinse for 30 mins 100 mL 3   SYRINGE-NEEDLE, DISP, 3 ML (B-D 3CC LUER-LOK SYR 22GX1") 22G X 1" 3 ML MISC Use to inject testosterone  10 each 3   tamsulosin  (FLOMAX ) 0.4 MG CAPS capsule Take 2 capsules (0.8 mg total) by mouth daily. 180 capsule 1   testosterone  enanthate (DELATESTRYL ) 200 MG/ML injection Inject 0.25 mLs (50 mg total) into the muscle once a week. 5 mL 5   tirzepatide  (MOUNJARO ) 5 MG/0.5ML Pen Inject 5 mg into the skin once a week. 6 mL 3    Allergies No Known Allergies  Family History Family History  Problem Relation Age of Onset   Diabetes Mother    Thyroid  disease Mother        Questionable   Heart disease Father        deceased   Other Neg Hx        hypogonadism   Colon cancer Neg Hx     Review of Systems: Constitutional:  no fevers Eye:  no recent significant change in vision Ears:  No changes in hearing Nose/Mouth/Throat:  +nosebleeds Cardiovascular: no chest pain Respiratory:  No shortness of breath Gastrointestinal:  No change in bowel habits, +BRBPR intermittently  GU:  No frequency Integumentary: +cysts on the back of his head without drainage; otherwise no abnormal skin lesions reported Neurologic:  no headaches Endocrine:  denies unexplained weight changes  Exam BP 116/76 (BP Location: Left Arm, Patient Position: Sitting)   Pulse 95   Temp 98 F (36.7 C) (Oral)   Resp 16   Ht 6\' 1"  (1.854 m)   Wt 210 lb (95.3 kg)   SpO2 95%   BMI 27.71 kg/m  General:  well developed, well nourished, in no apparent distress Skin:  no significant  moles, warts, or growths Head:  no masses, lesions, or tenderness Eyes:  pupils equal and round, sclera anicteric without injection Ears:  canals without lesions, TMs shiny without retraction, no obvious effusion, no erythema Nose:  nares patent, mucosa normal on the left, excoriation noted over septal mucosa on  the right Throat/Pharynx:  lips and gingiva without lesion; tongue and uvula midline; non-inflamed pharynx; no exudates or postnasal drainage Lungs:  clear to auscultation, breath sounds equal bilaterally, no respiratory distress Cardio:  regular rate and rhythm, no LE edema or bruits Rectal: No active bleeding or fissures.  2 external tags noted. GI: BS+, S, NT, ND, no masses or organomegaly Musculoskeletal:  symmetrical muscle groups noted without atrophy or deformity Neuro:  gait normal; deep tendon reflexes normal and symmetric Psych: well oriented with normal range of affect and appropriate judgment/insight  Assessment and Plan  Well adult exam  Screening for prostate cancer - Plan: PSA  Essential hypertension - Plan: CBC, Comprehensive metabolic panel with GFR, Lipid panel  Screen for colon cancer - Plan: Ambulatory referral to Gastroenterology  Skin lesion of scalp - Plan: Ambulatory referral to Dermatology  External hemorrhoids - Plan: hydrocortisone  (ANUSOL -HC) 2.5 % rectal cream  Hypogonadism in male - Plan: Testosterone   Epistaxis   Well 63 y.o. male. Counseled on diet and exercise. Advanced directive form requested today.  PCV20 today.  CCS- referral placed Hemorrhoids: Chronic, not controlled.  Seems internal given lack of other findings. There are some ext tags as well. Anusol  prn. Hydration rec'd.  Epistaxis: Avoid trauma, use humidifier, Vaseline, and TAO.  Skin lesion: Could be repetitive trauma. Will refer to derm for their opinion.  Other orders as above. Follow up in 6 mo.  The patient voiced understanding and agreement to the plan.  Shellie Dials Knox, DO 06/10/23 10:14 AM

## 2023-06-10 NOTE — Patient Instructions (Addendum)
 Give us  2-3 business days to get the results of your labs back.   Keep the diet clean and stay active.  Please get me a copy of your advanced directive form at your convenience.   If you do not hear anything about your referral in the next 1-2 weeks, call our office and ask for an update.  Stay hydrated. Make sure BM's are smooth and try not to strain.   Avoid digital trauma (picking your nose).  Use an air humidifier at night at least.   Use triple antibiotic ointment like Neosporin twice daily. Use Vaseline/Ayr Gel/NasoGel in between.  If you have a nosebleed, apply direct pressure for 10 minutes. If a bleed lasts longer than 15-20 minutes, go to the ER.  Consider use of Afrin spray or ice during a nosebleed to help.  Let us  know if you need anything.

## 2023-06-10 NOTE — Addendum Note (Signed)
 Addended by: Shahil Speegle M on: 06/10/2023 10:29 AM   Modules accepted: Orders

## 2023-06-11 ENCOUNTER — Encounter: Payer: Self-pay | Admitting: Family Medicine

## 2023-06-12 ENCOUNTER — Other Ambulatory Visit: Payer: Self-pay | Admitting: Internal Medicine

## 2023-06-12 ENCOUNTER — Other Ambulatory Visit (HOSPITAL_BASED_OUTPATIENT_CLINIC_OR_DEPARTMENT_OTHER): Payer: Self-pay

## 2023-06-12 DIAGNOSIS — E1165 Type 2 diabetes mellitus with hyperglycemia: Secondary | ICD-10-CM

## 2023-06-13 ENCOUNTER — Other Ambulatory Visit: Payer: Self-pay

## 2023-06-13 ENCOUNTER — Other Ambulatory Visit (HOSPITAL_BASED_OUTPATIENT_CLINIC_OR_DEPARTMENT_OTHER): Payer: Self-pay

## 2023-06-13 DIAGNOSIS — D649 Anemia, unspecified: Secondary | ICD-10-CM

## 2023-06-16 ENCOUNTER — Other Ambulatory Visit (HOSPITAL_BASED_OUTPATIENT_CLINIC_OR_DEPARTMENT_OTHER): Payer: Self-pay

## 2023-06-16 ENCOUNTER — Other Ambulatory Visit

## 2023-06-16 ENCOUNTER — Other Ambulatory Visit (INDEPENDENT_AMBULATORY_CARE_PROVIDER_SITE_OTHER)

## 2023-06-16 DIAGNOSIS — L905 Scar conditions and fibrosis of skin: Secondary | ICD-10-CM | POA: Diagnosis not present

## 2023-06-16 DIAGNOSIS — L281 Prurigo nodularis: Secondary | ICD-10-CM | POA: Diagnosis not present

## 2023-06-16 DIAGNOSIS — D649 Anemia, unspecified: Secondary | ICD-10-CM | POA: Diagnosis not present

## 2023-06-16 LAB — CBC WITH DIFFERENTIAL/PLATELET
Basophils Absolute: 0 10*3/uL (ref 0.0–0.1)
Basophils Relative: 0.7 % (ref 0.0–3.0)
Eosinophils Absolute: 0.1 10*3/uL (ref 0.0–0.7)
Eosinophils Relative: 3.4 % (ref 0.0–5.0)
HCT: 35.7 % — ABNORMAL LOW (ref 39.0–52.0)
Hemoglobin: 11.9 g/dL — ABNORMAL LOW (ref 13.0–17.0)
Lymphocytes Relative: 50.2 % — ABNORMAL HIGH (ref 12.0–46.0)
Lymphs Abs: 2 10*3/uL (ref 0.7–4.0)
MCHC: 33.2 g/dL (ref 30.0–36.0)
MCV: 89.9 fl (ref 78.0–100.0)
Monocytes Absolute: 0.3 10*3/uL (ref 0.1–1.0)
Monocytes Relative: 7.9 % (ref 3.0–12.0)
Neutro Abs: 1.5 10*3/uL (ref 1.4–7.7)
Neutrophils Relative %: 37.8 % — ABNORMAL LOW (ref 43.0–77.0)
Platelets: 141 10*3/uL — ABNORMAL LOW (ref 150.0–400.0)
RBC: 3.98 Mil/uL — ABNORMAL LOW (ref 4.22–5.81)
RDW: 14 % (ref 11.5–15.5)
WBC: 4 10*3/uL (ref 4.0–10.5)

## 2023-06-17 ENCOUNTER — Other Ambulatory Visit (HOSPITAL_BASED_OUTPATIENT_CLINIC_OR_DEPARTMENT_OTHER): Payer: Self-pay

## 2023-06-17 ENCOUNTER — Encounter: Payer: Self-pay | Admitting: Internal Medicine

## 2023-06-17 ENCOUNTER — Ambulatory Visit: Payer: Self-pay | Admitting: Family Medicine

## 2023-06-17 LAB — IRON,TIBC AND FERRITIN PANEL
%SAT: 16 % — ABNORMAL LOW (ref 20–48)
Ferritin: 22 ng/mL — ABNORMAL LOW (ref 24–380)
Iron: 56 ug/dL (ref 50–180)
TIBC: 345 ug/dL (ref 250–425)

## 2023-06-18 ENCOUNTER — Ambulatory Visit (INDEPENDENT_AMBULATORY_CARE_PROVIDER_SITE_OTHER): Admitting: "Endocrinology

## 2023-06-18 ENCOUNTER — Other Ambulatory Visit (HOSPITAL_BASED_OUTPATIENT_CLINIC_OR_DEPARTMENT_OTHER): Payer: Self-pay

## 2023-06-18 VITALS — BP 110/70 | HR 79 | Ht 73.0 in | Wt 207.0 lb

## 2023-06-18 DIAGNOSIS — Z7984 Long term (current) use of oral hypoglycemic drugs: Secondary | ICD-10-CM | POA: Diagnosis not present

## 2023-06-18 DIAGNOSIS — E119 Type 2 diabetes mellitus without complications: Secondary | ICD-10-CM | POA: Diagnosis not present

## 2023-06-18 DIAGNOSIS — E782 Mixed hyperlipidemia: Secondary | ICD-10-CM

## 2023-06-18 DIAGNOSIS — Z794 Long term (current) use of insulin: Secondary | ICD-10-CM

## 2023-06-18 DIAGNOSIS — E291 Testicular hypofunction: Secondary | ICD-10-CM | POA: Diagnosis not present

## 2023-06-18 DIAGNOSIS — Z7985 Long-term (current) use of injectable non-insulin antidiabetic drugs: Secondary | ICD-10-CM

## 2023-06-18 LAB — POCT GLYCOSYLATED HEMOGLOBIN (HGB A1C): Hemoglobin A1C: 7 % — AB (ref 4.0–5.6)

## 2023-06-18 MED ORDER — BASAGLAR KWIKPEN 100 UNIT/ML ~~LOC~~ SOPN
40.0000 [IU] | PEN_INJECTOR | Freq: Every day | SUBCUTANEOUS | 3 refills | Status: AC
  Filled 2023-06-18: qty 39, 86d supply, fill #0
  Filled 2023-06-18: qty 45, 100d supply, fill #0
  Filled 2023-08-15: qty 21, 47d supply, fill #1
  Filled 2023-11-24 – 2024-01-27 (×2): qty 39, 86d supply, fill #1

## 2023-06-18 MED ORDER — GVOKE HYPOPEN 1-PACK 1 MG/0.2ML ~~LOC~~ SOAJ
1.0000 mg | SUBCUTANEOUS | 2 refills | Status: DC | PRN
  Filled 2023-06-18: qty 0.4, 1d supply, fill #0

## 2023-06-18 NOTE — Progress Notes (Signed)
 Outpatient Endocrinology Note William Newcomer, MD  06/18/23   William [name] May 16, 1960 [id number]  Referring Provider: Jobe Mulder* Primary Care Provider: Jobe Mulder, DO Reason for consultation: Subjective   Assessment & Plan  Diagnoses and all orders for this visit:  Hypogonadism in male  Type 2 diabetes mellitus without complication, without long-term current use of insulin  (HCC) -     POCT glycosylated hemoglobin (Hb A1C) -     Insulin  Glargine (BASAGLAR  KWIKPEN) 100 UNIT/ML; Inject 40-45 Units into the skin daily.  Long term (current) use of oral hypoglycemic drugs  Long-term (current) use of injectable non-insulin  antidiabetic drugs  Long-term insulin  use (HCC)  Mixed hypercholesterolemia and hypertriglyceridemia  Other orders -     Glucagon (GVOKE HYPOPEN 1-PACK) 1 MG/0.2ML SOAJ; Inject 1 mg into the skin as needed (low blood sugar with impaired consciousness).   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 ,  Lab Results  Component Value Date   GFR 70.70 06/10/2023   Hba1c goal less than 7, current Hba1c is  Lab Results  Component Value Date   HGBA1C 7.0 (A) 06/18/2023   Will recommend the following: Synjardy  12.06-998 mg TWO Tabs QAM  Mounjaro  5 mg weekly Basaglar  40-45 units daily   No known contraindications/side effects to any of above medications Glucagon discussed and prescribed with refills on 06/18/23  -Last LD and Tg are as follows: Lab Results  Component Value Date   LDLCALC 23 06/10/2023    Lab Results  Component Value Date   TRIG 173.0 (H) 06/10/2023   -On rosuvastatin  40 mg every day and fenofibrate  micronized (LOFIBRA) 200 MG capsule every day  -Follow low  fat diet and exercise   -Blood pressure goal <140/90 - Microalbumin/creatinine goal is < 30 -Last MA/Cr is as follows: Lab Results  Component Value Date   MICROALBUR 0.3 11/01/2022   -on ACE/ARB lisinopril  10 mg every day  -diet changes including salt restriction -limit eating outside -counseled BP targets per standards of diabetes care -uncontrolled blood pressure can lead to retinopathy, nephropathy and cardiovascular and atherosclerotic heart disease  Reviewed and counseled on: -A1C target -Blood sugar targets -Complications of uncontrolled diabetes  -Checking blood sugar before meals and bedtime and bring log next visit -All medications with mechanism of action and side effects -Hypoglycemia management: rule of 15's, Glucagon Emergency Kit and medical alert ID -low-carb low-fat plate-method diet -At least 20 minutes of physical activity per day -Annual dilated retinal eye exam and foot exam -compliance and follow up needs -follow up as scheduled or earlier if problem gets worse  Call if blood sugar is less than 70 or consistently above 250    Take a 15 gm snack of carbohydrate at bedtime before you go to sleep if your blood sugar is less than 100.    If you are going to fast after midnight for a test or procedure, ask your physician for instructions on how to reduce/decrease your insulin  dose.    Call if blood sugar is less than 70 or consistently above 250  -Treating a low sugar by rule of 15  (15 gms of sugar every 15 min until sugar is more than 70) If you feel your sugar is low, test your sugar to  be sure If your sugar is low (less than 70), then take 15 grams of a fast acting Carbohydrate (3-4 glucose tablets or glucose gel or 4 ounces of juice or regular soda) Recheck your sugar 15 min after treating low to make sure it is more than 70 If sugar is still less than 70, treat again with 15 grams of carbohydrate          Don't drive the hour of hypoglycemia  If  unconscious/unable to eat or drink by mouth, use glucagon injection or nasal spray baqsimi and call 911. Can repeat again in 15 min if still unconscious.  Return for labs before next visit.   I have reviewed current medications, nurse's notes, allergies, vital signs, past medical and surgical history, family medical history, and social history for this encounter. Counseled patient on symptoms, examination findings, lab findings, imaging results, treatment decisions and monitoring and prognosis. The patient understood the recommendations and agrees with the treatment plan. All questions regarding treatment plan were fully answered.  William Newcomer, MD  06/18/23    History of Present Illness William [name] is a 63 y.o. year old male who presents for evaluation of Type II diabetes mellitus and hypogonadism.   TESTOSTERONE  HISTORY: He has been noted with low testosterone  since 2013, with a nadir of 123 NG/DL in 1610.  He was evaluated by urology 01/2022 due to elevated PSA in the setting of testosterone  therapy, no intervention was offered at the time, no prostate nodules were detected.  DIABETES HISTORY: Diagnosed with DM 2000 Prior Medications tried/Intolerance:  Hemoglobin A1c has ranged from 6.5% in 2020, peaking at 9.9% in 2022.  On his initial visit to our clinic and an A1c of 7.7%, he was on Synjardy , Ozempic , and Basaglar   We switch Ozempic  to Mounjaro  10/2022  Home diabetes regimen: Synjardy  12.06-998 mg TWO Tabs QAM  Mounjaro  5 mg weekly Has been doing Basaglar  35 units units daily ->70 units (when off of mounjaro )->50 units recently->0 units today  DIABETIC COMPLICATIONS: Microvascular complications:  Nonproliferative DR Denies: CKD, neuropathy  Last eye exam: Completed 02/2020  Macrovascular complications:   Denies: CAD, PVD, CVA  BLOOD SUGAR DATA  CGM interpretation: At today's visit, we reviewed her CGM downloads. The full report is scanned in the media. Reviewing  the CGM trends, BG are low across the day.    Physical Exam  BP 110/70   Pulse 79   Ht 6\' 1"  (1.854 m)   Wt 207 lb (93.9 kg)   SpO2 98%   BMI 27.31 kg/m    Constitutional: well developed, well nourished Head: normocephalic, atraumatic Eyes: sclera anicteric, no redness Neck: supple Lungs: normal respiratory effort Neurology: alert and oriented Skin: dry, no appreciable rashes Musculoskeletal: no appreciable defects Psychiatric: normal mood and affect Diabetic Foot Exam - Simple   No data filed      Current Medications Patient's Medications  New Prescriptions   GLUCAGON (GVOKE HYPOPEN 1-PACK) 1 MG/0.2ML SOAJ    Inject 1 mg into the skin as needed (low blood sugar with impaired consciousness).  Previous Medications   APIXABAN  (ELIQUIS ) 5 MG TABS TABLET    Take 1 tablet (5 mg total) by mouth 2 (two) times daily.   CARVEDILOL  (COREG ) 12.5 MG TABLET    Take 1 tablet (12.5 mg total) by mouth in the morning and 1 tablet (12.5 mg total) in the evening. Take with meals.   CHLORHEXIDINE  (PERIOGARD ) 0.12 % SOLUTION    RINSE MOUTH WITH (1  CAPFUL) FOR 30 SECONDS AM AND PM AFTER TOOTHBRUSHING. SPIT AFTER RINSING, DO NOT SWALLOW   CONTINUOUS GLUCOSE SENSOR (FREESTYLE LIBRE 3 PLUS SENSOR) MISC    Use 1 sensor every 15 days as directed.   DICLOFENAC  (VOLTAREN ) 75 MG EC TABLET    Take 1 tablet (75 mg total) by mouth 2 (two) times daily.   EMPAGLIFLOZIN -METFORMIN  HCL (SYNJARDY ) 12.06-998 MG TABS    Take 1 tablet by mouth 2 (two) times daily.   FAMOTIDINE  (PEPCID ) 20 MG TABLET    Take 1 tablet (20 mg total) by mouth 2 (two) times daily.   FENOFIBRATE  MICRONIZED (LOFIBRA) 200 MG CAPSULE    Take 1 capsule (200 mg total) by mouth daily before breakfast.   FLUTICASONE  (FLONASE ) 50 MCG/ACT NASAL SPRAY    Place 2 sprays into both nostrils daily.   HYDROCORTISONE  (ANUSOL -HC) 2.5 % RECTAL CREAM    Place 1 Application rectally 2 (two) times daily.   INSULIN  PEN NEEDLE 29G X 12.7MM MISC    Use as  directed for insulin  injection.   IPRATROPIUM (ATROVENT ) 0.02 % NEBULIZER SOLUTION    Take 2.5 mLs (0.5 mg total) by nebulization 4 (four) times daily.   IPRATROPIUM-ALBUTEROL  (DUONEB) 0.5-2.5 (3) MG/3ML SOLN    Take 3 mL by nebulization every 6 (six) hours as needed for wheezing.   LAMOTRIGINE  (LAMICTAL ) 200 MG TABLET    Take 1 tablet (200 mg total) by mouth daily.   LISINOPRIL  (ZESTRIL ) 10 MG TABLET    Take 1 tablet (10 mg total) by mouth daily.   MELATONIN 10 MG TABS    Take 10 mg by mouth at bedtime.   QUETIAPINE  (SEROQUEL ) 200 MG TABLET    Take 1 tablet (200 mg total) by mouth at bedtime.   RESPIRATORY THERAPY SUPPLIES (TUBING/WING TIP) MISC    Once daily   ROSUVASTATIN  (CRESTOR ) 40 MG TABLET    Take 1 tablet (40 mg total) by mouth daily.   SILDENAFIL  (VIAGRA ) 100 MG TABLET    Take 0.5-1 tablets (50-100 mg total) by mouth daily as needed for erectile dysfunction. *NEED APPT FOR FURTHER REFILLS*   SODIUM FLUORIDE  (CLINPRO  5000) 1.1 % PSTE    Use pea size amount of toothpaste and brush for 2 mins and spit. Do not eat/drink/rinse for 30 mins   SYRINGE-NEEDLE, DISP, 3 ML (B-D 3CC LUER-LOK SYR 22GX1") 22G X 1" 3 ML MISC    Use to inject testosterone    TAMSULOSIN  (FLOMAX ) 0.4 MG CAPS CAPSULE    Take 2 capsules (0.8 mg total) by mouth daily.   TESTOSTERONE  ENANTHATE (DELATESTRYL ) 200 MG/ML INJECTION    Inject 0.25 mLs (50 mg total) into the muscle once a week.   TIRZEPATIDE  (MOUNJARO ) 5 MG/0.5ML PEN    Inject 5 mg into the skin once a week.  Modified Medications   Modified Medication Previous Medication   INSULIN  GLARGINE (BASAGLAR  KWIKPEN) 100 UNIT/ML Insulin  Glargine (BASAGLAR  KWIKPEN) 100 UNIT/ML      Inject 40-45 Units into the skin daily.    Inject 35 Units into the skin daily.  Discontinued Medications   No medications on file    Allergies No Known Allergies  Past Medical History Past Medical History:  Diagnosis Date   Adjustment disorder with mixed anxiety and depressed mood     Arthritis    Asthma    history   Diabetes mellitus    type 2   Elevated blood pressure    history of high blood pressure readings   History of  chicken pox    Hypercholesteremia    Hypertension    Internal hemorrhoids    Tubular adenoma of colon     Past Surgical History Past Surgical History:  Procedure Laterality Date   ELBOW SURGERY     right elbow 2010, left elbow 08/06/10 [place] [facility])   KNEE SURGERY     right knee new ACL- [phone]   KNEE SURGERY     left knee 2003   NASAL SEPTUM SURGERY     1992   PARTIAL HIP ARTHROPLASTY     right hip replacement    Family History family history includes Diabetes in his mother; Heart disease in his father; Thyroid  disease in his mother.  Social History Social History   Socioeconomic History   Marital status: Married    Spouse name: Not on file   Number of children: Not on file   Years of education: Not on file   Highest education level: Not on file  Occupational History   Not on file  Tobacco Use   Smoking status: Never   Smokeless tobacco: Never  Substance and Sexual Activity   Alcohol use: Yes    Alcohol/week: 7.0 - 14.0 standard drinks of alcohol    Types: 7 - 14 Glasses of wine per week   Drug use: No   Sexual activity: Not on file  Other Topics Concern   Not on file  Social History Narrative   Occupation: Real Therapist, occupational   Married -15 marriage (2nd marriage)   daughter 51,  2 sons (2nd marriage  11,6)   [place]   Never Smoked    Alcohol use-yes   Drug use-no    Regular exercise-no   Smoking Status:  never   Does Patient Exercise:  no   Caffeine use/day:  3-4 cups coffee daily   Drug Use:  no         Social Drivers of Corporate investment banker Strain: Not on file  Food Insecurity: Low Risk  (11/27/2022)   Received from [facility]   Hunger Vital Sign    Worried About Running Out of Food in the Last Year: Never true    Ran Out of Food in the Last Year: Never true  Transportation Needs: No  Transportation Needs (11/27/2022)   Received from [facility]    In the past 12 months, has lack of reliable transportation kept you from medical appointments, meetings, work or from getting things needed for daily living? : No  Physical Activity: Not on file  Stress: Not on file  Social Connections: Not on file  Intimate Partner Violence: Not on file    Lab Results  Component Value Date   HGBA1C 7.0 (A) 06/18/2023   HGBA1C 6.7 (A) 03/10/2023   HGBA1C 7.2 (A) 11/01/2022   Lab Results  Component Value Date   CHOL 78 06/10/2023   Lab Results  Component Value Date   HDL 20.90 (L) 06/10/2023   Lab Results  Component Value Date   LDLCALC 23 06/10/2023   Lab Results  Component Value Date   TRIG 173.0 (H) 06/10/2023   Lab Results  Component Value Date   CHOLHDL 4 06/10/2023   Lab Results  Component Value Date   CREATININE 1.10 06/10/2023   Lab Results  Component Value Date   GFR 70.70 06/10/2023   Lab Results  Component Value Date   MICROALBUR 0.3 11/01/2022      Component Value Date/Time   NA 141  06/10/2023 1102   K 4.5 06/10/2023 1102   CL 106 06/10/2023 1102   CO2 26 06/10/2023 1102   GLUCOSE 144 (H) 06/10/2023 1102   BUN 20 06/10/2023 1102   CREATININE 1.10 06/10/2023 1102   CREATININE 0.92 11/01/2022 1440   CALCIUM  9.4 06/10/2023 1102   PROT 6.7 06/10/2023 1102   ALBUMIN 4.7 06/10/2023 1102   AST 23 06/10/2023 1102   ALT 28 06/10/2023 1102   ALKPHOS 48 06/10/2023 1102   BILITOT 0.4 06/10/2023 1102      Latest Ref Rng & Units 06/10/2023   11:02 AM 11/01/2022    2:40 PM 10/30/2021    2:59 PM  BMP  Glucose 70 - 99 mg/dL 161  096  045   BUN 6 - 23 mg/dL 20  15  17    Creatinine 0.40 - 1.50 mg/dL 4.09  8.11  9.14   BUN/Creat Ratio 6 - 22 (calc)  SEE NOTE:    Sodium 135 - 145 mEq/L 141  136  138   Potassium 3.5 - 5.1 mEq/L 4.5  4.5  4.1   Chloride 96 - 112 mEq/L 106  102  102   CO2 19 - 32 mEq/L 26  24  25    Calcium  8.4 - 10.5  mg/dL 9.4  9.7  9.3        Component Value Date/Time   WBC 4.0 06/16/2023 0852   RBC 3.98 (L) 06/16/2023 0852   HGB 11.9 (L) 06/16/2023 0852   HCT 35.7 (L) 06/16/2023 0852   PLT 141.0 (L) 06/16/2023 0852   MCV 89.9 06/16/2023 0852   MCH 29.0 11/01/2022 1440   MCHC 33.2 06/16/2023 0852   RDW 14.0 06/16/2023 0852   LYMPHSABS 2.0 06/16/2023 0852   MONOABS 0.3 06/16/2023 0852   EOSABS 0.1 06/16/2023 0852   BASOSABS 0.0 06/16/2023 0852     Parts of this note may have been dictated using voice recognition software. There may be variances in spelling and vocabulary which are unintentional. Not all errors are proofread. Please notify the [facility] if any discrepancies are noted or if the meaning of any statement is not clear.

## 2023-06-18 NOTE — Patient Instructions (Addendum)
 Will recommend the following: Synjardy  12.06-998 mg TWO Tabs QAM  Mounjaro  5 mg weekly Basaglar  40-45 units daily   ___________   Take a 15 gm snack of carbohydrate at bedtime before you go to sleep if your blood sugar is less than 100.  If you are going to fast after midnight for a test or procedure, ask your physician for instructions on how to reduce/decrease your insulin  dose.  Call if blood sugar is less than 70 or consistently above 250  -Treating a low sugar by rule of 15  (15 gms of sugar every 15 min until sugar is more than 70) If you feel your sugar is low, test your sugar to be sure If your sugar is low (less than 70), then take 15 grams of a fast acting Carbohydrate (3-4 glucose tablets or glucose gel or 4 ounces of juice or regular soda) Recheck your sugar 15 min after treating low to make sure it is more than 70 If sugar is still less than 70, treat again with 15 grams of carbohydrate                Don't drive the hour of hypoglycemia  If unconscious/unable to eat or drink by mouth, use glucagon injection or nasal spray baqsimi and call 911. Can repeat again in 15 min if still unconscious.  Call your doctor if blood sugar is less than 70 or consistently above 250   ____________   Goals of DM therapy:  Morning Fasting blood sugar: 80-140  Blood sugar before meals: 80-140 Bed time blood sugar: 100-150  A1C <7%, limited only by hypoglycemia  1.Diabetes medications and their side effects discussed, including hypoglycemia    2. Check blood glucose:  a) Always check blood sugars before driving. Please see below (under hypoglycemia) on how to manage b) Check a minimum of 3 times/day or more as needed when having symptoms of hypoglycemia.   c) Try to check blood glucose before sleeping/in the middle of the night to ensure that it is remaining stable and not dropping less than 100 d) Check blood glucose more often if sick  3. Diet: a) 3 meals per day schedule b:  Restrict carbs to 60-70 grams (4 servings) per meal c) Colorful vegetables - 3 servings a day, and low sugar fruit 2 servings/day Plate control method: 1/4 plate protein, 1/4 starch, 1/2 green, yellow, or red vegetables d) Avoid carbohydrate snacks unless hypoglycemic episode, or increased physical activity  4. Regular exercise as tolerated, preferably 3 or more hours a week  5. Hypoglycemia: a)  Do not drive or operate machinery without first testing blood glucose to assure it is over 90 mg%, or if dizzy, lightheaded, not feeling normal, etc, or  if foot or leg is numb or weak. b)  If blood glucose less than 70, take four 5gm Glucose tabs or 15-30 gm Glucose gel.  Repeat every 15 min as needed until blood sugar is >100 mg/dl. If hypoglycemia persists then call 911.   6. Sick day management: a) Check blood glucose more often b) Continue usual therapy if blood sugars are elevated.   7. Contact the doctor immediately if blood glucose is frequently <60 mg/dl, or an episode of severe hypoglycemia occurs (where someone had to give you glucose/  glucagon or if you passed out from a low blood glucose), or if blood glucose is persistently >350 mg/dl, for further management  8. A change in level of physical activity or exercise and a  change in diet may also affect your blood sugar. Check blood sugars more often and call if needed.  Instructions: 1. Bring glucose meter, blood glucose records on every visit for review 2. Continue to follow up with primary care physician and other providers for medical care 3. Yearly eye  and foot exam 4. Please get blood work done prior to the next appointment

## 2023-06-20 ENCOUNTER — Encounter: Payer: Self-pay | Admitting: "Endocrinology

## 2023-06-25 ENCOUNTER — Ambulatory Visit: Admitting: Podiatry

## 2023-06-26 ENCOUNTER — Ambulatory Visit (INDEPENDENT_AMBULATORY_CARE_PROVIDER_SITE_OTHER): Admitting: Podiatry

## 2023-06-26 ENCOUNTER — Ambulatory Visit (INDEPENDENT_AMBULATORY_CARE_PROVIDER_SITE_OTHER)

## 2023-06-26 ENCOUNTER — Encounter: Payer: Self-pay | Admitting: Podiatry

## 2023-06-26 VITALS — Ht 73.0 in | Wt 207.0 lb

## 2023-06-26 DIAGNOSIS — S92215A Nondisplaced fracture of cuboid bone of left foot, initial encounter for closed fracture: Secondary | ICD-10-CM

## 2023-06-26 DIAGNOSIS — M778 Other enthesopathies, not elsewhere classified: Secondary | ICD-10-CM | POA: Diagnosis not present

## 2023-06-26 DIAGNOSIS — D492 Neoplasm of unspecified behavior of bone, soft tissue, and skin: Secondary | ICD-10-CM | POA: Diagnosis not present

## 2023-06-27 NOTE — Progress Notes (Signed)
 Patient has subjective:   Patient ID: William [name], male   DOB: 63 y.o.   MRN: [id number]   HPI A lot of swelling on the bottom of the cuboid left and on the side has been very tender.  States that it swells to about half size of a golf ball underneath the foot neurovasc   ROS      Objective:  Physical Exam  Status intact with previous possible diagnosis of a subtle fracture of the cuboid with increased inflammation and density left plantar foot     Assessment:  Unknown soft tissue mass plantar left with possibility of cuboid injury     Plan:  H&P since this is growing and has enlargement just go ahead and get an MRI with contrast to try to make sure there is no space-occupying area.  I explained this to him he wants to go this path and at this point he is scheduled for MRI with contrast all questions answered

## 2023-06-29 ENCOUNTER — Other Ambulatory Visit: Payer: Self-pay | Admitting: Medical Oncology

## 2023-06-29 ENCOUNTER — Other Ambulatory Visit: Payer: Self-pay | Admitting: Family Medicine

## 2023-07-01 ENCOUNTER — Telehealth: Payer: Self-pay | Admitting: Podiatry

## 2023-07-01 ENCOUNTER — Other Ambulatory Visit: Payer: Self-pay

## 2023-07-01 ENCOUNTER — Other Ambulatory Visit (HOSPITAL_BASED_OUTPATIENT_CLINIC_OR_DEPARTMENT_OTHER): Payer: Self-pay

## 2023-07-01 MED ORDER — APIXABAN 5 MG PO TABS
5.0000 mg | ORAL_TABLET | Freq: Two times a day (BID) | ORAL | 0 refills | Status: DC
Start: 2023-07-01 — End: 2023-08-15
  Filled 2023-07-01: qty 180, 90d supply, fill #0

## 2023-07-01 MED ORDER — CHLORHEXIDINE GLUCONATE 0.12 % MT SOLN
15.0000 mL | Freq: Two times a day (BID) | OROMUCOSAL | 3 refills | Status: AC
  Filled 2023-07-01: qty 473, 16d supply, fill #0
  Filled 2023-10-28: qty 473, 16d supply, fill #1
  Filled 2023-11-24: qty 473, 16d supply, fill #2
  Filled 2024-01-19: qty 473, 16d supply, fill #3

## 2023-07-01 NOTE — Addendum Note (Signed)
 Addended by: Taige Housman L on: 07/01/2023 09:54 AM   Modules accepted: Orders

## 2023-07-01 NOTE — Telephone Encounter (Signed)
 Called to inform doc/nurse that MRI is incorrect and should state : left foot with or without contrast. Pt MRI appt is 5/28

## 2023-07-02 ENCOUNTER — Ambulatory Visit (HOSPITAL_BASED_OUTPATIENT_CLINIC_OR_DEPARTMENT_OTHER): Admission: RE | Admit: 2023-07-02 | Source: Ambulatory Visit

## 2023-07-02 ENCOUNTER — Ambulatory Visit (HOSPITAL_BASED_OUTPATIENT_CLINIC_OR_DEPARTMENT_OTHER)
Admission: RE | Admit: 2023-07-02 | Discharge: 2023-07-02 | Disposition: A | Discharge status: Home / Self Care | Source: Ambulatory Visit | Attending: Podiatry | Admitting: Podiatry

## 2023-07-02 DIAGNOSIS — D492 Neoplasm of unspecified behavior of bone, soft tissue, and skin: Secondary | ICD-10-CM | POA: Diagnosis not present

## 2023-07-02 DIAGNOSIS — R200 Anesthesia of skin: Secondary | ICD-10-CM | POA: Diagnosis not present

## 2023-07-02 DIAGNOSIS — M19072 Primary osteoarthritis, left ankle and foot: Secondary | ICD-10-CM | POA: Diagnosis not present

## 2023-07-02 DIAGNOSIS — R600 Localized edema: Secondary | ICD-10-CM | POA: Diagnosis not present

## 2023-07-02 MED ORDER — GADOBUTROL 1 MMOL/ML IV SOLN
9.0000 mL | Freq: Once | INTRAVENOUS | Status: AC | PRN
  Administered 2023-07-02: 9 mL via INTRAVENOUS

## 2023-07-03 NOTE — Telephone Encounter (Signed)
 When he goes for mri let them know it is the left foot

## 2023-07-06 DIAGNOSIS — F4322 Adjustment disorder with anxiety: Secondary | ICD-10-CM | POA: Diagnosis not present

## 2023-07-09 ENCOUNTER — Other Ambulatory Visit (HOSPITAL_COMMUNITY): Payer: Self-pay

## 2023-07-09 ENCOUNTER — Encounter: Payer: Self-pay | Admitting: Podiatry

## 2023-07-13 DIAGNOSIS — F4322 Adjustment disorder with anxiety: Secondary | ICD-10-CM | POA: Diagnosis not present

## 2023-07-16 ENCOUNTER — Ambulatory Visit: Admitting: Podiatry

## 2023-07-17 ENCOUNTER — Telehealth: Payer: Self-pay

## 2023-07-17 NOTE — Telephone Encounter (Signed)
 Pharmacy Patient Advocate Encounter   Received notification from CoverMyMeds that prior authorization for Testosterone  Enanthate 200MG /ML solution  is required/requested.   Insurance verification completed.   The patient is insured through [facility] .   Per test claim: PA required; PA started via CoverMyMeds. KEY [id number] . Waiting for clinical questions to populate.

## 2023-07-20 DIAGNOSIS — F4322 Adjustment disorder with anxiety: Secondary | ICD-10-CM | POA: Diagnosis not present

## 2023-07-21 ENCOUNTER — Encounter (HOSPITAL_COMMUNITY): Payer: Self-pay

## 2023-07-22 NOTE — Telephone Encounter (Signed)
 Clinical questions have been answered and PA submitted. PA currently Pending.

## 2023-07-23 NOTE — Telephone Encounter (Signed)
 Please check on status of this mri

## 2023-07-25 ENCOUNTER — Encounter: Payer: Self-pay | Admitting: Family Medicine

## 2023-07-27 DIAGNOSIS — F4322 Adjustment disorder with anxiety: Secondary | ICD-10-CM | POA: Diagnosis not present

## 2023-07-28 ENCOUNTER — Ambulatory Visit (INDEPENDENT_AMBULATORY_CARE_PROVIDER_SITE_OTHER): Admitting: Podiatry

## 2023-07-28 ENCOUNTER — Encounter: Payer: Self-pay | Admitting: Podiatry

## 2023-07-28 VITALS — Ht 73.0 in | Wt 207.0 lb

## 2023-07-28 DIAGNOSIS — M7672 Peroneal tendinitis, left leg: Secondary | ICD-10-CM | POA: Diagnosis not present

## 2023-07-28 MED ORDER — TRIAMCINOLONE ACETONIDE 10 MG/ML IJ SUSP
10.0000 mg | Freq: Once | INTRAMUSCULAR | Status: AC
  Administered 2023-07-28: 10 mg via INTRA_ARTICULAR

## 2023-07-29 DIAGNOSIS — M9902 Segmental and somatic dysfunction of thoracic region: Secondary | ICD-10-CM | POA: Diagnosis not present

## 2023-07-29 DIAGNOSIS — M62830 Muscle spasm of back: Secondary | ICD-10-CM | POA: Diagnosis not present

## 2023-07-29 DIAGNOSIS — M9903 Segmental and somatic dysfunction of lumbar region: Secondary | ICD-10-CM | POA: Diagnosis not present

## 2023-07-29 DIAGNOSIS — M5415 Radiculopathy, thoracolumbar region: Secondary | ICD-10-CM | POA: Diagnosis not present

## 2023-07-29 DIAGNOSIS — M546 Pain in thoracic spine: Secondary | ICD-10-CM | POA: Diagnosis not present

## 2023-07-29 NOTE — Progress Notes (Signed)
 Subjective:   Patient ID: William [name], male   DOB: 63 y.o.   MRN: [id number]   HPI Patient states he is getting periodic discomfort in his left lateral foot not significant and he states medication has been effective in the past.  He also gets some swelling in the bottom of the right foot not to the same degree and he is interested in injection to try to help the discomfort presents   ROS      Objective:  Physical Exam  Neurovascular status intact with patient found to have inflammation around the base of the fifth metatarsal and plantar that is only mildly tender and patient did have MRI with contrast that did indicate the possibility for arthritis in the fifth metatarsal cuneiform joint     Assessment:  Inflammatory condition left with probability for moderate bone issues but symptoms are mild     Plan:  H&P reviewed we did discuss fusion of the bone which would be very difficult in this area and his symptoms are mild so I do not think that is necessary currently he agrees completely states he has done well with previous injection and I did go ahead today and I explained injection and risk of doing this and that ultimately surgery may be necessary.  He is willing to accept this risk and at this time I did do sterile prep I injected around the tendon complex 3 mg dexamethasone Kenalog  5 mg Xylocaine  to reduce the inflammatory process patient understanding chances for tendon rupture and will be seen back as needed but hopefully this will hold him for extended.  Time

## 2023-07-29 NOTE — Telephone Encounter (Signed)
 Pharmacy Patient Advocate Encounter  Received notification from [facility] that Prior Authorization for Testosterone  Enanthate 200MG /ML solution has been APPROVED from 06-22-2023 to 07-21-2024   PA #/Case ID/Reference #: [id number]

## 2023-08-05 ENCOUNTER — Other Ambulatory Visit (HOSPITAL_BASED_OUTPATIENT_CLINIC_OR_DEPARTMENT_OTHER): Payer: Self-pay

## 2023-08-05 ENCOUNTER — Encounter: Payer: Self-pay | Admitting: Family Medicine

## 2023-08-05 ENCOUNTER — Other Ambulatory Visit: Payer: Self-pay | Admitting: Family Medicine

## 2023-08-05 ENCOUNTER — Ambulatory Visit (INDEPENDENT_AMBULATORY_CARE_PROVIDER_SITE_OTHER): Admitting: Family Medicine

## 2023-08-05 VITALS — BP 126/72 | HR 87 | Temp 98.0°F | Resp 16 | Ht 73.0 in | Wt 207.0 lb

## 2023-08-05 DIAGNOSIS — N529 Male erectile dysfunction, unspecified: Secondary | ICD-10-CM | POA: Diagnosis not present

## 2023-08-05 DIAGNOSIS — N3001 Acute cystitis with hematuria: Secondary | ICD-10-CM | POA: Diagnosis not present

## 2023-08-05 LAB — POC URINALSYSI DIPSTICK (AUTOMATED)
Bilirubin, UA: NEGATIVE
Glucose, UA: NEGATIVE
Ketones, UA: NEGATIVE
Nitrite, UA: NEGATIVE
Protein, UA: POSITIVE — AB
Spec Grav, UA: 1.015 (ref 1.010–1.025)
Urobilinogen, UA: 0.2 U/dL
pH, UA: 5 (ref 5.0–8.0)

## 2023-08-05 MED ORDER — CEPHALEXIN 500 MG PO CAPS
500.0000 mg | ORAL_CAPSULE | Freq: Three times a day (TID) | ORAL | 0 refills | Status: AC
  Filled 2023-08-05: qty 21, 7d supply, fill #0

## 2023-08-05 MED ORDER — FLUTICASONE PROPIONATE 50 MCG/ACT NA SUSP
2.0000 | Freq: Every day | NASAL | 1 refills | Status: DC
  Filled 2023-08-05: qty 16, 30d supply, fill #0
  Filled 2023-08-22 – 2023-08-28 (×2): qty 16, 30d supply, fill #1

## 2023-08-05 MED ORDER — TADALAFIL 20 MG PO TABS
10.0000 mg | ORAL_TABLET | Freq: Every day | ORAL | 2 refills | Status: AC | PRN
  Filled 2023-08-05: qty 10, 10d supply, fill #0
  Filled 2023-08-22: qty 10, 10d supply, fill #1

## 2023-08-05 NOTE — Addendum Note (Signed)
 Addended by: Kierstan Auer M on: 08/05/2023 03:06 PM   Modules accepted: Orders

## 2023-08-05 NOTE — Progress Notes (Signed)
 Chief Complaint  Patient presents with   Urinary Tract Infection    William [name] is a 63 y.o. male here for possible UTI.  Duration: 2 weeks. Symptoms: Dysuria, urinary frequency, urinary hesitancy, urinary retention, and urgency Denies: hematuria and fever, discharge Hx of recurrent UTI? No He is circumcised.  Denies new sexual partners.  Hx of ED. Viagra  was not helping. Desire is there. Interested in Cialis .   Past Medical History:  Diagnosis Date   Adjustment disorder with mixed anxiety and depressed mood    Arthritis    Asthma    history   Diabetes mellitus    type 2   Elevated blood pressure    history of high blood pressure readings   History of chicken pox    Hypercholesteremia    Hypertension    Internal hemorrhoids    Tubular adenoma of colon      BP 126/72 (BP Location: Left Arm, Patient Position: Sitting)   Pulse 87   Temp 98 F (36.7 C) (Oral)   Resp 16   Ht 6' 1 (1.854 m)   Wt 207 lb (93.9 kg)   SpO2 95%   BMI 27.31 kg/m  General: Awake, alert, appears stated age Heart: RRR Lungs: CTAB, normal respiratory effort, no accessory muscle usage Abd: BS+, soft, NT, ND, no masses or organomegaly MSK: No CVA tenderness, neg Lloyd's sign Psych: Age appropriate judgment and insight  Acute cystitis with hematuria  Erectile dysfunction, unspecified erectile dysfunction type  Stay hydrated. UA suggestive of infection. 7 d of Keflex. Seek immediate care if pt starts to develop fevers, new/worsening symptoms, uncontrollable N/V. Chronic, not controlled. Stop Viagra . Start Cialis  10-20 mg every day prn. GoodRx is a consideration if the med is not affordable. Urology referral if no improvement.  F/u prn. The patient voiced understanding and agreement to the plan.  Mabel Mt Sammy Martinez, DO 08/05/23 2:47 PM

## 2023-08-05 NOTE — Patient Instructions (Signed)
 Stay hydrated.   Warning signs/symptoms: Uncontrollable nausea/vomiting, fevers, worsening symptoms despite treatment, confusion.  Give [place] around 2 business days to get culture back to you.  Let us know if you need anything.

## 2023-08-07 ENCOUNTER — Encounter: Payer: Self-pay | Admitting: Family Medicine

## 2023-08-07 ENCOUNTER — Ambulatory Visit: Payer: Self-pay | Admitting: Family Medicine

## 2023-08-07 LAB — URINE CULTURE
MICRO NUMBER:: 16646664
SPECIMEN QUALITY:: ADEQUATE

## 2023-08-10 DIAGNOSIS — F4322 Adjustment disorder with anxiety: Secondary | ICD-10-CM | POA: Diagnosis not present

## 2023-08-10 IMAGING — XA DG INJECT/[PERSON_NAME] INC NEEDLE/CATH/PLC EPI/CERV/THOR W/IMG
2 series · 2 of 2 positions shown · non-contrast
Comparison: none

CLINICAL DATA: Spondylosis without myelopathy. Patient reports 60%
improvement from the initial injection. Mild residual right arm and
hand symptoms.

[Series 1: ortho standard · 1 of 1 slices shown (1 of 2)]
[im 1/1]
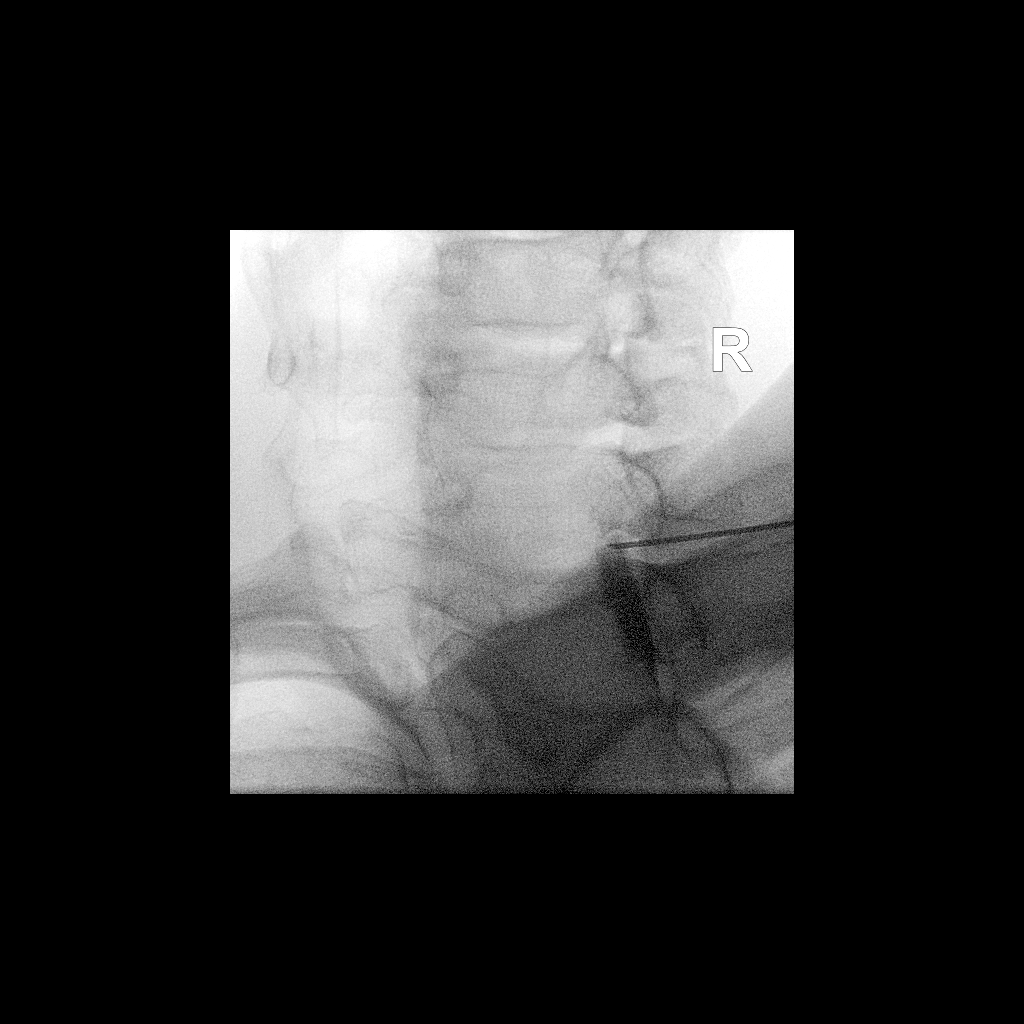

[Series 2: ortho standard · 1 of 1 slices shown (2 of 2)]
[im 1/1]
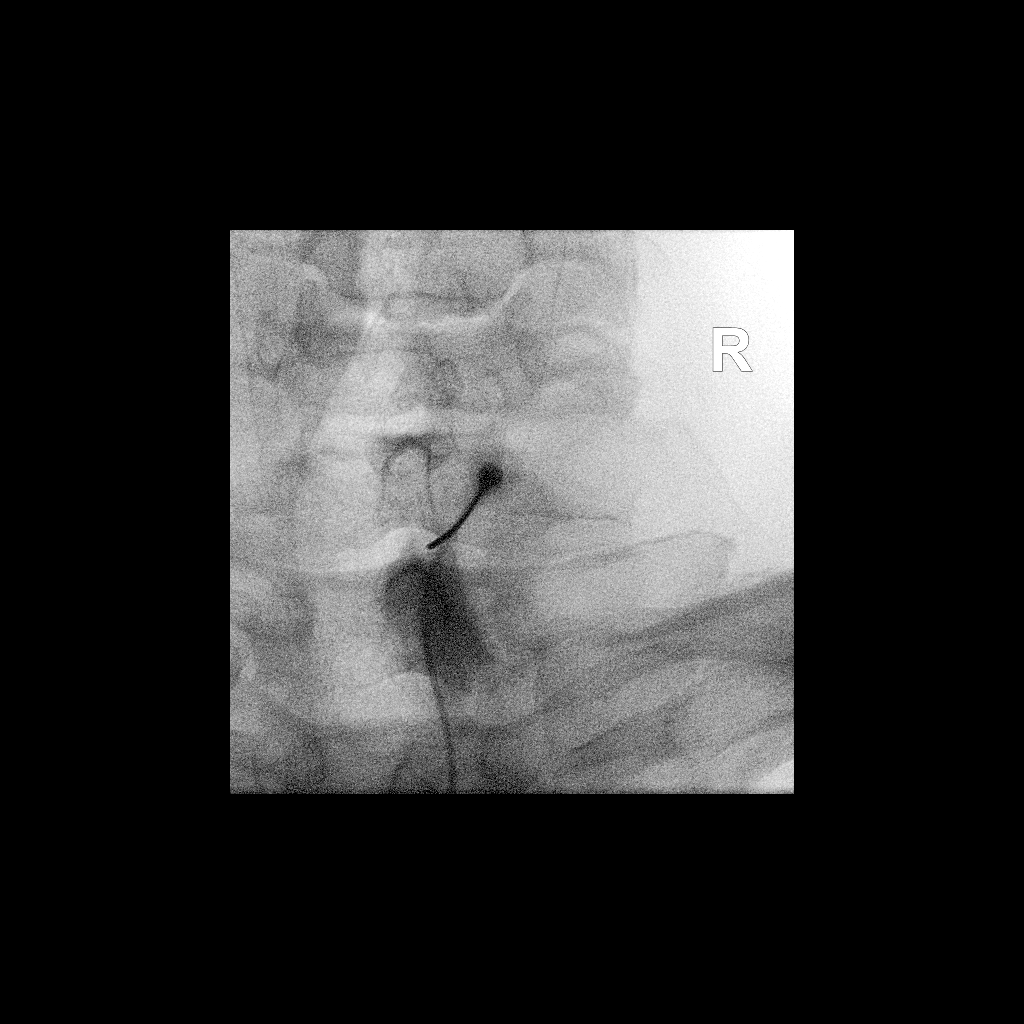

[2 of 2 positions shown; findings below may reference images not displayed]

FLUOROSCOPY:
Radiation Exposure Index (as provided by the fluoroscopic device): 0
minutes 29 seconds. 10.20 micro gray meter squared

PROCEDURE:
CERVICAL EPIDURAL INJECTION

An interlaminar approach was performed on the right at C7-T1. A 20
gauge epidural needle was advanced using loss-of-resistance
technique.

DIAGNOSTIC EPIDURAL INJECTION

Injection of Isovue-M 300 shows a good epidural pattern with spread
above and below the level of needle placement, primarily on the
right. No vascular opacification is seen. THERAPEUTIC

EPIDURAL INJECTION

1.5 ml of Kenalog 40 mixed with 1 ml of 1% Lidocaine and 2 ml of
normal saline were then instilled. The procedure was well-tolerated,
and the patient was discharged thirty minutes following the
injection in good condition.
IMPRESSION: Technically successful second epidural injection on the right at
C7-T1.

## 2023-08-13 ENCOUNTER — Other Ambulatory Visit: Payer: Self-pay | Admitting: Family Medicine

## 2023-08-13 ENCOUNTER — Other Ambulatory Visit (HOSPITAL_BASED_OUTPATIENT_CLINIC_OR_DEPARTMENT_OTHER): Payer: Self-pay

## 2023-08-13 MED ORDER — NITROFURANTOIN MONOHYD MACRO 100 MG PO CAPS
100.0000 mg | ORAL_CAPSULE | Freq: Two times a day (BID) | ORAL | 0 refills | Status: AC
  Filled 2023-08-13: qty 14, 7d supply, fill #0

## 2023-08-14 ENCOUNTER — Other Ambulatory Visit (HOSPITAL_BASED_OUTPATIENT_CLINIC_OR_DEPARTMENT_OTHER): Payer: Self-pay

## 2023-08-15 ENCOUNTER — Other Ambulatory Visit: Payer: Self-pay | Admitting: Medical Oncology

## 2023-08-15 ENCOUNTER — Other Ambulatory Visit: Payer: Self-pay | Admitting: Family Medicine

## 2023-08-15 ENCOUNTER — Other Ambulatory Visit (HOSPITAL_BASED_OUTPATIENT_CLINIC_OR_DEPARTMENT_OTHER): Payer: Self-pay

## 2023-08-15 ENCOUNTER — Other Ambulatory Visit: Payer: Self-pay

## 2023-08-15 ENCOUNTER — Other Ambulatory Visit (HOSPITAL_COMMUNITY): Payer: Self-pay | Admitting: Psychiatry

## 2023-08-15 DIAGNOSIS — Z630 Problems in relationship with spouse or partner: Secondary | ICD-10-CM

## 2023-08-15 DIAGNOSIS — F4312 Post-traumatic stress disorder, chronic: Secondary | ICD-10-CM

## 2023-08-15 DIAGNOSIS — N401 Enlarged prostate with lower urinary tract symptoms: Secondary | ICD-10-CM

## 2023-08-15 DIAGNOSIS — F331 Major depressive disorder, recurrent, moderate: Secondary | ICD-10-CM

## 2023-08-15 MED ORDER — FAMOTIDINE 20 MG PO TABS
20.0000 mg | ORAL_TABLET | Freq: Two times a day (BID) | ORAL | 1 refills | Status: DC
  Filled 2023-08-15: qty 150, 75d supply, fill #0
  Filled 2023-10-28: qty 150, 75d supply, fill #1

## 2023-08-15 MED ORDER — TAMSULOSIN HCL 0.4 MG PO CAPS
0.8000 mg | ORAL_CAPSULE | Freq: Every day | ORAL | 1 refills | Status: DC
  Filled 2023-08-15: qty 150, 75d supply, fill #0

## 2023-08-17 DIAGNOSIS — F4322 Adjustment disorder with anxiety: Secondary | ICD-10-CM | POA: Diagnosis not present

## 2023-08-18 ENCOUNTER — Other Ambulatory Visit (HOSPITAL_BASED_OUTPATIENT_CLINIC_OR_DEPARTMENT_OTHER): Payer: Self-pay

## 2023-08-18 MED ORDER — APIXABAN 5 MG PO TABS
5.0000 mg | ORAL_TABLET | Freq: Two times a day (BID) | ORAL | 0 refills | Status: DC
Start: 2023-08-18 — End: 2023-10-28
  Filled 2023-08-18: qty 144, 72d supply, fill #0

## 2023-08-19 ENCOUNTER — Other Ambulatory Visit: Payer: Self-pay

## 2023-08-19 ENCOUNTER — Other Ambulatory Visit: Payer: Self-pay | Admitting: "Endocrinology

## 2023-08-19 ENCOUNTER — Other Ambulatory Visit (HOSPITAL_BASED_OUTPATIENT_CLINIC_OR_DEPARTMENT_OTHER): Payer: Self-pay

## 2023-08-19 ENCOUNTER — Other Ambulatory Visit: Payer: Self-pay | Admitting: Family Medicine

## 2023-08-19 DIAGNOSIS — R399 Unspecified symptoms and signs involving the genitourinary system: Secondary | ICD-10-CM

## 2023-08-19 DIAGNOSIS — E119 Type 2 diabetes mellitus without complications: Secondary | ICD-10-CM

## 2023-08-19 MED ORDER — LISINOPRIL 10 MG PO TABS
10.0000 mg | ORAL_TABLET | Freq: Every day | ORAL | 1 refills | Status: DC
  Filled 2023-08-19: qty 90, 90d supply, fill #0
  Filled 2023-08-19: qty 60, 60d supply, fill #0
  Filled 2023-10-28: qty 90, 90d supply, fill #1

## 2023-08-20 ENCOUNTER — Other Ambulatory Visit (HOSPITAL_BASED_OUTPATIENT_CLINIC_OR_DEPARTMENT_OTHER): Payer: Self-pay

## 2023-08-21 ENCOUNTER — Other Ambulatory Visit (HOSPITAL_BASED_OUTPATIENT_CLINIC_OR_DEPARTMENT_OTHER): Payer: Self-pay

## 2023-08-21 ENCOUNTER — Other Ambulatory Visit: Payer: Self-pay | Admitting: Internal Medicine

## 2023-08-21 ENCOUNTER — Ambulatory Visit (INDEPENDENT_AMBULATORY_CARE_PROVIDER_SITE_OTHER): Admitting: Urology

## 2023-08-21 ENCOUNTER — Encounter: Payer: Self-pay | Admitting: Urology

## 2023-08-21 ENCOUNTER — Other Ambulatory Visit (HOSPITAL_COMMUNITY): Payer: Self-pay | Admitting: Psychiatry

## 2023-08-21 VITALS — BP 110/68 | HR 94 | Ht 72.0 in | Wt 190.0 lb

## 2023-08-21 DIAGNOSIS — E119 Type 2 diabetes mellitus without complications: Secondary | ICD-10-CM

## 2023-08-21 DIAGNOSIS — R3914 Feeling of incomplete bladder emptying: Secondary | ICD-10-CM

## 2023-08-21 DIAGNOSIS — Z630 Problems in relationship with spouse or partner: Secondary | ICD-10-CM

## 2023-08-21 DIAGNOSIS — F331 Major depressive disorder, recurrent, moderate: Secondary | ICD-10-CM

## 2023-08-21 DIAGNOSIS — R399 Unspecified symptoms and signs involving the genitourinary system: Secondary | ICD-10-CM

## 2023-08-21 DIAGNOSIS — Z87440 Personal history of urinary (tract) infections: Secondary | ICD-10-CM

## 2023-08-21 DIAGNOSIS — N419 Inflammatory disease of prostate, unspecified: Secondary | ICD-10-CM

## 2023-08-21 DIAGNOSIS — R8290 Unspecified abnormal findings in urine: Secondary | ICD-10-CM

## 2023-08-21 DIAGNOSIS — N529 Male erectile dysfunction, unspecified: Secondary | ICD-10-CM

## 2023-08-21 DIAGNOSIS — E291 Testicular hypofunction: Secondary | ICD-10-CM

## 2023-08-21 DIAGNOSIS — R3915 Urgency of urination: Secondary | ICD-10-CM

## 2023-08-21 DIAGNOSIS — R3912 Poor urinary stream: Secondary | ICD-10-CM

## 2023-08-21 DIAGNOSIS — R350 Frequency of micturition: Secondary | ICD-10-CM

## 2023-08-21 DIAGNOSIS — F4312 Post-traumatic stress disorder, chronic: Secondary | ICD-10-CM

## 2023-08-21 LAB — BLADDER SCAN AMB NON-IMAGING: Scan Result: 153

## 2023-08-21 MED ORDER — QUETIAPINE FUMARATE 200 MG PO TABS
200.0000 mg | ORAL_TABLET | Freq: Every day | ORAL | 0 refills | Status: DC
  Filled 2023-08-21: qty 51, 51d supply, fill #0
  Filled 2023-08-21: qty 90, 90d supply, fill #0

## 2023-08-21 MED ORDER — LAMOTRIGINE 200 MG PO TABS
200.0000 mg | ORAL_TABLET | Freq: Every day | ORAL | 0 refills | Status: DC
  Filled 2023-08-21: qty 51, 51d supply, fill #0
  Filled 2023-08-21: qty 90, 90d supply, fill #0

## 2023-08-21 MED ORDER — ALFUZOSIN HCL ER 10 MG PO TB24
10.0000 mg | ORAL_TABLET | Freq: Every day | ORAL | 11 refills | Status: DC
  Filled 2023-08-21: qty 30, 30d supply, fill #0

## 2023-08-21 MED ORDER — SULFAMETHOXAZOLE-TRIMETHOPRIM 800-160 MG PO TABS
1.0000 | ORAL_TABLET | Freq: Two times a day (BID) | ORAL | 0 refills | Status: AC
  Filled 2023-08-21: qty 40, 20d supply, fill #0

## 2023-08-21 NOTE — Progress Notes (Signed)
 Assessment: 1. Lower urinary tract symptoms   2. Prostatitis, unspecified prostatitis type   3. Organic impotence   4. Hypogonadism in male   5. History of UTI   6. Abnormal urine findings     Plan: I personally reviewed the patient's chart including provider notes, lab results. Urine culture sent today for further evaluation of abnormalities noted on UA and history of UTI. His exam is consistent with prostatitis.  I discussed the diagnosis and management of prostatitis. Begin Bactrim  DS twice daily x 20 days for prostatitis/UTI. D/C tamsulosin . Begin alfuzosin  10 mg daily for lower urinary tract symptoms.  Prescription sent. Recommend repeating a testosterone  level mid cycle.  He is due for an injection today. Testosterone  level on 08/25/23.  I had a discussion with the patient  discussing ED.  I discussed the pathophysiology, etiology, and natural history of ED as well as management options using a goal-oriented approach.  We discussed the following options: Medical therapy, vacuum erection device, penile injections, intraurethral suppository therapy (MUSE), and penile prosthesis. Information on VED provided.  Return to office in 6 weeks.   Chief Complaint:  Chief Complaint  Patient presents with   LUTS    History of Present Illness:  William [name] is a 63 y.o. male who is seen for evaluation of lower urinary tract symptoms and erectile dysfunction.  He was previously seen in 12/23 for a rising PSA.  PSA results: 5/19 0.3 9/19 0.38 2/20 0.36 7/20 0.37 5/21 0.26 11/21 0.67 3/22 0.28 5/23 0.24 9/23 3.21 9/24 0.54 5/25 0.49  No history of elevated PSA.  No history of UTIs or prostatitis.  No family history of prostate cancer.  He has been in on testosterone  replacement therapy with testosterone  injections 50 mg every 2 weeks.   Testosterone  level from 9/23: 617 He has been on testosterone  replacement therapy for approximately 10 years.  He initially used  topical gel without significant benefit.  He has been receiving short acting injections for approximately 6 years.  He is currently being followed by Dr. Sam with endocrinology. Most recent testosterone  level from 5/25: 120  He has lower urinary tract symptoms including frequency, urgency, nocturia x 2, decreased stream, intermittent stream, and sensation of incomplete emptying.  He has also noted occasional dysuria.  No gross hematuria. IPSS = 25. He did not wish to pursue any medical treatment at the time of his visit in December 2023.  He presents today for further evaluation of increased lower urinary tract symptoms and erectile dysfunction. He reports urinary symptoms including urgency, intermittent stream, weak stream, frequency, and sensation of incomplete emptying.  He has nocturia x 1.  He also has slight dysuria with voiding.  No gross hematuria or flank pain.  He has been on tamsulosin  0.8 mg for at least 6 months but has not seen a significant change in his symptoms. IPSS = 17/5. He was recently diagnosed with a UTI. Urine culture from 08/05/2023 grew 50-100 K Klebsiella. He was initially treated with cephalexin  and changed to Macrobid  which he has completed.  He continues on testosterone  injections 50 mg every week.  He last injected 1 week ago. He continues to have problems with erectile dysfunction.  He reports inability to achieve an adequate erection for intercourse.  He reports approximately 20% rigidity.  No pain or curvature with erection.  No nocturnal or early morning erections.  He has tried tadalafil  and sildenafil  without benefit.   Portions of the above documentation were copied  from a prior visit for review purposes only.   Past Medical History:  Past Medical History:  Diagnosis Date   Adjustment disorder with mixed anxiety and depressed mood    Arthritis    Asthma    history   Diabetes mellitus    type 2   Elevated blood pressure    history of high  blood pressure readings   History of chicken pox    Hypercholesteremia    Hypertension    Internal hemorrhoids    Tubular adenoma of colon     Past Surgical History:  Past Surgical History:  Procedure Laterality Date   ELBOW SURGERY     right elbow 2010, left elbow 08/06/10 Rodman Schimke)   KNEE SURGERY     right knee new ACL- [phone]   KNEE SURGERY     left knee 2003   NASAL SEPTUM SURGERY     1992   PARTIAL HIP ARTHROPLASTY     right hip replacement    Allergies:  No Known Allergies  Family History:  Family History  Problem Relation Age of Onset   Diabetes Mother    Thyroid  disease Mother        Questionable   Heart disease Father        deceased   Other Neg Hx        hypogonadism   Colon cancer Neg Hx     Social History:  Social History   Tobacco Use   Smoking status: Never   Smokeless tobacco: Never  Substance Use Topics   Alcohol use: Yes    Alcohol/week: 7.0 - 14.0 standard drinks of alcohol    Types: 7 - 14 Glasses of wine per week   Drug use: No    ROS: Constitutional:  Negative for fever, chills, weight loss CV: Negative for chest pain, previous MI, hypertension Respiratory:  Negative for shortness of breath, wheezing, sleep apnea, frequent cough GI:  Negative for nausea, vomiting, bloody stool, GERD  Physical exam: BP 110/68   Pulse 94   Ht 6' (1.829 m)   Wt 190 lb (86.2 kg)   BMI 25.77 kg/m  GENERAL APPEARANCE:  Well appearing, well developed, well nourished, NAD HEENT:  Atraumatic, normocephalic, oropharynx clear NECK:  Supple without lymphadenopathy or thyromegaly ABDOMEN:  Soft, non-tender, no masses EXTREMITIES:  Moves all extremities well, without clubbing, cyanosis, or edema NEUROLOGIC:  Alert and oriented x 3, normal gait, CN II-XII grossly intact MENTAL STATUS:  appropriate BACK:  Non-tender to palpation, No CVAT SKIN:  Warm, dry, and intact GU: Penis:  circumcised Meatus: Normal Scrotum: normal, no masses Testis:  normal without masses bilateral Prostate: 40 g, tender, no nodules Rectum: Normal tone,  no masses or tenderness   Results: U/A: 6-10 WBCs, 3-10 RBCs, few bacteria, nitrite positive  PVR = 154 ml

## 2023-08-22 ENCOUNTER — Other Ambulatory Visit: Payer: Self-pay

## 2023-08-22 ENCOUNTER — Encounter: Payer: Self-pay | Admitting: Internal Medicine

## 2023-08-22 ENCOUNTER — Other Ambulatory Visit (HOSPITAL_BASED_OUTPATIENT_CLINIC_OR_DEPARTMENT_OTHER): Payer: Self-pay

## 2023-08-22 DIAGNOSIS — R399 Unspecified symptoms and signs involving the genitourinary system: Secondary | ICD-10-CM

## 2023-08-22 MED ORDER — ALFUZOSIN HCL ER 10 MG PO TB24
10.0000 mg | ORAL_TABLET | Freq: Every day | ORAL | 3 refills | Status: AC
  Filled 2023-08-22 (×2): qty 90, 90d supply, fill #0
  Filled 2023-11-03: qty 90, 90d supply, fill #1
  Filled 2024-01-27: qty 90, 90d supply, fill #2
  Filled ????-??-??: fill #1

## 2023-08-24 LAB — URINE CULTURE

## 2023-08-25 ENCOUNTER — Other Ambulatory Visit: Payer: Self-pay | Admitting: Internal Medicine

## 2023-08-25 ENCOUNTER — Other Ambulatory Visit (HOSPITAL_BASED_OUTPATIENT_CLINIC_OR_DEPARTMENT_OTHER): Payer: Self-pay

## 2023-08-25 ENCOUNTER — Encounter: Payer: Self-pay | Admitting: Internal Medicine

## 2023-08-25 ENCOUNTER — Ambulatory Visit: Payer: Self-pay | Admitting: Urology

## 2023-08-25 ENCOUNTER — Other Ambulatory Visit

## 2023-08-25 DIAGNOSIS — E119 Type 2 diabetes mellitus without complications: Secondary | ICD-10-CM

## 2023-08-25 MED ORDER — BASAGLAR KWIKPEN 100 UNIT/ML ~~LOC~~ SOPN
35.0000 [IU] | PEN_INJECTOR | Freq: Every day | SUBCUTANEOUS | 3 refills | Status: AC
  Filled 2023-08-25 – 2023-10-28 (×2): qty 45, 128d supply, fill #0

## 2023-08-25 MED ORDER — INSULIN GLARGINE 100 UNIT/ML SOLOSTAR PEN
45.0000 [IU] | PEN_INJECTOR | Freq: Every day | SUBCUTANEOUS | 3 refills | Status: AC
  Filled 2023-08-25: qty 45, 100d supply, fill #0
  Filled 2023-10-28 – 2023-11-10 (×2): qty 45, 100d supply, fill #1
  Filled 2024-01-27: qty 45, 100d supply, fill #2

## 2023-08-26 ENCOUNTER — Other Ambulatory Visit (HOSPITAL_BASED_OUTPATIENT_CLINIC_OR_DEPARTMENT_OTHER): Payer: Self-pay

## 2023-08-26 ENCOUNTER — Other Ambulatory Visit

## 2023-08-26 ENCOUNTER — Telehealth: Payer: Self-pay

## 2023-08-26 DIAGNOSIS — E291 Testicular hypofunction: Secondary | ICD-10-CM

## 2023-08-26 NOTE — Telephone Encounter (Signed)
 Rx sent.

## 2023-08-26 NOTE — Telephone Encounter (Signed)
 Pharmacy Patient Advocate Encounter   Received notification from Pt Calls Messages that prior authorization for Mounjaro  5mg  is required/requested.   Insurance verification completed.   The patient is insured through [facility] ADVANTAGE/RX ADVANCE .   Per test claim: PA required and submitted KEY/EOC/Request #: [id number] due to Prior Authorization duplicate/in progress.   It looks like a request was already submitted outside the PA team. Will keep an eye out for a determination.

## 2023-08-26 NOTE — Telephone Encounter (Signed)
 PA needed for [facility].

## 2023-08-27 DIAGNOSIS — F4322 Adjustment disorder with anxiety: Secondary | ICD-10-CM | POA: Diagnosis not present

## 2023-08-27 LAB — TESTOSTERONE: Testosterone: 732 ng/dL (ref 264–916)

## 2023-08-28 ENCOUNTER — Other Ambulatory Visit (HOSPITAL_BASED_OUTPATIENT_CLINIC_OR_DEPARTMENT_OTHER): Payer: Self-pay

## 2023-08-29 ENCOUNTER — Other Ambulatory Visit (HOSPITAL_COMMUNITY): Payer: Self-pay

## 2023-08-29 ENCOUNTER — Telehealth: Payer: Self-pay

## 2023-08-29 ENCOUNTER — Other Ambulatory Visit (HOSPITAL_BASED_OUTPATIENT_CLINIC_OR_DEPARTMENT_OTHER): Payer: Self-pay

## 2023-08-29 NOTE — Telephone Encounter (Signed)
 Please notify me once the appeal has been starting for patient.

## 2023-08-29 NOTE — Telephone Encounter (Signed)
 Pharmacy Patient Advocate Encounter   Received notification from CoverMyMeds that prior authorization for testosterone  enanthate  is required/requested.   Insurance verification completed.   The patient is insured through [facility] ADVANTAGE/RX ADVANCE.   Per test claim: PA required; PA submitted to above mentioned insurance via CoverMyMeds Key/confirmation #/EOC [id number] Status is pending

## 2023-08-29 NOTE — Telephone Encounter (Signed)
 Received a fax from [employer] requesting additional information. FORM has been completed and faxed back. Determination pending.

## 2023-09-01 ENCOUNTER — Other Ambulatory Visit (HOSPITAL_BASED_OUTPATIENT_CLINIC_OR_DEPARTMENT_OTHER): Payer: Self-pay

## 2023-09-01 NOTE — Telephone Encounter (Signed)
 Pharmacy Patient Advocate Encounter  Received notification from [facility] ADVANTAGE/RX ADVANCE that Prior Authorization for Testosterone  enanthate has been APPROVED from 08/29/23 to 08/28/24   PA #/Case ID/Reference #: [id number]

## 2023-09-02 ENCOUNTER — Other Ambulatory Visit (HOSPITAL_COMMUNITY): Payer: Self-pay

## 2023-09-02 ENCOUNTER — Encounter: Payer: Self-pay | Admitting: Internal Medicine

## 2023-09-02 ENCOUNTER — Other Ambulatory Visit (HOSPITAL_BASED_OUTPATIENT_CLINIC_OR_DEPARTMENT_OTHER): Payer: Self-pay

## 2023-09-02 ENCOUNTER — Ambulatory Visit (INDEPENDENT_AMBULATORY_CARE_PROVIDER_SITE_OTHER): Admitting: Internal Medicine

## 2023-09-02 VITALS — BP 120/70 | HR 87 | Ht 72.0 in | Wt 200.0 lb

## 2023-09-02 DIAGNOSIS — Z794 Long term (current) use of insulin: Secondary | ICD-10-CM | POA: Diagnosis not present

## 2023-09-02 DIAGNOSIS — E291 Testicular hypofunction: Secondary | ICD-10-CM

## 2023-09-02 DIAGNOSIS — Z7985 Long-term (current) use of injectable non-insulin antidiabetic drugs: Secondary | ICD-10-CM

## 2023-09-02 DIAGNOSIS — E119 Type 2 diabetes mellitus without complications: Secondary | ICD-10-CM

## 2023-09-02 DIAGNOSIS — Z7984 Long term (current) use of oral hypoglycemic drugs: Secondary | ICD-10-CM

## 2023-09-02 LAB — POCT GLYCOSYLATED HEMOGLOBIN (HGB A1C): Hemoglobin A1C: 6.8 % — AB (ref 4.0–5.6)

## 2023-09-02 MED ORDER — TIRZEPATIDE 7.5 MG/0.5ML ~~LOC~~ SOAJ
7.5000 mg | SUBCUTANEOUS | 3 refills | Status: AC
  Filled 2023-09-02 – 2023-10-28 (×4): qty 6, 84d supply, fill #0
  Filled 2024-01-27: qty 6, 84d supply, fill #1

## 2023-09-02 MED ORDER — TESTOSTERONE ENANTHATE 200 MG/ML IM SOLN
100.0000 mg | INTRAMUSCULAR | 5 refills | Status: AC
  Filled 2023-09-02 – 2023-10-28 (×2): qty 5, 70d supply, fill #0
  Filled 2023-11-24: qty 5, 70d supply, fill #1
  Filled 2024-01-27: qty 5, 28d supply, fill #1

## 2023-09-02 NOTE — Progress Notes (Unsigned)
 Name: William [name]  MRN/ DOB: [id number], 19-Aug-1960   Age/ Sex: 63 y.o., male    PCP: Frann Mabel Mt, DO   Reason for Endocrinology Evaluation: Type 2 Diabetes Mellitus     Date of Initial Endocrinology Visit: 06/18/2021    PATIENT IDENTIFIER: William [name] is a 63 y.o. male with a past medical history of HTN, T2DM, hypogonadism. The patient presented for initial endocrinology clinic visit on 06/18/2021 for consultative assistance with his diabetes management.    HPI: William [name] was    Diagnosed with DM 2000 Prior Medications tried/Intolerance:  Hemoglobin A1c has ranged from 6.5% in 2020, peaking at 9.9% in 2022.  On his initial visit to our clinic and an A1c of 7.7%, he was on Synjardy , Ozempic , and Basaglar   We switch Ozempic  to Mounjaro  10/2022  TESTOSTERONE  HISTORY:   He has been noted with low testosterone  since 2013, with a nadir of 123 NG/DL in 7984. He stopped taking testosterone  ~ 2 months ago because it did not help with energy help.    He was evaluated by urology 01/2022 due to elevated PSA in the setting of testosterone  therapy, no intervention was offered at the time, no prostate nodules were detected.  SUBJECTIVE:   During the last visit (06/18/2023): He was seen by Dr. Dartha, A1c 7.0%  Today (09/02/23): William [name] is here for a follow up on diabetes and hypogonadism management. He checks his blood sugars multiple  times daily through freestyle libre . The patient has had hypoglycemia on freestyle libre, he is asymptomatic .    Denies nausea or vomiting  He does have constipation  Had recent testosterone  check up through urology  Nocturia is improving  Recent treatment for UTI  He has been trying to adjust basaglar  this week , sometimes takes 40 units and twice a day    HOME ENDOCRINE  REGIMEN: Synjardy  12.06/998 mg 2 tabs daily Basaglar  35 units QAM Mounjaro  5 mg weekly Testosterone  enanthate 200 mg/ML 50 mg (0.25 mL) weekly      Statin: Yes ACE-I/ARB: yes     CONTINUOUS GLUCOSE MONITORING RECORD INTERPRETATION    Dates of Recording: 7/16 - 09/02/2023  Sensor description: Freestyle libre 3+  Results statistics:   CGM use % of time 95  Average and SD 124/32.4  Time in range   84  %  % Time Above 180 8  % Time above 250 1  % Time Below target 5   Glycemic patterns summary: BGs are optimal overnight and throughout the day  Hyperglycemic episodes postprandial  Hypoglycemic episodes occurred at night and during the day  Overnight periods: Mostly optimal   DIABETIC COMPLICATIONS: Microvascular complications:  Nonproliferative DR Denies: CKD, neuropathy  Last eye exam: Completed 02/2020  Macrovascular complications:   Denies: CAD, PVD, CVA   PAST HISTORY: Past Medical History:  Past Medical History:  Diagnosis Date   Adjustment disorder with mixed anxiety and depressed mood    Arthritis    Asthma    history   Diabetes mellitus    type 2   Elevated blood pressure    history of high blood pressure readings   History of chicken pox    Hypercholesteremia    Hypertension    Internal hemorrhoids    Tubular adenoma of colon    Past Surgical History:  Past Surgical History:  Procedure Laterality Date   ELBOW SURGERY     right elbow 2010, left elbow 08/06/10 Rodman Schimke)   KNEE  SURGERY     right knee new ACL- 1993,2000   KNEE SURGERY     left knee 2003   NASAL SEPTUM SURGERY     1992   PARTIAL HIP ARTHROPLASTY     right hip replacement    Social History:  reports that he has never smoked. He has never used smokeless tobacco. He reports current alcohol use of about 7.0 - 14.0 standard drinks of alcohol per week. He reports that he does not use drugs. Family History:  Family History  Problem Relation Age of Onset   Diabetes Mother    Thyroid  disease Mother        Questionable   Heart disease Father        deceased   Other Neg Hx        hypogonadism   Colon cancer Neg  Hx      HOME MEDICATIONS: Allergies as of 09/02/2023   No Known Allergies      Medication List        Accurate as of September 02, 2023  9:36 AM. If you have any questions, ask your nurse or doctor.          alfuzosin  10 MG 24 hr tablet Commonly known as: UROXATRAL  Take 1 tablet (10 mg total) by mouth daily.   B-D 3CC LUER-LOK SYR 22GX1 22G X 1 3 ML Misc Generic drug: SYRINGE-NEEDLE (DISP) 3 ML Use to inject testosterone    Basaglar  KwikPen 100 UNIT/ML Inject 40-45 Units into the skin daily.   Basaglar  KwikPen 100 UNIT/ML Inject 35 Units into the skin daily.   Lantus  SoloStar 100 UNIT/ML Solostar Pen Generic drug: insulin  glargine Inject 45 Units into the skin every night.   carvedilol  12.5 MG tablet Commonly known as: COREG  Take 1 tablet (12.5 mg total) by mouth in the morning and 1 tablet (12.5 mg total) in the evening. Take with meals.   chlorhexidine  0.12 % solution Commonly known as: Periogard  Rinse mouth with 15 mLs (1 capful) for 30 seconds in the morning and evening after tootbrushing. Spit after rinsing, do not swallow.   diclofenac  75 MG EC tablet Commonly known as: VOLTAREN  Take 1 tablet (75 mg total) by mouth 2 (two) times daily.   Eliquis  5 MG Tabs tablet Generic drug: apixaban  Take 1 tablet (5 mg total) by mouth 2 (two) times daily.   famotidine  20 MG tablet Commonly known as: PEPCID  Take 1 tablet (20 mg total) by mouth 2 (two) times daily.   fenofibrate  micronized 200 MG capsule Commonly known as: LOFIBRA Take 1 capsule (200 mg total) by mouth daily before breakfast.   fluticasone  50 MCG/ACT nasal spray Commonly known as: FLONASE  Place 2 sprays into both nostrils daily.   FreeStyle Libre 3 Plus Sensor Misc Use 1 sensor every 15 days as directed.   ipratropium 0.02 % nebulizer solution Commonly known as: ATROVENT  Take 2.5 mLs (0.5 mg total) by nebulization 4 (four) times daily.   ipratropium-albuterol  0.5-2.5 (3) MG/3ML Soln Commonly  known as: DUONEB Take 3 mL by nebulization every 6 (six) hours as needed for wheezing.   lamoTRIgine  200 MG tablet Commonly known as: LAMICTAL  Take 1 tablet (200 mg total) by mouth daily.   lisinopril  10 MG tablet Commonly known as: ZESTRIL  Take 1 tablet (10 mg total) by mouth daily.   Melatonin 10 MG Tabs Take 10 mg by mouth at bedtime.   Mounjaro  5 MG/0.5ML Pen Generic drug: tirzepatide  Inject 5 mg into the skin once a week.  Proctozone -HC 2.5 % rectal cream Generic drug: hydrocortisone  Place 1 Application rectally 2 (two) times daily.   QUEtiapine  200 MG tablet Commonly known as: SEROQUEL  Take 1 tablet (200 mg total) by mouth at bedtime.   rosuvastatin  40 MG tablet Commonly known as: CRESTOR  Take 1 tablet (40 mg total) by mouth daily.   Sodium Fluoride  5000 PPM 1.1 % Pste Generic drug: Sodium Fluoride  Use pea size amount of toothpaste and brush for 2 mins and spit. Do not eat/drink/rinse for 30 mins   sulfamethoxazole -trimethoprim  800-160 MG tablet Commonly known as: BACTRIM  DS Take 1 tablet by mouth every 12 (twelve) hours for 20 days.   Synjardy  12.06-998 MG Tabs Generic drug: Empagliflozin -metFORMIN  HCl Take 1 tablet by mouth 2 (two) times daily.   tadalafil  20 MG tablet Commonly known as: CIALIS  Take 0.5-1 tablets (10-20 mg total) by mouth daily as needed for erectile dysfunction.   testosterone  enanthate 200 MG/ML injection Commonly known as: DELATESTRYL  Inject 0.25 mLs (50 mg total) into the muscle once a week.   TRUEplus 5-Bevel Pen Needles 29G X 12.7MM Misc Generic drug: Insulin  Pen Needle Use as directed for insulin  injection.   Tubing/Wing Tip Misc Once daily         ALLERGIES: No Known Allergies   REVIEW OF SYSTEMS: A comprehensive ROS was conducted with the patient and is negative except as per HPI     OBJECTIVE:   VITAL SIGNS: BP 120/70 (BP Location: Left Arm, Patient Position: Sitting, Cuff Size: Normal)   Pulse 87   Ht 6'  (1.829 m)   Wt 200 lb (90.7 kg)   SpO2 96%   BMI 27.12 kg/m    PHYSICAL EXAM:  General: Pt appears well and is in NAD  Lungs: Clear with good BS bilat  Heart: RRR   Extremities:  Lower extremities - No pretibial edema.  Neuro: MS is good with appropriate affect, pt is alert and Ox3    DM foot exam:09/02/2023  The skin of the feet is intact without sores or ulcerations. The pedal pulses are 2+ on right and 2+ on left. The sensation is intact to a screening 5.07, 10 gram monofilament bilaterally      DATA REVIEWED:  Lab Results  Component Value Date   HGBA1C 7.0 (A) 06/18/2023   HGBA1C 6.7 (A) 03/10/2023   HGBA1C 7.2 (A) 11/01/2022    Latest Reference Range & Units 06/10/23 11:02  Sodium 135 - 145 mEq/L 141  Potassium 3.5 - 5.1 mEq/L 4.5  Chloride 96 - 112 mEq/L 106  CO2 19 - 32 mEq/L 26  Glucose 70 - 99 mg/dL 855 (H)  BUN 6 - 23 mg/dL 20  Creatinine 9.59 - 8.49 mg/dL 8.89  Calcium  8.4 - 10.5 mg/dL 9.4  Alkaline Phosphatase 39 - 117 U/L 48  Albumin 3.5 - 5.2 g/dL 4.7  AST 0 - 37 U/L 23  ALT 0 - 53 U/L 28  Total Protein 6.0 - 8.3 g/dL 6.7  Total Bilirubin 0.2 - 1.2 mg/dL 0.4  GFR >39.99 mL/min 70.70  Total CHOL/HDL Ratio  4  Cholesterol 0 - 200 mg/dL 78  HDL Cholesterol >60.99 mg/dL 79.09 (L)  LDL (calc) 0 - 99 mg/dL 23  NonHDL  42.64  Triglycerides 0.0 - 149.0 mg/dL 826.9 (H)  VLDL 0.0 - 59.9 mg/dL 65.3    ASSESSMENT / PLAN / RECOMMENDATIONS:   1) Type 2 Diabetes Mellitus, Optimally controlled, With retinopathic complications - Most recent A1c of 6.7 %. Goal A1c <7.0%.     -  A1c optimal - It is unclear how accurate the hypoglycemic episodes on freestyle libre, he does not like to check with a fingerstick. - We opted to increase Mounjaro  and decrease Basaglar  as below   MEDICATIONS: Continue Synjardy  12.06-998 mg TWO Tabs QAM  Increase Mounjaro  7.5 mg weekly Decrease Basaglar  32 units daily   EDUCATION / INSTRUCTIONS: BG monitoring instructions:  Patient is instructed to check his blood sugars 3 times a day, before meals . Call [facility] if: BG persistently < 70  I reviewed the Rule of 15 for the treatment of hypoglycemia in detail with the patient. Literature supplied.   2) Diabetic complications:  Eye: Does  have known diabetic retinopathy.  Neuro/ Feet: Does not have known diabetic peripheral neuropathy. Renal: Patient does not have known baseline CKD. He is not on an ACEI/ARB at present  3) Hypogonadism:  -He was evaluated by urology 01/2022 for elevated PSA by >1.4 , no prostate nodules were detected - Per patient, he recently had testosterone  checked through urology with testosterone  levels in the 700 range, but he has self increased it to 1 mL instead of 0.25 mL - Patient would like to continue testosterone  on a weekly basis rather than every 2 weeks, will change the dose as below  Medication  Testosterone  200 mg/ML, 100 mg (0.5 mL) weekly  4) Dyslipidemia :  -Triglycerides have been above goal in the past -Patient on rosuvastatin , and  fenofibrate    Medication Continue rosuvastatin  40 mg daily Continue fenofibrate  200 mg daily  Follow-up in 6 months     Signed electronically by: Stefano Redgie Butts, MD  [facility] End[facility]  [facility] [address] [place], [place] [address] Phone: [phone] FAX: [phone]   CC: Frann Mabel Mt, DO [address] [place] [address] Phone: [phone]  Fax: [phone]    Return to Endocrinology clinic as below: Future Appointments  Date Time Provider Department Center  09/11/2023 10:40 AM Arfeen, Leni DASEN, MD [facility] None  10/02/2023 10:30 AM Roseann Adine PARAS., MD [facility] None  10/14/2023  2:15 PM [facility] None  10/14/2023  2:30 PM Tonette Lauraine HERO, PA-C [facility] None  11/20/2023 10:45 AM Regal, [place] RAMAN, DPM [facility]

## 2023-09-02 NOTE — Patient Instructions (Addendum)
 Continue Synjardy  12.06-998 mg TWO tablets every morning  Increase mounjaro  7.5 mg weekly  Decrease Basaglar  32 units once daily    HOW TO TREAT LOW BLOOD SUGARS (Blood sugar LESS THAN 70 MG/DL) Please follow the RULE OF 15 for the treatment of hypoglycemia treatment (when your (blood sugars are less than 70 mg/dL)   STEP 1: Take 15 grams of carbohydrates when your blood sugar is low, which includes:  3-4 GLUCOSE TABS  OR 3-4 OZ OF JUICE OR REGULAR SODA OR ONE TUBE OF GLUCOSE GEL    STEP 2: RECHECK blood sugar in 15 MINUTES STEP 3: If your blood sugar is still low at the 15 minute recheck --> then, go back to STEP 1 and treat AGAIN with another 15 grams of carbohydrates.

## 2023-09-03 DIAGNOSIS — F4322 Adjustment disorder with anxiety: Secondary | ICD-10-CM | POA: Diagnosis not present

## 2023-09-08 ENCOUNTER — Ambulatory Visit: Payer: Federal, State, Local not specified - PPO | Admitting: Internal Medicine

## 2023-09-08 ENCOUNTER — Other Ambulatory Visit (HOSPITAL_COMMUNITY): Payer: Self-pay

## 2023-09-08 LAB — MICROSCOPIC EXAMINATION

## 2023-09-08 LAB — URINALYSIS, ROUTINE W REFLEX MICROSCOPIC
Bilirubin, UA: NEGATIVE
Ketones, UA: NEGATIVE
Nitrite, UA: POSITIVE — AB
Protein,UA: NEGATIVE
Specific Gravity, UA: 1.02 (ref 1.005–1.030)
Urobilinogen, Ur: 0.2 mg/dL (ref 0.2–1.0)
pH, UA: 5.5 (ref 5.0–7.5)

## 2023-09-09 ENCOUNTER — Other Ambulatory Visit (HOSPITAL_COMMUNITY): Payer: Self-pay

## 2023-09-09 NOTE — Telephone Encounter (Signed)
 Note from 09/04/23 states PA has been approved. Test claim shows a copay of $47 for a 30 day supply

## 2023-09-10 ENCOUNTER — Telehealth: Payer: Self-pay

## 2023-09-10 ENCOUNTER — Other Ambulatory Visit (HOSPITAL_COMMUNITY): Payer: Self-pay

## 2023-09-10 DIAGNOSIS — F4322 Adjustment disorder with anxiety: Secondary | ICD-10-CM | POA: Diagnosis not present

## 2023-09-10 NOTE — Telephone Encounter (Signed)
 Pharmacy Patient Advocate Encounter   Received notification from CoverMyMeds that prior authorization for Synjardy  12.5-1000MG  tablets is required/requested.   Insurance verification completed.   The patient is insured through [facility] .   Per test claim: Refill too soon. PA is not needed at this time. Medication was filled 08/15/23. Next eligible fill date is 09/24/23.   PA on file expired 11/04/23

## 2023-09-11 ENCOUNTER — Other Ambulatory Visit (HOSPITAL_BASED_OUTPATIENT_CLINIC_OR_DEPARTMENT_OTHER): Payer: Self-pay

## 2023-09-11 ENCOUNTER — Ambulatory Visit (HOSPITAL_COMMUNITY): Admitting: Psychiatry

## 2023-09-11 ENCOUNTER — Other Ambulatory Visit: Payer: Self-pay

## 2023-09-11 ENCOUNTER — Encounter (HOSPITAL_COMMUNITY): Payer: Self-pay | Admitting: Psychiatry

## 2023-09-11 ENCOUNTER — Ambulatory Visit (HOSPITAL_BASED_OUTPATIENT_CLINIC_OR_DEPARTMENT_OTHER): Admitting: Psychiatry

## 2023-09-11 DIAGNOSIS — F4312 Post-traumatic stress disorder, chronic: Secondary | ICD-10-CM | POA: Diagnosis not present

## 2023-09-11 DIAGNOSIS — F331 Major depressive disorder, recurrent, moderate: Secondary | ICD-10-CM | POA: Diagnosis not present

## 2023-09-11 DIAGNOSIS — Z630 Problems in relationship with spouse or partner: Secondary | ICD-10-CM | POA: Diagnosis not present

## 2023-09-11 MED ORDER — LAMOTRIGINE 200 MG PO TABS
200.0000 mg | ORAL_TABLET | Freq: Every day | ORAL | 0 refills | Status: DC
  Filled 2023-09-11: qty 90, 90d supply, fill #0

## 2023-09-11 MED ORDER — QUETIAPINE FUMARATE 200 MG PO TABS
200.0000 mg | ORAL_TABLET | Freq: Every day | ORAL | 0 refills | Status: DC
  Filled 2023-09-11: qty 90, 90d supply, fill #0

## 2023-09-11 NOTE — Progress Notes (Signed)
 [facility] MD/PA/NP OP Progress Note  09/11/2023 10:42 AM William [name]  MRN:  [id number]  Chief Complaint:  Chief Complaint  Patient presents with   Follow-up   Medication Refill   HPI: Patient came today to the office for his follow-up appointment.  He reported last time had a argument with his son about loan application for his master program.  Patient told he has to walk out from the car because he was very upset and angry on his son.  Later his wife help him to calm down the situation.  Patient told he regret about his behavior and later sent apology email to his son.  He is hoping son continue to finish his masters program and he has only 6 grade remaining.  He reported relationship with his wife is going very well.  Now he stays sometimes with him and they are sleeping together.  Recently had a visit to [facility] with his wife and a good time.  Patient also very attached to his religion and that helps him when he is in crisis.  He is going to [place] in November.  He is in marriage counseling with Bennet Dames and also his son started therapy with him.  He really liked his therapist.  His son also taking Lamictal  from his PCP.  Patient is sleeping good and denies any recent nightmares or flashback other than occasional dream about his father.  Denies any suicidal thoughts or homicidal thoughts.  He denies any hopelessness or worthlessness.  His appetite is okay.  He lost few pounds since the last visit.  He is on Mounjaro .  His last hemoglobin A1c 6.8.  He is also on testosterone  and level is 732.  He has no rash, tremors, shakes or any EPS. Visit Diagnosis:    ICD-10-CM   1. Chronic post-traumatic stress disorder (PTSD)  F43.12 QUEtiapine  (SEROQUEL ) 200 MG tablet    lamoTRIgine  (LAMICTAL ) 200 MG tablet    2. MDD (major depressive disorder), recurrent episode, moderate (HCC)  F33.1 QUEtiapine  (SEROQUEL ) 200 MG tablet    lamoTRIgine  (LAMICTAL ) 200 MG tablet    3. Marital problem  Z63.0 lamoTRIgine   (LAMICTAL ) 200 MG tablet      Past Psychiatric History: Reviewed H/O depression, anxiety and ADHD diagnosed by Dr. Marylin.  Tried Wellbutrin  but did not continued for a long time.  Started medication in 2000 by PCP.  Took Paxil but stopped due to sexual side effects, took Seroquel , Xanax , Zoloft  and Seroquel  from PCP.  History of PTSD. H/O inpatient at [facility] in October 2024 after intentional overdose on Xanax .  He did PHP at [facility] in [place]  and then in [place]  and recently in [place].  Ambien, trazodone  and Zoloft  discontinued in PHP.   Past Medical History:  Past Medical History:  Diagnosis Date   Adjustment disorder with mixed anxiety and depressed mood    Arthritis    Asthma    history   Diabetes mellitus    type 2   Elevated blood pressure    history of high blood pressure readings   History of chicken pox    Hypercholesteremia    Hypertension    Internal hemorrhoids    Tubular adenoma of colon     Past Surgical History:  Procedure Laterality Date   ELBOW SURGERY     right elbow 2010, left elbow 08/06/10 Rodman Schimke)   KNEE SURGERY     right knee new ACL- [phone]   KNEE SURGERY  left knee 2003   NASAL SEPTUM SURGERY     1992   PARTIAL HIP ARTHROPLASTY     right hip replacement    Family Psychiatric History: Reviewed  Family History:  Family History  Problem Relation Age of Onset   Diabetes Mother    Thyroid  disease Mother        Questionable   Heart disease Father        deceased   Other Neg Hx        hypogonadism   Colon cancer Neg Hx     Social History:  Social History   Socioeconomic History   Marital status: Married    Spouse name: Not on file   Number of children: Not on file   Years of education: Not on file   Highest education level: Not on file  Occupational History   Not on file  Tobacco Use   Smoking status: Never   Smokeless tobacco: Never  Substance and Sexual Activity   Alcohol use: Yes    Alcohol/week: 7.0 -  14.0 standard drinks of alcohol    Types: 7 - 14 Glasses of wine per week   Drug use: No   Sexual activity: Not on file  Other Topics Concern   Not on file  Social History Narrative   Occupation: Real Therapist, occupational   Married -15 marriage (2nd marriage)   daughter 46,  2 sons (2nd marriage  11,6)   [place]   Never Smoked    Alcohol use-yes   Drug use-no    Regular exercise-no   Smoking Status:  never   Does Patient Exercise:  no   Caffeine use/day:  3-4 cups coffee daily   Drug Use:  no         Social Drivers of Corporate investment banker Strain: Not on file  Food Insecurity: Low Risk  (11/27/2022)   Received from [facility]   Hunger Vital Sign    Within the past 12 months, you worried that your food would run out before you got money to buy more: Never true    Within the past 12 months, the food you bought just didn't last and you didn't have money to get more. : Never true  Transportation Needs: No Transportation Needs (11/27/2022)   Received from [facility]    In the past 12 months, has lack of reliable transportation kept you from medical appointments, meetings, work or from getting things needed for daily living? : No  Physical Activity: Not on file  Stress: Not on file  Social Connections: Not on file    Allergies: No Known Allergies  Metabolic Disorder Labs: Lab Results  Component Value Date   HGBA1C 6.8 (A) 09/02/2023   MPG 246 07/04/2017   MPG 174 12/09/2016   Lab Results  Component Value Date   PROLACTIN 3.4 09/27/2014   Lab Results  Component Value Date   CHOL 78 06/10/2023   TRIG 173.0 (H) 06/10/2023   HDL 20.90 (L) 06/10/2023   CHOLHDL 4 06/10/2023   VLDL 34.6 06/10/2023   LDLCALC 23 06/10/2023   LDLCALC  11/01/2022     Comment:     . LDL cholesterol not calculated. Triglyceride levels greater than 400 mg/dL invalidate calculated LDL results. . Reference range: <100 . Desirable range <100 mg/dL for primary prevention;    <70 mg/dL for patients with CHD or diabetic patients  with > or = 2 CHD risk factors. SABRA LDL-C is  now calculated using the Martin-Hopkins  calculation, which is a validated novel method providing  better accuracy than the Friedewald equation in the  estimation of LDL-C.  Gladis APPLETHWAITE et al. SANDREA. 7986;689(80): 2061-2068  (http://education.[employer].com/faq/FAQ164)    Lab Results  Component Value Date   TSH 2.89 12/14/2019   TSH 3.91 06/28/2016    Therapeutic Level Labs: No results found for: LITHIUM No results found for: VALPROATE No results found for: CBMZ  Current Medications: Current Outpatient Medications  Medication Sig Dispense Refill   alfuzosin  (UROXATRAL ) 10 MG 24 hr tablet Take 1 tablet (10 mg total) by mouth daily. 90 tablet 3   apixaban  (ELIQUIS ) 5 MG TABS tablet Take 1 tablet (5 mg total) by mouth 2 (two) times daily. 180 tablet 0   carvedilol  (COREG ) 12.5 MG tablet Take 1 tablet (12.5 mg total) by mouth in the morning and 1 tablet (12.5 mg total) in the evening. Take with meals. 180 tablet 1   chlorhexidine  (PERIOGARD ) 0.12 % solution Rinse mouth with 15 mLs (1 capful) for 30 seconds in the morning and evening after tootbrushing. Spit after rinsing, do not swallow. 473 mL 3   Continuous Glucose Sensor (FREESTYLE LIBRE 3 PLUS SENSOR) MISC Use 1 sensor every 15 days as directed. 6 each 3   diclofenac  (VOLTAREN ) 75 MG EC tablet Take 1 tablet (75 mg total) by mouth 2 (two) times daily. 50 tablet 2   Empagliflozin -metFORMIN  HCl (SYNJARDY ) 12.06-998 MG TABS Take 1 tablet by mouth 2 (two) times daily. 180 tablet 3   famotidine  (PEPCID ) 20 MG tablet Take 1 tablet (20 mg total) by mouth 2 (two) times daily. 180 tablet 1   fenofibrate  micronized (LOFIBRA) 200 MG capsule Take 1 capsule (200 mg total) by mouth daily before breakfast. 90 capsule 3   fluticasone  (FLONASE ) 50 MCG/ACT nasal spray Place 2 sprays into both nostrils daily. 16 g 1   hydrocortisone  (ANUSOL -HC)  2.5 % rectal cream Place 1 Application rectally 2 (two) times daily. 30 g 0   Insulin  Glargine (BASAGLAR  KWIKPEN) 100 UNIT/ML Inject 40-45 Units into the skin daily. 45 mL 3   Insulin  Glargine (BASAGLAR  KWIKPEN) 100 UNIT/ML Inject 35 Units into the skin daily. 45 mL 3   insulin  glargine (LANTUS ) 100 UNIT/ML Solostar Pen Inject 45 Units into the skin every night. 45 mL 3   Insulin  Pen Needle 29G X 12.7MM MISC Use as directed for insulin  injection. 100 each 3   ipratropium (ATROVENT ) 0.02 % nebulizer solution Take 2.5 mLs (0.5 mg total) by nebulization 4 (four) times daily. 75 mL 12   ipratropium-albuterol  (DUONEB) 0.5-2.5 (3) MG/3ML SOLN Take 3 mL by nebulization every 6 (six) hours as needed for wheezing. 180 mL 0   lamoTRIgine  (LAMICTAL ) 200 MG tablet Take 1 tablet (200 mg total) by mouth daily. 90 tablet 0   lisinopril  (ZESTRIL ) 10 MG tablet Take 1 tablet (10 mg total) by mouth daily. 90 tablet 1   Melatonin 10 MG TABS Take 10 mg by mouth at bedtime.     QUEtiapine  (SEROQUEL ) 200 MG tablet Take 1 tablet (200 mg total) by mouth at bedtime. 90 tablet 0   Respiratory Therapy Supplies (TUBING/WING TIP) MISC Once daily 1 each 8   rosuvastatin  (CRESTOR ) 40 MG tablet Take 1 tablet (40 mg total) by mouth daily. 90 tablet 3   Sodium Fluoride  (CLINPRO  5000) 1.1 % PSTE Use pea size amount of toothpaste and brush for 2 mins and spit. Do not eat/drink/rinse for 30 mins 100  mL 3   sulfamethoxazole -trimethoprim  (BACTRIM  DS) 800-160 MG tablet Take 1 tablet by mouth every 12 (twelve) hours for 20 days. 40 tablet 0   SYRINGE-NEEDLE, DISP, 3 ML (B-D 3CC LUER-LOK SYR 22GX1) 22G X 1 3 ML MISC Use to inject testosterone  10 each 3   tadalafil  (CIALIS ) 20 MG tablet Take 0.5-1 tablets (10-20 mg total) by mouth daily as needed for erectile dysfunction. 10 tablet 2   testosterone  enanthate (DELATESTRYL ) 200 MG/ML injection Inject 0.5 mLs (100 mg total) into the muscle once a week. 5 mL 5   tirzepatide  (MOUNJARO ) 7.5  MG/0.5ML Pen Inject 7.5 mg into the skin once a week. 6 mL 3   No current facility-administered medications for this visit.     Musculoskeletal: Strength & Muscle Tone: within normal limits Gait & Station: normal Patient leans: N/A  Psychiatric Specialty Exam: Review of Systems  Blood pressure 109/72, pulse 86, height 6' (1.829 m), weight 200 lb (90.7 kg).Body mass index is 27.12 kg/m.  General Appearance: Casual  Eye Contact:  Good  Speech:  Normal Rate  Volume:  Normal  Mood:  Dysphoric  Affect:  Congruent  Thought Process:  Goal Directed  Orientation:  Full (Time, Place, and Person)  Thought Content: Rumination   Suicidal Thoughts:  No  Homicidal Thoughts:  No  Memory:  Immediate;   Good Recent;   Good Remote;   Good  Judgement:  Good  Insight:  Present  Psychomotor Activity:  Normal  Concentration:  Concentration: Good and Attention Span: Good  Recall:  Good  Fund of Knowledge: Good  Language: Good  Akathisia:  No  Handed:  Right  AIMS (if indicated): not done  Assets:  Communication Skills Desire for Improvement Housing Talents/Skills Transportation  ADL's:  Intact  Cognition: WNL  Sleep:  Good   Screenings: GAD-7    Flowsheet Row Office Visit from 02/12/2023 in [facility] at [facility]  Total GAD-7 Score 0   PHQ2-9    Flowsheet Row Office Visit from 06/10/2023 in [facility] at [facility] Office Visit from 02/12/2023 in [facility] at [facility] Office Visit from 08/31/2021 in [facility] at [facility] Office Visit from 07/21/2020 in [facility] at [facility] Video Visit from 12/06/2019 in [facility] Total Score 0 0 6 0 1  PHQ-9 Total Score 0 0 14 -- 1     Assessment and Plan: Patient is 63 year old Caucasian married man with a history of hypertension,  diabetes, cervical radiculopathy, hyperlipidemia, hypergonadism, PTSD, marital stress and major depressive disorder.  I reviewed collateral information from other providers, medication and blood work results.  Hemoglobin A1c improved to 6.8 and testosterone  level normal.  He is on Mounjaro .  He reported his mood is stable however occasionally get upset with the sun.  He feels better after writing an apology email to his son.  His marriage is also going very well and he is in therapy.  We talk about the medication and so far he feels the medicine is working but occasionally he takes extra Lamictal  when he get upset.  I will continue Seroquel  200 mg at bedtime, lamotrigine  200 mg daily.  We have increased the dose on the last visit and he feels much better since then.  Discussed occasional and episodic irritability and recommend to use coping skills.  He  is going to [place] in November and like to have a visit before that.  Will follow-up in 2 months.  I recommend to call back with any question or any concern.  Encourage watching his calorie intake.  Collaboration of Care: Collaboration of Care: Other provider involved in patient's care AEB notes are available in epic to review  Patient/Guardian was advised Release of Information must be obtained prior to any record release in order to collaborate their care with an outside provider. Patient/Guardian was advised if they have not already done so to contact the registration department to sign all necessary forms in order for us  to release information regarding their care.   Consent: Patient/Guardian gives verbal consent for treatment and assignment of benefits for services provided during this visit. Patient/Guardian expressed understanding and agreed to proceed.    Leni ONEIDA Client, MD 09/11/2023, 10:42 AM

## 2023-09-12 ENCOUNTER — Other Ambulatory Visit (HOSPITAL_COMMUNITY): Payer: Self-pay

## 2023-09-12 NOTE — Telephone Encounter (Signed)
 Was this PA for the 5mg  dose or the 7.5mg  dose. If not for the 7.5mg  dose,we will need PA

## 2023-09-14 DIAGNOSIS — F4322 Adjustment disorder with anxiety: Secondary | ICD-10-CM | POA: Diagnosis not present

## 2023-09-17 ENCOUNTER — Ambulatory Visit: Admitting: Urology

## 2023-09-17 ENCOUNTER — Other Ambulatory Visit (HOSPITAL_BASED_OUTPATIENT_CLINIC_OR_DEPARTMENT_OTHER): Payer: Self-pay

## 2023-09-17 ENCOUNTER — Encounter: Payer: Self-pay | Admitting: Urology

## 2023-09-17 VITALS — BP 102/69 | HR 81 | Ht 72.0 in | Wt 200.0 lb

## 2023-09-17 DIAGNOSIS — R300 Dysuria: Secondary | ICD-10-CM | POA: Diagnosis not present

## 2023-09-17 DIAGNOSIS — R399 Unspecified symptoms and signs involving the genitourinary system: Secondary | ICD-10-CM

## 2023-09-17 DIAGNOSIS — Z87440 Personal history of urinary (tract) infections: Secondary | ICD-10-CM | POA: Diagnosis not present

## 2023-09-17 DIAGNOSIS — F4322 Adjustment disorder with anxiety: Secondary | ICD-10-CM | POA: Diagnosis not present

## 2023-09-17 DIAGNOSIS — R3915 Urgency of urination: Secondary | ICD-10-CM | POA: Diagnosis not present

## 2023-09-17 DIAGNOSIS — N529 Male erectile dysfunction, unspecified: Secondary | ICD-10-CM

## 2023-09-17 DIAGNOSIS — N419 Inflammatory disease of prostate, unspecified: Secondary | ICD-10-CM

## 2023-09-17 DIAGNOSIS — R350 Frequency of micturition: Secondary | ICD-10-CM

## 2023-09-17 DIAGNOSIS — E291 Testicular hypofunction: Secondary | ICD-10-CM

## 2023-09-17 DIAGNOSIS — R3911 Hesitancy of micturition: Secondary | ICD-10-CM

## 2023-09-17 LAB — URINALYSIS, ROUTINE W REFLEX MICROSCOPIC
Bilirubin, UA: NEGATIVE
Ketones, UA: NEGATIVE
Leukocytes,UA: NEGATIVE
Nitrite, UA: NEGATIVE
Protein,UA: NEGATIVE
RBC, UA: NEGATIVE
Specific Gravity, UA: 1.02 (ref 1.005–1.030)
Urobilinogen, Ur: 0.2 mg/dL (ref 0.2–1.0)
pH, UA: 5.5 (ref 5.0–7.5)

## 2023-09-17 LAB — MICROSCOPIC EXAMINATION: Bacteria, UA: NONE SEEN

## 2023-09-17 LAB — BLADDER SCAN AMB NON-IMAGING

## 2023-09-17 MED ORDER — SULFAMETHOXAZOLE-TRIMETHOPRIM 800-160 MG PO TABS
1.0000 | ORAL_TABLET | Freq: Two times a day (BID) | ORAL | 0 refills | Status: AC
  Filled 2023-09-17 (×2): qty 42, 21d supply, fill #0

## 2023-09-17 NOTE — Progress Notes (Signed)
 Assessment: 1. Lower urinary tract symptoms   2. Prostatitis, unspecified prostatitis type   3. Organic impotence   4. Hypogonadism in male   5. History of UTI     Plan: His urinalysis today does not show evidence of a UTI. His symptoms are likely due to continued prostatitis. Recommend a another 3-week course of Bactrim  DS twice daily.  Prescription sent. Continue alfuzosin  10 mg daily. Information on penile injections provided to the patient.  I did advise him that given his use of Eliquis , he is at increased risk of bleeding complications.  He will consider this option and let me know if he wishes to proceed.  I did discuss with him the process of ordering the medication from a compounding pharmacy and doing a trial injection here in the office prior to him beginning treatment.  Return to office in 1 month   Chief Complaint:  Chief Complaint  Patient presents with   LUTS    History of Present Illness:  William [name] is a 63 y.o. male who is seen for evaluation of lower urinary tract symptoms, prostatitis, history of rising PSA, hypogonadism, and erectile dysfunction.  He was previously seen in 12/23 for a rising PSA.  PSA results: 5/19 0.3 9/19 0.38 2/20 0.36 7/20 0.37 5/21 0.26 11/21 0.67 3/22 0.28 5/23 0.24 9/23 3.21 9/24 0.54 5/25 0.49  No history of elevated PSA.  No history of UTIs or prostatitis.  No family history of prostate cancer.  He has been in on testosterone  replacement therapy with testosterone  injections 50 mg every 2 weeks.   Testosterone  level from 9/23: 617 He has been on testosterone  replacement therapy for approximately 10 years.  He initially used topical gel without significant benefit.  He has been receiving short acting injections for approximately 6 years.  He is currently being followed by Dr. Sam with endocrinology.  He was using 50 mg weekly. Testosterone  level from 5/25: 120 Testosterone  level from 7/25: 732 His dose was  recently changed to 100 mg weekly.  He has lower urinary tract symptoms including frequency, urgency, nocturia x 2, decreased stream, intermittent stream, and sensation of incomplete emptying.  He has also noted occasional dysuria.  No gross hematuria. IPSS = 25. He did not wish to pursue any medical treatment at the time of his visit in December 2023.  He was seen in July 2025 for further evaluation of increased lower urinary tract symptoms and erectile dysfunction. He reported worsening lower urinary tract symptoms including urgency, intermittent stream, weak stream, frequency, and sensation of incomplete emptying.  He has nocturia x 1.  He also has slight dysuria with voiding.  No gross hematuria or flank pain.  He had been on tamsulosin  0.8 mg for at least 6 months but has not seen a significant change in his symptoms. IPSS = 17/5. PVR = 154 ml. He was started on alfuzosin  10 mg daily in place of tamsulosin .  He was diagnosed with a UTI in July 2025. Urine culture from 08/05/2023 grew 50-100 K Klebsiella. He was initially treated with cephalexin  and changed to Macrobid . Urine culture from 08/21/2023 grew >100 K Klebsiella.  He was treated with Bactrim  x 20 days for UTI with prostatitis.  He continued on testosterone  injections 50 mg every week.   He continues to have problems with erectile dysfunction.  He reports inability to achieve an adequate erection for intercourse.  He reports approximately 20% rigidity.  No pain or curvature with erection.  No  nocturnal or early morning erections.  He had tried tadalafil  and sildenafil  without benefit. Additional treatment options discussed with the patient at his visit in July 2025.  He was given information on VED.  He presents today for evaluation of ongoing lower urinary tract symptoms.  He is concerned that his UTI has not cleared.  He continues to have frequency, urgency, dysuria, intermittent stream, hesitancy.  No gross hematuria.  He completed  the Bactrim  on 09/11/2023.  He did notice some improvement in his symptoms while he was on the antibiotic.  Since discontinuing the antibiotic he has noted continued symptoms however.  He is taking alfuzosin  10 mg daily. IPSS = 23/6.  He is interested in options for treatment of his ED.  Specifically, he is requesting information on penile injections.   Portions of the above documentation were copied from a prior visit for review purposes only.   Past Medical History:  Past Medical History:  Diagnosis Date   Adjustment disorder with mixed anxiety and depressed mood    Arthritis    Asthma    history   Diabetes mellitus    type 2   Elevated blood pressure    history of high blood pressure readings   History of chicken pox    Hypercholesteremia    Hypertension    Internal hemorrhoids    Tubular adenoma of colon     Past Surgical History:  Past Surgical History:  Procedure Laterality Date   ELBOW SURGERY     right elbow 2010, left elbow 08/06/10 Rodman Schimke)   KNEE SURGERY     right knee new ACL- [phone]   KNEE SURGERY     left knee 2003   NASAL SEPTUM SURGERY     1992   PARTIAL HIP ARTHROPLASTY     right hip replacement    Allergies:  No Known Allergies  Family History:  Family History  Problem Relation Age of Onset   Diabetes Mother    Thyroid  disease Mother        Questionable   Heart disease Father        deceased   Other Neg Hx        hypogonadism   Colon cancer Neg Hx     Social History:  Social History   Tobacco Use   Smoking status: Never   Smokeless tobacco: Never  Substance Use Topics   Alcohol use: Yes    Alcohol/week: 7.0 - 14.0 standard drinks of alcohol    Types: 7 - 14 Glasses of wine per week   Drug use: No    ROS: Constitutional:  Negative for fever, chills, weight loss CV: Negative for chest pain, previous MI, hypertension Respiratory:  Negative for shortness of breath, wheezing, sleep apnea, frequent cough GI:  Negative for  nausea, vomiting, bloody stool, GERD  Physical exam: BP 102/69   Pulse 81   Ht 6' (1.829 m)   Wt 200 lb (90.7 kg)   BMI 27.12 kg/m  GENERAL APPEARANCE:  Well appearing, well developed, well nourished, NAD HEENT:  Atraumatic, normocephalic, oropharynx clear NECK:  Supple without lymphadenopathy or thyromegaly ABDOMEN:  Soft, non-tender, no masses EXTREMITIES:  Moves all extremities well, without clubbing, cyanosis, or edema NEUROLOGIC:  Alert and oriented x 3, normal gait, CN II-XII grossly intact MENTAL STATUS:  appropriate BACK:  Non-tender to palpation, No CVAT SKIN:  Warm, dry, and intact   Results: U/A: 0-5 WBCs, 0-2 RBCs  PVR = 166 ml

## 2023-09-24 DIAGNOSIS — F4322 Adjustment disorder with anxiety: Secondary | ICD-10-CM | POA: Diagnosis not present

## 2023-09-29 ENCOUNTER — Other Ambulatory Visit (HOSPITAL_BASED_OUTPATIENT_CLINIC_OR_DEPARTMENT_OTHER): Payer: Self-pay

## 2023-09-29 ENCOUNTER — Encounter (HOSPITAL_COMMUNITY): Payer: Self-pay

## 2023-09-30 ENCOUNTER — Other Ambulatory Visit (HOSPITAL_BASED_OUTPATIENT_CLINIC_OR_DEPARTMENT_OTHER): Payer: Self-pay

## 2023-09-30 NOTE — Telephone Encounter (Signed)
 He need to check his testosterone  level and also increase the frequency of his therapy sessions.  Not sure if the family concern is increasing the depression.

## 2023-10-01 DIAGNOSIS — F4322 Adjustment disorder with anxiety: Secondary | ICD-10-CM | POA: Diagnosis not present

## 2023-10-02 ENCOUNTER — Ambulatory Visit: Admitting: Urology

## 2023-10-08 DIAGNOSIS — F4322 Adjustment disorder with anxiety: Secondary | ICD-10-CM | POA: Diagnosis not present

## 2023-10-10 ENCOUNTER — Other Ambulatory Visit (HOSPITAL_BASED_OUTPATIENT_CLINIC_OR_DEPARTMENT_OTHER): Payer: Self-pay

## 2023-10-14 ENCOUNTER — Inpatient Hospital Stay: Payer: Federal, State, Local not specified - PPO | Admitting: Medical Oncology

## 2023-10-14 ENCOUNTER — Other Ambulatory Visit: Payer: Self-pay | Admitting: Medical Oncology

## 2023-10-14 ENCOUNTER — Inpatient Hospital Stay: Payer: Federal, State, Local not specified - PPO | Attending: Medical Oncology

## 2023-10-14 DIAGNOSIS — Z86711 Personal history of pulmonary embolism: Secondary | ICD-10-CM | POA: Insufficient documentation

## 2023-10-14 DIAGNOSIS — I2699 Other pulmonary embolism without acute cor pulmonale: Secondary | ICD-10-CM

## 2023-10-14 DIAGNOSIS — Z7901 Long term (current) use of anticoagulants: Secondary | ICD-10-CM | POA: Insufficient documentation

## 2023-10-14 DIAGNOSIS — D6862 Lupus anticoagulant syndrome: Secondary | ICD-10-CM | POA: Insufficient documentation

## 2023-10-15 DIAGNOSIS — F4322 Adjustment disorder with anxiety: Secondary | ICD-10-CM | POA: Diagnosis not present

## 2023-10-20 ENCOUNTER — Inpatient Hospital Stay (HOSPITAL_BASED_OUTPATIENT_CLINIC_OR_DEPARTMENT_OTHER): Admitting: Medical Oncology

## 2023-10-20 ENCOUNTER — Inpatient Hospital Stay

## 2023-10-20 ENCOUNTER — Encounter: Payer: Self-pay | Admitting: Medical Oncology

## 2023-10-20 VITALS — BP 121/65 | HR 89 | Temp 98.6°F | Resp 18 | Ht 72.0 in | Wt 203.0 lb

## 2023-10-20 DIAGNOSIS — I2699 Other pulmonary embolism without acute cor pulmonale: Secondary | ICD-10-CM | POA: Diagnosis not present

## 2023-10-20 DIAGNOSIS — Z86711 Personal history of pulmonary embolism: Secondary | ICD-10-CM | POA: Diagnosis not present

## 2023-10-20 DIAGNOSIS — Z7901 Long term (current) use of anticoagulants: Secondary | ICD-10-CM | POA: Diagnosis not present

## 2023-10-20 DIAGNOSIS — D6862 Lupus anticoagulant syndrome: Secondary | ICD-10-CM | POA: Diagnosis not present

## 2023-10-20 LAB — CBC
HCT: 36 % — ABNORMAL LOW (ref 39.0–52.0)
Hemoglobin: 12.2 g/dL — ABNORMAL LOW (ref 13.0–17.0)
MCH: 30.3 pg (ref 26.0–34.0)
MCHC: 33.9 g/dL (ref 30.0–36.0)
MCV: 89.6 fL (ref 80.0–100.0)
Platelets: 163 K/uL (ref 150–400)
RBC: 4.02 MIL/uL — ABNORMAL LOW (ref 4.22–5.81)
RDW: 14 % (ref 11.5–15.5)
WBC: 6.4 K/uL (ref 4.0–10.5)
nRBC: 0 % (ref 0.0–0.2)

## 2023-10-20 LAB — CMP (CANCER CENTER ONLY)
ALT: 23 U/L (ref 0–44)
AST: 24 U/L (ref 15–41)
Albumin: 4.4 g/dL (ref 3.5–5.0)
Alkaline Phosphatase: 51 U/L (ref 38–126)
Anion gap: 12 (ref 5–15)
BUN: 14 mg/dL (ref 8–23)
CO2: 23 mmol/L (ref 22–32)
Calcium: 9 mg/dL (ref 8.9–10.3)
Chloride: 105 mmol/L (ref 98–111)
Creatinine: 1.13 mg/dL (ref 0.61–1.24)
GFR, Estimated: >60 mL/min (ref >60)
Glucose, Bld: 173 mg/dL — ABNORMAL HIGH (ref 70–99)
Potassium: 4.6 mmol/L (ref 3.5–5.1)
Sodium: 141 mmol/L (ref 135–145)
Total Bilirubin: 0.3 mg/dL (ref 0.0–1.2)
Total Protein: 6.4 g/dL — ABNORMAL LOW (ref 6.5–8.1)

## 2023-10-20 LAB — ANTITHROMBIN III: AntiThromb III Func: 109 % (ref 75–120)

## 2023-10-20 NOTE — Progress Notes (Signed)
 Hematology and Oncology Follow Up Visit  CORDELLE DAHMEN [id number] 12/07/1960 63 y.o. 10/20/2023  Past Medical History:  Diagnosis Date   Adjustment disorder with mixed anxiety and depressed mood    Arthritis    Asthma    history   Diabetes mellitus    type 2   Elevated blood pressure    history of high blood pressure readings   History of chicken pox    Hypercholesteremia    Hypertension    Internal hemorrhoids    Tubular adenoma of colon     Principle Diagnosis:  Pulmonary Embolism- 11/29/2023-Provoked   Current Therapy:   Eliquis  5 mg BID    Interim History:  Mr. Buehl is back for follow-up for his Pulmonary Embolism:     He was diagnosed with a pulmonary embolism on 11/29/2023 at [facility]. Prior to that he had been inpatient from 11/18/2022-12/06/2022 for altered mental status following a benzodiazepine overdose. During this stay he had an event of acute hypoxic respiratory failure and was found to have a PE without acute cor pulmonale. He was started on Eliquis  10 mg BID x 7 days then 5mg  BID. He had a repeat CTA on 12/11/2022 which showed resolution of the PE. He has had CBC/CMP since which were unremarkable.    He reports that he has been taking his Eliquis  as directed. He denies any side effects to this medication.    There has been no bleeding to his knowledge: denies epistaxis, gingivitis, hemoptysis, hematemesis, hematuria, melena, excessive bruising, blood donation.    In terms of additional risk factors, He is not a smoker and fortunately has no history of cancer. He is however currently taking testosterone  supplementation. This is managed by Dr. Sam with endocrinology.    No clotting diseases in the family No history of MI,stroke,PE/DVT prior. No chest pain, new SOB, calf pain, new peripheral edema.   Wt Readings from Last 3 Encounters:  10/20/23 203 lb (92.1 kg)  09/17/23 200 lb (90.7 kg)  09/02/23 200 lb (90.7 kg)     Medications:   Current  Outpatient Medications:    alfuzosin  (UROXATRAL ) 10 MG 24 hr tablet, Take 1 tablet (10 mg total) by mouth daily., Disp: 90 tablet, Rfl: 3   apixaban  (ELIQUIS ) 5 MG TABS tablet, Take 1 tablet (5 mg total) by mouth 2 (two) times daily., Disp: 180 tablet, Rfl: 0   carvedilol  (COREG ) 12.5 MG tablet, Take 1 tablet (12.5 mg total) by mouth in the morning and 1 tablet (12.5 mg total) in the evening. Take with meals., Disp: 180 tablet, Rfl: 1   chlorhexidine  (PERIOGARD ) 0.12 % solution, Rinse mouth with 15 mLs (1 capful) for 30 seconds in the morning and evening after tootbrushing. Spit after rinsing, do not swallow., Disp: 473 mL, Rfl: 3   Continuous Glucose Sensor (FREESTYLE LIBRE 3 PLUS SENSOR) MISC, Use 1 sensor every 15 days as directed., Disp: 6 each, Rfl: 3   diclofenac  (VOLTAREN ) 75 MG EC tablet, Take 1 tablet (75 mg total) by mouth 2 (two) times daily., Disp: 50 tablet, Rfl: 2   Empagliflozin -metFORMIN  HCl (SYNJARDY ) 12.06-998 MG TABS, Take 1 tablet by mouth 2 (two) times daily., Disp: 180 tablet, Rfl: 3   famotidine  (PEPCID ) 20 MG tablet, Take 1 tablet (20 mg total) by mouth 2 (two) times daily., Disp: 180 tablet, Rfl: 1   fenofibrate  micronized (LOFIBRA) 200 MG capsule, Take 1 capsule (200 mg total) by mouth daily before breakfast., Disp: 90 capsule, Rfl: 3  fluticasone  (FLONASE ) 50 MCG/ACT nasal spray, Place 2 sprays into both nostrils daily., Disp: 16 g, Rfl: 1   hydrocortisone  (ANUSOL -HC) 2.5 % rectal cream, Place 1 Application rectally 2 (two) times daily., Disp: 30 g, Rfl: 0   Insulin  Glargine (BASAGLAR  KWIKPEN) 100 UNIT/ML, Inject 40-45 Units into the skin daily., Disp: 45 mL, Rfl: 3   Insulin  Glargine (BASAGLAR  KWIKPEN) 100 UNIT/ML, Inject 35 Units into the skin daily., Disp: 45 mL, Rfl: 3   insulin  glargine (LANTUS ) 100 UNIT/ML Solostar Pen, Inject 45 Units into the skin every night., Disp: 45 mL, Rfl: 3   Insulin  Pen Needle 29G X 12.7MM MISC, Use as directed for insulin  injection., Disp:  100 each, Rfl: 3   ipratropium (ATROVENT ) 0.02 % nebulizer solution, Take 2.5 mLs (0.5 mg total) by nebulization 4 (four) times daily., Disp: 75 mL, Rfl: 12   ipratropium-albuterol  (DUONEB) 0.5-2.5 (3) MG/3ML SOLN, Take 3 mL by nebulization every 6 (six) hours as needed for wheezing., Disp: 180 mL, Rfl: 0   lamoTRIgine  (LAMICTAL ) 200 MG tablet, Take 1 tablet (200 mg total) by mouth daily., Disp: 90 tablet, Rfl: 0   lisinopril  (ZESTRIL ) 10 MG tablet, Take 1 tablet (10 mg total) by mouth daily., Disp: 90 tablet, Rfl: 1   Melatonin 10 MG TABS, Take 10 mg by mouth at bedtime., Disp: , Rfl:    QUEtiapine  (SEROQUEL ) 200 MG tablet, Take 1 tablet (200 mg total) by mouth at bedtime., Disp: 90 tablet, Rfl: 0   Respiratory Therapy Supplies (TUBING/WING TIP) MISC, Once daily, Disp: 1 each, Rfl: 8   rosuvastatin  (CRESTOR ) 40 MG tablet, Take 1 tablet (40 mg total) by mouth daily., Disp: 90 tablet, Rfl: 3   Sodium Fluoride  (CLINPRO  5000) 1.1 % PSTE, Use pea size amount of toothpaste and brush for 2 mins and spit. Do not eat/drink/rinse for 30 mins, Disp: 100 mL, Rfl: 3   SYRINGE-NEEDLE, DISP, 3 ML (B-D 3CC LUER-LOK SYR 22GX1) 22G X 1 3 ML MISC, Use to inject testosterone , Disp: 10 each, Rfl: 3   tadalafil  (CIALIS ) 20 MG tablet, Take 0.5-1 tablets (10-20 mg total) by mouth daily as needed for erectile dysfunction., Disp: 10 tablet, Rfl: 2   testosterone  enanthate (DELATESTRYL ) 200 MG/ML injection, Inject 0.5 mLs (100 mg total) into the muscle once a week., Disp: 5 mL, Rfl: 5   tirzepatide  (MOUNJARO ) 7.5 MG/0.5ML Pen, Inject 7.5 mg into the skin once a week., Disp: 6 mL, Rfl: 3  Allergies: No Known Allergies  Past Medical History, Surgical history, Social history, and Family History were reviewed and updated.  Review of Systems: Review of Systems  Constitutional: Negative.   HENT:  Negative.    Eyes: Negative.   Respiratory: Negative.    Cardiovascular: Negative.   Gastrointestinal: Negative.    Endocrine: Negative.   Genitourinary: Negative.    Musculoskeletal: Negative.   Skin: Negative.   Neurological: Negative.   Hematological: Negative.   Psychiatric/Behavioral: Negative.       Physical Exam:  height is 6' (1.829 m) and weight is 203 lb (92.1 kg). His oral temperature is 98.6 F (37 C). His blood pressure is 121/65 and his pulse is 89. His respiration is 18 and oxygen saturation is 100%.   Physical Exam General: NAD Cardiovascular: regular rate and rhythm Pulmonary: clear ant fields Abdomen: soft, nontender, + bowel sounds GU: no suprapubic tenderness Extremities: no edema, no joint deformities Skin: no rashes Neurological: Weakness but otherwise nonfocal   Lab Results  Component Value Date  WBC 6.4 10/20/2023   HGB 12.2 (L) 10/20/2023   HCT 36.0 (L) 10/20/2023   MCV 89.6 10/20/2023   PLT 163 10/20/2023     Chemistry      Component Value Date/Time   NA 141 06/10/2023 1102   K 4.5 06/10/2023 1102   CL 106 06/10/2023 1102   CO2 26 06/10/2023 1102   BUN 20 06/10/2023 1102   CREATININE 1.10 06/10/2023 1102   CREATININE 0.92 11/01/2022 1440      Component Value Date/Time   CALCIUM  9.4 06/10/2023 1102   ALKPHOS 48 06/10/2023 1102   AST 23 06/10/2023 1102   ALT 28 06/10/2023 1102   BILITOT 0.4 06/10/2023 1102     Encounter Diagnosis  Name Primary?   Acute pulmonary embolism without acute cor pulmonale, unspecified pulmonary embolism type (HCC) Yes   Assessment and Plan- Patient is a 63 y.o. male who was referred to us  for his recent pulmonary embolism:   He was diagnosed with a pulmonary embolism on 11/29/2023 at [facility]. Prior to that he had been inpatient from 11/18/2022-12/06/2022 for altered mental status following a benzodiazepine overdose. During this stay he had an event of acute hypoxic respiratory failure and was found to have a PE without acute cor pulmonale. He was started on Eliquis  10 mg BID x 7 days then 5mg  BID. He had a repeat CTA on  12/11/2022 which showed resolution of the PE. He has had CBC/CMP since which were unremarkable.   At this time he will have completed his Eliquis  treatment course at the end of October, 2025.He elects hypercoagulable work up at this time.  At this time he elects to stop this medication after this date if hypercoagulable panel is negative. I have suggested that he take a full strength aspirin once daily given his testosterone  use. Should symptoms or signs of PE/DV reoccur he will need to seek immediate medical attention.   Disposition: RTC 1 year APP, no labs    Lauraine Dais PA-C 9/15/202511:40 AM

## 2023-10-21 ENCOUNTER — Ambulatory Visit: Payer: Self-pay | Admitting: Medical Oncology

## 2023-10-21 ENCOUNTER — Encounter: Payer: Self-pay | Admitting: Urology

## 2023-10-21 ENCOUNTER — Ambulatory Visit (INDEPENDENT_AMBULATORY_CARE_PROVIDER_SITE_OTHER): Admitting: Urology

## 2023-10-21 VITALS — BP 100/65 | HR 91 | Ht 72.0 in | Wt 201.0 lb

## 2023-10-21 DIAGNOSIS — N419 Inflammatory disease of prostate, unspecified: Secondary | ICD-10-CM

## 2023-10-21 DIAGNOSIS — N529 Male erectile dysfunction, unspecified: Secondary | ICD-10-CM | POA: Diagnosis not present

## 2023-10-21 DIAGNOSIS — R399 Unspecified symptoms and signs involving the genitourinary system: Secondary | ICD-10-CM | POA: Diagnosis not present

## 2023-10-21 LAB — HOMOCYSTEINE: Homocysteine: 13.4 umol/L (ref 0.0–17.2)

## 2023-10-21 LAB — URINALYSIS, ROUTINE W REFLEX MICROSCOPIC
Bilirubin, UA: NEGATIVE
Ketones, UA: NEGATIVE
Leukocytes,UA: NEGATIVE
Nitrite, UA: NEGATIVE
Protein,UA: NEGATIVE
RBC, UA: NEGATIVE
Specific Gravity, UA: 1.015 (ref 1.005–1.030)
Urobilinogen, Ur: 0.2 mg/dL (ref 0.2–1.0)
pH, UA: 6 (ref 5.0–7.5)

## 2023-10-21 LAB — MICROSCOPIC EXAMINATION

## 2023-10-21 NOTE — Progress Notes (Signed)
 Assessment: 1. Prostatitis, unspecified prostatitis type   2. Lower urinary tract symptoms   3. Organic impotence     Plan: He is not interested in any treatment for his erectile dysfunction at this time. Return to office in 6 months.  Chief Complaint:  Chief Complaint  Patient presents with   Prostatitis    History of Present Illness:  William [name] is a 63 y.o. male who is seen for evaluation of lower urinary tract symptoms, prostatitis, history of rising PSA, hypogonadism, and erectile dysfunction.  He was previously seen in 12/23 for a rising PSA.  PSA results: 5/19 0.3 9/19 0.38 2/20 0.36 7/20 0.37 5/21 0.26 11/21 0.67 3/22 0.28 5/23 0.24 9/23 3.21 9/24 0.54 5/25 0.49  No history of elevated PSA.  No history of UTIs or prostatitis.  No family history of prostate cancer.  He has been in on testosterone  replacement therapy with testosterone  injections 50 mg every 2 weeks.   Testosterone  level from 9/23: 617 He has been on testosterone  replacement therapy for approximately 10 years.  He initially used topical gel without significant benefit.  He has been receiving short acting injections for approximately 6 years.  He is currently being followed by Dr. Sam with endocrinology.  He was using 50 mg weekly. Testosterone  level from 5/25: 120 Testosterone  level from 7/25: 732 His dose was recently changed to 100 mg weekly.  He has lower urinary tract symptoms including frequency, urgency, nocturia x 2, decreased stream, intermittent stream, and sensation of incomplete emptying.  He has also noted occasional dysuria.  No gross hematuria. IPSS = 25. He did not wish to pursue any medical treatment at the time of his visit in December 2023.  He was seen in July 2025 for further evaluation of increased lower urinary tract symptoms and erectile dysfunction. He reported worsening lower urinary tract symptoms including urgency, intermittent stream, weak stream,  frequency, and sensation of incomplete emptying.  He has nocturia x 1.  He also has slight dysuria with voiding.  No gross hematuria or flank pain.  He had been on tamsulosin  0.8 mg for at least 6 months but had not seen a significant change in his symptoms. IPSS = 17/5. PVR = 154 ml. He was started on alfuzosin  10 mg daily in place of tamsulosin .  He was diagnosed with a UTI in July 2025. Urine culture from 08/05/2023 grew 50-100 K Klebsiella. He was initially treated with cephalexin  and changed to Macrobid . Urine culture from 08/21/2023 grew >100 K Klebsiella.  He was treated with Bactrim  x 20 days for UTI with prostatitis.  He continued on testosterone  injections 50 mg every week.   He continues to have problems with erectile dysfunction.  He reports inability to achieve an adequate erection for intercourse.  He reports approximately 20% rigidity.  No pain or curvature with erection.  No nocturnal or early morning erections.  He had tried tadalafil  and sildenafil  without benefit. Additional treatment options discussed with the patient at his visit in July 2025.  He was given information on VED.  He was seen in August 2025 for evaluation of ongoing lower urinary tract symptoms.  He was concerned that his UTI had not cleared.  He continued to have frequency, urgency, dysuria, intermittent stream, hesitancy.  No gross hematuria.  He had completed the Bactrim  on 09/11/2023.  He did notice some improvement in his symptoms while he was on the antibiotic.  Since discontinuing the antibiotic he had noted continued symptoms.  He was taking alfuzosin  10 mg daily. IPSS = 23/6. PVR = 166 ml She was treated for an additional 3 weeks with Bactrim  DS for prostatitis.  He was continued on alfuzosin . He was given information on penile injections for management of his erectile dysfunction.  He was today for follow-up.  He is completing the last round of Bactrim .  He has noted improvement in his urinary symptoms.  He  does have rare dysuria.  He also has occasional incontinence.  No gross hematuria or flank pain. IPSS = 23/3.  Portions of the above documentation were copied from a prior visit for review purposes only.   Past Medical History:  Past Medical History:  Diagnosis Date   Adjustment disorder with mixed anxiety and depressed mood    Arthritis    Asthma    history   Diabetes mellitus    type 2   Elevated blood pressure    history of high blood pressure readings   History of chicken pox    Hypercholesteremia    Hypertension    Internal hemorrhoids    Tubular adenoma of colon     Past Surgical History:  Past Surgical History:  Procedure Laterality Date   ELBOW SURGERY     right elbow 2010, left elbow 08/06/10 Rodman Schimke)   KNEE SURGERY     right knee new ACL- [phone]   KNEE SURGERY     left knee 2003   NASAL SEPTUM SURGERY     1992   PARTIAL HIP ARTHROPLASTY     right hip replacement    Allergies:  No Known Allergies  Family History:  Family History  Problem Relation Age of Onset   Diabetes Mother    Thyroid  disease Mother        Questionable   Heart disease Father        deceased   Other Neg Hx        hypogonadism   Colon cancer Neg Hx     Social History:  Social History   Tobacco Use   Smoking status: Never   Smokeless tobacco: Never  Vaping Use   Vaping status: Never Used  Substance Use Topics   Alcohol use: Yes    Alcohol/week: 7.0 - 14.0 standard drinks of alcohol    Types: 7 - 14 Glasses of wine per week   Drug use: No    ROS: Constitutional:  Negative for fever, chills, weight loss CV: Negative for chest pain, previous MI, hypertension Respiratory:  Negative for shortness of breath, wheezing, sleep apnea, frequent cough GI:  Negative for nausea, vomiting, bloody stool, GERD  Physical exam: BP 100/65   Pulse 91   Ht 6' (1.829 m)   Wt 201 lb (91.2 kg)   BMI 27.26 kg/m  GENERAL APPEARANCE:  Well appearing, well developed, well  nourished, NAD HEENT:  Atraumatic, normocephalic, oropharynx clear NECK:  Supple without lymphadenopathy or thyromegaly ABDOMEN:  Soft, non-tender, no masses EXTREMITIES:  Moves all extremities well, without clubbing, cyanosis, or edema NEUROLOGIC:  Alert and oriented x 3, normal gait, CN II-XII grossly intact MENTAL STATUS:  appropriate BACK:  Non-tender to palpation, No CVAT SKIN:  Warm, dry, and intact  Results: U/A: 0-5 WBCs, 0-2 RBCs

## 2023-10-22 DIAGNOSIS — F4322 Adjustment disorder with anxiety: Secondary | ICD-10-CM | POA: Diagnosis not present

## 2023-10-22 LAB — BETA-2-GLYCOPROTEIN I ABS, IGG/M/A
Beta-2 Glyco I IgG: <9 GPI IgG units (ref 0–20)
Beta-2-Glycoprotein I IgA: <9 GPI IgA units (ref 0–25)
Beta-2-Glycoprotein I IgM: <9 GPI IgM units (ref 0–32)

## 2023-10-22 LAB — DRVVT CONFIRM: dRVVT Confirm: 1.8 ratio — ABNORMAL HIGH (ref 0.8–1.2)

## 2023-10-22 LAB — LUPUS ANTICOAGULANT PANEL
DRVVT: 107.2 s — ABNORMAL HIGH (ref 0.0–47.0)
PTT Lupus Anticoagulant: 45.3 s — ABNORMAL HIGH (ref 0.0–43.5)

## 2023-10-22 LAB — HEXAGONAL PHASE PHOSPHOLIPID: Hexagonal Phase Phospholipid: 10 s (ref 0–11)

## 2023-10-22 LAB — PTT-LA MIX: PTT-LA Mix: 43.3 s — ABNORMAL HIGH (ref 0.0–40.5)

## 2023-10-22 LAB — PROTEIN S ACTIVITY: Protein S Activity: 94 % (ref 63–140)

## 2023-10-22 LAB — PROTEIN S, TOTAL: Protein S Ag, Total: 92 % (ref 60–150)

## 2023-10-22 LAB — DRVVT MIX: dRVVT Mix: 63.8 s — ABNORMAL HIGH (ref 0.0–40.4)

## 2023-10-22 LAB — PROTEIN C ACTIVITY: Protein C Activity: 119 % (ref 73–180)

## 2023-10-23 ENCOUNTER — Other Ambulatory Visit (HOSPITAL_BASED_OUTPATIENT_CLINIC_OR_DEPARTMENT_OTHER): Payer: Self-pay

## 2023-10-23 ENCOUNTER — Encounter (HOSPITAL_COMMUNITY): Payer: Self-pay | Admitting: Psychiatry

## 2023-10-23 ENCOUNTER — Other Ambulatory Visit: Payer: Self-pay

## 2023-10-23 ENCOUNTER — Ambulatory Visit (HOSPITAL_BASED_OUTPATIENT_CLINIC_OR_DEPARTMENT_OTHER): Admitting: Psychiatry

## 2023-10-23 DIAGNOSIS — Z630 Problems in relationship with spouse or partner: Secondary | ICD-10-CM

## 2023-10-23 DIAGNOSIS — F331 Major depressive disorder, recurrent, moderate: Secondary | ICD-10-CM

## 2023-10-23 DIAGNOSIS — F4312 Post-traumatic stress disorder, chronic: Secondary | ICD-10-CM

## 2023-10-23 LAB — FACTOR 5 LEIDEN

## 2023-10-23 LAB — CARDIOLIPIN ANTIBODIES, IGG, IGM, IGA
Anticardiolipin IgA: <9 U/mL (ref 0–11)
Anticardiolipin IgG: <9 GPL U/mL (ref 0–14)
Anticardiolipin IgM: 14 [MPL'U]/mL — ABNORMAL HIGH (ref 0–12)

## 2023-10-23 MED ORDER — QUETIAPINE FUMARATE 200 MG PO TABS
200.0000 mg | ORAL_TABLET | Freq: Every day | ORAL | 0 refills | Status: DC
  Filled 2023-10-23: qty 90, 90d supply, fill #0

## 2023-10-23 MED ORDER — LAMOTRIGINE 200 MG PO TABS
200.0000 mg | ORAL_TABLET | Freq: Every day | ORAL | 0 refills | Status: DC
  Filled 2023-10-23: qty 90, 90d supply, fill #0

## 2023-10-23 NOTE — Progress Notes (Signed)
 BH MD/PA/NP OP Progress Note  10/23/2023 11:56 AM William [name]  MRN:  [id number]  Chief Complaint:  Chief Complaint  Patient presents with   Follow-up   Stress   Medication Refill   HPI: Patient came today to the office for his appointment.  He reported things are continue to get better.  He has a better relationship with his son Karleen who started masters program.  Patient told once he apologized with his son about his behavior things are much better.  He also reported better relationship with his wife and marriage counseling is going very well.  He is living in the same house but is still having some issues which he is hoping to get better.  He is going to [place] on November 5.  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  He admitted sometime nightmares and flashback about his father and he feels some time guilty and regret when he get upset with his own son because that reminds him about his past.  Patient also mention about his other son Donnice is doing very well in the school.  Patient recently had extensive blood work ordered by hematologist.  Patient has a history of emboli and he like to get further workup.  He has blood work results but has not had discussed the results with the doctor.  Patient admitted sometime he takes extra Lamictal  100 mg that helped him a lot.  He had left a message that if it is okay to take extra Lamictal  some days.  Currently he is taking 200 mg Lamictal .  He is also on Seroquel  200 mg which helps his sleep.  He denies any mania, psychosis, hallucination.  He denies any hopelessness or any suicidal thoughts.  He is on Mounjaro  and trying to lose weight.  Patient is very well aware about his medication and he manages on his own.  Denies any rash, itching, tremors or shakes.  Visit Diagnosis:    ICD-10-CM   1. Chronic post-traumatic stress disorder (PTSD)  F43.12 lamoTRIgine  (LAMICTAL ) 200 MG tablet    QUEtiapine   (SEROQUEL ) 200 MG tablet    2. MDD (major depressive disorder), recurrent episode, moderate (HCC)  F33.1 lamoTRIgine  (LAMICTAL ) 200 MG tablet    QUEtiapine  (SEROQUEL ) 200 MG tablet    3. Marital problem  Z63.0 lamoTRIgine  (LAMICTAL ) 200 MG tablet       Past Psychiatric History: Reviewed H/O depression, anxiety and ADHD diagnosed by Dr. Marylin.  Tried Wellbutrin  but did not continued for a long time.  Started medication in 2000 by PCP.  Took Paxil but stopped due to sexual side effects, took Seroquel , Xanax , Zoloft  and Seroquel  from PCP.  History of PTSD. H/O inpatient at [facility] in October 2024 after intentional overdose on Xanax .  He did PHP at [facility] in [place]  and then in [place]  and recently in [place].  Ambien, trazodone  and Zoloft  discontinued in PHP.   Past Medical History:  Past Medical History:  Diagnosis Date   Adjustment disorder with mixed anxiety and depressed mood    Arthritis    Asthma    history   Diabetes mellitus    type 2   Elevated blood pressure    history of high blood pressure readings   History of chicken pox    Hypercholesteremia    Hypertension    Internal hemorrhoids    Tubular adenoma of colon     Past Surgical History:  Procedure Laterality Date   ELBOW SURGERY     right elbow 2010, left elbow 08/06/10 Rodman Schimke)   KNEE SURGERY     right knee new ACL- [phone]   KNEE SURGERY     left knee 2003   NASAL SEPTUM SURGERY     1992   PARTIAL HIP ARTHROPLASTY     right hip replacement    Family Psychiatric History: Reviewed  Family History:  Family History  Problem Relation Age of Onset   Diabetes Mother    Thyroid  disease Mother        Questionable   Heart disease Father        deceased   Other Neg Hx        hypogonadism   Colon cancer Neg Hx     Social History:  Social History   Socioeconomic History   Marital status: Married    Spouse name: Not on file   Number of children: Not on file   Years of education: Not  on file   Highest education level: Not on file  Occupational History   Not on file  Tobacco Use   Smoking status: Never   Smokeless tobacco: Never  Vaping Use   Vaping status: Never Used  Substance and Sexual Activity   Alcohol use: Yes    Alcohol/week: 7.0 - 14.0 standard drinks of alcohol    Types: 7 - 14 Glasses of wine per week   Drug use: No   Sexual activity: Not on file  Other Topics Concern   Not on file  Social History Narrative   Occupation: Real Therapist, occupational   Married -15 marriage (2nd marriage)   daughter 59,  2 sons (2nd marriage  11,6)   [place]   Never Smoked    Alcohol use-yes   Drug use-no    Regular exercise-no   Smoking Status:  never   Does Patient Exercise:  no   Caffeine use/day:  3-4 cups coffee daily   Drug Use:  no         Social Drivers of Corporate investment banker Strain: Not on file  Food Insecurity: Low Risk  (11/27/2022)   Received from [facility]   Hunger Vital Sign    Within the past 12 months, you worried that your food would run out before you got money to buy more: Never true    Within the past 12 months, the food you bought just didn't last and you didn't have money to get more. : Never true  Transportation Needs: No Transportation Needs (11/27/2022)   Received from [facility]    In the past 12 months, has lack of reliable transportation kept you from medical appointments, meetings, work or from getting things needed for daily living? : No  Physical Activity: Not on file  Stress: Not on file  Social Connections: Not on file    Allergies: No Known Allergies  Metabolic Disorder Labs: Lab Results  Component Value Date   HGBA1C 6.8 (A) 09/02/2023   MPG 246 07/04/2017   MPG 174 12/09/2016   Lab Results  Component Value Date   PROLACTIN 3.4 09/27/2014   Lab Results  Component Value Date   CHOL 78 06/10/2023   TRIG 173.0 (H) 06/10/2023   HDL 20.90 (L) 06/10/2023   CHOLHDL 4 06/10/2023   VLDL 34.6  06/10/2023   LDLCALC 23 06/10/2023   LDLCALC  11/01/2022     Comment:     .  LDL cholesterol not calculated. Triglyceride levels greater than 400 mg/dL invalidate calculated LDL results. . Reference range: <100 . Desirable range <100 mg/dL for primary prevention;   <70 mg/dL for patients with CHD or diabetic patients  with > or = 2 CHD risk factors. SABRA LDL-C is now calculated using the Martin-Hopkins  calculation, which is a validated novel method providing  better accuracy than the Friedewald equation in the  estimation of LDL-C.  Gladis APPLETHWAITE et al. SANDREA. 7986;689(80): 2061-2068  (http://education.[facility].com/faq/FAQ164)    Lab Results  Component Value Date   TSH 2.89 12/14/2019   TSH 3.91 06/28/2016    Therapeutic Level Labs: No results found for: LITHIUM No results found for: VALPROATE No results found for: CBMZ  Current Medications: Current Outpatient Medications  Medication Sig Dispense Refill   alfuzosin  (UROXATRAL ) 10 MG 24 hr tablet Take 1 tablet (10 mg total) by mouth daily. 90 tablet 3   apixaban  (ELIQUIS ) 5 MG TABS tablet Take 1 tablet (5 mg total) by mouth 2 (two) times daily. 180 tablet 0   carvedilol  (COREG ) 12.5 MG tablet Take 1 tablet (12.5 mg total) by mouth in the morning and 1 tablet (12.5 mg total) in the evening. Take with meals. 180 tablet 1   chlorhexidine  (PERIOGARD ) 0.12 % solution Rinse mouth with 15 mLs (1 capful) for 30 seconds in the morning and evening after tootbrushing. Spit after rinsing, do not swallow. 473 mL 3   Continuous Glucose Sensor (FREESTYLE LIBRE 3 PLUS SENSOR) MISC Use 1 sensor every 15 days as directed. 6 each 3   diclofenac  (VOLTAREN ) 75 MG EC tablet Take 1 tablet (75 mg total) by mouth 2 (two) times daily. 50 tablet 2   Empagliflozin -metFORMIN  HCl (SYNJARDY ) 12.06-998 MG TABS Take 1 tablet by mouth 2 (two) times daily. 180 tablet 3   famotidine  (PEPCID ) 20 MG tablet Take 1 tablet (20 mg total) by mouth 2 (two) times  daily. 180 tablet 1   fenofibrate  micronized (LOFIBRA) 200 MG capsule Take 1 capsule (200 mg total) by mouth daily before breakfast. 90 capsule 3   fluticasone  (FLONASE ) 50 MCG/ACT nasal spray Place 2 sprays into both nostrils daily. 16 g 1   hydrocortisone  (ANUSOL -HC) 2.5 % rectal cream Place 1 Application rectally 2 (two) times daily. 30 g 0   Insulin  Glargine (BASAGLAR  KWIKPEN) 100 UNIT/ML Inject 40-45 Units into the skin daily. 45 mL 3   Insulin  Glargine (BASAGLAR  KWIKPEN) 100 UNIT/ML Inject 35 Units into the skin daily. 45 mL 3   insulin  glargine (LANTUS ) 100 UNIT/ML Solostar Pen Inject 45 Units into the skin every night. 45 mL 3   Insulin  Pen Needle 29G X 12.7MM MISC Use as directed for insulin  injection. 100 each 3   ipratropium (ATROVENT ) 0.02 % nebulizer solution Take 2.5 mLs (0.5 mg total) by nebulization 4 (four) times daily. 75 mL 12   ipratropium-albuterol  (DUONEB) 0.5-2.5 (3) MG/3ML SOLN Take 3 mL by nebulization every 6 (six) hours as needed for wheezing. 180 mL 0   lamoTRIgine  (LAMICTAL ) 200 MG tablet Take 1 tablet (200 mg total) by mouth daily. 90 tablet 0   lisinopril  (ZESTRIL ) 10 MG tablet Take 1 tablet (10 mg total) by mouth daily. 90 tablet 1   Melatonin 10 MG TABS Take 10 mg by mouth at bedtime.     QUEtiapine  (SEROQUEL ) 200 MG tablet Take 1 tablet (200 mg total) by mouth at bedtime. 90 tablet 0   Respiratory Therapy Supplies (TUBING/WING TIP) MISC Once daily 1 each  8   rosuvastatin  (CRESTOR ) 40 MG tablet Take 1 tablet (40 mg total) by mouth daily. 90 tablet 3   Sodium Fluoride  (CLINPRO  5000) 1.1 % PSTE Use pea size amount of toothpaste and brush for 2 mins and spit. Do not eat/drink/rinse for 30 mins 100 mL 3   SYRINGE-NEEDLE, DISP, 3 ML (B-D 3CC LUER-LOK SYR 22GX1) 22G X 1 3 ML MISC Use to inject testosterone  10 each 3   tadalafil  (CIALIS ) 20 MG tablet Take 0.5-1 tablets (10-20 mg total) by mouth daily as needed for erectile dysfunction. 10 tablet 2   testosterone   enanthate (DELATESTRYL ) 200 MG/ML injection Inject 0.5 mLs (100 mg total) into the muscle once a week. 5 mL 5   tirzepatide  (MOUNJARO ) 7.5 MG/0.5ML Pen Inject 7.5 mg into the skin once a week. 6 mL 3   No current facility-administered medications for this visit.     Musculoskeletal: Strength & Muscle Tone: within normal limits Gait & Station: normal Patient leans: N/A  Psychiatric Specialty Exam: Review of Systems  Blood pressure 124/79, pulse 81, height 6' (1.829 m), weight 200 lb (90.7 kg).Body mass index is 27.12 kg/m.  General Appearance: Casual  Eye Contact:  Good  Speech:  Normal Rate  Volume:  Normal  Mood:  Euthymic  Affect:  Congruent  Thought Process:  Goal Directed  Orientation:  Full (Time, Place, and Person)  Thought Content: WDL   Suicidal Thoughts:  No  Homicidal Thoughts:  No  Memory:  Immediate;   Good Recent;   Good Remote;   Good  Judgement:  Good  Insight:  Present  Psychomotor Activity:  Normal  Concentration:  Concentration: Good and Attention Span: Good  Recall:  Good  Fund of Knowledge: Good  Language: Good  Akathisia:  No  Handed:  Right  AIMS (if indicated): not done  Assets:  Communication Skills Desire for Improvement Housing Talents/Skills Transportation  ADL's:  Intact  Cognition: WNL  Sleep:  Good   Screenings: GAD-7    Flowsheet Row Office Visit from 02/12/2023 in [facility]  Total GAD-7 Score 0   PHQ2-9    Flowsheet Row Office Visit from 10/20/2023 in [facility] [place] [facility] Office Visit from 06/10/2023 in [facility] at [facility] Office Visit from 02[facility]12/2023 in [facility] at [facility] Office Visit from 08/31/2021 in [facility] at [facility] Office Visit from 07/21/2020 in [facility] at [facility]  PHQ-2 Total Score 0 0  0 6 0  PHQ-9 Total Score -- 0 0 14 --     Assessment and Plan: Patient is 63 year old Caucasian married man with a history of hypertension, diabetes, cervical radiculopathy, hyperlipidemia, hypergonadism, PTSD, marital stress and major depressive disorder.  Patient doing better overall but like to know if he can take extra Lamictal .  Discussed how the medicine works.  Refill should not be taking as it as needed basis and it has a better efficacy if he takes on the regular basis higher dose.  If you feel the 300 mg helps and calm him down then I will suggest to take the 300 mg every day.  Patient has enough Lamictal  that he can try and if he like and feel better on 300 then he will call us  back.  Will continue Seroquel  200 mg at bedtime.  Patient is in marriage therapy with Bennet Dames and that has been very helpful.  Patient going to [place] in first week of November.  Recommend to call back if any question.  He will try Lamictal  300 and if he feel it is working then he will call us  back and we will give a new prescription.  He has enough 200 that he can cut down to try 300.  Will follow-up in 3 months.  Reminded side effect specially rash and itching with the Lamictal .   Collaboration of Care: Collaboration of Care: Other provider involved in patient's care AEB notes are available in epic to review  Patient/Guardian was advised Release of Information must be obtained prior to any record release in order to collaborate their care with an outside provider. Patient/Guardian was advised if they have not already done so to contact the registration department to sign all necessary forms in order for us  to release information regarding their care.   Consent: Patient/Guardian gives verbal consent for treatment and assignment of benefits for services provided during this visit. Patient/Guardian expressed understanding and agreed to proceed.    Leni ONEIDA Client, MD 10/23/2023, 11:56 AM

## 2023-10-24 ENCOUNTER — Other Ambulatory Visit (HOSPITAL_BASED_OUTPATIENT_CLINIC_OR_DEPARTMENT_OTHER): Payer: Self-pay

## 2023-10-24 ENCOUNTER — Telehealth (HOSPITAL_COMMUNITY): Payer: Self-pay

## 2023-10-24 ENCOUNTER — Other Ambulatory Visit: Payer: Self-pay

## 2023-10-24 ENCOUNTER — Other Ambulatory Visit (HOSPITAL_COMMUNITY): Payer: Self-pay

## 2023-10-24 ENCOUNTER — Other Ambulatory Visit (HOSPITAL_COMMUNITY): Payer: Self-pay | Admitting: *Deleted

## 2023-10-24 DIAGNOSIS — F4312 Post-traumatic stress disorder, chronic: Secondary | ICD-10-CM

## 2023-10-24 DIAGNOSIS — F331 Major depressive disorder, recurrent, moderate: Secondary | ICD-10-CM

## 2023-10-24 DIAGNOSIS — Z630 Problems in relationship with spouse or partner: Secondary | ICD-10-CM

## 2023-10-24 LAB — PROTHROMBIN GENE MUTATION

## 2023-10-24 LAB — PROTEIN C, TOTAL: Protein C, Total: 136 % (ref 60–150)

## 2023-10-24 MED ORDER — LAMOTRIGINE 150 MG PO TABS
150.0000 mg | ORAL_TABLET | Freq: Two times a day (BID) | ORAL | 0 refills | Status: DC
  Filled 2023-10-24: qty 60, 30d supply, fill #0

## 2023-10-24 MED ORDER — LAMOTRIGINE 200 MG PO TABS
200.0000 mg | ORAL_TABLET | Freq: Every day | ORAL | 0 refills | Status: DC
  Filled 2023-10-24: qty 90, 90d supply, fill #0

## 2023-10-24 MED ORDER — LAMOTRIGINE 150 MG PO TABS
150.0000 mg | ORAL_TABLET | Freq: Two times a day (BID) | ORAL | 0 refills | Status: DC
  Filled 2023-10-24 (×2): qty 60, 30d supply, fill #0

## 2023-10-24 NOTE — Telephone Encounter (Signed)
 As I stated in the previous note, the patient did contact me this morning and he informed me that he was incorrect when speaking with the provider and he did not have enough of the 200 mg Lamictal  to increase his dose. The pharmacy did not request this, the patient did.

## 2023-10-24 NOTE — Telephone Encounter (Signed)
 He has enough 200 mg Lamictal  that he can take 1-1/2 tablet to try 300.  We have to wait for the patient's call and not to act on the pharmacy request to write a new prescription.  Patient is agree that he will call us  in our office if 200 mg 1-1/2 tablet working for him and he can tolerate.  I will not send Lamictal  150 mg twice a day unless we hear directly from the patient.

## 2023-10-24 NOTE — Telephone Encounter (Signed)
 Patient sent a message thru mychart on 10/22/2023 ( see telephone encounter dated 09/29/2023) asking for a sooner appointment. I forwarded the message to the front desk and the patient was seen yesterday. Patient discussed with Dr. Curry increasing his Lamictal . Under Assessment and Plan the provider states that patient can increase his dose to 300 mg and that he should have enough Lamictal  to do so. Provider stated that the patient would call if he in fact did not have enough Lamictal . When I came in this morning, patient had sent another mychart message (added on to the message from 09/29/2023) asking for the 150 mg bid as he did not have enough to do so. I read the note from Dr. Curry and I sent in Lamictal  150 mg BID as requested by patient and authorized by provider.

## 2023-10-28 ENCOUNTER — Other Ambulatory Visit: Payer: Self-pay | Admitting: Family Medicine

## 2023-10-28 ENCOUNTER — Other Ambulatory Visit: Payer: Self-pay

## 2023-10-28 ENCOUNTER — Other Ambulatory Visit (HOSPITAL_COMMUNITY): Payer: Self-pay | Admitting: Psychiatry

## 2023-10-28 ENCOUNTER — Other Ambulatory Visit (HOSPITAL_BASED_OUTPATIENT_CLINIC_OR_DEPARTMENT_OTHER): Payer: Self-pay

## 2023-10-28 ENCOUNTER — Other Ambulatory Visit: Payer: Self-pay | Admitting: Medical Oncology

## 2023-10-28 ENCOUNTER — Other Ambulatory Visit: Payer: Self-pay | Admitting: Internal Medicine

## 2023-10-28 DIAGNOSIS — Z630 Problems in relationship with spouse or partner: Secondary | ICD-10-CM

## 2023-10-28 DIAGNOSIS — F331 Major depressive disorder, recurrent, moderate: Secondary | ICD-10-CM

## 2023-10-28 DIAGNOSIS — F4312 Post-traumatic stress disorder, chronic: Secondary | ICD-10-CM

## 2023-10-28 DIAGNOSIS — E785 Hyperlipidemia, unspecified: Secondary | ICD-10-CM

## 2023-10-28 MED ORDER — FLUZONE HIGH-DOSE 0.5 ML IM SUSY
0.5000 mL | PREFILLED_SYRINGE | Freq: Once | INTRAMUSCULAR | 0 refills | Status: AC
  Filled 2023-10-28: qty 0.5, 1d supply, fill #0

## 2023-10-28 MED ORDER — FLUTICASONE PROPIONATE 50 MCG/ACT NA SUSP
2.0000 | Freq: Every day | NASAL | 0 refills | Status: DC
  Filled 2023-10-28: qty 16, 30d supply, fill #0

## 2023-10-28 MED ORDER — CARVEDILOL 12.5 MG PO TABS
12.5000 mg | ORAL_TABLET | Freq: Two times a day (BID) | ORAL | 0 refills | Status: DC
  Filled 2023-10-28: qty 180, 90d supply, fill #0

## 2023-10-29 ENCOUNTER — Other Ambulatory Visit (HOSPITAL_BASED_OUTPATIENT_CLINIC_OR_DEPARTMENT_OTHER): Payer: Self-pay

## 2023-10-29 ENCOUNTER — Other Ambulatory Visit (HOSPITAL_COMMUNITY): Payer: Self-pay

## 2023-10-29 ENCOUNTER — Other Ambulatory Visit: Payer: Self-pay | Admitting: Family Medicine

## 2023-10-29 ENCOUNTER — Other Ambulatory Visit: Payer: Self-pay

## 2023-10-29 DIAGNOSIS — F331 Major depressive disorder, recurrent, moderate: Secondary | ICD-10-CM

## 2023-10-29 DIAGNOSIS — F4312 Post-traumatic stress disorder, chronic: Secondary | ICD-10-CM

## 2023-10-29 DIAGNOSIS — N401 Enlarged prostate with lower urinary tract symptoms: Secondary | ICD-10-CM

## 2023-10-29 DIAGNOSIS — Z630 Problems in relationship with spouse or partner: Secondary | ICD-10-CM

## 2023-10-29 DIAGNOSIS — F4322 Adjustment disorder with anxiety: Secondary | ICD-10-CM | POA: Diagnosis not present

## 2023-10-29 MED ORDER — ROSUVASTATIN CALCIUM 40 MG PO TABS
40.0000 mg | ORAL_TABLET | Freq: Every day | ORAL | 3 refills | Status: AC
  Filled 2023-10-29: qty 90, 90d supply, fill #0
  Filled 2024-01-27: qty 90, 90d supply, fill #1

## 2023-10-29 MED ORDER — LAMOTRIGINE 150 MG PO TABS: 150.0000 mg | ORAL_TABLET | Freq: Two times a day (BID) | ORAL | 0 refills | Status: DC

## 2023-10-29 MED ORDER — QUETIAPINE FUMARATE 200 MG PO TABS
200.0000 mg | ORAL_TABLET | Freq: Every day | ORAL | 0 refills | Status: DC
  Filled 2023-10-29: qty 90, 90d supply, fill #0

## 2023-10-29 MED ORDER — LAMOTRIGINE 150 MG PO TABS
150.0000 mg | ORAL_TABLET | Freq: Two times a day (BID) | ORAL | 0 refills | Status: DC
  Filled 2023-10-29: qty 180, 90d supply, fill #0

## 2023-10-29 MED ORDER — FENOFIBRATE 200 MG PO CAPS
200.0000 mg | ORAL_CAPSULE | Freq: Every day | ORAL | 3 refills | Status: AC
  Filled 2023-10-29: qty 90, 90d supply, fill #0
  Filled 2024-01-27: qty 90, 90d supply, fill #1

## 2023-10-29 MED ORDER — APIXABAN 5 MG PO TABS
5.0000 mg | ORAL_TABLET | Freq: Two times a day (BID) | ORAL | 0 refills | Status: DC
  Filled 2023-10-29: qty 180, 90d supply, fill #0

## 2023-10-29 NOTE — Telephone Encounter (Signed)
 LVM for pt.

## 2023-10-29 NOTE — Telephone Encounter (Signed)
 Patient has Seroquel  200 mg sent few days ago # 90 pills to his pharmacy.  I have sent Lamictal  150 mg twice a day # 180 pills today to his pharmacy.  Please inform the patient.

## 2023-10-29 NOTE — Telephone Encounter (Signed)
 I cannot see Seroquel  and Lamictal  150 mg in her medication list.  I tried to put the new prescription of Lamictal  but it says waiting for the approval.  I am okay to send these to prescription for 90 days.  If possible can you send this prescription to his pharmacy.

## 2023-10-30 ENCOUNTER — Other Ambulatory Visit (HOSPITAL_BASED_OUTPATIENT_CLINIC_OR_DEPARTMENT_OTHER): Payer: Self-pay

## 2023-10-30 NOTE — Telephone Encounter (Signed)
 It was send yesterday by me and then later by [name].  Please check the pharmacy and inform the patient.

## 2023-10-31 ENCOUNTER — Other Ambulatory Visit (HOSPITAL_BASED_OUTPATIENT_CLINIC_OR_DEPARTMENT_OTHER): Payer: Self-pay

## 2023-10-31 ENCOUNTER — Encounter: Payer: Self-pay | Admitting: Family Medicine

## 2023-11-03 ENCOUNTER — Other Ambulatory Visit: Payer: Self-pay | Admitting: Family Medicine

## 2023-11-03 ENCOUNTER — Other Ambulatory Visit: Payer: Self-pay

## 2023-11-03 ENCOUNTER — Other Ambulatory Visit (HOSPITAL_BASED_OUTPATIENT_CLINIC_OR_DEPARTMENT_OTHER): Payer: Self-pay

## 2023-11-03 ENCOUNTER — Other Ambulatory Visit: Payer: Self-pay | Admitting: Urology

## 2023-11-03 DIAGNOSIS — N401 Enlarged prostate with lower urinary tract symptoms: Secondary | ICD-10-CM

## 2023-11-03 MED ORDER — IPRATROPIUM-ALBUTEROL 0.5-2.5 (3) MG/3ML IN SOLN
3.0000 mL | Freq: Four times a day (QID) | RESPIRATORY_TRACT | 0 refills | Status: AC
  Filled 2023-11-03: qty 180, 15d supply, fill #0

## 2023-11-03 MED ORDER — SODIUM FLUORIDE 1.1 % DT GEL
DENTAL | 2 refills | Status: AC
  Filled 2023-11-03: qty 56, 30d supply, fill #0
  Filled 2024-01-19: qty 56, 30d supply, fill #1
  Filled 2024-01-27: qty 56, 30d supply, fill #2

## 2023-11-04 ENCOUNTER — Telehealth: Payer: Self-pay

## 2023-11-04 ENCOUNTER — Other Ambulatory Visit: Payer: Self-pay

## 2023-11-04 ENCOUNTER — Other Ambulatory Visit (HOSPITAL_BASED_OUTPATIENT_CLINIC_OR_DEPARTMENT_OTHER): Payer: Self-pay

## 2023-11-04 MED ORDER — ALBUTEROL SULFATE HFA 108 (90 BASE) MCG/ACT IN AERS
2.0000 | INHALATION_SPRAY | Freq: Four times a day (QID) | RESPIRATORY_TRACT | 0 refills | Status: AC | PRN
  Filled 2023-11-04: qty 6.7, 25d supply, fill #0

## 2023-11-04 NOTE — Telephone Encounter (Signed)
 Hi Dr Frann! We received an Rx for the nebulz soln for ipratrop/albuterol , but, Mr Cundari actually wanted the albuterol  metered dose inhaler, if possible to send that. Thx  14:08 You were added by Mabel Deward Frann, DO. 10 mins NW Mabel Deward [place], DO OK to send, Gila, thx

## 2023-11-05 DIAGNOSIS — F4322 Adjustment disorder with anxiety: Secondary | ICD-10-CM | POA: Diagnosis not present

## 2023-11-07 ENCOUNTER — Ambulatory Visit: Payer: Self-pay

## 2023-11-07 NOTE — Telephone Encounter (Signed)
 FYI Only or Action Required?: FYI only for provider.  Patient was last seen in primary care on 08/05/2023 by Frann Mabel Mt, DO.  Called Nurse Triage reporting Joint Swelling.  Symptoms began a week ago.  Interventions attempted: Nothing.  Symptoms are: unchanged.  Triage Disposition: See PCP When Office is Open (Within 3 Days)  Patient/caregiver understands and will follow disposition?: Yes    Copied from CRM [id number]. Topic: Clinical - Red Word Triage >> Nov 07, 2023 10:12 AM Tinnie BROCKS wrote: Red Word that prompted transfer to Nurse Triage: Pt has swelling under elbow Reason for Disposition  MILD OR MODERATE joint swelling (e.g., feels or looks mildly swollen or puffy)  Answer Assessment - Initial Assessment Questions 1. LOCATION: Where is the swelling? (e.g., left, right, both elbows)     Right  2. SIZE and DESCRIPTION: What does the swelling look like? (e.g., entire elbow, localized)     Puffed up 3. ONSET: When did the swelling start? Does it come and go, or is it there all the time?     10 days  4. WORK OR EXERCISE: Has there been any recent work, exercise or other activity that involved that part of the body?      Works out 5. AGGRAVATING FACTORS: What makes the elbow swelling worse? (e.g., work, sports activities)     no 6. ASSOCIATED SYMPTOMS: Is there any pain or redness?     Warmth  7. OTHER SYMPTOMS: Do you have any other symptoms? (e.g., fever)     Denies  Protocols used: Elbow Swelling-A-AH

## 2023-11-10 ENCOUNTER — Ambulatory Visit: Admitting: Family Medicine

## 2023-11-10 ENCOUNTER — Encounter: Payer: Self-pay | Admitting: Family Medicine

## 2023-11-10 ENCOUNTER — Other Ambulatory Visit: Payer: Self-pay

## 2023-11-10 VITALS — BP 126/76 | HR 100 | Temp 97.5°F | Resp 16 | Ht 73.0 in | Wt 200.0 lb

## 2023-11-10 DIAGNOSIS — M7021 Olecranon bursitis, right elbow: Secondary | ICD-10-CM

## 2023-11-10 MED ORDER — METHYLPREDNISOLONE ACETATE 40 MG/ML IJ SUSP
40.0000 mg | Freq: Once | INTRAMUSCULAR | Status: AC
  Administered 2023-11-10: 40 mg via INTRAMUSCULAR

## 2023-11-10 NOTE — Addendum Note (Signed)
 Addended by: Lawson Mahone M on: 11/10/2023 11:02 AM   Modules accepted: Orders

## 2023-11-10 NOTE — Patient Instructions (Addendum)
 Ice/cold pack over area for 10-15 min twice daily.  OK to take Tylenol  1000 mg (2 extra strength tabs) or 975 mg (3 regular strength tabs) every 6 hours as needed.  Some people find relief by applying a compressive wrap (Ace bandage or Coban wrap).  Let us  know if you need anything.

## 2023-11-10 NOTE — Progress Notes (Signed)
 Musculoskeletal Exam  Patient: William [name] DOB: 23-Nov-1960  DOS: 11/10/2023  SUBJECTIVE:  Chief Complaint:   Chief Complaint  Patient presents with   Joint Swelling    Elbow Swelling    DARRIS STAIGER is a 63 y.o.  male for evaluation and treatment of R elbow pain.   Onset:  4 weeks ago. No inj or change in activity.  Location: R elbow Character:  aching  Progression of issue:  is unchanged in pain  Associated symptoms: swelling No decreased ROM, bruising, redness, catching/locking.  Treatment: to date has been none.   Neurovascular symptoms: no  Past Medical History:  Diagnosis Date   Adjustment disorder with mixed anxiety and depressed mood    Arthritis    Asthma    history   Diabetes mellitus    type 2   Hypercholesteremia    Hypertension    Internal hemorrhoids    Tubular adenoma of colon     Objective: VITAL SIGNS: BP 126/76 (BP Location: Left Arm, Patient Position: Sitting)   Pulse 100   Temp (!) 97.5 F (36.4 C) (Temporal)   Resp 16   Ht 6' 1 (1.854 m)   Wt 200 lb (90.7 kg)   SpO2 95%   BMI 26.39 kg/m  Constitutional: Well formed, well developed. No acute distress. Thorax & Lungs: No accessory muscle use Musculoskeletal: R elbow.   Normal active range of motion: yes.   Normal passive range of motion: yes Tenderness to palpation: yes over olecranon process Deformity: yes; enlarged olecranon bursa that is fluid filled Ecchymosis: no Neurologic: Normal sensory function. Psychiatric: Normal mood. Age appropriate judgment and insight. Alert & oriented x 3.    Procedure note: Aspiration and injection of olecranon bursitis on the right Verbal consent obtained. The distal portion of the bursa was marked with an otoscope speculum and cleaned with an alcohol swab.   It was then sprayed with a topical freeze spray for anesthesia and a 23-gauge needle was introduced in a parallel fashion to the ulna.   Approximately 8 mL of bloody fluid was withdrawn. 20  mg of Depo-Medrol  along with 0.5 mL of 2% lidocaine  without epinephrine was then injected.   A Band-Aid was placed.   No immediate complications were noted.   He tolerated the procedure well.  Assessment:  Olecranon bursitis of right elbow - Plan: PR ARTHROCENTESIS ASPIR&/INJ INTERM JT/BURS W/O US   Plan: Compression, aspiration/injection today, ice, Tylenol .  Avoid direct trauma/pressure to the elbow. F/u as originally scheduled. The patient voiced understanding and agreement to the plan.   Mabel Mt [place], DO 11/10/23  10:57 AM

## 2023-11-13 ENCOUNTER — Ambulatory Visit (HOSPITAL_COMMUNITY): Admitting: Psychiatry

## 2023-11-20 ENCOUNTER — Ambulatory Visit: Admitting: Podiatry

## 2023-11-24 ENCOUNTER — Other Ambulatory Visit: Payer: Self-pay | Admitting: Family Medicine

## 2023-11-24 ENCOUNTER — Other Ambulatory Visit: Payer: Self-pay

## 2023-11-24 ENCOUNTER — Other Ambulatory Visit (HOSPITAL_BASED_OUTPATIENT_CLINIC_OR_DEPARTMENT_OTHER): Payer: Self-pay

## 2023-11-24 MED ORDER — FLUTICASONE PROPIONATE 50 MCG/ACT NA SUSP
2.0000 | Freq: Every day | NASAL | 3 refills | Status: AC
  Filled 2023-11-24: qty 16, 30d supply, fill #0
  Filled 2024-01-27: qty 16, 30d supply, fill #1

## 2023-11-25 ENCOUNTER — Other Ambulatory Visit (HOSPITAL_BASED_OUTPATIENT_CLINIC_OR_DEPARTMENT_OTHER): Payer: Self-pay

## 2023-11-25 MED ORDER — COMIRNATY 30 MCG/0.3ML IM SUSY
0.3000 mL | PREFILLED_SYRINGE | Freq: Once | INTRAMUSCULAR | 0 refills | Status: AC
  Filled 2023-11-25: qty 0.3, 1d supply, fill #0

## 2023-11-26 DIAGNOSIS — F4322 Adjustment disorder with anxiety: Secondary | ICD-10-CM | POA: Diagnosis not present

## 2023-12-03 DIAGNOSIS — F4322 Adjustment disorder with anxiety: Secondary | ICD-10-CM | POA: Diagnosis not present

## 2023-12-04 ENCOUNTER — Encounter: Payer: Self-pay | Admitting: Family Medicine

## 2023-12-04 ENCOUNTER — Other Ambulatory Visit: Payer: Self-pay | Admitting: Family Medicine

## 2023-12-04 ENCOUNTER — Other Ambulatory Visit (HOSPITAL_BASED_OUTPATIENT_CLINIC_OR_DEPARTMENT_OTHER): Payer: Self-pay

## 2023-12-04 DIAGNOSIS — T300 Burn of unspecified body region, unspecified degree: Secondary | ICD-10-CM

## 2023-12-04 MED ORDER — SILVER SULFADIAZINE 1 % EX CREA
TOPICAL_CREAM | CUTANEOUS | 0 refills | Status: AC
  Filled 2023-12-04 – 2023-12-08 (×2): qty 50, 30d supply, fill #0

## 2023-12-05 ENCOUNTER — Other Ambulatory Visit (HOSPITAL_BASED_OUTPATIENT_CLINIC_OR_DEPARTMENT_OTHER): Payer: Self-pay

## 2023-12-05 ENCOUNTER — Other Ambulatory Visit: Payer: Self-pay

## 2023-12-08 ENCOUNTER — Other Ambulatory Visit (HOSPITAL_BASED_OUTPATIENT_CLINIC_OR_DEPARTMENT_OTHER): Payer: Self-pay

## 2023-12-08 ENCOUNTER — Other Ambulatory Visit: Payer: Self-pay

## 2023-12-08 DIAGNOSIS — H43392 Other vitreous opacities, left eye: Secondary | ICD-10-CM | POA: Diagnosis not present

## 2023-12-09 ENCOUNTER — Other Ambulatory Visit (HOSPITAL_BASED_OUTPATIENT_CLINIC_OR_DEPARTMENT_OTHER): Payer: Self-pay

## 2023-12-14 ENCOUNTER — Encounter: Payer: Self-pay | Admitting: Internal Medicine

## 2023-12-15 ENCOUNTER — Encounter: Payer: Self-pay | Admitting: Internal Medicine

## 2023-12-29 ENCOUNTER — Other Ambulatory Visit: Payer: Self-pay

## 2024-01-08 ENCOUNTER — Ambulatory Visit: Admitting: Urology

## 2024-01-08 ENCOUNTER — Encounter: Payer: Self-pay | Admitting: Urology

## 2024-01-08 VITALS — BP 117/68 | HR 95 | Ht 72.0 in | Wt 195.0 lb

## 2024-01-08 DIAGNOSIS — N529 Male erectile dysfunction, unspecified: Secondary | ICD-10-CM | POA: Diagnosis not present

## 2024-01-08 DIAGNOSIS — R3912 Poor urinary stream: Secondary | ICD-10-CM | POA: Diagnosis not present

## 2024-01-08 DIAGNOSIS — N419 Inflammatory disease of prostate, unspecified: Secondary | ICD-10-CM | POA: Diagnosis not present

## 2024-01-08 DIAGNOSIS — R3915 Urgency of urination: Secondary | ICD-10-CM

## 2024-01-08 DIAGNOSIS — R300 Dysuria: Secondary | ICD-10-CM

## 2024-01-08 DIAGNOSIS — R399 Unspecified symptoms and signs involving the genitourinary system: Secondary | ICD-10-CM | POA: Diagnosis not present

## 2024-01-08 LAB — URINALYSIS, ROUTINE W REFLEX MICROSCOPIC
Bilirubin, UA: NEGATIVE
Ketones, UA: NEGATIVE
Leukocytes,UA: NEGATIVE
Nitrite, UA: NEGATIVE
Protein,UA: NEGATIVE
RBC, UA: NEGATIVE
Specific Gravity, UA: 1.02 (ref 1.005–1.030)
Urobilinogen, Ur: 0.2 mg/dL (ref 0.2–1.0)
pH, UA: 5.5 (ref 5.0–7.5)

## 2024-01-08 LAB — MICROSCOPIC EXAMINATION
Bacteria, UA: NONE SEEN
Epithelial Cells (non renal): NONE SEEN /HPF (ref 0–10)

## 2024-01-08 NOTE — Progress Notes (Signed)
 Assessment: 1. Prostatitis, unspecified prostatitis type   2. Lower urinary tract symptoms   3. Organic impotence     Plan: No evidence of UTI today. I discussed the possibility of prostatitis causing his symptoms.  He did not wish to have a prostate exam or treat with antibiotics at this time. I again discussed intracavernosal injections for management of his erectile dysfunction.  Information provided.  I provided him with the number for [facility] so he can contact them to discuss cost of the medication.  He will let me know if he wishes to proceed. Return to office in March 2026.  Chief Complaint:  Chief Complaint  Patient presents with   Prostatitis    History of Present Illness:  William [name] is a 63 y.o. male who is seen for evaluation of lower urinary tract symptoms, prostatitis, history of rising PSA, hypogonadism, and erectile dysfunction.  He was previously seen in 12/23 for a rising PSA.  PSA results: 5/19 0.3 9/19 0.38 2/20 0.36 7/20 0.37 5/21 0.26 11/21 0.67 3/22 0.28 5/23 0.24 9/23 3.21 9/24 0.54 5/25 0.49  No history of elevated PSA.  No history of UTIs or prostatitis.  No family history of prostate cancer.  He has been in on testosterone  replacement therapy with testosterone  injections 50 mg every 2 weeks.   Testosterone  level from 9/23: 617 He has been on testosterone  replacement therapy for approximately 10 years.  He initially used topical gel without significant benefit.  He has been receiving short acting injections for approximately 6 years.  He is currently being followed by Dr. Sam with endocrinology.  He was using 50 mg weekly. Testosterone  level from 5/25: 120 Testosterone  level from 7/25: 732 His dose was recently changed to 100 mg weekly.  He has lower urinary tract symptoms including frequency, urgency, nocturia x 2, decreased stream, intermittent stream, and sensation of incomplete emptying.  He has also noted  occasional dysuria.  No gross hematuria. IPSS = 25. He did not wish to pursue any medical treatment at the time of his visit in December 2023.  He was seen in July 2025 for further evaluation of increased lower urinary tract symptoms and erectile dysfunction. He reported worsening lower urinary tract symptoms including urgency, intermittent stream, weak stream, frequency, and sensation of incomplete emptying.  He has nocturia x 1.  He also has slight dysuria with voiding.  No gross hematuria or flank pain.  He had been on tamsulosin  0.8 mg for at least 6 months but had not seen a significant change in his symptoms. IPSS = 17/5. PVR = 154 ml. He was started on alfuzosin  10 mg daily in place of tamsulosin .  He was diagnosed with a UTI in July 2025. Urine culture from 08/05/2023 grew 50-100 K Klebsiella. He was initially treated with cephalexin  and changed to Macrobid . Urine culture from 08/21/2023 grew >100 K Klebsiella.  He was treated with Bactrim  x 20 days for UTI with prostatitis.  He continued on testosterone  injections 50 mg every week.   He continues to have problems with erectile dysfunction.  He reports inability to achieve an adequate erection for intercourse.  He reports approximately 20% rigidity.  No pain or curvature with erection.  No nocturnal or early morning erections.  He had tried tadalafil  and sildenafil  without benefit. Additional treatment options discussed with the patient at his visit in July 2025.  He was given information on VED.  He was seen in August 2025 for evaluation of  ongoing lower urinary tract symptoms.  He was concerned that his UTI had not cleared.  He continued to have frequency, urgency, dysuria, intermittent stream, hesitancy.  No gross hematuria.  He had completed the Bactrim  on 09/11/2023.  He did notice some improvement in his symptoms while he was on the antibiotic.  Since discontinuing the antibiotic he had noted continued symptoms.  He was taking alfuzosin   10 mg daily. IPSS = 23/6. PVR = 166 ml She was treated for an additional 3 weeks with Bactrim  DS for prostatitis.  He was continued on alfuzosin . He was given information on penile injections for management of his erectile dysfunction.  At his visit in 9/25, he was completing the last round of Bactrim .  He noted improvement in his urinary symptoms.  He reported rare dysuria and occasional incontinence.  No gross hematuria or flank pain. IPSS = 23/3.  He returns today for follow-up.  He has noted some increased urinary symptoms with urgency, intermittent stream, decreased force of stream, and dysuria.  No gross hematuria. IPSS = 19/5.   He continues with erectile dysfunction.  He is interested in penile injections.  Portions of the above documentation were copied from a prior visit for review purposes only.   Past Medical History:  Past Medical History:  Diagnosis Date   Adjustment disorder with mixed anxiety and depressed mood    Arthritis    Asthma    history   Diabetes mellitus    type 2   Hypercholesteremia    Hypertension    Internal hemorrhoids    Tubular adenoma of colon     Past Surgical History:  Past Surgical History:  Procedure Laterality Date   ELBOW SURGERY     right elbow 2010, left elbow 08/06/10 Rodman Schimke)   KNEE SURGERY     right knee new ACL- [phone]   KNEE SURGERY     left knee 2003   NASAL SEPTUM SURGERY     1992   PARTIAL HIP ARTHROPLASTY     right hip replacement    Allergies:  No Known Allergies  Family History:  Family History  Problem Relation Age of Onset   Diabetes Mother    Thyroid  disease Mother        Questionable   Heart disease Father        deceased   Other Neg Hx        hypogonadism   Colon cancer Neg Hx     Social History:  Social History   Tobacco Use   Smoking status: Never   Smokeless tobacco: Never  Vaping Use   Vaping status: Never Used  Substance Use Topics   Alcohol use: Yes    Alcohol/week: 7.0 -  14.0 standard drinks of alcohol    Types: 7 - 14 Glasses of wine per week   Drug use: No    ROS: Constitutional:  Negative for fever, chills, weight loss CV: Negative for chest pain, previous MI, hypertension Respiratory:  Negative for shortness of breath, wheezing, sleep apnea, frequent cough GI:  Negative for nausea, vomiting, bloody stool, GERD   Physical exam: BP 117/68   Pulse 95   Ht 6' (1.829 m)   Wt 195 lb (88.5 kg)   BMI 26.45 kg/m  GENERAL APPEARANCE:  Well appearing, well developed, well nourished, NAD HEENT:  Atraumatic, normocephalic, oropharynx clear NECK:  Supple without lymphadenopathy or thyromegaly ABDOMEN:  Soft, non-tender, no masses EXTREMITIES:  Moves all extremities well, without clubbing, cyanosis, or  edema NEUROLOGIC:  Alert and oriented x 3, normal gait, CN II-XII grossly intact MENTAL STATUS:  appropriate BACK:  Non-tender to palpation, No CVAT SKIN:  Warm, dry, and intact  Results: U/A: 0-5 WBCs, 0-2 RBCs

## 2024-01-12 ENCOUNTER — Other Ambulatory Visit (HOSPITAL_BASED_OUTPATIENT_CLINIC_OR_DEPARTMENT_OTHER): Payer: Self-pay

## 2024-01-12 MED ORDER — AMOXICILLIN-POT CLAVULANATE 875-125 MG PO TABS
1.0000 | ORAL_TABLET | Freq: Two times a day (BID) | ORAL | 0 refills | Status: AC
  Filled 2024-01-12: qty 28, 14d supply, fill #0

## 2024-01-12 MED ORDER — IBUPROFEN 800 MG PO TABS
800.0000 mg | ORAL_TABLET | ORAL | 0 refills | Status: AC
  Filled 2024-01-12: qty 20, 5d supply, fill #0

## 2024-01-12 MED ORDER — CHLORHEXIDINE GLUCONATE 0.12 % MT SOLN
OROMUCOSAL | 0 refills | Status: AC
  Filled 2024-01-12: qty 473, 14d supply, fill #0

## 2024-01-13 ENCOUNTER — Other Ambulatory Visit (HOSPITAL_BASED_OUTPATIENT_CLINIC_OR_DEPARTMENT_OTHER): Payer: Self-pay

## 2024-01-15 ENCOUNTER — Other Ambulatory Visit (HOSPITAL_BASED_OUTPATIENT_CLINIC_OR_DEPARTMENT_OTHER): Payer: Self-pay

## 2024-01-15 ENCOUNTER — Encounter (HOSPITAL_COMMUNITY): Payer: Self-pay | Admitting: Psychiatry

## 2024-01-15 ENCOUNTER — Ambulatory Visit (HOSPITAL_COMMUNITY): Admitting: Psychiatry

## 2024-01-15 ENCOUNTER — Other Ambulatory Visit: Payer: Self-pay

## 2024-01-15 DIAGNOSIS — F331 Major depressive disorder, recurrent, moderate: Secondary | ICD-10-CM

## 2024-01-15 DIAGNOSIS — Z630 Problems in relationship with spouse or partner: Secondary | ICD-10-CM | POA: Diagnosis not present

## 2024-01-15 DIAGNOSIS — F4312 Post-traumatic stress disorder, chronic: Secondary | ICD-10-CM | POA: Diagnosis not present

## 2024-01-15 MED ORDER — QUETIAPINE FUMARATE 200 MG PO TABS
200.0000 mg | ORAL_TABLET | Freq: Every day | ORAL | 0 refills | Status: AC
  Filled 2024-01-15 – 2024-01-27 (×2): qty 90, 90d supply, fill #0

## 2024-01-15 MED ORDER — LAMOTRIGINE 150 MG PO TABS
150.0000 mg | ORAL_TABLET | Freq: Two times a day (BID) | ORAL | 0 refills | Status: AC
  Filled 2024-01-15 – 2024-01-27 (×2): qty 180, 90d supply, fill #0

## 2024-01-15 NOTE — Progress Notes (Signed)
 [facility] MD/PA/NP OP Progress Note  01/15/2024 11:36 AM William [name] William [name]  MRN:  [id number]  Chief Complaint:  Chief Complaint  Patient presents with   Follow-up   Medication Refill   HPI: Patient came to his appointment to the office.  He had a 3-week trip to [place] and he had a very good time at [employer].  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  He reported things are going very well and he is taking his medication regularly and on time.  He denies any irritability, anger, mania or any psychosis.  He is disappointed because his son is not communicating lately but he want to give some time to his son.  He reported relationship with the wife is going very well.  Patient told wife is in [place] but he is going to pick up her today.  Patient does not want to change the medications since it is helping his depression, irritability, anger and sleep.  He has no rash, itching, tremors or shakes.  He also reported blood sugar is under control.  His appetite is okay.  He denies any hopelessness or any suicidal thoughts.  His appetite is okay and his weight stable.   Visit Diagnosis:    ICD-10-CM   1. Chronic post-traumatic stress disorder (PTSD)  F43.12 lamoTRIgine  (LAMICTAL ) 150 MG tablet    QUEtiapine  (SEROQUEL ) 200 MG tablet    2. MDD (major depressive disorder), recurrent episode, moderate (HCC)  F33.1 lamoTRIgine  (LAMICTAL ) 150 MG tablet    QUEtiapine  (SEROQUEL ) 200 MG tablet    3. Marital problem  Z63.0 lamoTRIgine  (LAMICTAL ) 150 MG tablet        Past Psychiatric History: Reviewed H/O depression, anxiety and ADHD diagnosed by Dr. Marylin.  Tried Wellbutrin  but did not continued for a long time.  Started medication in 2000 by PCP.  Took Paxil but stopped due to sexual side effects, took Seroquel , Xanax , Zoloft  and Seroquel  from PCP.  History of PTSD. H/O inpatient at [facility] in October 2024 after intentional overdose on Xanax .  He did [facility] [place]  and then in [place]  and  recently in [place].  Ambien, trazodone  and Zoloft  discontinued in PHP.   Past Medical History:  Past Medical History:  Diagnosis Date   Adjustment disorder with mixed anxiety and depressed mood    Arthritis    Asthma    history   Diabetes mellitus    type 2   Hypercholesteremia    Hypertension    Internal hemorrhoids    Tubular adenoma of colon     Past Surgical History:  Procedure Laterality Date   ELBOW SURGERY     right elbow 2010, left elbow 08/06/10 Rodman Schimke)   KNEE SURGERY     right knee new ACL- [phone]   KNEE SURGERY     left knee 2003   NASAL SEPTUM SURGERY     1992   PARTIAL HIP ARTHROPLASTY     right hip replacement    Family Psychiatric History: Reviewed  Family History:  Family History  Problem Relation Age of Onset   Diabetes Mother    Thyroid  disease Mother        Questionable   Heart disease Father        deceased   Other Neg Hx        hypogonadism   Colon cancer Neg Hx     Social History:  Social History   Socioeconomic History   Marital status: Married  Spouse name: Not on file   Number of children: Not on file   Years of education: Not on file   Highest education level: Not on file  Occupational History   Not on file  Tobacco Use   Smoking status: Never   Smokeless tobacco: Never  Vaping Use   Vaping status: Never Used  Substance and Sexual Activity   Alcohol use: Yes    Alcohol/week: 7.0 - 14.0 standard drinks of alcohol    Types: 7 - 14 Glasses of wine per week   Drug use: No   Sexual activity: Not on file  Other Topics Concern   Not on file  Social History Narrative   Occupation: Real Therapist, Occupational   Married -15 marriage (2nd marriage)   daughter 44,  2 sons (2nd marriage  11,6)   [place]   Never Smoked    Alcohol use-yes   Drug use-no    Regular exercise-no   Smoking Status:  never   Does Patient Exercise:  no   Caffeine use/day:  3-4 cups coffee daily   Drug Use:  no         Social Drivers of Health    Tobacco Use: Low Risk (01/08/2024)   Patient History    Smoking Tobacco Use: Never    Smokeless Tobacco Use: Never    Passive Exposure: Not on file  Financial Resource Strain: Not on file  Food Insecurity: Low Risk (11/27/2022)   Received from [facility]   Epic    Within the past 12 months, you worried that your food would run out before you got money to buy more: Never true    Within the past 12 months, the food you bought just didn't last and you didn't have money to get more. : Never true  Transportation Needs: No Transportation Needs (11/27/2022)   Received from [facility]    In the past 12 months, has lack of reliable transportation kept you from medical appointments, meetings, work or from getting things needed for daily living? : No  Physical Activity: Not on file  Stress: Not on file  Social Connections: Not on file  Depression (PHQ2-9): Low Risk (11/10/2023)   Depression (PHQ2-9)    PHQ-2 Score: 0  Alcohol Screen: Not on file  Housing: Low Risk (11/27/2022)   Received from [facility]   Epic    What is your living situation today?: I have a steady place to live    Think about the place you live. Do you have problems with any of the following? Choose all that apply:: None/None on this list  Utilities: Low Risk (11/27/2022)   Received from [facility]   Utilities    In the past 12 months has the electric, gas, oil, or water company threatened to shut off services in your home? : No  Health Literacy: Not on file    Allergies: No Known Allergies  Metabolic Disorder Labs: Lab Results  Component Value Date   HGBA1C 6.8 (A) 09/02/2023   MPG 246 07/04/2017   MPG 174 12/09/2016   Lab Results  Component Value Date   PROLACTIN 3.4 09/27/2014   Lab Results  Component Value Date   CHOL 78 06/10/2023   TRIG 173.0 (H) 06/10/2023   HDL 20.90 (L) 06/10/2023   CHOLHDL 4 06/10/2023   VLDL 34.6 06/10/2023   LDLCALC 23 06/10/2023   LDLCALC   11/01/2022     Comment:     . LDL cholesterol  not calculated. Triglyceride levels greater than 400 mg/dL invalidate calculated LDL results. . Reference range: <100 . Desirable range <100 mg/dL for primary prevention;   <70 mg/dL for patients with CHD or diabetic patients  with > or = 2 CHD risk factors. SABRA LDL-C is now calculated using the Martin-Hopkins  calculation, which is a validated novel method providing  better accuracy than the Friedewald equation in the  estimation of LDL-C.  Gladis APPLETHWAITE et al. SANDREA. 7986;689(80): 2061-2068  (http://education.[facility].com/faq/FAQ164)    Lab Results  Component Value Date   TSH 2.89 12/14/2019   TSH 3.91 06/28/2016    Therapeutic Level Labs: No results found for: LITHIUM No results found for: VALPROATE No results found for: CBMZ  Current Medications: Current Outpatient Medications  Medication Sig Dispense Refill   albuterol  (VENTOLIN  HFA) 108 (90 Base) MCG/ACT inhaler Inhale 2 puffs into the lungs every 6 (six) hours as needed for wheezing 6.7 g 0   alfuzosin  (UROXATRAL ) 10 MG 24 hr tablet Take 1 tablet (10 mg total) by mouth daily. 90 tablet 3   apixaban  (ELIQUIS ) 5 MG TABS tablet Take 1 tablet (5 mg total) by mouth 2 (two) times daily. 180 tablet 0   carvedilol  (COREG ) 12.5 MG tablet Take 1 tablet (12.5 mg total) by mouth 2 (two) times daily. 180 tablet 0   chlorhexidine  (PERIOGARD ) 0.12 % solution Rinse mouth with 15 mLs (1 capful) for 30 seconds in the morning and evening after tootbrushing. Spit after rinsing, do not swallow. 473 mL 3   Continuous Glucose Sensor (FREESTYLE LIBRE 3 PLUS SENSOR) MISC Use 1 sensor every 15 days as directed. 6 each 3   Empagliflozin -metFORMIN  HCl (SYNJARDY ) 12.06-998 MG TABS Take 1 tablet by mouth 2 (two) times daily. 180 tablet 3   famotidine  (PEPCID ) 20 MG tablet Take 1 tablet (20 mg total) by mouth 2 (two) times daily. 180 tablet 1   fenofibrate  micronized (LOFIBRA) 200 MG capsule Take  1 capsule (200 mg total) by mouth daily before breakfast. 90 capsule 3   fluticasone  (FLONASE ) 50 MCG/ACT nasal spray Place 2 sprays into both nostrils daily. 16 g 3   Insulin  Glargine (BASAGLAR  KWIKPEN) 100 UNIT/ML Inject 40-45 Units into the skin daily. 45 mL 3   Insulin  Glargine (BASAGLAR  KWIKPEN) 100 UNIT/ML Inject 35 Units into the skin daily. 45 mL 3   insulin  glargine (LANTUS ) 100 UNIT/ML Solostar Pen Inject 45 Units into the skin every night. 45 mL 3   Insulin  Pen Needle 29G X 12.7MM MISC Use as directed for insulin  injection. 100 each 3   ipratropium (ATROVENT ) 0.02 % nebulizer solution Take 2.5 mLs (0.5 mg total) by nebulization 4 (four) times daily. 75 mL 12   ipratropium-albuterol  (DUONEB) 0.5-2.5 (3) MG/3ML SOLN Take 3 mL by nebulization every 6 (six) hours as needed for wheezing. 180 mL 0   lamoTRIgine  (LAMICTAL ) 150 MG tablet Take 1 tablet (150 mg total) by mouth 2 (two) times daily. 180 tablet 0   lisinopril  (ZESTRIL ) 10 MG tablet Take 1 tablet (10 mg total) by mouth daily. 90 tablet 1   QUEtiapine  (SEROQUEL ) 200 MG tablet Take 1 tablet (200 mg total) by mouth at bedtime. 90 tablet 0   Respiratory Therapy Supplies (TUBING/WING TIP) MISC Once daily 1 each 8   rosuvastatin  (CRESTOR ) 40 MG tablet Take 1 tablet (40 mg total) by mouth daily. 90 tablet 3   silver  sulfADIAZINE  (SILVADENE ) 1 % cream Apply daily as needed for a wound or burn. 50 g 0  Sodium Fluoride  (CLINPRO  5000) 1.1 % PSTE Use pea size amount of toothpaste and brush for 2 mins and spit. Do not eat/drink/rinse for 30 mins 100 mL 3   sodium fluoride  (FLUORISHIELD) 1.1 % GEL dental gel Use pea-sized amount and brush for 2 minutes and spit. Do not eat/drink/rinse for 30 minutes. 56 g 2   SYRINGE-NEEDLE, DISP, 3 ML (B-D 3CC LUER-LOK SYR 22GX1) 22G X 1 3 ML MISC Use to inject testosterone  10 each 3   tadalafil  (CIALIS ) 20 MG tablet Take 0.5-1 tablets (10-20 mg total) by mouth daily as needed for erectile dysfunction. 10 tablet  2   testosterone  enanthate (DELATESTRYL ) 200 MG/ML injection Inject 0.5 mLs (100 mg total) into the muscle once a week. 5 mL 5   tirzepatide  (MOUNJARO ) 7.5 MG/0.5ML Pen Inject 7.5 mg into the skin once a week. 6 mL 3   amoxicillin -clavulanate (AUGMENTIN ) 875-125 MG tablet Take 1 tablet by mouth 2 (two) times daily until complete. 28 tablet 0   chlorhexidine  (PERIDEX ) 0.12 % solution RINSE MOUTH WITH (1 CAPFUL) FOR 30 SECONDS AM AND PM AFTER TOOTHBRUSHING. EXPECTORATE AFTER RINSING, DO NOT SWALLOW 473 mL 0   ibuprofen  (IBU) 800 MG tablet Take 1 tablet (800 mg total) by mouth every 6 to 8 hours as needed. 20 tablet 0   No current facility-administered medications for this visit.     Musculoskeletal: Strength & Muscle Tone: within normal limits Gait & Station: normal Patient leans: N/A  Psychiatric Specialty Exam: Review of Systems  Blood pressure 120/74, pulse 93, height 6' (1.829 m), weight 196 lb (88.9 kg).Body mass index is 26.58 kg/m.  General Appearance: Well Groomed  Eye Contact:  Good  Speech:  Normal Rate  Volume:  Normal  Mood:  Euthymic  Affect:  Congruent  Thought Process:  Goal Directed  Orientation:  Full (Time, Place, and Person)  Thought Content: WDL   Suicidal Thoughts:  No  Homicidal Thoughts:  No  Memory:  Immediate;   Good Recent;   Good Remote;   Good  Judgement:  Good  Insight:  Present  Psychomotor Activity:  Normal  Concentration:  Concentration: Good and Attention Span: Good  Recall:  Good  Fund of Knowledge: Good  Language: Good  Akathisia:  No  Handed:  Right  AIMS (if indicated): not done  Assets:  Communication Skills Desire for Improvement Housing Talents/Skills Transportation  ADL's:  Intact  Cognition: WNL  Sleep:  Good   Screenings: GAD-7    Garment/textile Technologist Visit from 11/10/2023 in [facility] at [facility] Office Visit from 02/12/2023 in [facility] at [facility]   Total GAD-7 Score 2 0   PHQ2-9    Flowsheet Row Office Visit from 11/10/2023 in [facility] at [facility] Office Visit from 10/20/2023 in [facility] [place] Office Visit from 06/10/2023 in [facility] Primary Care at [facility] Office Visit from 02/12/2023 in [facility] at [facility] Office Visit from 08/31/2021 in [facility] at [facility]  PHQ-2 Total Score 0 0 0 0 6  PHQ-9 Total Score 0 -- 0 0 14     Assessment and Plan: Patient is 63 year old Caucasian married man with a history of hypertension, diabetes, cervical radiculopathy, hyperlipidemia, hypergonadism, PTSD, major depressive disorder and family stress.  He is stable  on his current medication.  He feels that he is all right combination of dosage and medication.  He has no rash, itching, tremors or shakes.  He is in marriage therapy with Bennet Dames.  Continue Lamictal  300 mg daily, Seroquel  200 mg at bedtime.  Occasionally he takes extra half tablet but that has been less frequent.  Recommended to call back if is any question or any concern.  Follow-up in 3 months.  Collaboration of Care: Collaboration of Care: Other provider involved in patient's care AEB notes are available in epic to review  Patient/Guardian was advised Release of Information must be obtained prior to any record release in order to collaborate their care with an outside provider. Patient/Guardian was advised if they have not already done so to contact the registration department to sign all necessary forms in order for us  to release information regarding their care.   Consent: Patient/Guardian gives verbal consent for treatment and assignment of benefits for services provided during this visit. Patient/Guardian expressed understanding and agreed to proceed.    Leni ONEIDA Client, MD 01/15/2024, 11:36 AM

## 2024-01-19 ENCOUNTER — Other Ambulatory Visit (HOSPITAL_BASED_OUTPATIENT_CLINIC_OR_DEPARTMENT_OTHER): Payer: Self-pay

## 2024-01-19 DIAGNOSIS — H43392 Other vitreous opacities, left eye: Secondary | ICD-10-CM | POA: Diagnosis not present

## 2024-01-20 ENCOUNTER — Other Ambulatory Visit (HOSPITAL_BASED_OUTPATIENT_CLINIC_OR_DEPARTMENT_OTHER): Payer: Self-pay

## 2024-01-26 ENCOUNTER — Other Ambulatory Visit (HOSPITAL_BASED_OUTPATIENT_CLINIC_OR_DEPARTMENT_OTHER): Payer: Self-pay

## 2024-01-27 ENCOUNTER — Other Ambulatory Visit: Payer: Self-pay | Admitting: Internal Medicine

## 2024-01-27 ENCOUNTER — Other Ambulatory Visit: Payer: Self-pay | Admitting: Family Medicine

## 2024-01-27 ENCOUNTER — Other Ambulatory Visit: Payer: Self-pay | Admitting: Medical Oncology

## 2024-01-27 ENCOUNTER — Other Ambulatory Visit (HOSPITAL_BASED_OUTPATIENT_CLINIC_OR_DEPARTMENT_OTHER): Payer: Self-pay

## 2024-01-27 MED ORDER — FAMOTIDINE 20 MG PO TABS
20.0000 mg | ORAL_TABLET | Freq: Two times a day (BID) | ORAL | 1 refills | Status: AC
  Filled 2024-01-27: qty 180, 90d supply, fill #0

## 2024-01-27 MED ORDER — CARVEDILOL 12.5 MG PO TABS
12.5000 mg | ORAL_TABLET | Freq: Two times a day (BID) | ORAL | 1 refills | Status: AC
  Filled 2024-01-27: qty 180, 90d supply, fill #0

## 2024-01-27 MED ORDER — APIXABAN 5 MG PO TABS
5.0000 mg | ORAL_TABLET | Freq: Two times a day (BID) | ORAL | 2 refills | Status: AC
  Filled 2024-01-27: qty 180, 90d supply, fill #0

## 2024-01-27 MED ORDER — LISINOPRIL 10 MG PO TABS
10.0000 mg | ORAL_TABLET | Freq: Every day | ORAL | 1 refills | Status: AC
  Filled 2024-01-27: qty 90, 90d supply, fill #0

## 2024-01-27 MED ORDER — SYNJARDY 12.5-1000 MG PO TABS
1.0000 | ORAL_TABLET | Freq: Two times a day (BID) | ORAL | 3 refills | Status: AC
  Filled 2024-01-27: qty 180, 90d supply, fill #0

## 2024-01-28 ENCOUNTER — Other Ambulatory Visit (HOSPITAL_BASED_OUTPATIENT_CLINIC_OR_DEPARTMENT_OTHER): Payer: Self-pay

## 2024-01-28 ENCOUNTER — Other Ambulatory Visit: Payer: Self-pay

## 2024-02-13 ENCOUNTER — Encounter: Payer: Self-pay | Admitting: Family Medicine

## 2024-02-13 ENCOUNTER — Ambulatory Visit: Payer: Self-pay

## 2024-02-13 ENCOUNTER — Ambulatory Visit (INDEPENDENT_AMBULATORY_CARE_PROVIDER_SITE_OTHER): Admitting: Family Medicine

## 2024-02-13 ENCOUNTER — Other Ambulatory Visit (HOSPITAL_BASED_OUTPATIENT_CLINIC_OR_DEPARTMENT_OTHER): Payer: Self-pay

## 2024-02-13 VITALS — BP 148/90 | HR 92 | Temp 97.5°F | Resp 16 | Ht 72.0 in | Wt 197.0 lb

## 2024-02-13 DIAGNOSIS — R200 Anesthesia of skin: Secondary | ICD-10-CM | POA: Diagnosis not present

## 2024-02-13 DIAGNOSIS — R202 Paresthesia of skin: Secondary | ICD-10-CM | POA: Diagnosis not present

## 2024-02-13 MED ORDER — PREDNISONE 10 MG PO TABS
ORAL_TABLET | ORAL | 0 refills | Status: DC
  Filled 2024-02-13: qty 20, 12d supply, fill #0

## 2024-02-13 NOTE — Telephone Encounter (Signed)
 FYI Only or Action Required?: FYI only for provider: appointment scheduled on 02/13/24.  Patient was last seen in primary care on 11/10/2023 by Frann Mabel Mt, DO.  Called Nurse Triage reporting Arm Injury and Arm Pain.  Symptoms began yesterday.  Interventions attempted: OTC medications: ibuprofen .  Symptoms are: unchanged.  Triage Disposition: See HCP Within 4 Hours (Or PCP Triage)  Patient/caregiver understands and will follow disposition?: Yes                                  1. MECHANISM: How did the injury happen?     Reports he was using an arm pulling machine at the gym last night and felt a pop pop 2. ONSET: When did the injury happen? (e.g., minutes, hours ago)      Yesterday 3. LOCATION: Where is the injury located? Which arm?     Left arm 4. APPEARANCE of INJURY: What does the injury look like?      Reports swelling of left hand 7. PAIN: Is there pain? If Yes, ask: How bad is the pain? (Scale 0-10; or none, mild, moderate, severe)     Describes pain as more of a nuisance, rates nuisance an 8 9. OTHER SYMPTOMS: Do you have any other symptoms?  (e.g., numbness in hand)     Reports tingling on and off in left arm, states he can feel his fingers at this time, stiffness when opening and closing left hand Denies headache, denies dizziness, denies vision changes, denies speech changes, denies facial dropping     This RN advised in-person evaluation. No availability with PCP. Patient has been scheduled for appointment with alternate provider in office today.    Reason for Disposition  [1] SEVERE pain (e.g., excruciating) AND [2] not improved 2 hours after pain medicine/ice packs  Protocols used: Arm Injury-A-AH  Copied from CRM #[id number]. Topic: Clinical - Red Word Triage >> Feb 13, 2024 11:40 AM Richerd A wrote: [facility] that prompted transfer to Nurse Triage: Patient has pulled muscle in left arm-it is  tingling, swollen with pain

## 2024-02-13 NOTE — Progress Notes (Signed)
 ---------------  Subjective:    Patient ID: William [name], male    DOB: [birth year], 64 y.o.   MRN: [id number]  Chief Complaint  Patient presents with   Acute Visit    Patient presents today for left hand numbness since last night.    HPI Patient is in today for L hand and arm numbness.  Discussed the use of AI scribe software for clinical note transcription with the patient, who gave verbal consent to proceed.  History of Present Illness William [name] is a 64 year old male with diabetes who presents with hand numbness and tingling after a gym incident.  He experienced numbness and tingling in his hand following an incident at the gym yesterday while using a machine that involved pulling back and forth. This led to soreness and tingling in all fingers, which worsens in certain positions, such as sitting in a car or lying down, causing parts of his hand to become numb. The tingling sensation extends up and down his arm, and his hand feels sore with movement. His ring felt tighter than usual this morning, although he does not typically remove it. He took 800 mg of ibuprofen  this morning, which did not alleviate his symptoms. He describes a sensation of coldness in his hand and notes that the tingling is persistent, though temporarily relieved by raising his hand on a pillow. He also has difficulty gripping objects tightly with the affected hand.  He has a history of diabetes and takes insulin  regularly, with a regimen of ten units in the morning and five in the evening. He monitors his blood sugar closely and reports an A1c of 6.7 to 6.9. He also takes testosterone .  In addition to his current hand symptoms, he mentions a separate issue with his ears, suspecting that his ear ducts are clogged, as he experiences temporary improvement in hearing when he pinches his nose and blows air through it.    Past Medical History:  Diagnosis Date   Adjustment disorder with mixed anxiety and depressed mood     Arthritis    Asthma    history   Diabetes mellitus    type 2   Hypercholesteremia    Hypertension    Internal hemorrhoids    Tubular adenoma of colon     Past Surgical History:  Procedure Laterality Date   ELBOW SURGERY     right elbow 2010, left elbow 08/06/10 Rodman Schimke)   KNEE SURGERY     right knee new ACL- [id number]   KNEE SURGERY     left knee 2003   NASAL SEPTUM SURGERY     1992   PARTIAL HIP ARTHROPLASTY     right hip replacement    Family History  Problem Relation Age of Onset   Diabetes Mother    Thyroid  disease Mother        Questionable   Heart disease Father        deceased   Other Neg Hx        hypogonadism   Colon cancer Neg Hx     Social History   Socioeconomic History   Marital status: Married    Spouse name: Not on file   Number of children: Not on file   Years of education: Not on file   Highest education level: Not on file  Occupational History   Not on file  Tobacco Use   Smoking status: Never   Smokeless tobacco: Never  Vaping Use   Vaping status:  Never Used  Substance and Sexual Activity   Alcohol use: Yes    Alcohol/week: 7.0 - 14.0 standard drinks of alcohol    Types: 7 - 14 Glasses of wine per week   Drug use: No   Sexual activity: Not on file  Other Topics Concern   Not on file  Social History Narrative   Occupation: Real Therapist, Occupational   Married -15 marriage (2nd marriage)   daughter 82,  2 sons (2nd marriage  11,6)   [place]   Never Smoked    Alcohol use-yes   Drug use-no    Regular exercise-no   Smoking Status:  never   Does Patient Exercise:  no   Caffeine use/day:  3-4 cups coffee daily   Drug Use:  no         Social Drivers of Health   Tobacco Use: Low Risk (02/13/2024)   Patient History    Smoking Tobacco Use: Never    Smokeless Tobacco Use: Never    Passive Exposure: Not on file  Financial Resource Strain: Not on file  Food Insecurity: Low Risk (11/27/2022)   Received from [facility]   Epic     Within the past 12 months, you worried that your food would run out before you got money to buy more: Never true    Within the past 12 months, the food you bought just didn't last and you didn't have money to get more. : Never true  Transportation Needs: No Transportation Needs (11/27/2022)   Received from [facility]    In the past 12 months, has lack of reliable transportation kept you from medical appointments, meetings, work or from getting things needed for daily living? : No  Physical Activity: Not on file  Stress: Not on file  Social Connections: Not on file  Intimate Partner Violence: Not on file  Depression (PHQ2-9): Low Risk (11/10/2023)   Depression (PHQ2-9)    PHQ-2 Score: 0  Alcohol Screen: Not on file  Housing: Low Risk (11/27/2022)   Received from [facility]   Epic    What is your living situation today?: I have a steady place to live    Think about the place you live. Do you have problems with any of the following? Choose all that apply:: None/None on this list  Utilities: Low Risk (11/27/2022)   Received from [facility]   Utilities    In the past 12 months has the electric, gas, oil, or water company threatened to shut off services in your home? : No  Health Literacy: Not on file    Outpatient Medications Prior to Visit  Medication Sig Dispense Refill   albuterol  (VENTOLIN  HFA) 108 (90 Base) MCG/ACT inhaler Inhale 2 puffs into the lungs every 6 (six) hours as needed for wheezing 6.7 g 0   alfuzosin  (UROXATRAL ) 10 MG 24 hr tablet Take 1 tablet (10 mg total) by mouth daily. 90 tablet 3   amoxicillin -clavulanate (AUGMENTIN ) 875-125 MG tablet Take 1 tablet by mouth 2 (two) times daily until complete. 28 tablet 0   apixaban  (ELIQUIS ) 5 MG TABS tablet Take 1 tablet (5 mg total) by mouth 2 (two) times daily. 180 tablet 2   carvedilol  (COREG ) 12.5 MG tablet Take 1 tablet (12.5 mg total) by mouth 2 (two) times daily. 180 tablet 1   chlorhexidine   (PERIDEX ) 0.12 % solution RINSE MOUTH WITH (1 CAPFUL) FOR 30 SECONDS AM AND PM AFTER TOOTHBRUSHING. EXPECTORATE AFTER RINSING, DO NOT  SWALLOW 473 mL 0   chlorhexidine  (PERIOGARD ) 0.12 % solution Rinse mouth with 15 mLs (1 capful) for 30 seconds in the morning and evening after tootbrushing. Spit after rinsing, do not swallow. 473 mL 3   Continuous Glucose Sensor (FREESTYLE LIBRE 3 PLUS SENSOR) MISC Use 1 sensor every 15 days as directed. 6 each 3   Empagliflozin -metFORMIN  HCl (SYNJARDY ) 12.06-998 MG TABS Take 1 tablet by mouth 2 (two) times daily. 180 tablet 3   famotidine  (PEPCID ) 20 MG tablet Take 1 tablet (20 mg total) by mouth 2 (two) times daily. 180 tablet 1   fenofibrate  micronized (LOFIBRA) 200 MG capsule Take 1 capsule (200 mg total) by mouth daily before breakfast. 90 capsule 3   fluticasone  (FLONASE ) 50 MCG/ACT nasal spray Place 2 sprays into both nostrils daily. 16 g 3   ibuprofen  (IBU) 800 MG tablet Take 1 tablet (800 mg total) by mouth every 6 to 8 hours as needed. 20 tablet 0   Insulin  Glargine (BASAGLAR  KWIKPEN) 100 UNIT/ML Inject 40-45 Units into the skin daily. 45 mL 3   Insulin  Glargine (BASAGLAR  KWIKPEN) 100 UNIT/ML Inject 35 Units into the skin daily. 45 mL 3   insulin  glargine (LANTUS ) 100 UNIT/ML Solostar Pen Inject 45 Units into the skin every night. 45 mL 3   Insulin  Pen Needle 29G X 12.7MM MISC Use as directed for insulin  injection. 100 each 3   ipratropium (ATROVENT ) 0.02 % nebulizer solution Take 2.5 mLs (0.5 mg total) by nebulization 4 (four) times daily. 75 mL 12   ipratropium-albuterol  (DUONEB) 0.5-2.5 (3) MG/3ML SOLN Take 3 mL by nebulization every 6 (six) hours as needed for wheezing. 180 mL 0   lamoTRIgine  (LAMICTAL ) 150 MG tablet Take 1 tablet (150 mg total) by mouth 2 (two) times daily. 180 tablet 0   lisinopril  (ZESTRIL ) 10 MG tablet Take 1 tablet (10 mg total) by mouth daily. 90 tablet 1   QUEtiapine  (SEROQUEL ) 200 MG tablet Take 1 tablet (200 mg total) by  mouth at bedtime. 90 tablet 0   Respiratory Therapy Supplies (TUBING/WING TIP) MISC Once daily 1 each 8   rosuvastatin  (CRESTOR ) 40 MG tablet Take 1 tablet (40 mg total) by mouth daily. 90 tablet 3   silver  sulfADIAZINE  (SILVADENE ) 1 % cream Apply daily as needed for a wound or burn. 50 g 0   Sodium Fluoride  (CLINPRO  5000) 1.1 % PSTE Use pea size amount of toothpaste and brush for 2 mins and spit. Do not eat/drink/rinse for 30 mins 100 mL 3   sodium fluoride  (FLUORISHIELD) 1.1 % GEL dental gel Use pea-sized amount and brush for 2 minutes and spit. Do not eat/drink/rinse for 30 minutes. 56 g 2   SYRINGE-NEEDLE, DISP, 3 ML (B-D 3CC LUER-LOK SYR 22GX1) 22G X 1 3 ML MISC Use to inject testosterone  10 each 3   tadalafil  (CIALIS ) 20 MG tablet Take 0.5-1 tablets (10-20 mg total) by mouth daily as needed for erectile dysfunction. 10 tablet 2   testosterone  enanthate (DELATESTRYL ) 200 MG/ML injection Inject 0.5 mLs (100 mg total) into the muscle once a week. 5 mL 5   tirzepatide  (MOUNJARO ) 7.5 MG/0.5ML Pen Inject 7.5 mg into the skin once a week. 6 mL 3   No facility-administered medications prior to visit.    No Known Allergies  Review of Systems  Constitutional:  Negative for fever and malaise/fatigue.  HENT:  Negative for congestion.   Eyes:  Negative for blurred vision.  Respiratory:  Negative for shortness of breath.  Cardiovascular:  Negative for chest pain, palpitations and leg swelling.  Gastrointestinal:  Negative for abdominal pain, blood in stool and nausea.  Genitourinary:  Negative for dysuria and frequency.  Musculoskeletal:  Negative for falls.  Skin:  Negative for rash.  Neurological:  Negative for dizziness, loss of consciousness, weakness and headaches.  Endo/Heme/Allergies:  Negative for environmental allergies.  Psychiatric/Behavioral:  Negative for depression. The patient is not nervous/anxious.        Objective:    Physical Exam Vitals and nursing note reviewed.   Constitutional:      General: He is not in acute distress.    Appearance: Normal appearance. He is well-developed.  HENT:     Head: Normocephalic and atraumatic.  Neck:     Thyroid : No thyromegaly.     Vascular: No JVD.  Pulmonary:     Effort: Pulmonary effort is normal. No respiratory distress.     Breath sounds: Normal breath sounds.  Musculoskeletal:        General: Tenderness present. Normal range of motion.     Right lower leg: No edema.     Left lower leg: No edema.  Skin:    General: Skin is warm and dry.     Findings: No erythema or rash.  Neurological:     Mental Status: He is alert and oriented to person, place, and time.     Cranial Nerves: No cranial nerve deficit.     Motor: No abnormal muscle tone.     Deep Tendon Reflexes: Reflexes are normal and symmetric. Reflexes normal.     Comments: Numbness L hand-- all fingers to just below elbow Tenderness in forearm with palpation  Psychiatric:        Mood and Affect: Mood normal.        Behavior: Behavior normal.        Thought Content: Thought content normal.        Judgment: Judgment normal.     BP (!) 148/90   Pulse 92   Temp (!) 97.5 F (36.4 C)   Resp 16   Ht 6' (1.829 m)   Wt 197 lb (89.4 kg)   SpO2 97%   BMI 26.72 kg/m  Wt Readings from Last 3 Encounters:  02/13/24 197 lb (89.4 kg)  01/08/24 195 lb (88.5 kg)  11/10/23 200 lb (90.7 kg)    Diabetic Foot Exam - Simple   No data filed    Lab Results  Component Value Date   WBC 6.4 10/20/2023   HGB 12.2 (L) 10/20/2023   HCT 36.0 (L) 10/20/2023   PLT 163 10/20/2023   GLUCOSE 173 (H) 10/20/2023   CHOL 78 06/10/2023   TRIG 173.0 (H) 06/10/2023   HDL 20.90 (L) 06/10/2023   LDLDIRECT 58.0 01/04/2021   LDLCALC 23 06/10/2023   ALT 23 10/20/2023   AST 24 10/20/2023   NA 141 10/20/2023   K 4.6 10/20/2023   CL 105 10/20/2023   CREATININE 1.13 10/20/2023   BUN 14 10/20/2023   CO2 23 10/20/2023   TSH 2.89 12/14/2019   PSA 0.49 06/10/2023    HGBA1C 6.8 (A) 09/02/2023   MICROALBUR 0.3 11/01/2022    Lab Results  Component Value Date   TSH 2.89 12/14/2019   Lab Results  Component Value Date   WBC 6.4 10/20/2023   HGB 12.2 (L) 10/20/2023   HCT 36.0 (L) 10/20/2023   MCV 89.6 10/20/2023   PLT 163 10/20/2023   Lab Results  Component Value Date  NA 141 10/20/2023   K 4.6 10/20/2023   CO2 23 10/20/2023   GLUCOSE 173 (H) 10/20/2023   BUN 14 10/20/2023   CREATININE 1.13 10/20/2023   BILITOT 0.3 10/20/2023   ALKPHOS 51 10/20/2023   AST 24 10/20/2023   ALT 23 10/20/2023   PROT 6.4 (L) 10/20/2023   ALBUMIN 4.4 10/20/2023   CALCIUM  9.0 10/20/2023   ANIONGAP 12 10/20/2023   GFR 70.70 06/10/2023   Lab Results  Component Value Date   CHOL 78 06/10/2023   Lab Results  Component Value Date   HDL 20.90 (L) 06/10/2023   Lab Results  Component Value Date   LDLCALC 23 06/10/2023   Lab Results  Component Value Date   TRIG 173.0 (H) 06/10/2023   Lab Results  Component Value Date   CHOLHDL 4 06/10/2023   Lab Results  Component Value Date   HGBA1C 6.8 (A) 09/02/2023       Assessment & Plan:  Numbness and tingling in left arm -     Ambulatory referral to Sports Medicine -     predniSONE ; Take 3 tablets (30 mg total) by mouth daily for 3 days, THEN 2 tablets (20 mg total) daily for 3 days, THEN 1 tablet (10 mg total) daily for 3 days, THEN 0.5 tablets (5 mg total) daily for 3 days.  Dispense: 20 tablet; Refill: 0   Assessment and Plan Assessment & Plan Left upper extremity neuropathy   He experienced acute tingling and numbness in the left hand and arm after a hyperextension injury at the gym. Symptoms include tingling, numbness, soreness, and occasional cold sensation, without significant swelling or bruising. The differential diagnosis includes nerve compression or irritation, possibly involving the carpal tunnel or other nerve pathways. Ibuprofen  was ineffective. Prednisone  is considered, but there are concerns  about blood sugar levels due to his diabetes. He is willing to try prednisone  with insulin  regimen adjustments. A splint was provided for the left hand to maintain a neutral position and reduce nerve pressure. Prednisone  was prescribed with instructions to adjust insulin  dosage if blood sugar levels increase. He is referred to sports medicine for further evaluation if symptoms persist after the weekend.  Type 2 diabetes mellitus   His recent A1c is 6.7-6.9, and the current blood sugar level is 152 mg/dL. He is on a long-acting insulin  regimen and is experienced in managing blood sugar fluctuations. Prednisone  may temporarily increase blood sugar levels. Adjust insulin  dosage from 35 units to 40 units if blood sugar levels increase due to prednisone .   Raju Coppolino R Lowne Chase, DO

## 2024-02-13 NOTE — Telephone Encounter (Signed)
 Appt scheduled

## 2024-02-17 ENCOUNTER — Other Ambulatory Visit (HOSPITAL_BASED_OUTPATIENT_CLINIC_OR_DEPARTMENT_OTHER): Payer: Self-pay

## 2024-02-17 ENCOUNTER — Other Ambulatory Visit: Payer: Self-pay

## 2024-02-17 ENCOUNTER — Ambulatory Visit

## 2024-02-17 VITALS — BP 130/80 | Ht 72.0 in | Wt 197.0 lb

## 2024-02-17 DIAGNOSIS — R200 Anesthesia of skin: Secondary | ICD-10-CM

## 2024-02-17 DIAGNOSIS — S4492XA Injury of unspecified nerve at shoulder and upper arm level, left arm, initial encounter: Secondary | ICD-10-CM | POA: Diagnosis not present

## 2024-02-17 MED ORDER — PREDNISONE 10 MG PO TABS
ORAL_TABLET | ORAL | 0 refills | Status: AC
  Filled 2024-02-17 (×3): qty 18, 7d supply, fill #0
  Filled ????-??-??: fill #0

## 2024-02-17 NOTE — Progress Notes (Signed)
 "  Subjective:    Patient ID: William [name], male    DOB: 64 y.o., 09-04-1960   MRN: [id number]  Chief Complaint: Left arm numbness/tingling  History of Present Illness 64 year old male with past medical history significant for type 2 diabetes, hypertension, anxiety, depression presenting for evaluation of left arm numbness/tingling.  Reports that on 1/9 while in the gym doing an upright row type motion exercise he felt a pop in his left elbow and since that has had numbness/tingling in his left upper extremity.  Was evaluated by his primary care physician that day who prescribed a prednisone  Dosepak which has provided mild but incomplete relief.  Left Forearm Pain and Sensory Disturbance: - Developed symptoms after releasing a weight machine too quickly, experiencing a slight rip sensation in the left forearm (localized to the forearm, not the elbow), without an audible pop - Persistent left forearm soreness with radiation from the elbow into the hand - Intermittent numbness and tingling involving the entire left hand and all fingers, worse at night and disrupting sleep - Forearm tightness and occasional swelling - Focal pressure point of tenderness in the forearm - Intermittent bruising in the forearm - Cold sensation in the left hand - Pain does not radiate above the elbow - Mild soreness with certain wrist and hand movements - Quick jolt of tingling with repetitive motion - Decreased left-hand grip strength, especially when gripping a steering wheel - Mild aching over the dorsum of the hand with forceful use - No new trauma  Symptom Management and Response to Treatment: - Uses prednisone  as needed, providing 6-8 hours of relief (last dose at 5:00 AM today with temporary resolution of symptoms) - Wrist brace available but rarely used due to discomfort and lack of clear benefit - Stretching and hand exercises provide brief relief  Post-Surgical and Prior Elbow Symptoms: - History of  left lateral epicondylitis treated with aspiration, steroid injection, and surgery - Elbow currently only slightly sore with pressure - No current elbow pain  Most Bothersome Symptom: - Numbness and tingling in the left hand and fingers are the most bothersome symptoms   Review of Pertinent Imaging: None available currently in our system.    Objective:   Vitals:   02/17/24 1326  BP: 130/80   Left forearm/wrist/elbow: Scattered areas of tenderness to palpation and muscle bellies including the volar/mid/radial forearm, the volar/proximal/ulnar forearm.  Scant bruising present over the volar wrist Nontender to palpation over the medial epicondyle, lateral epicondyles, biceps tendon, triceps tendon, radial styloid, ulnar styloid. 5/5 strength with wrist extension, wrist flexion, radial deviation, ulnar deviation. 5/5 strength with supination/pronation, elbow flexion/extension Negative Tinel's, negative Durkan's/Phalen's, negative piano key   Limited ultrasound evaluation of the left upper extremity: There is a tiny hypoechogenic fissure present within the body of the flexor carpi radialis tendon near its distal insertion point. The tendons of the volar wrist are well-visualized and appear normal. Musculature of the forearm is well-visualized and appears normal. There is no Martin-Gruber anastomosis between the median and ulnar nerves. Interpretation: Tiny longitudinal split tear of flexor carpi radialis tendon Otherwise normal ultrasound of the left wrist/forearm/elbow  Limited ultrasound evaluation of the right upper extremity: The common extensor tendon of the right elbow is well-visualized and appears to carry a large hyperechogenic focus present within its body near its insertion onto the lateral condyle. 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  The numbness and tingling character of this makes me more concerned for a transient neurapraxia which is likely to improve with a short anti-inflammatory course + time, though faint snap in his mid forearm does raise some suspicion for intersection syndrome (though I cannot reproduce this on exam).  Pronator syndrome also possible, though pronator teres muscle normal on ultrasound exam today.  At this point, given inconsistent initial dosing of prednisone  previously prescribed by PCP, I will provide new prescription clearly demarcating a 5-day course + 2-day taper.  Given that the neuropathic type pain is the most bothersome component of his current problem, I fitted him for a wrist cock up splint to minimize pressure through the carpal tunnel and recommended nocturnal use.  Follow-up in 2 weeks to reassess.   45 minutes time was spent in face-to-face discussion with the patient regarding his onset of symptoms, the findings of his ultrasound images, discussing treatment options, and documenting the aforementioned in the patient's medical record on the same day of the encounter. "

## 2024-03-02 ENCOUNTER — Ambulatory Visit

## 2024-04-19 ENCOUNTER — Ambulatory Visit: Admitting: Urology

## 2024-10-18 ENCOUNTER — Ambulatory Visit: Admitting: Medical Oncology
